# Patient Record
Sex: Male | Born: 1935 | Race: White | Hispanic: No | Marital: Married | Smoking: Never smoker
Health system: Southern US, Community
[De-identification: ages and names within clinical notes are randomized; demographics above are authoritative.]

## PROBLEM LIST (undated history)

## (undated) DIAGNOSIS — N400 Enlarged prostate without lower urinary tract symptoms: Secondary | ICD-10-CM

## (undated) DIAGNOSIS — K219 Gastro-esophageal reflux disease without esophagitis: Secondary | ICD-10-CM

## (undated) DIAGNOSIS — E785 Hyperlipidemia, unspecified: Secondary | ICD-10-CM

## (undated) DIAGNOSIS — R600 Localized edema: Secondary | ICD-10-CM

## (undated) DIAGNOSIS — Z889 Allergy status to unspecified drugs, medicaments and biological substances status: Secondary | ICD-10-CM

## (undated) DIAGNOSIS — M419 Scoliosis, unspecified: Secondary | ICD-10-CM

## (undated) DIAGNOSIS — I10 Essential (primary) hypertension: Secondary | ICD-10-CM

## (undated) DIAGNOSIS — R609 Edema, unspecified: Secondary | ICD-10-CM

## (undated) DIAGNOSIS — G4700 Insomnia, unspecified: Secondary | ICD-10-CM

## (undated) DIAGNOSIS — I509 Heart failure, unspecified: Secondary | ICD-10-CM

## (undated) DIAGNOSIS — E039 Hypothyroidism, unspecified: Secondary | ICD-10-CM

## (undated) DIAGNOSIS — F419 Anxiety disorder, unspecified: Secondary | ICD-10-CM

## (undated) DIAGNOSIS — C801 Malignant (primary) neoplasm, unspecified: Secondary | ICD-10-CM

## (undated) DIAGNOSIS — M549 Dorsalgia, unspecified: Secondary | ICD-10-CM

## (undated) DIAGNOSIS — F0390 Unspecified dementia without behavioral disturbance: Secondary | ICD-10-CM

## (undated) DIAGNOSIS — Z22322 Carrier or suspected carrier of Methicillin resistant Staphylococcus aureus: Secondary | ICD-10-CM

## (undated) HISTORY — PX: OTHER SURGICAL HISTORY: SHX169

## (undated) HISTORY — DX: Heart failure, unspecified: I50.9

## (undated) HISTORY — PX: TRANSURETHRAL RESECTION OF PROSTATE: SHX73

## (undated) HISTORY — PX: LUMBAR DISC SURGERY: SHX700

## (undated) HISTORY — DX: Unspecified dementia, unspecified severity, without behavioral disturbance, psychotic disturbance, mood disturbance, and anxiety: F03.90

## (undated) HISTORY — DX: Malignant (primary) neoplasm, unspecified: C80.1

## (undated) HISTORY — DX: Carrier or suspected carrier of methicillin resistant Staphylococcus aureus: Z22.322

## (undated) HISTORY — PX: TOE SURGERY: SHX1073

## (undated) HISTORY — PX: HERNIA REPAIR: SHX51

## (undated) HISTORY — DX: Dorsalgia, unspecified: M54.9

## (undated) HISTORY — PX: COLONOSCOPY: SHX174

## (undated) HISTORY — PX: CATARACT EXTRACTION: SUR2

## (undated) HISTORY — DX: Scoliosis, unspecified: M41.9

## (undated) HISTORY — DX: Hyperlipidemia, unspecified: E78.5

## (undated) HISTORY — PX: CERVICAL DISC SURGERY: SHX588

## (undated) HISTORY — PX: ESOPHAGOGASTRODUODENOSCOPY: SHX1529

---

## 1998-01-16 ENCOUNTER — Other Ambulatory Visit: Admission: RE | Admit: 1998-01-16 | Discharge: 1998-01-16 | Payer: Self-pay | Admitting: *Deleted

## 1999-06-12 ENCOUNTER — Encounter: Payer: Self-pay | Admitting: Neurological Surgery

## 1999-06-12 ENCOUNTER — Ambulatory Visit (HOSPITAL_COMMUNITY): Admission: RE | Admit: 1999-06-12 | Discharge: 1999-06-12 | Payer: Self-pay | Admitting: Neurological Surgery

## 1999-07-17 ENCOUNTER — Inpatient Hospital Stay (HOSPITAL_COMMUNITY): Admission: RE | Admit: 1999-07-17 | Discharge: 1999-07-21 | Payer: Self-pay | Admitting: Neurological Surgery

## 1999-07-17 ENCOUNTER — Encounter: Payer: Self-pay | Admitting: Neurological Surgery

## 1999-07-19 ENCOUNTER — Encounter: Payer: Self-pay | Admitting: Neurological Surgery

## 1999-08-05 ENCOUNTER — Encounter: Payer: Self-pay | Admitting: Neurological Surgery

## 1999-08-05 ENCOUNTER — Encounter: Admission: RE | Admit: 1999-08-05 | Discharge: 1999-08-05 | Payer: Self-pay | Admitting: Neurological Surgery

## 1999-08-06 ENCOUNTER — Encounter: Admission: RE | Admit: 1999-08-06 | Discharge: 1999-08-06 | Payer: Self-pay | Admitting: Neurological Surgery

## 1999-08-06 ENCOUNTER — Encounter: Payer: Self-pay | Admitting: Neurological Surgery

## 1999-08-12 ENCOUNTER — Encounter: Payer: Self-pay | Admitting: Neurological Surgery

## 1999-08-12 ENCOUNTER — Encounter: Admission: RE | Admit: 1999-08-12 | Discharge: 1999-08-12 | Payer: Self-pay | Admitting: Neurological Surgery

## 1999-08-20 ENCOUNTER — Encounter: Payer: Self-pay | Admitting: Neurological Surgery

## 1999-08-20 ENCOUNTER — Encounter: Admission: RE | Admit: 1999-08-20 | Discharge: 1999-08-20 | Payer: Self-pay | Admitting: Neurological Surgery

## 1999-09-10 ENCOUNTER — Encounter: Admission: RE | Admit: 1999-09-10 | Discharge: 1999-09-10 | Payer: Self-pay | Admitting: Neurological Surgery

## 1999-09-10 ENCOUNTER — Encounter: Payer: Self-pay | Admitting: Neurological Surgery

## 1999-10-15 ENCOUNTER — Encounter: Payer: Self-pay | Admitting: Neurological Surgery

## 1999-10-15 ENCOUNTER — Encounter: Admission: RE | Admit: 1999-10-15 | Discharge: 1999-10-15 | Payer: Self-pay | Admitting: Neurological Surgery

## 1999-11-03 ENCOUNTER — Encounter (INDEPENDENT_AMBULATORY_CARE_PROVIDER_SITE_OTHER): Payer: Self-pay | Admitting: Specialist

## 1999-11-03 ENCOUNTER — Ambulatory Visit (HOSPITAL_BASED_OUTPATIENT_CLINIC_OR_DEPARTMENT_OTHER): Admission: RE | Admit: 1999-11-03 | Discharge: 1999-11-03 | Payer: Self-pay

## 2000-01-26 ENCOUNTER — Encounter: Payer: Self-pay | Admitting: *Deleted

## 2000-01-26 ENCOUNTER — Encounter: Admission: RE | Admit: 2000-01-26 | Discharge: 2000-01-26 | Payer: Self-pay | Admitting: *Deleted

## 2000-01-29 ENCOUNTER — Encounter: Payer: Self-pay | Admitting: Neurological Surgery

## 2000-01-29 ENCOUNTER — Ambulatory Visit (HOSPITAL_COMMUNITY): Admission: RE | Admit: 2000-01-29 | Discharge: 2000-01-29 | Payer: Self-pay | Admitting: Neurological Surgery

## 2000-02-10 ENCOUNTER — Encounter: Payer: Self-pay | Admitting: Neurological Surgery

## 2000-02-10 ENCOUNTER — Ambulatory Visit (HOSPITAL_COMMUNITY): Admission: RE | Admit: 2000-02-10 | Discharge: 2000-02-10 | Payer: Self-pay | Admitting: Neurological Surgery

## 2000-02-17 ENCOUNTER — Encounter: Payer: Self-pay | Admitting: Neurological Surgery

## 2000-02-17 ENCOUNTER — Ambulatory Visit (HOSPITAL_COMMUNITY): Admission: RE | Admit: 2000-02-17 | Discharge: 2000-02-17 | Payer: Self-pay | Admitting: Neurological Surgery

## 2000-10-07 ENCOUNTER — Ambulatory Visit (HOSPITAL_COMMUNITY): Admission: RE | Admit: 2000-10-07 | Discharge: 2000-10-07 | Payer: Self-pay | Admitting: Family Medicine

## 2001-02-09 ENCOUNTER — Encounter: Payer: Self-pay | Admitting: Neurological Surgery

## 2001-02-09 ENCOUNTER — Encounter: Admission: RE | Admit: 2001-02-09 | Discharge: 2001-02-09 | Payer: Self-pay | Admitting: Neurological Surgery

## 2001-02-23 ENCOUNTER — Encounter: Admission: RE | Admit: 2001-02-23 | Discharge: 2001-02-23 | Payer: Self-pay | Admitting: Neurological Surgery

## 2001-02-23 ENCOUNTER — Encounter: Payer: Self-pay | Admitting: Neurological Surgery

## 2001-03-10 ENCOUNTER — Encounter: Payer: Self-pay | Admitting: Neurological Surgery

## 2001-03-10 ENCOUNTER — Encounter: Admission: RE | Admit: 2001-03-10 | Discharge: 2001-03-10 | Payer: Self-pay | Admitting: Neurological Surgery

## 2003-02-09 ENCOUNTER — Ambulatory Visit (HOSPITAL_COMMUNITY): Admission: RE | Admit: 2003-02-09 | Discharge: 2003-02-09 | Payer: Self-pay | Admitting: Neurology

## 2003-02-09 ENCOUNTER — Encounter: Payer: Self-pay | Admitting: Neurology

## 2004-08-19 ENCOUNTER — Ambulatory Visit: Payer: Self-pay | Admitting: Family Medicine

## 2004-09-16 ENCOUNTER — Ambulatory Visit: Payer: Self-pay | Admitting: Physical Medicine & Rehabilitation

## 2005-04-01 ENCOUNTER — Ambulatory Visit: Payer: Self-pay | Admitting: Family Medicine

## 2005-12-14 ENCOUNTER — Ambulatory Visit: Payer: Self-pay | Admitting: Family Medicine

## 2005-12-15 ENCOUNTER — Encounter: Payer: Self-pay | Admitting: Family Medicine

## 2005-12-15 LAB — CONVERTED CEMR LAB: PSA: 1.51 ng/mL

## 2005-12-17 ENCOUNTER — Encounter: Admission: RE | Admit: 2005-12-17 | Discharge: 2005-12-17 | Payer: Self-pay | Admitting: Family Medicine

## 2005-12-17 ENCOUNTER — Ambulatory Visit: Payer: Self-pay | Admitting: Family Medicine

## 2005-12-24 ENCOUNTER — Ambulatory Visit: Payer: Self-pay | Admitting: Family Medicine

## 2005-12-31 ENCOUNTER — Ambulatory Visit: Payer: Self-pay | Admitting: Family Medicine

## 2006-01-28 ENCOUNTER — Ambulatory Visit: Payer: Self-pay | Admitting: Family Medicine

## 2006-02-18 ENCOUNTER — Ambulatory Visit: Payer: Self-pay | Admitting: Family Medicine

## 2006-07-13 ENCOUNTER — Ambulatory Visit: Payer: Self-pay | Admitting: Family Medicine

## 2006-07-27 ENCOUNTER — Ambulatory Visit: Payer: Self-pay | Admitting: Family Medicine

## 2006-07-27 LAB — CONVERTED CEMR LAB
Creatinine, Ser: 1.3 mg/dL (ref 0.4–1.5)
Potassium: 4 meq/L (ref 3.5–5.1)
Uric Acid, Serum: 8.2 mg/dL — ABNORMAL HIGH (ref 2.4–7.0)

## 2006-08-30 ENCOUNTER — Ambulatory Visit: Payer: Self-pay | Admitting: Family Medicine

## 2006-09-13 ENCOUNTER — Ambulatory Visit: Payer: Self-pay | Admitting: Family Medicine

## 2007-04-07 ENCOUNTER — Encounter: Payer: Self-pay | Admitting: Family Medicine

## 2007-04-07 DIAGNOSIS — E785 Hyperlipidemia, unspecified: Secondary | ICD-10-CM

## 2007-04-07 DIAGNOSIS — I10 Essential (primary) hypertension: Secondary | ICD-10-CM

## 2007-04-07 DIAGNOSIS — E039 Hypothyroidism, unspecified: Secondary | ICD-10-CM

## 2007-04-07 DIAGNOSIS — I509 Heart failure, unspecified: Secondary | ICD-10-CM

## 2007-08-02 ENCOUNTER — Ambulatory Visit: Payer: Self-pay | Admitting: Family Medicine

## 2007-08-02 DIAGNOSIS — R413 Other amnesia: Secondary | ICD-10-CM

## 2007-08-02 LAB — CONVERTED CEMR LAB
Albumin: 4 g/dL (ref 3.5–5.2)
Alkaline Phosphatase: 63 units/L (ref 39–117)
Basophils Relative: 0.4 % (ref 0.0–1.0)
CO2: 29 meq/L (ref 19–32)
GFR calc Af Amer: 83 mL/min
HCT: 45.8 % (ref 39.0–52.0)
Hemoglobin: 15.9 g/dL (ref 13.0–17.0)
Lymphocytes Relative: 36.2 % (ref 12.0–46.0)
MCHC: 34.8 g/dL (ref 30.0–36.0)
MCV: 89 fL (ref 78.0–100.0)
Monocytes Relative: 6.6 % (ref 3.0–11.0)
Platelets: 217 10*3/uL (ref 150–400)
Potassium: 4.5 meq/L (ref 3.5–5.1)
RDW: 12.3 % (ref 11.5–14.6)
TSH: 0.84 microintl units/mL (ref 0.35–5.50)
Total Bilirubin: 1.4 mg/dL — ABNORMAL HIGH (ref 0.3–1.2)
WBC: 8 10*3/uL (ref 4.5–10.5)

## 2007-08-11 ENCOUNTER — Ambulatory Visit: Payer: Self-pay | Admitting: Family Medicine

## 2007-08-11 DIAGNOSIS — L259 Unspecified contact dermatitis, unspecified cause: Secondary | ICD-10-CM

## 2007-08-30 ENCOUNTER — Ambulatory Visit: Payer: Self-pay | Admitting: Family Medicine

## 2008-02-10 ENCOUNTER — Ambulatory Visit: Payer: Self-pay | Admitting: Family Medicine

## 2008-02-16 ENCOUNTER — Telehealth (INDEPENDENT_AMBULATORY_CARE_PROVIDER_SITE_OTHER): Payer: Self-pay | Admitting: *Deleted

## 2008-03-13 ENCOUNTER — Ambulatory Visit: Payer: Self-pay | Admitting: Family Medicine

## 2008-04-23 ENCOUNTER — Ambulatory Visit: Payer: Self-pay | Admitting: Family Medicine

## 2008-06-25 ENCOUNTER — Ambulatory Visit: Payer: Self-pay | Admitting: Family Medicine

## 2008-08-21 ENCOUNTER — Ambulatory Visit: Payer: Self-pay | Admitting: Family Medicine

## 2008-08-22 LAB — CONVERTED CEMR LAB
ALT: 28 units/L (ref 0–53)
AST: 24 units/L (ref 0–37)
Albumin: 3.8 g/dL (ref 3.5–5.2)
Alkaline Phosphatase: 57 units/L (ref 39–117)
Basophils Absolute: 0 10*3/uL (ref 0.0–0.1)
Basophils Relative: 0.6 % (ref 0.0–3.0)
Eosinophils Relative: 1.5 % (ref 0.0–5.0)
GFR calc Af Amer: 83 mL/min
GFR calc non Af Amer: 69 mL/min
HCT: 45.1 % (ref 39.0–52.0)
Hemoglobin: 15.7 g/dL (ref 13.0–17.0)
LDL Cholesterol: 135 mg/dL — ABNORMAL HIGH (ref 0–99)
Monocytes Absolute: 0.5 10*3/uL (ref 0.1–1.0)
Neutrophils Relative %: 56.2 % (ref 43.0–77.0)
RBC: 4.91 M/uL (ref 4.22–5.81)
Sodium: 145 meq/L (ref 135–145)
TSH: 0.46 microintl units/mL (ref 0.35–5.50)
Triglycerides: 157 mg/dL — ABNORMAL HIGH (ref 0–149)
WBC: 6.4 10*3/uL (ref 4.5–10.5)

## 2008-11-15 DIAGNOSIS — J069 Acute upper respiratory infection, unspecified: Secondary | ICD-10-CM | POA: Insufficient documentation

## 2008-11-22 ENCOUNTER — Ambulatory Visit: Payer: Self-pay | Admitting: Family Medicine

## 2008-11-30 ENCOUNTER — Ambulatory Visit: Payer: Self-pay | Admitting: Family Medicine

## 2008-12-10 ENCOUNTER — Ambulatory Visit: Payer: Self-pay | Admitting: Family Medicine

## 2008-12-10 DIAGNOSIS — J309 Allergic rhinitis, unspecified: Secondary | ICD-10-CM | POA: Insufficient documentation

## 2009-01-08 ENCOUNTER — Ambulatory Visit: Payer: Self-pay | Admitting: Family Medicine

## 2009-09-27 ENCOUNTER — Ambulatory Visit: Payer: Self-pay | Admitting: Family Medicine

## 2010-12-13 ENCOUNTER — Other Ambulatory Visit: Payer: Self-pay | Admitting: Family Medicine

## 2011-01-30 ENCOUNTER — Other Ambulatory Visit: Payer: Self-pay | Admitting: Family Medicine

## 2011-02-27 NOTE — Op Note (Signed)
[place]. [facility]  Patient:    Wayne [name]                         MRN: [id number] Proc. Date: 11/03/99 Adm. Date:  [id number] Disc. Date: [id number] Attending:  Harriet Masson                           Operative Report  PREOPERATIVE DIAGNOSIS:  Hallux rigidus/degenerative arthritis - first metatarsal phalangeal joint right foot.  POSTOPERATIVE DIAGNOSIS:  Hallux rigidus/degenerative arthritis - first metatarsal phalangeal joint right foot.  OPERATIVE PROCEDURE:  Wayne [name] bunionectomy with implant arthroplasty ______ Bioaction implant first metatarsal phalangeal joint right foot.  SURGEON:  Harriet Masson, D.P.M.  INDICATIONS:  Patient has a several year history of increasing pain, tenderness in severity and limitation of motion of the first MPJ right.  The patient has sketchy history of injury or trauma to the area.  There was limited motion of the first  MPJ. X-rays confirm asymmetric joint space narrowing, periarticular spurring, absence of clinical or radiographic mobility and at patient request at this time, surgical intervention was carried out in hopes of relieving pain and symptoms and improving motion at the first MPJ right foot.  There were no contraindications o surgery noted at the present time.  ANESTHESIA:  MAC with local anesthetic administered total of approximately 10 cc, 50/50 mixture of 2% Xylocaine plain and 0.5% Marcaine plain in a nail block fashion.  HEMOSTASIS:  Right ankle tourniquet at 250 mmHg.  Intraoperatively, the tourniquet apparently released or was relaxed, possibly loose around the ankle and hemostasis was lost approximately 2/3 of way prior to closure and the procedure was completed without hemostasis for the last approximately 10 minutes.  FINDINGS OF PROCEDURE:   The patient was brought to the OR and placed on the table in a supine position.  IV sedation was established, the foot was then  prepped and draped in usual aseptic manner.  Ankle tourniquet placed above the malleoli and  padded well for ______.  The foot was exsanguinated with Esmarch wrap and ankle  tourniquet inflated to 250 mmHg.  The following procedure was then carried out.   Keller bunionectomy with implant right foot first MPJ.  Attention was directed overlying the first metatarsal phalangeal joint right foot, where approximately a 6-7 cm linear incision was carried out just medial to and paralleling longus tensor tendon.  The incision was deepened using sharp and blunt dissection along the capsular structures.  At that point, linear capsular incision was carried out the entire length of the original incisions.  Capsular tissues were reflected medially and laterally.  There was a considerable amount of hypertrophied bone in joint mice or ossicle was identified in and around the periarticular area of the first MPJ area.  These were resected from the site.  All bone specimens were submitted in formalin for pathology.  At this time, the joint was disarticulated.  There was  almost complete absence of articular surface, both in the phalangeal base and the metatarsal head.  At this time, utilizing power instrumentation, the metatarsal  head was resected perpendicular to its shaft.  All rough edges were smoothed. t this time, attention was directed to the base of the proximal phalanx, which was also resected perpendicular to its shaft.  Any hypertrophied or bone spicules were resected with rongeur.  The metatarsal head and the phalangeal base were remodeled appropriately.  At this time utilizing appropriate sizers and templates. Templates demonstrated that a large metatarsal head and large phalangeal modified implant  would be appropriate.  At this time, utilizing the supplied reamers with the Bioaction implant set and rotary osteotome or bur, the medullary canals were each appropriately reamed  at the metatarsal head and at the phalangeal base aspects.  Temporary sizers were installed and noted to fit adequately at this time. Large neutral metatarsal implant was applied to the metatarsal head and a large modified phalangeal base was also applied to the phalanx.  All the surrounding hypertrophied bone was resected.  McGlamary elevators were utilized to mobilize the sesamoids, which were also noted to be adhered intraoperatively.  Prior to closure, the site was lavaged with copious amounts of sterile antibiotic solution and cleared of ll osseus and soft tissue debris.  Fluoroscopy pictures were taken to verify positions of the implant and range of motion, which was noted to be adequate, approximately 75 degrees dorsiflexion was available on the operative table and 15-20 degrees plantar flexion upon closure.  Dry, sterile compressive dressing was applied.  should note that the last few minutes of closure were done with an apparently released or drop in tourniquet pressure or at least loosening of the tourniquet  around the patients ankle.  No other complications were noted at this time and closure was accomplished utilizing 3-0 Vicryl to reapproximate capsular tissues, 4-0 Vicryl to reapproximate subcu tissues and skin reapproximated with 5-0 Vicryl in a subcuticular fashion.  Upon completion, the site was infiltrated with 1 cc of dexamethasone phosphate, Betadine/saline soaked sponge and dry, sterile compressive dressing was applied to the right foot.  At that time, the ankle tourniquet, although already deflated, tourniquet was completely deflated, patient had capillary refill.  Time was already noted intraoperatively and at this time, the patient was returned from the OR to recovery in satisfactory condition with vital signs stable and capillary refill time immediate.  Patient was discharged from recovery per anesthesia with oral and written postoperative  instructions, prescriptions for pain medication and appointment for follow-up office visit having been made.  Patient will be in a Darco shoe with moderate ambulation, no other restrictions noted at this time.  Patient will be followed in the [facility]  within one weeks time for postoperative check and dressing changes. DD:  12/25/99 TD:  12/25/99 Job: [id number]

## 2011-06-19 ENCOUNTER — Ambulatory Visit (INDEPENDENT_AMBULATORY_CARE_PROVIDER_SITE_OTHER): Payer: Medicare Other | Admitting: Family Medicine

## 2011-06-19 ENCOUNTER — Encounter: Payer: Self-pay | Admitting: Family Medicine

## 2011-06-19 ENCOUNTER — Other Ambulatory Visit: Payer: Self-pay | Admitting: Family Medicine

## 2011-06-19 ENCOUNTER — Ambulatory Visit: Payer: Self-pay | Admitting: Family Medicine

## 2011-06-19 DIAGNOSIS — E039 Hypothyroidism, unspecified: Secondary | ICD-10-CM

## 2011-06-19 DIAGNOSIS — F09 Unspecified mental disorder due to known physiological condition: Secondary | ICD-10-CM

## 2011-06-19 DIAGNOSIS — Z0000 Encounter for general adult medical examination without abnormal findings: Secondary | ICD-10-CM

## 2011-06-19 DIAGNOSIS — R5383 Other fatigue: Secondary | ICD-10-CM

## 2011-06-19 DIAGNOSIS — I10 Essential (primary) hypertension: Secondary | ICD-10-CM

## 2011-06-19 DIAGNOSIS — N400 Enlarged prostate without lower urinary tract symptoms: Secondary | ICD-10-CM

## 2011-06-19 DIAGNOSIS — R4189 Other symptoms and signs involving cognitive functions and awareness: Secondary | ICD-10-CM

## 2011-06-19 DIAGNOSIS — E785 Hyperlipidemia, unspecified: Secondary | ICD-10-CM

## 2011-06-19 LAB — LIPID PANEL
Cholesterol: 199 mg/dL (ref 0–200)
Total CHOL/HDL Ratio: 5
VLDL: 67.4 mg/dL — ABNORMAL HIGH (ref 0.0–40.0)

## 2011-06-19 LAB — BASIC METABOLIC PANEL
Chloride: 105 mEq/L (ref 96–112)
Glucose, Bld: 98 mg/dL (ref 70–99)
Sodium: 140 mEq/L (ref 135–145)

## 2011-06-19 LAB — CBC WITH DIFFERENTIAL/PLATELET
Basophils Relative: 0.6 % (ref 0.0–3.0)
Eosinophils Absolute: 0.2 10*3/uL (ref 0.0–0.7)
Lymphocytes Relative: 28.2 % (ref 12.0–46.0)
MCV: 92.3 fl (ref 78.0–100.0)
Monocytes Relative: 7.5 % (ref 3.0–12.0)
Neutrophils Relative %: 61.7 % (ref 43.0–77.0)
Platelets: 230 10*3/uL (ref 150.0–400.0)

## 2011-06-19 LAB — HEPATIC FUNCTION PANEL
Albumin: 4.1 g/dL (ref 3.5–5.2)
Alkaline Phosphatase: 54 U/L (ref 39–117)
Bilirubin, Direct: 0 mg/dL (ref 0.0–0.3)
Total Protein: 7.2 g/dL (ref 6.0–8.3)

## 2011-06-19 LAB — TSH: TSH: 13.81 u[IU]/mL — ABNORMAL HIGH (ref 0.35–5.50)

## 2011-06-19 LAB — LDL CHOLESTEROL, DIRECT: Direct LDL: 114.8 mg/dL

## 2011-06-19 NOTE — Progress Notes (Signed)
  Subjective:    Patient ID: Wayne [name], male    DOB: Sep 11, 1926, 77 y.o.   MRN: [id number]  HPI Seen with nonspecific symptoms of not feeling well past few weeks. Some recent increased edema ankles. He has some chronic edema. No history of heart failure. Also has chronic back pain and has had multiple surgeries previously and has had some recent flareup. He is scheduled to see Dr. Ellene Route. He is taking Aricept for some memory issues but thought this was causing constipation. The constipation has persisted. He complains of decreased energy, peripheral edema and constipation as well as increased fatigue. In reviewing his medications, he stopped his thyroid medication for unknown reasons several months ago. He has not had any lab work in quite some time. Denies any recent chest pain. No appetite changes. Chronic prostate issues and does in and out catheter occasionally. No dysuria.   Review of Systems  Constitutional: Positive for fatigue. Negative for fever, chills and appetite change.  Eyes: Negative for visual disturbance.  Respiratory: Negative for cough, shortness of breath and wheezing.   Cardiovascular: Positive for leg swelling. Negative for chest pain and palpitations.  Gastrointestinal: Negative for abdominal pain and blood in stool.  Genitourinary: Negative for hematuria.  Neurological: Positive for weakness. Negative for syncope and headaches.  Hematological: Negative for adenopathy. Does not bruise/bleed easily.       Objective:   Physical Exam  Constitutional: He is oriented to person, place, and time. He appears well-developed and well-nourished. No distress.  HENT:  Head: Normocephalic and atraumatic.  Nose: Nose normal.  Mouth/Throat: Oropharynx is clear and moist.  Neck: Neck supple.  Cardiovascular: Normal rate and regular rhythm.   Pulmonary/Chest: Effort normal and breath sounds normal. No respiratory distress. He has no wheezes. He has no rales.  Musculoskeletal:       Trace to 1+ pitting edema legs bilaterally  Lymphadenopathy:    He has no cervical adenopathy.  Neurological: He is alert and oriented to person, place, and time.          Assessment & Plan:  #1 Fatigue. Probably multifactorial. Possibly related to not taking thyroid medication #2 hypothyroidism. Poor compliance. Recheck TSH. This may explain several his symptoms above #3 hyperlipidemia. Check lipids and hepatic panel #4 hypertension. Elevated today somewhat. Followup with primary reassess in 2 weeks

## 2011-06-24 ENCOUNTER — Telehealth: Payer: Self-pay | Admitting: Family Medicine

## 2011-06-24 MED ORDER — LEVOTHYROXINE SODIUM 50 MCG PO TABS: 50.0000 ug | ORAL_TABLET | Freq: Every day | ORAL | Status: DC

## 2011-06-24 NOTE — Telephone Encounter (Signed)
patient  Is aware of lab results.  rx sent to pharmacy

## 2011-06-24 NOTE — Telephone Encounter (Signed)
Pt had bloodwork on 9-7 requesting results.

## 2011-06-29 ENCOUNTER — Ambulatory Visit (INDEPENDENT_AMBULATORY_CARE_PROVIDER_SITE_OTHER): Payer: Medicare Other | Admitting: Family Medicine

## 2011-06-29 ENCOUNTER — Encounter: Payer: Self-pay | Admitting: Family Medicine

## 2011-06-29 VITALS — BP 180/98 | Temp 98.2°F | Wt 204.0 lb

## 2011-06-29 DIAGNOSIS — N400 Enlarged prostate without lower urinary tract symptoms: Secondary | ICD-10-CM

## 2011-06-29 DIAGNOSIS — Z23 Encounter for immunization: Secondary | ICD-10-CM

## 2011-06-29 DIAGNOSIS — F068 Other specified mental disorders due to known physiological condition: Secondary | ICD-10-CM

## 2011-06-29 DIAGNOSIS — I10 Essential (primary) hypertension: Secondary | ICD-10-CM

## 2011-06-29 DIAGNOSIS — E039 Hypothyroidism, unspecified: Secondary | ICD-10-CM

## 2011-06-29 DIAGNOSIS — E785 Hyperlipidemia, unspecified: Secondary | ICD-10-CM

## 2011-06-29 DIAGNOSIS — R4189 Other symptoms and signs involving cognitive functions and awareness: Secondary | ICD-10-CM

## 2011-06-29 MED ORDER — DONEPEZIL HCL 5 MG PO TABS: 5.0000 mg | ORAL_TABLET | Freq: Every evening | ORAL | Status: DC | PRN

## 2011-06-29 MED ORDER — ATENOLOL 100 MG PO TABS: 100.0000 mg | ORAL_TABLET | Freq: Every day | ORAL | Status: DC

## 2011-06-29 MED ORDER — CIPROFLOXACIN HCL 500 MG PO TABS: 500.0000 mg | ORAL_TABLET | Freq: Every day | ORAL | Status: AC

## 2011-06-29 MED ORDER — DOXAZOSIN MESYLATE 4 MG PO TABS: 4.0000 mg | ORAL_TABLET | Freq: Every day | ORAL | Status: DC

## 2011-06-29 MED ORDER — TORSEMIDE 20 MG PO TABS: 20.0000 mg | ORAL_TABLET | Freq: Every day | ORAL | Status: DC

## 2011-06-29 NOTE — Progress Notes (Signed)
  Subjective:    Patient ID: Wayne [name], male    DOB: 02/02/1926, 77 y.o.   MRN: [id number]  Wayne [name] is an 77 year old male, married, nonsmoker, who comes in today accompanied by his wife for reevaluation of hypertension, and hypothyroidism, and dementia.  He stopped his Aricept because of constipation.  He saw Dr. Elease Hashimoto last week. TSH was elevated because he had stopped his thyroid medication.  He restarted the Synthroid week ago.  He also is not taking his blood pressure medication.  He states he takes the Tenormin once a day, but is not taken the Cardura, nor the Demadex.  BP today 180/98    Review of Systems    General and cardiovascular his systems otherwise negative Objective:   Physical Exam  Well-developed male, accompanied by his wife.  No acute distress.  BP 180/98 right arm sitting position      Assessment & Plan:  Hypothyroidism continue Synthroid one daily.  Hypertension.  Restart medication.  Dementia.  Restart medication.  I advised and he agreed that from now on his wife.  Will give him his medication because he can no longer remember to take it on a daily basis.  Blood pressure and weight check daily.  Return in two weeks

## 2011-06-29 NOTE — Patient Instructions (Signed)
In the morning, one aspirin tablet,.......Marland Kitchen 100 mg Tenoretic tablet, .....5.0 mg  Thyroid , tablet,..................... 1...Marland Kitchen20 mg, Demadex tablet.  At bedtime............ one Aricept 5 mg,.......... one Cardura 4 mg/............, and the 40 mg, simvastatin.  The next 3 days.  Increase the Demadex to one in the morning, one at noon.  Check a blood pressure and weight daily in the morning.  Return in two weeks for follow-up with the data

## 2011-07-09 ENCOUNTER — Other Ambulatory Visit (HOSPITAL_COMMUNITY): Payer: Self-pay | Admitting: Neurological Surgery

## 2011-07-09 DIAGNOSIS — M4306 Spondylolysis, lumbar region: Secondary | ICD-10-CM

## 2011-07-13 ENCOUNTER — Ambulatory Visit (INDEPENDENT_AMBULATORY_CARE_PROVIDER_SITE_OTHER): Payer: Medicare Other | Admitting: Family Medicine

## 2011-07-13 ENCOUNTER — Encounter: Payer: Self-pay | Admitting: Family Medicine

## 2011-07-13 DIAGNOSIS — E039 Hypothyroidism, unspecified: Secondary | ICD-10-CM

## 2011-07-13 DIAGNOSIS — I10 Essential (primary) hypertension: Secondary | ICD-10-CM

## 2011-07-13 LAB — BASIC METABOLIC PANEL
CO2: 25 mEq/L (ref 19–32)
GFR: 66.57 mL/min (ref >60.00)
Potassium: 5.2 mEq/L — ABNORMAL HIGH (ref 3.5–5.1)
Sodium: 141 mEq/L (ref 135–145)

## 2011-07-13 NOTE — Patient Instructions (Signed)
Double the Demadex by taking one in the morning, and another tablet noon.  Continue on a salt free diet and  stockings.  Return in two weeks for follow-up, sooner if any problems.  Check your blood pressure daily once in the morning, along with a daily weight at home

## 2011-07-13 NOTE — Progress Notes (Signed)
  Subjective:    Patient ID: Wayne [name], male    DOB: 1926/02/12, 77 y.o.   MRN: [id number]  HPI Wayne [name] is a DVT-year-old, married male, nonsmoker, who comes in today for follow-up of hypertension, and hypothyroidism.  He is currently taking 50 mcg of Synthroid daily.  He still has issues with constipation.  Will check thyroid level today.  His wife.  It has been faithful about giving him his Demadex 20 mg daily.  His weight is dropped 4 pounds in two weeks however, his legs are still swollen.  He has chest pain or shortness of breath.  He is to to undergo a myelogram.  Next Monday.  He saw his neurosurgeon, Dr. Ellene Route.   Review of Systems General and cardiovascular view systems otherwise negative   Objective:   Physical Exam Well-developed well-nourished, male in no acute distress.  The legs show 2+ edema, however, he says when he got out of bed.  This morning.  There is legs are not swollen at all       Assessment & Plan:  Hypertension blood pressure now 130/88.  Continue current medications, but increase Demadex to one twice daily.  History of hypothyroidism.  Check TSH level

## 2011-07-15 ENCOUNTER — Telehealth: Payer: Self-pay | Admitting: *Deleted

## 2011-07-15 MED ORDER — LEVOTHYROXINE SODIUM 200 MCG PO TABS: 200.0000 ug | ORAL_TABLET | Freq: Every day | ORAL | Status: DC

## 2011-07-15 NOTE — Telephone Encounter (Signed)
Message copied by Lamarr Lulas on Wed Jul 15, 2011  2:26 PM ------      Message from: TODD, JEFFREY A      Created: Mon Jul 13, 2011  5:08 PM       Please call his wife Jenny Reichmann tomorrow...........Marland Kitchen Double the thyroid dose to 250 mcg tabs daily, and call in a new prescription for the hundred microgram tablets, number 101 daily refills x 3

## 2011-07-20 ENCOUNTER — Ambulatory Visit (HOSPITAL_COMMUNITY)
Admission: RE | Admit: 2011-07-20 | Discharge: 2011-07-20 | Disposition: A | Discharge status: Home / Self Care | Payer: Medicare Other | Source: Ambulatory Visit | Attending: Neurological Surgery | Admitting: Neurological Surgery

## 2011-07-20 DIAGNOSIS — M47817 Spondylosis without myelopathy or radiculopathy, lumbosacral region: Secondary | ICD-10-CM | POA: Insufficient documentation

## 2011-07-20 DIAGNOSIS — R209 Unspecified disturbances of skin sensation: Secondary | ICD-10-CM | POA: Insufficient documentation

## 2011-07-20 DIAGNOSIS — IMO0002 Reserved for concepts with insufficient information to code with codable children: Secondary | ICD-10-CM | POA: Insufficient documentation

## 2011-07-20 DIAGNOSIS — M4306 Spondylolysis, lumbar region: Secondary | ICD-10-CM

## 2011-07-20 MED ORDER — IOHEXOL 180 MG/ML  SOLN
20.0000 mL | Freq: Once | INTRAMUSCULAR | Status: AC | PRN
  Administered 2011-07-20: 20 mL via INTRATHECAL

## 2011-07-27 ENCOUNTER — Ambulatory Visit (INDEPENDENT_AMBULATORY_CARE_PROVIDER_SITE_OTHER): Payer: Medicare Other | Admitting: Family Medicine

## 2011-07-27 ENCOUNTER — Encounter: Payer: Self-pay | Admitting: Family Medicine

## 2011-07-27 DIAGNOSIS — I10 Essential (primary) hypertension: Secondary | ICD-10-CM

## 2011-07-27 DIAGNOSIS — I509 Heart failure, unspecified: Secondary | ICD-10-CM

## 2011-07-27 DIAGNOSIS — I872 Venous insufficiency (chronic) (peripheral): Secondary | ICD-10-CM

## 2011-07-27 NOTE — Progress Notes (Signed)
  Subjective:    Patient ID: Wayne [name], male    DOB: October 18, 1925, 77 y.o.   MRN: [id number]  HPI Wayne [name] is an 77 year old male, who comes in today accompanied by his wife Wayne [name] for evaluation of congestive heart failure and venous insufficiency.  He states cardiac wise he feels fine.  No chest pain or shortness of breath.  He still has trouble with peripheral edema.  He states in the morning.  His electrolytes look okay wants to get out and walks around.  They start to swell.  I suggested putting stockings on before he gets out of bed.  We also discussed long leg stockings.  He still sits in a recliner, which makes his legs were more however, when he says he lies flat.  He feels his back pain is worse.  He stated her back to see Dr. Ellene Route.  He is supposed to be taking his Demadex twice daily, but he is not.   Review of Systems    General cardiovascular review of systems otherwise negative Objective:   Physical Exam  Well developed, well nourished, male no acute distress.  Examination lower extremity shows 2+ edema bilaterally  Lungs are clear      Assessment & Plan:  Congestive heart failure, stable  Venous insufficiency, slightly improved plan continue no salt diet emphasized taking D. Demadex twice daily and wearing the stockings daily.  Follow-up in 4 weeks

## 2011-07-27 NOTE — Patient Instructions (Signed)
Continue your current medications.  Take the Demadex, one in the morning, and a second dose at noon.  Return in one month, sooner if any problems.  Put on the compression stockings before you get out of bed in the morning

## 2011-08-24 ENCOUNTER — Encounter: Payer: Self-pay | Admitting: Family Medicine

## 2011-08-24 ENCOUNTER — Ambulatory Visit (INDEPENDENT_AMBULATORY_CARE_PROVIDER_SITE_OTHER): Payer: Medicare Other | Admitting: Family Medicine

## 2011-08-24 ENCOUNTER — Encounter: Payer: Self-pay | Admitting: *Deleted

## 2011-08-24 DIAGNOSIS — I872 Venous insufficiency (chronic) (peripheral): Secondary | ICD-10-CM

## 2011-08-24 DIAGNOSIS — I509 Heart failure, unspecified: Secondary | ICD-10-CM

## 2011-08-24 NOTE — Progress Notes (Signed)
  Subjective:    Patient ID: Wayne [name], male    DOB: 1926-10-10, 77 y.o.   MRN: [id number]  HPIFred is an 77 year old male, who comes in today for evaluation of venous insufficiency and congestive heart failure.  He states that cardiac wise he feels fine is no chest pain.  No shortness of breath.  He continues to be troubled with venous insufficiency.  He states in the morning, when he gets out of bed.  There is no swelling by midafternoon of swallowing.  He continues to wear inappropriate stockings.  Ankle high.  I again advised him to wear the full leg stockings.  He finally has agreed to try.    Review of Systems    General and cardiovascular review of systems otherwise negative Objective:   Physical Exam Well-developed well-nourished, male in no acute distress.  Heart and lungs are normal 2+ lower right leg peripheral edema       Assessment & Plan:  Congestive heart failure, stable.  Venous insufficiency.  Plan continue diuretic to b.i.d. Add full leg stockings.  Return in one month

## 2011-08-24 NOTE — Patient Instructions (Signed)
Get fitted for the full leg or the pantyhose stockings.  Continue your current medications.  Follow-up in one month

## 2011-08-31 ENCOUNTER — Ambulatory Visit: Payer: Medicare Other | Admitting: Family Medicine

## 2011-09-28 ENCOUNTER — Ambulatory Visit: Payer: Medicare Other | Admitting: Family Medicine

## 2011-10-07 ENCOUNTER — Encounter: Payer: Self-pay | Admitting: Family Medicine

## 2011-10-07 ENCOUNTER — Ambulatory Visit (INDEPENDENT_AMBULATORY_CARE_PROVIDER_SITE_OTHER): Payer: Medicare Other | Admitting: Family Medicine

## 2011-10-07 VITALS — BP 140/90 | Temp 98.2°F | Wt 191.0 lb

## 2011-10-07 DIAGNOSIS — L0291 Cutaneous abscess, unspecified: Secondary | ICD-10-CM

## 2011-10-07 DIAGNOSIS — L02211 Cutaneous abscess of abdominal wall: Secondary | ICD-10-CM | POA: Insufficient documentation

## 2011-10-07 MED ORDER — SULFAMETHOXAZOLE-TRIMETHOPRIM 800-160 MG PO TABS: 1.0000 | ORAL_TABLET | Freq: Two times a day (BID) | ORAL | Status: AC

## 2011-10-07 MED ORDER — CEFTRIAXONE SODIUM 1 G IJ SOLR
1.0000 g | INTRAMUSCULAR | Status: AC
  Administered 2011-10-07: 1 g via INTRAMUSCULAR

## 2011-10-07 MED ORDER — DOXYCYCLINE HYCLATE 100 MG PO TABS: 100.0000 mg | ORAL_TABLET | Freq: Two times a day (BID) | ORAL | Status: AC

## 2011-10-07 NOTE — Progress Notes (Signed)
  Subjective:    Patient ID: Wayne [name], male    DOB: 1926/03/17, 77 y.o.   MRN: [id number]  HPI Wayne [name] is the 77 year old male, who comes in today for evaluation of a boil on his abdomen.  He states that last week he noticed a small pimple over the last 3 days has become very large and began draining pus.  He denies any fever, chills.  No recent surgery except for epidural steroid injections for back pain and by Dr. Ellene [name]   Review of Systems    General review of systems otherwise negative Objective:   Physical Exam  Well-developed well-nourished, male in no acute distress.  Examination the abdomen shows a baseball sized the area was cleaned and shaved.  About 10 cc the past was expressed.  Culture was done, and he was given 1 g of Rocephin IM, along with p.o. Doxycycline, and Septra.  Appointment was made Thursday at 315 with Dr. Johney [name], general surgeon, for IND      Assessment & Plan:  Abscess slight lower quadrant, probably Wayne [name] plan see above

## 2011-10-07 NOTE — Patient Instructions (Signed)
Rest at home today and tomorrow.  Keep a heating pad on the Dressing  You have an appointment to see a general surgeon, Dr. Johney Maine at 315 tomorrow.  Their offices on the corner of [address].. [facility].......... Our old office.  Begin doxycycline and Septra, one of each twice daily

## 2011-10-08 ENCOUNTER — Ambulatory Visit (INDEPENDENT_AMBULATORY_CARE_PROVIDER_SITE_OTHER): Payer: Medicare Other | Admitting: Surgery

## 2011-10-08 ENCOUNTER — Encounter (INDEPENDENT_AMBULATORY_CARE_PROVIDER_SITE_OTHER): Payer: Self-pay | Admitting: Surgery

## 2011-10-08 VITALS — BP 128/86 | HR 92 | Temp 97.2°F | Resp 24 | Ht 62.0 in | Wt 189.8 lb

## 2011-10-08 DIAGNOSIS — L02211 Cutaneous abscess of abdominal wall: Secondary | ICD-10-CM

## 2011-10-08 DIAGNOSIS — L03319 Cellulitis of trunk, unspecified: Secondary | ICD-10-CM

## 2011-10-08 DIAGNOSIS — L02219 Cutaneous abscess of trunk, unspecified: Secondary | ICD-10-CM

## 2011-10-08 NOTE — Progress Notes (Signed)
Subjective:     Patient ID: Wayne [name], male   DOB: 02-29-1926, 77 y.o.   MRN: [id number]  HPI  [name]  06/13/1926 [id number]  Patient Care Team: Joycelyn Man as PCP - General  This patient is a 77 y.o.male who presents today for surgical evaluation at the request of Dr. Sherren Mocha.   Patient is a pleasant elderly male with no prior history of skin infections. He noticed some right groin pain and swelling over a week ago. It came to the head and started to drain.He saw Dr. Sherren Mocha  The patient was started on some oral antibiotics. Culture was sent yesterday.  Based on concerns, his primary care physicians and the patient is to see if more aggressive surgical drainage needed to be done.  Patient Active Problem List  Diagnoses  . HYPOTHYROIDISM  . HYPERLIPIDEMIA  . DEMENTIA  . HYPERTENSION  . CONGESTIVE HEART FAILURE  . VIRAL URI  . ALLERGIC RHINITIS  . DERMATITIS, CONTACT, NOS  . Venous insufficiency  . Abscess of abdominal wall, RLQ/groin    Past Medical History  Diagnosis Date  . AI (aortic insufficiency)   . CHF (congestive heart failure)   . Hyperlipidemia   . Thyroid disease   . Back pain     chronic with neck pain  . Fatigue   . Dementia   . S/P TURP     x3  . Allergy   . Cancer     skin    Past Surgical History  Procedure Date  . Cervical disc surgery     x2  . Lumbar disc surgery     x2  . Cataracts     bilateral    History   Social History  . Marital Status: Married    Spouse Name: N/A    Number of Children: N/A  . Years of Education: N/A   Occupational History  . Not on file.   Social History Main Topics  . Smoking status: Never Smoker   . Smokeless tobacco: Not on file  . Alcohol Use: No  . Drug Use: No  . Sexually Active:    Other Topics Concern  . Not on file   Social History Narrative  . No narrative on file    Family History  Problem Relation Age of Onset  . Heart disease Other   . Diabetes Other   .  Hypertension Other   . Stroke Other   . Heart disease Mother   . Heart disease Father     Current outpatient prescriptions:aspirin 81 MG tablet, Take 81 mg by mouth daily.  , Disp: , Rfl: ;  atenolol (TENORMIN) 100 MG tablet, Take 1 tablet (100 mg total) by mouth daily., Disp: 90 tablet, Rfl: 3;  donepezil (ARICEPT) 5 MG tablet, Take 1 tablet (5 mg total) by mouth at bedtime as needed., Disp: 100 tablet, Rfl: 3;  doxazosin (CARDURA) 4 MG tablet, Take 1 tablet (4 mg total) by mouth at bedtime., Disp: 100 tablet, Rfl: 3 doxycycline (VIBRA-TABS) 100 MG tablet, Take 1 tablet (100 mg total) by mouth 2 (two) times daily., Disp: 30 tablet, Rfl: 1;  levETIRAcetam (KEPPRA) 250 MG tablet, daily., Disp: , Rfl: ;  levothyroxine (SYNTHROID, LEVOTHROID) 200 MCG tablet, Take 1 tablet (200 mcg total) by mouth daily., Disp: 100 tablet, Rfl: 3;  levothyroxine (SYNTHROID, LEVOTHROID) 50 MCG tablet, Take 1 tablet (50 mcg total) by mouth daily., Disp: 90 tablet, Rfl: 0 simvastatin (ZOCOR) 40 MG tablet, TAKE  1 TABLET BY MOUTH EVERY NIGHT, Disp: 100 tablet, Rfl: 1;  sulfamethoxazole-trimethoprim (BACTRIM DS,SEPTRA DS) 800-160 MG per tablet, Take 1 tablet by mouth 2 (two) times daily., Disp: 30 tablet, Rfl: 1;  torsemide (DEMADEX) 20 MG tablet, Take 1 tablet (20 mg total) by mouth daily., Disp: 100 tablet, Rfl: 3  Allergies  Allergen Reactions  . Penicillins     REACTION: rash  . Propoxyphene Hcl     REACTION: upset stomach    BP 128/86  Pulse 92  Temp(Src) 97.2 F (36.2 C) (Temporal)  Resp 24  Ht 5\' 2"  (1.575 m)  Wt 189 lb 12.8 oz (86.093 kg)  BMI 34.71 kg/m2     Review of Systems  Constitutional: Negative for fever, chills and diaphoresis.  HENT: Negative for sore throat, trouble swallowing and neck pain.   Eyes: Negative for photophobia and visual disturbance.  Respiratory: Negative for choking and shortness of breath.   Cardiovascular: Negative for chest pain and palpitations.  Gastrointestinal:  Negative for nausea, vomiting, diarrhea, constipation, abdominal distention, anal bleeding and rectal pain.  Genitourinary: Negative for dysuria, urgency, penile swelling, scrotal swelling, difficulty urinating, penile pain and testicular pain.  Musculoskeletal: Negative for myalgias, arthralgias and gait problem.  Skin: Negative for color change and rash.  Neurological: Negative for dizziness, speech difficulty, weakness and numbness.  Hematological: Negative for adenopathy.  Psychiatric/Behavioral: Negative for hallucinations, confusion and agitation.       Objective:   Physical Exam  Constitutional: He is oriented to person, place, and time. He appears well-developed and well-nourished. No distress.  HENT:  Head: Normocephalic.  Mouth/Throat: Oropharynx is clear and moist. No oropharyngeal exudate.  Eyes: Conjunctivae and EOM are normal. Pupils are equal, round, and reactive to light. No scleral icterus.  Neck: Normal range of motion. No tracheal deviation present.  Cardiovascular: Normal rate, normal heart sounds and intact distal pulses.   Pulmonary/Chest: Effort normal. No respiratory distress.  Abdominal: Soft. He exhibits no distension. Hernia confirmed negative in the right inguinal area and confirmed negative in the left inguinal area.         No hernias  Musculoskeletal: Normal range of motion. He exhibits no tenderness.  Neurological: He is alert and oriented to person, place, and time. No cranial nerve deficit. He exhibits normal muscle tone. Coordination normal.  Skin: Skin is warm and dry. No rash noted. He is not diaphoretic.  Psychiatric: He has a normal mood and affect. His behavior is normal.       Assessment:     Right groin cellulitis/abscess    Plan:     The pathophysiology of subcutaneous abscess and differential diagnosis was discussed.  Natural history progression was discussed.  The patient's symptoms are not adequately controlled.  Non-operative  treatment has not healed the abscess.  Therefore, I recommended incision & drainage of the abscess to allow the infection to resolve and heal.  Technique, risks, benefits, alternatives discussed.  The patient expressed understanding & wished to proceed.  I placed a field block with local anaesthetic.  I incised the skin over the abscess to release the infection.  I excised skin at the wound to have an adequate opening for drainage & prevent skin reclosure.  I cleaned out a probable sebaceous cyst sac. I'd bring out some necrotic fat as well.I packed the wound with 4x4 gauze Gauze.    The patient tolerated the procedure.  We will have the patient return to clinic for close follow up to make sure the  infection heals.  Followup on cultures. Preliminary as gram-positive cocci. Doxycycline as appropriate  I discussed the patient's status.  Questions were answered.  The patient & his wife expressed understanding & appreciation.

## 2011-10-08 NOTE — Patient Instructions (Signed)
Dressing Change Dressings are placed over wounds to keep them clean, dry, and protected from further injury. This provides an environment that favors wound healing. Good wound care includes resting and elevating the injured part until the pain and swelling are better. Change your wound dressing as recommended by your caregiver. When removing an old dressing, lift it slowly away from the wound. If the dressing sticks to the wound, dampen it with half-strength peroxide or tap water. Clean the wound gently with a moist cloth, remove any loose material, and pack the wound with 4x4 or 2x2 inch clean gauze as recommended by your caregiver. It is okay for a wound to get wet. Wash it with mildly soapy water in the shower. Watch for signs of infection when changing a dressing. SEEK MEDICAL CARE IF:  You develop increased pain, redness, or swelling.   You have pus-like drainage from the wound.   You develop a fever greater than 100.4 F (38 C).  Document Released: 11/05/2004 Document Revised: 06/10/2011 Document Reviewed: 11/30/2007 [facility] Patient Information 2012 [place].

## 2011-10-10 LAB — WOUND CULTURE

## 2011-10-14 ENCOUNTER — Telehealth (INDEPENDENT_AMBULATORY_CARE_PROVIDER_SITE_OTHER): Payer: Self-pay | Admitting: General Surgery

## 2011-10-14 NOTE — Telephone Encounter (Signed)
Patient's wife called after learning patient's culture was positive for MRSA to make sure he was on the right antibiotics. I looked at wound culture. It does show sensitivity to sulfa. Patient is on sulfa and doxycycline. Stated there is a small area beside wound that looks like the wound that was drained when it first started forming. Since patient on two antibiotics I advised her to call if this area starts developing further. Not red, draining, or hot. No fevers. She will call back with any other problems.

## 2011-10-16 ENCOUNTER — Telehealth: Payer: Self-pay | Admitting: *Deleted

## 2011-10-16 NOTE — Telephone Encounter (Signed)
Pt wife was advised Monday by Dr. Sherren Mocha that pt has MRSA.  Needs to know if pt is contagious and if he should go out and be around other people.  Would appears to be getting better but they have several questions about pt's condition.

## 2011-10-17 ENCOUNTER — Other Ambulatory Visit: Payer: Self-pay | Admitting: Family Medicine

## 2011-10-19 NOTE — Telephone Encounter (Signed)
Spoke with wife and a follow up appointment has been made

## 2011-10-19 NOTE — Telephone Encounter (Signed)
No need to take special precautions.  Chest good hand washing

## 2011-10-22 ENCOUNTER — Ambulatory Visit: Payer: Medicare Other | Admitting: Family Medicine

## 2011-10-26 ENCOUNTER — Encounter (INDEPENDENT_AMBULATORY_CARE_PROVIDER_SITE_OTHER): Payer: Self-pay | Admitting: Surgery

## 2011-10-26 ENCOUNTER — Ambulatory Visit (INDEPENDENT_AMBULATORY_CARE_PROVIDER_SITE_OTHER): Payer: Medicare Other | Admitting: Surgery

## 2011-10-26 VITALS — BP 116/78 | HR 66 | Temp 97.4°F | Resp 16 | Ht 67.0 in | Wt 182.8 lb

## 2011-10-26 DIAGNOSIS — L02211 Cutaneous abscess of abdominal wall: Secondary | ICD-10-CM

## 2011-10-26 DIAGNOSIS — L03319 Cellulitis of trunk, unspecified: Secondary | ICD-10-CM

## 2011-10-26 DIAGNOSIS — IMO0002 Reserved for concepts with insufficient information to code with codable children: Secondary | ICD-10-CM | POA: Diagnosis not present

## 2011-10-26 DIAGNOSIS — M545 Low back pain, unspecified: Secondary | ICD-10-CM | POA: Diagnosis not present

## 2011-10-26 DIAGNOSIS — Z22322 Carrier or suspected carrier of Methicillin resistant Staphylococcus aureus: Secondary | ICD-10-CM | POA: Insufficient documentation

## 2011-10-26 DIAGNOSIS — M47817 Spondylosis without myelopathy or radiculopathy, lumbosacral region: Secondary | ICD-10-CM | POA: Diagnosis not present

## 2011-10-26 DIAGNOSIS — L02219 Cutaneous abscess of trunk, unspecified: Secondary | ICD-10-CM

## 2011-10-26 NOTE — Patient Instructions (Signed)
Community-Associated MRSA CA-MRSA stands for community-associated methicillin-resistant Staphylococcus aureus. MRSA is a type of bacteria that is resistant to some common antibiotics. It can cause infections in the skin and many other places in the body. Staphylococcus aureus, often called "staph," is a bacteria that normally lives on the skin or in the nose. Staph on the surface of the skin or in the nose does not cause problems. However, if the staph enters the body through a cut, wound, or break in the skin, an infection can happen. Up until recently, infections with the MRSA type of staph mainly occurred in hospitals and other healthcare settings. There are now increasing problems with MRSA infections in the community as well. Infections with MRSA may be very serious or even life-threatening. CA-MRSA is becoming more common. It is known to spread in crowded settings, in jails and prisons, and in situations where there is close skin-to-skin contact, such as during sporting events or in locker rooms. MRSA can be spread through shared items, such as children's toys, razors, towels, or sports equipment.  CAUSES All staph, including MRSA, are normally harmless unless they enter the body through a scratch, cut, or wound, such as with surgery. All staph, including MRSA, can be spread from person-to-person by touching contaminated objects or through direct contact.  MRSA now causes illness in people who have not been in hospitals or other healthcare facilities. Cases of MRSA diseases in the community have been associated with:   Recent antibiotic use.   Sharing contaminated towels or clothes.   Having active skin diseases.   Participating in contact sports.   Living in crowded settings.   Intravenous (IV) drug use.   Community-associated MRSA infections are usually skin infections, but may cause other severe illnesses.   Staph bacteria are one of the most common causes of skin infection. However,  they are also a common cause of pneumonia, bone or joint infections, and bloodstream infections.   Many people are "colonized" with MRSA but have no signs of infection. This means that people carry the MRSA germ on their skin or in their nose and may never develop MRSA infection.   TREATMENT  Because many people are colonized with staph, including MRSA, preventing the spread of the bacteria from person-to-person is most important. The best way to prevent the spread of bacteria and other germs is through proper hand washing or by using alcohol-based hand disinfectants. The following are other ways to help prevent MRSA infection within community settings.   Wash your hands frequently with soap and water for at least 15 seconds. Otherwise, use alcohol-based hand disinfectants when soap and water is not available.   Make sure people who live with you wash their hands often, too.   Do not share personal items. For example, avoid sharing razors and other personal hygiene items, towels, clothing, and athletic equipment.   Wash and dry your clothes and bedding at the warmest temperatures recommended on the labels.   Keep wounds covered. Pus from infected sores may contain MRSA and other bacteria. Keep cuts and abrasions clean and covered with germ-free (sterile), dry bandages until they are healed.   If you have a wound that appears infected, ask your caregiver if a culture for MRSA and other bacteria should be done.   If you are breastfeeding, talk to your caregiver about MRSA. You may be asked to temporarily stop breastfeeding.  HOME CARE INSTRUCTIONS   Take your antibiotics as directed. Finish them even if you start to  feel better.   Avoid close contact with those around you as much as possible. Do not use towels, razors, toothbrushes, bedding, or other items that will be used by others.   To fight the infection, follow your caregiver's instructions for wound care. Wash your hands before and  after changing your bandages.   If you have an intravascular device, such as a catheter, make sure you know how to care for it.   Be sure to tell any healthcare providers that you have MRSA so they are aware of your infection.  SEEK IMMEDIATE MEDICAL CARE IF:  The infection appears to be getting worse. Signs include:   Increased warmth, redness, or tenderness around the wound site.   A red line that extends from the infection site.   A dark color in the area around the infection.   Wound drainage that is tan, yellow, or green.   A bad smell coming from the wound.   You feel sick to your stomach (nauseous) and throw up (vomit) or cannot keep medicine down.   You have a fever.   Your baby is older than 3 months with a rectal temperature of 102 F (38.9 C) or higher.   Your baby is 25 months old or younger with a rectal temperature of 100.4 F (38 C) or higher.   You have difficulty breathing.  MAKE SURE YOU:   Understand these instructions.   Will watch your condition.   Will get help right away if you are not doing well or get worse.  Document Released: 01/01/2006 Document Revised: 06/10/2011 Document Reviewed: 01/01/2011 [facility] Patient Information 2012 [place].

## 2011-10-26 NOTE — Progress Notes (Signed)
Subjective:     Patient ID: Wayne [name], male   DOB: 1926/05/03, 78 y.o.   MRN: [id number]  Wound Check    Wayne [name]  1925/11/25 [id number]  Patient Care Team: Joycelyn Man as PCP - General  This patient is a 78 y.o.male who presents today for surgical evaluation at the request of Dr. Sherren Mocha.   Dx: RLQ groin abscess  Proc: I&D 10/08/2011  Patient is a pleasant elderly male with no prior history of skin infections.   I did and I&D of a groin abscess on him last month. He grew out MRSA. He is continued on oral doxycycline and Bactrim. His wife notes he gets nauseated. His appetite is down. She thinks he's lost about 20 pounds. No diarrhea. No skin lesions. She continues to do daily dressing changes.   Patient Active Problem List  Diagnoses  . HYPOTHYROIDISM  . HYPERLIPIDEMIA  . DEMENTIA  . HYPERTENSION  . CONGESTIVE HEART FAILURE  . VIRAL URI  . ALLERGIC RHINITIS  . DERMATITIS, CONTACT, NOS  . Venous insufficiency  . Abscess of abdominal wall, RLQ/groin  . MRSA (methicillin resistant staph aureus) culture positive    Past Medical History  Diagnosis Date  . AI (aortic insufficiency)   . CHF (congestive heart failure)   . Hyperlipidemia   . Thyroid disease   . Back pain     chronic with neck pain  . Fatigue   . Dementia   . S/P TURP     x3  . Allergy   . Cancer     skin  . MRSA (methicillin resistant staph aureus) culture positive     Per patient tested on 10/07/11.    Past Surgical History  Procedure Date  . Cervical disc surgery     x2  . Lumbar disc surgery     x2  . Cataracts     bilateral    History   Social History  . Marital Status: Married    Spouse Name: N/A    Number of Children: N/A  . Years of Education: N/A   Occupational History  . Not on file.   Social History Main Topics  . Smoking status: Never Smoker   . Smokeless tobacco: Never Used  . Alcohol Use: No  . Drug Use: No  . Sexually Active: Not on file   Other  Topics Concern  . Not on file   Social History Narrative  . No narrative on file    Family History  Problem Relation Age of Onset  . Heart disease Other   . Diabetes Other   . Hypertension Other   . Stroke Other   . Heart disease Mother   . Heart disease Father     Current outpatient prescriptions:aspirin 81 MG tablet, Take 81 mg by mouth daily.  , Disp: , Rfl: ;  atenolol (TENORMIN) 100 MG tablet, Take 1 tablet (100 mg total) by mouth daily., Disp: 90 tablet, Rfl: 3;  donepezil (ARICEPT) 5 MG tablet, Take 1 tablet (5 mg total) by mouth at bedtime as needed., Disp: 100 tablet, Rfl: 3;  doxazosin (CARDURA) 4 MG tablet, Take 1 tablet (4 mg total) by mouth at bedtime., Disp: 100 tablet, Rfl: 3 levETIRAcetam (KEPPRA) 250 MG tablet, daily., Disp: , Rfl: ;  levothyroxine (SYNTHROID, LEVOTHROID) 200 MCG tablet, Take 1 tablet (200 mcg total) by mouth daily., Disp: 100 tablet, Rfl: 3;  levothyroxine (SYNTHROID, LEVOTHROID) 50 MCG tablet, TAKE 1 TABLET BY MOUTH EVERY DAY,  Disp: 90 tablet, Rfl: 1;  simvastatin (ZOCOR) 40 MG tablet, TAKE 1 TABLET BY MOUTH EVERY NIGHT, Disp: 100 tablet, Rfl: 1 torsemide (DEMADEX) 20 MG tablet, Take 1 tablet (20 mg total) by mouth daily., Disp: 100 tablet, Rfl: 3  Allergies  Allergen Reactions  . Propoxyphene Hcl     REACTION: upset stomach  . Penicillins Rash    BP 116/78  Pulse 66  Temp(Src) 97.4 F (36.3 C) (Temporal)  Resp 16  Ht 5\' 7"  (1.702 m)  Wt 182 lb 12.8 oz (82.918 kg)  BMI 28.63 kg/m2     Review of Systems  Constitutional: Positive for appetite change, fatigue and unexpected weight change. Negative for fever, chills and diaphoresis.  HENT: Negative for sore throat, trouble swallowing and neck pain.   Eyes: Negative for photophobia and visual disturbance.  Respiratory: Negative for choking and shortness of breath.   Cardiovascular: Negative for chest pain and palpitations.  Gastrointestinal: Positive for nausea. Negative for vomiting,  diarrhea, constipation, abdominal distention, anal bleeding and rectal pain.  Genitourinary: Negative for dysuria, urgency, penile swelling, scrotal swelling, difficulty urinating, penile pain and testicular pain.  Musculoskeletal: Negative for myalgias, arthralgias and gait problem.  Skin: Negative for color change and rash.  Neurological: Negative for dizziness, speech difficulty, weakness and numbness.  Hematological: Negative for adenopathy.  Psychiatric/Behavioral: Negative for hallucinations, confusion and agitation.       Objective:   Physical Exam  Constitutional: He is oriented to person, place, and time. He appears well-developed and well-nourished. No distress.  HENT:  Head: Normocephalic.  Mouth/Throat: Oropharynx is clear and moist. No oropharyngeal exudate.  Eyes: Conjunctivae and EOM are normal. Pupils are equal, round, and reactive to light. No scleral icterus.  Neck: Normal range of motion. No tracheal deviation present.  Cardiovascular: Normal rate, normal heart sounds and intact distal pulses.   Pulmonary/Chest: Effort normal. No respiratory distress.  Abdominal: Soft. He exhibits no distension. Hernia confirmed negative in the right inguinal area and confirmed negative in the left inguinal area.         No hernias  Musculoskeletal: Normal range of motion. He exhibits no tenderness.  Neurological: He is alert and oriented to person, place, and time. No cranial nerve deficit. He exhibits normal muscle tone. Coordination normal.  Skin: Skin is warm and dry. No rash noted. He is not diaphoretic.  Psychiatric: He has a normal mood and affect. His behavior is normal.       Assessment:     Right groin cellulitis/abscess improved s/p I&D    Plan:     Just do K-Y jelly and Band-Aid over it. It should close down.  Return to clinic in 3 weeks. If it fully closes, he canceled appointment.  Stop all antibiotics. No evidence of active infection.  They have already  been obsessive of about washing and keeping the house clean.   I did give him information on MRSA colonization. The expressed understanding & appreciation

## 2011-10-28 ENCOUNTER — Telehealth: Payer: Self-pay | Admitting: *Deleted

## 2011-10-28 NOTE — Telephone Encounter (Signed)
Wife called to let Dr. Sherren Mocha know that Pt had surgery with Dr. Johney Maine, and he was taken off all the antibiotics for the MRSA, and seems to be doing quite well.

## 2011-10-29 NOTE — Telephone Encounter (Signed)
Dr. Sherren Mocha aware.

## 2011-11-16 ENCOUNTER — Encounter (INDEPENDENT_AMBULATORY_CARE_PROVIDER_SITE_OTHER): Payer: Self-pay | Admitting: Surgery

## 2011-11-16 ENCOUNTER — Ambulatory Visit (INDEPENDENT_AMBULATORY_CARE_PROVIDER_SITE_OTHER): Payer: Medicare Other | Admitting: Surgery

## 2011-11-16 VITALS — BP 122/78 | HR 66 | Temp 97.9°F | Resp 18 | Ht 67.0 in | Wt 182.2 lb

## 2011-11-16 DIAGNOSIS — L03319 Cellulitis of trunk, unspecified: Secondary | ICD-10-CM | POA: Diagnosis not present

## 2011-11-16 DIAGNOSIS — Z22322 Carrier or suspected carrier of Methicillin resistant Staphylococcus aureus: Secondary | ICD-10-CM

## 2011-11-16 DIAGNOSIS — B3789 Other sites of candidiasis: Secondary | ICD-10-CM | POA: Diagnosis not present

## 2011-11-16 DIAGNOSIS — L02219 Cutaneous abscess of trunk, unspecified: Secondary | ICD-10-CM | POA: Diagnosis not present

## 2011-11-16 DIAGNOSIS — L02211 Cutaneous abscess of abdominal wall: Secondary | ICD-10-CM

## 2011-11-16 MED ORDER — NYSTATIN 100000 UNIT/GM EX POWD: CUTANEOUS | Status: DC

## 2011-11-16 MED ORDER — FLUCONAZOLE 200 MG PO TABS: 200.0000 mg | ORAL_TABLET | Freq: Every day | ORAL | Status: AC

## 2011-11-16 NOTE — Progress Notes (Signed)
Subjective:     Patient ID: Wayne [name], male   DOB: 04-30-1926, 78 y.o.   MRN: [id number]  HPI  Wayne [name]  02/17/1926 [id number]  Patient Care Team: Joycelyn Man, MD as PCP - General  This patient is a 78 y.o.male who presents today for surgical evaluation.   The patient comes in today with his wife. Air using the K-Y jelly for the right groin wound. The patient notes notes some new areas of what he thinks is a "rash". Gets itching and scratches. Right abdomen and left back. The back areas near the spine when he leans on an occasion gets irritated. No fevers or chills. No drainage. They're worried about recurrent/worsening MRSA.  Patient also has redness and itching and irritation in his scrotum and groin. History of jock itch/fungal infections.  Patient Active Problem List  Diagnoses  . HYPOTHYROIDISM  . HYPERLIPIDEMIA  . DEMENTIA  . HYPERTENSION  . CONGESTIVE HEART FAILURE  . VIRAL URI  . ALLERGIC RHINITIS  . DERMATITIS, CONTACT, NOS  . Venous insufficiency  . Abscess of abdominal wall, RLQ/groin  . MRSA (methicillin resistant staph aureus) culture positive  . Candida rash of groin    Past Medical History  Diagnosis Date  . AI (aortic insufficiency)   . CHF (congestive heart failure)   . Hyperlipidemia   . Thyroid disease   . Back pain     chronic with neck pain  . Fatigue   . Dementia   . S/P TURP     x3  . Allergy   . Cancer     skin  . MRSA (methicillin resistant staph aureus) culture positive     Per patient tested on 10/07/11.  . Abdominal abscess   . Scoliosis   . Candida rash of groin 11/16/2011    Past Surgical History  Procedure Date  . Cervical disc surgery     x2  . Lumbar disc surgery     x2  . Cataracts     bilateral    History   Social History  . Marital Status: Married    Spouse Name: N/A    Number of Children: N/A  . Years of Education: N/A   Occupational History  . Not on file.   Social History Main Topics  .  Smoking status: Never Smoker   . Smokeless tobacco: Never Used  . Alcohol Use: No  . Drug Use: No  . Sexually Active: Not on file   Other Topics Concern  . Not on file   Social History Narrative  . No narrative on file    Family History  Problem Relation Age of Onset  . Heart disease Other   . Diabetes Other   . Hypertension Other   . Stroke Other   . Heart disease Mother   . Heart disease Father     Current outpatient prescriptions:aspirin 81 MG tablet, Take 81 mg by mouth daily.  , Disp: , Rfl: ;  atenolol (TENORMIN) 100 MG tablet, Take 1 tablet (100 mg total) by mouth daily., Disp: 90 tablet, Rfl: 3;  donepezil (ARICEPT) 5 MG tablet, Take 1 tablet (5 mg total) by mouth at bedtime as needed., Disp: 100 tablet, Rfl: 3;  doxazosin (CARDURA) 4 MG tablet, Take 1 tablet (4 mg total) by mouth at bedtime., Disp: 100 tablet, Rfl: 3 fluconazole (DIFLUCAN) 200 MG tablet, Take 1 tablet (200 mg total) by mouth daily., Disp: 3 tablet, Rfl: 2;  levETIRAcetam (KEPPRA) 250 MG  tablet, daily., Disp: , Rfl: ;  levothyroxine (SYNTHROID, LEVOTHROID) 200 MCG tablet, Take 1 tablet (200 mcg total) by mouth daily., Disp: 100 tablet, Rfl: 3;  levothyroxine (SYNTHROID, LEVOTHROID) 50 MCG tablet, TAKE 1 TABLET BY MOUTH EVERY DAY, Disp: 90 tablet, Rfl: 1 nystatin (MYCOSTATIN) powder, Apply to affected area 3 times daily, Disp: 30 g, Rfl: 3;  simvastatin (ZOCOR) 40 MG tablet, TAKE 1 TABLET BY MOUTH EVERY NIGHT, Disp: 100 tablet, Rfl: 1;  torsemide (DEMADEX) 20 MG tablet, Take 1 tablet (20 mg total) by mouth daily., Disp: 100 tablet, Rfl: 3  Allergies  Allergen Reactions  . Propoxyphene Hcl     REACTION: upset stomach  . Penicillins Rash    BP 122/78  Pulse 66  Temp(Src) 97.9 F (36.6 C) (Temporal)  Resp 18  Ht 5\' 7"  (1.702 m)  Wt 182 lb 3.2 oz (82.645 kg)  BMI 28.54 kg/m2     Review of Systems  Constitutional: Negative for fever, chills and diaphoresis.  HENT: Negative for sore throat, trouble  swallowing and neck pain.   Eyes: Negative for photophobia and visual disturbance.  Respiratory: Negative for choking and shortness of breath.   Cardiovascular: Negative for chest pain and palpitations.  Gastrointestinal: Negative for nausea, vomiting, abdominal distention, anal bleeding and rectal pain.  Genitourinary: Negative for dysuria, urgency, difficulty urinating and testicular pain.  Musculoskeletal: Negative for myalgias, arthralgias and gait problem.  Skin: Negative for color change and rash.  Neurological: Negative for dizziness, speech difficulty, weakness and numbness.  Hematological: Negative for adenopathy.  Psychiatric/Behavioral: Negative for hallucinations, confusion and agitation.       Objective:   Physical Exam  Constitutional: He is oriented to person, place, and time. He appears well-developed and well-nourished. No distress.  HENT:  Head: Normocephalic.  Mouth/Throat: Oropharynx is clear and moist. No oropharyngeal exudate.  Eyes: Conjunctivae and EOM are normal. Pupils are equal, round, and reactive to light. No scleral icterus.  Neck: Normal range of motion. No tracheal deviation present.  Cardiovascular: Normal rate, normal heart sounds and intact distal pulses.   Pulmonary/Chest: Effort normal. No respiratory distress.  Abdominal: Soft. He exhibits no distension. There is no tenderness. Hernia confirmed negative in the right inguinal area and confirmed negative in the left inguinal area.       Incisions clean with normal healing ridges.  No hernias  Genitourinary:       Red scrotal > groin rash  Musculoskeletal: Normal range of motion. He exhibits no tenderness.  Neurological: He is alert and oriented to person, place, and time. No cranial nerve deficit. He exhibits normal muscle tone. Coordination normal.  Skin: Skin is warm and dry. Rash noted. He is not diaphoretic. There is erythema.     Psychiatric: He has a normal mood and affect. His behavior is  normal.       Assessment:     R groin wound nearly healed  Scrotal yeast rash  Scratching from itching - no cellulitis    Plan:     Continue wound care in the right lower groin wound.  Almost closed.  Cover up scratched areas with Band-Aids to avoid re\re trauma. Consider anti-itching cream.  If persists, consider workup for itching such as medication or other health issues. No strong evidence of jaundice. Consider trial of Benadryl/Atarax for better control  Anti-MRSA cleaning/skin care. And outs given.  Oral and topical fungal control if worse, consider biopsy.

## 2011-11-16 NOTE — Patient Instructions (Signed)
Candida Infection, Adult A candida infection (also called yeast, fungus and Monilia infection) is an overgrowth of yeast that can occur anywhere on the body. A yeast infection commonly occurs in warm, moist body areas. Usually, the infection remains localized but can spread to become a systemic infection. A yeast infection may be a sign of a more severe disease such as diabetes, leukemia, or AIDS. A yeast infection can occur in both men and women. In women, Candida vaginitis is a vaginal infection. It is one of the most common causes of vaginitis. Men usually do not have symptoms or know they have an infection until other problems develop. Men may find out they have a yeast infection because their sex partner has a yeast infection. Uncircumcised men are more likely to get a yeast infection than circumcised men. This is because the uncircumcised glans is not exposed to air and does not remain as dry as that of a circumcised glans. Older adults may develop yeast infections around dentures. CAUSES  Women  Antibiotics.   Steroid medication taken for a long time.   Being overweight (obese).   Diabetes.   Poor immune condition.   Certain serious medical conditions.   Immune suppressive medications for organ transplant patients.   Chemotherapy.   Pregnancy.   Menstration.   Stress and fatigue.   Intravenous drug use.   Oral contraceptives.   Wearing tight-fitting clothes in the crotch area.   Catching it from a sex partner who has a yeast infection.   Spermicide.   Intravenous, urinary, or other catheters.  Men  Catching it from a sex partner who has a yeast infection.   Having oral or anal sex with a person who has the infection.   Spermicide.   Diabetes.   Antibiotics.   Poor immune system.   Medications that suppress the immune system.   Intravenous drug use.   Intravenous, urinary, or other catheters.  SYMPTOMS  Women  Thick, white vaginal discharge.    Vaginal itching.   Redness and swelling in and around the vagina.   Irritation of the lips of the vagina and perineum.   Blisters on the vaginal lips and perineum.   Painful sexual intercourse.   Low blood sugar (hypoglycemia).   Painful urination.   Bladder infections.   Intestinal problems such as constipation, indigestion, bad breath, bloating, increase in gas, diarrhea, or loose stools.  Men  Men may develop intestinal problems such as constipation, indigestion, bad breath, bloating, increase in gas, diarrhea, or loose stools.   Dry, cracked skin on the penis with itching or discomfort.   Jock itch.   Dry, flaky skin.   Athlete's foot.   Hypoglycemia.  DIAGNOSIS  Women  A history and an exam are performed.   The discharge may be examined under a microscope.   A culture may be taken of the discharge.  Men  A history and an exam are performed.   Any discharge from the penis or areas of cracked skin will be looked at under the microscope and cultured.   Stool samples may be cultured.  TREATMENT  Women  Vaginal antifungal suppositories and creams.   Medicated creams to decrease irritation and itching on the outside of the vagina.   Warm compresses to the perineal area to decrease swelling and discomfort.   Oral antifungal medications.   Medicated vaginal suppositories or cream for repeated or recurrent infections.   Wash and dry the irritation areas before applying the cream.     Eating yogurt with lactobacillus may help with prevention and treatment.   Sometimes painting the vagina with gentian violet solution may help if creams and suppositories do not work.  Men  Antifungal creams and oral antifungal medications.   Sometimes treatment must continue for 30 days after the symptoms go away to prevent recurrence.  HOME CARE INSTRUCTIONS  Women  Use cotton underwear and avoid tight-fitting clothing.   Avoid colored, scented toilet paper and  deodorant tampons or pads.   Do not douche.   Keep your diabetes under control.   Finish all the prescribed medications.   Keep your skin clean and dry.   Consume milk or yogurt with lactobacillus active culture regularly. If you get frequent yeast infections and think that is what the infection is, there are over-the-counter medications that you can get. If the infection does not show healing in 3 days, talk to your caregiver.   Tell your sex partner you have a yeast infection. Your partner may need treatment also, especially if your infection does not clear up or recurs.  Men  Keep your skin clean and dry.   Keep your diabetes under control.   Finish all prescribed medications.   Tell your sex partner that you have a yeast infection so they can be treated if necessary.  SEEK MEDICAL CARE IF:   Your symptoms do not clear up or worsen in one week after treatment.   You have an oral temperature above 102 F (38.9 C).   You have trouble swallowing or eating for a prolonged time.   You develop blisters on and around your vagina.   You develop vaginal bleeding and it is not your menstrual period.   You develop abdominal pain.   You develop intestinal problems as mentioned above.   You get weak or lightheaded.   You have painful or increased urination.   You have pain during sexual intercourse.  MAKE SURE YOU:   Understand these instructions.   Will watch your condition.   Will get help right away if you are not doing well or get worse.  Document Released: 11/05/2004 Document Revised: 06/10/2011 Document Reviewed: 02/17/2010 [facility] Patient Information 2012 [place].  MRSA Overview MRSA stands for methicillin-resistant Staphylococcus aureus. It is a type of bacteria that is resistant to some common antibiotics. It can cause infections in the skin and many other places in the body. Staphylococcus aureus, often called "staph," is a bacteria that normally lives  on the skin or in the nose. Staph on the surface of the skin or in the nose does not cause problems. However, if the staph enters the body through a cut, wound, or break in the skin, an infection can happen. Up until recently, infections with the MRSA type of staph mainly occurred in hospitals and other healthcare settings. There are now increasing problems with MRSA infections in the community as well. Infections with MRSA may be very serious or even life-threatening. Most MRSA infections are acquired in one of two ways:  Healthcare-associated MRSA (HA-MRSA)   This can be acquired by people in any healthcare setting. MRSA can be a big problem for hospitalized people, people in nursing homes, people in rehabilitation facilities, people with weakened immune systems, dialysis patients, and those who have had surgery.   Community-associated MRSA (CA-MRSA)   Community spread of MRSA is becoming more common. It is known to spread in crowded settings, in jails and prisons, and in situations where there is close skin-to-skin contact, such  as during sporting events or in locker rooms. MRSA can be spread through shared items, such as children's toys, razors, towels, or sports equipment.  CAUSES  All staph, including MRSA, are normally harmless unless they enter the body through a scratch, cut, or wound, such as with surgery. All staph, including MRSA, can be spread from person-to-person by touching contaminated objects or through direct contact. SPECIAL GROUPS MRSA can present problems for special groups of people. Some of these groups include:  Breastfeeding women.   The most common problem is MRSA infection of the breast (mastitis). There is evidence that MRSA can be passed to an infant from infected breast milk. Your caregiver may recommend that you stop breastfeeding until the mastitis is under control.   If you are breastfeeding and have a MRSA infection in a place other than the breast, you may  usually continue breastfeeding while under treatment. If taking antibiotics, ask your caregiver if it is safe to continue breastfeeding while taking your prescribed medicines.   Neonates (babies from birth to 32 month old) and infants (babies from 42 month to 76 year old).   There is evidence that MRSA can be passed to a newborn at birth if the mother has MRSA on the skin, in or around the birth canal, or an infection in the uterus, cervix, or vagina. MRSA infection can have the same appearance as a normal newborn or infant rash or several other skin infections. This can make it hard to diagnose MRSA.   Immune compromised people.   If you have an immune system problem, you may have a higher chance of developing a MRSA infection.   People after any type of surgery.   Staph in general, including MRSA, is the most common cause of infections occurring at the site of recent surgery.   People on long-term steroid medicines.   These kinds of medicines can lower your resistance to infection. This can increase your chance of getting MRSA.   People who have had frequent hospitalizations, live in nursing homes or other residential care facilities, have venous or urinary catheters, or have taken multiple courses of antibiotic therapy for any reason.  DIAGNOSIS  Diagnosis of MRSA is done by cultures of fluid samples that may come from:  Swabs taken from cuts or wounds in infected areas.   Nasal swabs.   Saliva or deep cough specimens from the lungs (sputum).   Urine.   Blood.  Many people are "colonized" with MRSA but have no signs of infection. This means that people carry the MRSA germ on their skin or in their nose and may never develop MRSA infection.  TREATMENT  Treatment varies and is based on how serious, how deep, or how extensive the infection is. For example:  Some skin infections, such as a small boil or abscess, may be treated by draining yellowish-white fluid (pus) from the site of  the infection.   Deeper or more widespread soft tissue infections are usually treated with surgery to drain pus and with antibiotic medicine given by vein or by mouth. This may be recommended even if you are pregnant.   Serious infections may require a hospital stay.  If antibiotics are given, they may be needed for several weeks. PREVENTION  Because many people are colonized with staph, including MRSA, preventing the spread of the bacteria from person-to-person is most important. The best way to prevent the spread of bacteria and other germs is through proper hand washing or by using alcohol-based  hand disinfectants. The following are other ways to help prevent MRSA infection within the hospital and community settings.   Healthcare settings:   Strict hand washing or hand disinfection procedures need to be followed before and after touching every patient.   Patients infected with MRSA are placed in isolation to prevent the spread of the bacteria.   Healthcare workers need to wear disposable gowns and gloves when touching or caring for patients infected with MRSA. Visitors may also be asked to wear a gown and gloves.   Hospital surfaces need to be disinfected frequently.   Community settings:   [employer] frequently with soap and water for at least 15 seconds. Otherwise, use alcohol-based hand disinfectants when soap and water is not available.   Make sure people who live with you wash their hands often, too.   Do not share personal items. For example, avoid sharing razors and other personal hygiene items, towels, clothing, and athletic equipment.   Wash and dry your clothes and bedding at the warmest temperatures recommended on the labels.   Keep wounds covered. Pus from infected sores may contain MRSA and other bacteria. Keep cuts and abrasions clean and covered with germ-free (sterile), dry bandages until they are healed.   If you have a wound that appears infected, ask your  caregiver if a culture for MRSA and other bacteria should be done.   If you are breastfeeding, talk to your caregiver about MRSA. You may be asked to temporarily stop breastfeeding.  HOME CARE INSTRUCTIONS   Take your antibiotics as directed. Finish them even if you start to feel better.   Avoid close contact with those around you as much as possible. Do not use towels, razors, toothbrushes, bedding, or other items that will be used by others.   To fight the infection, follow your caregiver's instructions for wound care. Wash your hands before and after changing your bandages.   If you have an intravascular device, such as a catheter, make sure you know how to care for it.   Be sure to tell any healthcare providers that you have MRSA so they are aware of your infection.  SEEK IMMEDIATE MEDICAL CARE IF:   The infection appears to be getting worse. Signs include:   Increased warmth, redness, or tenderness around the wound site.   A red line that extends from the infection site.   A dark color in the area around the infection.   Wound drainage that is tan, yellow, or green.   A bad smell coming from the wound.   You feel sick to your stomach (nauseous) and throw up (vomit) or cannot keep medicine down.   You have a fever.   Your baby is older than 3 months with a rectal temperature of 102 F (38.9 C) or higher.   Your baby is 76 months old or younger with a rectal temperature of 100.4 F (38 C) or higher.   You have difficulty breathing.  MAKE SURE YOU:   Understand these instructions.   Will watch your condition.   Will get help right away if you are not doing well or get worse.  Document Released: 09/28/2005 Document Revised: 06/10/2011 Document Reviewed: 12/31/2010 [facility] Patient Information 2012 [place].

## 2011-12-07 DIAGNOSIS — M545 Low back pain, unspecified: Secondary | ICD-10-CM | POA: Diagnosis not present

## 2011-12-07 DIAGNOSIS — M961 Postlaminectomy syndrome, not elsewhere classified: Secondary | ICD-10-CM | POA: Diagnosis not present

## 2011-12-07 DIAGNOSIS — IMO0002 Reserved for concepts with insufficient information to code with codable children: Secondary | ICD-10-CM | POA: Diagnosis not present

## 2011-12-15 DIAGNOSIS — M5430 Sciatica, unspecified side: Secondary | ICD-10-CM | POA: Diagnosis not present

## 2011-12-15 DIAGNOSIS — M5137 Other intervertebral disc degeneration, lumbosacral region: Secondary | ICD-10-CM | POA: Diagnosis not present

## 2011-12-15 DIAGNOSIS — M999 Biomechanical lesion, unspecified: Secondary | ICD-10-CM | POA: Diagnosis not present

## 2011-12-18 DIAGNOSIS — F4323 Adjustment disorder with mixed anxiety and depressed mood: Secondary | ICD-10-CM | POA: Diagnosis not present

## 2011-12-18 DIAGNOSIS — F4542 Pain disorder with related psychological factors: Secondary | ICD-10-CM | POA: Diagnosis not present

## 2011-12-21 ENCOUNTER — Ambulatory Visit (INDEPENDENT_AMBULATORY_CARE_PROVIDER_SITE_OTHER): Payer: Medicare Other | Admitting: Family Medicine

## 2011-12-21 ENCOUNTER — Encounter: Payer: Self-pay | Admitting: Family Medicine

## 2011-12-21 DIAGNOSIS — G4700 Insomnia, unspecified: Secondary | ICD-10-CM | POA: Diagnosis not present

## 2011-12-21 DIAGNOSIS — I10 Essential (primary) hypertension: Secondary | ICD-10-CM

## 2011-12-21 MED ORDER — LORAZEPAM 0.5 MG PO TABS: 0.5000 mg | ORAL_TABLET | Freq: Two times a day (BID) | ORAL | Status: AC | PRN

## 2011-12-21 NOTE — Progress Notes (Signed)
  Subjective:    Patient ID: Wayne [name], male    DOB: [birth year], 78 y.o.   MRN: [id number]  HPI Wayne [name] is an 78 year old married male nonsmoker who comes in today accompanied by his wife who is his primary medicine geru, who comes in for evaluation of 2 problems  He now has been taking his Demadex 20 mg daily instead of when necessary and his weight is down to 184 however his blood pressures dropped to 110 he is lightheaded when he stands up. We need to continue the diuretic we'll begin to cut back on his beta blocker  He is currently undergoing evaluation by neurosurgery for an implantable spinal device because of chronic back pain  He also has chronic insomnia which is getting worse. He would like something for sleep. He is no longer driving   Review of Systems General and cardiovascular review of systems otherwise negative    Objective:   Physical Exam  Well-developed well-nourished male in no acute distress cardiopulmonary exam negative legs show trace edema      Assessment & Plan:  Hypertension decrease beta blocker to 50 mg daily followup in 4 weeks  Insomnia Ativan 0.5 each bedtime

## 2011-12-21 NOTE — Patient Instructions (Signed)
Decrease the atenolol to 50 mg daily  Ativan 0.5,,,,,,,,,,, 1 tablet at bedtime as needed for sleep  Return in one month for followup  Check your blood pressure daily in the morning at home

## 2011-12-23 DIAGNOSIS — G894 Chronic pain syndrome: Secondary | ICD-10-CM | POA: Diagnosis not present

## 2011-12-23 DIAGNOSIS — IMO0002 Reserved for concepts with insufficient information to code with codable children: Secondary | ICD-10-CM | POA: Diagnosis not present

## 2011-12-23 DIAGNOSIS — M545 Low back pain, unspecified: Secondary | ICD-10-CM | POA: Diagnosis not present

## 2011-12-23 DIAGNOSIS — M961 Postlaminectomy syndrome, not elsewhere classified: Secondary | ICD-10-CM | POA: Diagnosis not present

## 2012-01-18 ENCOUNTER — Ambulatory Visit (INDEPENDENT_AMBULATORY_CARE_PROVIDER_SITE_OTHER): Payer: Medicare Other | Admitting: Family Medicine

## 2012-01-18 ENCOUNTER — Encounter: Payer: Self-pay | Admitting: Family Medicine

## 2012-01-18 DIAGNOSIS — E039 Hypothyroidism, unspecified: Secondary | ICD-10-CM

## 2012-01-18 DIAGNOSIS — I10 Essential (primary) hypertension: Secondary | ICD-10-CM | POA: Diagnosis not present

## 2012-01-18 DIAGNOSIS — J309 Allergic rhinitis, unspecified: Secondary | ICD-10-CM

## 2012-01-18 DIAGNOSIS — I509 Heart failure, unspecified: Secondary | ICD-10-CM

## 2012-01-18 LAB — BASIC METABOLIC PANEL
GFR: 45.7 mL/min — ABNORMAL LOW (ref >60.00)
Glucose, Bld: 102 mg/dL — ABNORMAL HIGH (ref 70–99)
Potassium: 3.9 mEq/L (ref 3.5–5.1)
Sodium: 140 mEq/L (ref 135–145)

## 2012-01-18 NOTE — Patient Instructions (Signed)
Continue your current medications  I will call you your lab work reports  Followup in 3 months sooner if any problems

## 2012-01-18 NOTE — Progress Notes (Signed)
  Subjective:    Patient ID: Wayne [name], male    DOB: 1926/08/11, 78 y.o.   MRN: [id number]  HPI Wayne [name] is an 78 year old married male nonsmoker who comes in today for followup of multiple issues  He has a history of hypertension and congestive heart failure he is currently on 50 mg of Tenormin every morning 20 mg of Demadex every morning and doing much better since he's taken of Demadex daily. His weight is stabilized at 184 pounds he only now has trace edema in the past he would only take as Demadex couple times a week. He's not able to work anymore and he is due to have a spinal cord stimulator put in tomorrow by Dr. Maryjean Ka  He takes Synthroid 250 mcg daily will check TSH level today  He does have allergic rhinitis recommend over-the-counter Claritin  Other meds reviewed and are stable   Review of Systems General and cardiovascular review of systems otherwise negative    Objective:   Physical Exam Well-developed well-nourished male in no acute distress he sits with severe anterior flexion about 45  Lungs are clear to auscultation cardiac exam normal trace edema       Assessment & Plan:  Hypertension at goal continue current therapy  Congestive heart failure stable  Hypothyroidism check labs

## 2012-01-19 DIAGNOSIS — M545 Low back pain, unspecified: Secondary | ICD-10-CM | POA: Diagnosis not present

## 2012-01-19 DIAGNOSIS — IMO0002 Reserved for concepts with insufficient information to code with codable children: Secondary | ICD-10-CM | POA: Diagnosis not present

## 2012-01-19 LAB — TSH: TSH: 0.01 u[IU]/mL — ABNORMAL LOW (ref 0.35–5.50)

## 2012-01-25 ENCOUNTER — Encounter: Payer: Self-pay | Admitting: Internal Medicine

## 2012-01-25 ENCOUNTER — Ambulatory Visit (INDEPENDENT_AMBULATORY_CARE_PROVIDER_SITE_OTHER): Payer: Medicare Other | Admitting: Internal Medicine

## 2012-01-25 ENCOUNTER — Other Ambulatory Visit: Payer: Self-pay | Admitting: Family Medicine

## 2012-01-25 VITALS — BP 100/78 | HR 70 | Temp 98.4°F | Wt 184.0 lb

## 2012-01-25 DIAGNOSIS — I951 Orthostatic hypotension: Secondary | ICD-10-CM | POA: Diagnosis not present

## 2012-01-25 DIAGNOSIS — E039 Hypothyroidism, unspecified: Secondary | ICD-10-CM

## 2012-01-25 DIAGNOSIS — R609 Edema, unspecified: Secondary | ICD-10-CM | POA: Diagnosis not present

## 2012-01-25 DIAGNOSIS — I10 Essential (primary) hypertension: Secondary | ICD-10-CM

## 2012-01-25 DIAGNOSIS — I509 Heart failure, unspecified: Secondary | ICD-10-CM | POA: Diagnosis not present

## 2012-01-25 DIAGNOSIS — N289 Disorder of kidney and ureter, unspecified: Secondary | ICD-10-CM

## 2012-01-25 DIAGNOSIS — M542 Cervicalgia: Secondary | ICD-10-CM | POA: Diagnosis not present

## 2012-01-25 DIAGNOSIS — R0602 Shortness of breath: Secondary | ICD-10-CM

## 2012-01-25 NOTE — Progress Notes (Signed)
Subjective:    Patient ID: Wayne [name], male    DOB: 02-12-1926, 78 y.o.   MRN: [id number]  HPI Patient comes in today for an acute office visit with his wife. He is a patient of Dr. Sherren Mocha who is out of the office for a few weeks. He carries the diagnosis of hypothyroid is on CHF hypertension venous insufficiency and some dementia. He is under care for chronic back pain and back disease he has a history of back surgery. Dr Maryjean Ka has been evaluating to do  Spinal implant . Today when the leads were removed from the spine it was noted that his blood pressure was low sitting and dropped very low standing. It was recommended he see his primary doctor.  He has been on atenolol for a while and apparently in the fall was cut in half from 151 Cardura 4 mg was added at bedtime he complains of some orthostatic dizziness since then but no syncope.  He is chronically short of breath but has no major activity. He does not see a cardiologist.  Has had swelling in feet. In the past that responded to diuretics however the last weeks he has had increasing swelling in his legs. No explanation no anti-inflammatories no extra salt  Recent antibiotic for  Prophylaxis  For wires  : Clindamycin.   It was also noted recently that his TSH was over suppressed and was told to go down from 250 mcg of Synthroid to 50 mg a day.  Review of Systems Negative for fever or syncope vision changes active bleeding. Has been on hypertension medicines for many years. He is on medication for mild dementia   Past history family history social history reviewed in the electronic medical record.  Outpatient Prescriptions Prior to Visit  Medication Sig Dispense Refill  . aspirin 81 MG tablet Take 81 mg by mouth daily.        Marland Kitchen atenolol (TENORMIN) 100 MG tablet 100 mg. Half tab daily      . donepezil (ARICEPT) 5 MG tablet Take 1 tablet (5 mg total) by mouth at bedtime as needed.  100 tablet  3  . levothyroxine (SYNTHROID,  LEVOTHROID) 200 MCG tablet Take 1 tablet (200 mcg total) by mouth daily.  100 tablet  3  . levothyroxine (SYNTHROID, LEVOTHROID) 50 MCG tablet TAKE 1 TABLET BY MOUTH EVERY DAY  90 tablet  1  . LORazepam (ATIVAN) 0.5 MG tablet       . nystatin (MYCOSTATIN) powder Apply to affected area 3 times daily  30 g  3  . simvastatin (ZOCOR) 40 MG tablet TAKE 1 TABLET BY MOUTH EVERY NIGHT  100 tablet  0  . torsemide (DEMADEX) 20 MG tablet Take 1 tablet (20 mg total) by mouth daily.  100 tablet  3  . doxazosin (CARDURA) 4 MG tablet Take 1 tablet (4 mg total) by mouth at bedtime.  100 tablet  3        Objective:   Physical Exam BP 100/78  Pulse 70  Temp(Src) 98.4 F (36.9 C) (Oral)  Wt 184 lb (83.462 kg)  SpO2 97% Well-developed well-nourished in no acute distress alert and attentive. He has normal color  Repeat blood pressure right arm regular cuff 118/70 sitting standing 108/72.  Pulse is anywhere from 60-80 range. There is no regularity Chest clear to auscultation no obvious rales or rhonchi. Cardiac no gallops or murmurs heard cardiac sounds somewhat distant large chest wall left with no thrills. Extremities feet showed +  2 to +3 edema. Some varicose veins no obvious ulcers legs were not examined. Back shows area where her probes were removed today and old surgical scar.     Assessment & Plan:   Orthostatic hypotension minimally symptomatic discovered at specialty office today. Medications are probably aggravated and he is on a number that can cause orthostasis. Recent laboratory studies reviewed. Would recommend stopping the Cardura for now monitoring his blood pressure and perhaps increasing the Tenormin back to 50 mg twice a day.  He has a diagnosis of CHF   With AI I don't see an echocardiogram or other cardiovascular  Testing in the record but it only goes back a couple years.. It is also unclear what other meds he has been on for this. His edema has increased  and of note his creatinine  is 1.6 a week ago.    Hypothyroid recent over replacement change in medication recently uncertain if this could have anything to do with his fluid shifts. Discussed with him and his wife will get a chest x-ray to look at heart size and fluid in the lungs  Stopped the Cardura to minimize orthostatic hypotension but follow his blood pressure up his beta blocker as we discussed consider other options if he has hypertension. Can increase the diuretic if tolerated for the next week in followup in a week to daily weights we'll weight him next week. We may repeat his BMET next week.  LBP under care  Planning implant for pain  Total visit 17mins > 50% spent counseling and coordinating care  record review

## 2012-01-25 NOTE — Patient Instructions (Signed)
I agree that  Doxazosin  Could be contributing  To the drop in blood pressure when you stand. Stop the doxazosin  Check bp readings over the week.  If  Bp is 120 and above then increase the  tenormin  To 50 mg twice.    As long as  Pulse is above 50 .  For one week can increase the diuretic  To twice a day.   Get chest x ray  In the meantime   ROV in about a week  Weight every day   .    Marland Kitchen

## 2012-02-01 ENCOUNTER — Ambulatory Visit: Payer: Medicare Other | Admitting: Internal Medicine

## 2012-02-01 ENCOUNTER — Telehealth: Payer: Self-pay | Admitting: Family Medicine

## 2012-02-01 NOTE — Telephone Encounter (Signed)
confidential Office Message [place] [address], [place] [address] p. [phone] f. [phone] To: [facility] Fax: [phone] From: [facility] Date/ Time: 01/31/2012 5:55 PM Taken By: Marcello Moores, CSR Caller: [place]: not collected Patient: Wayne [name], Wayne [name] DOB: Jul 12, 1926 Phone: [phone] Reason for Call: See info below Regarding Appointment: Yes Appt Date: 02/01/2012 Appt Time: 3:15:00 PM Provider: Burnis Medin Reason: Details: Has been unable to get x-ray yet. Will call to reschedule. Outcome: Cancelled appointment in [facility])

## 2012-02-09 ENCOUNTER — Other Ambulatory Visit: Payer: Self-pay | Admitting: Anesthesiology

## 2012-02-11 ENCOUNTER — Encounter (HOSPITAL_COMMUNITY): Payer: Self-pay | Admitting: Pharmacy Technician

## 2012-02-15 ENCOUNTER — Encounter (HOSPITAL_COMMUNITY)
Admission: RE | Admit: 2012-02-15 | Discharge: 2012-02-15 | Disposition: A | Discharge status: Home / Self Care | Payer: Medicare Other | Source: Ambulatory Visit | Attending: Anesthesiology | Admitting: Anesthesiology

## 2012-02-15 ENCOUNTER — Encounter (HOSPITAL_COMMUNITY): Payer: Self-pay

## 2012-02-15 DIAGNOSIS — E785 Hyperlipidemia, unspecified: Secondary | ICD-10-CM | POA: Diagnosis not present

## 2012-02-15 DIAGNOSIS — Z01818 Encounter for other preprocedural examination: Secondary | ICD-10-CM | POA: Diagnosis not present

## 2012-02-15 DIAGNOSIS — Z01810 Encounter for preprocedural cardiovascular examination: Secondary | ICD-10-CM | POA: Diagnosis not present

## 2012-02-15 DIAGNOSIS — I10 Essential (primary) hypertension: Secondary | ICD-10-CM | POA: Diagnosis not present

## 2012-02-15 DIAGNOSIS — F411 Generalized anxiety disorder: Secondary | ICD-10-CM | POA: Diagnosis not present

## 2012-02-15 DIAGNOSIS — M961 Postlaminectomy syndrome, not elsewhere classified: Secondary | ICD-10-CM | POA: Diagnosis not present

## 2012-02-15 DIAGNOSIS — I509 Heart failure, unspecified: Secondary | ICD-10-CM | POA: Diagnosis not present

## 2012-02-15 DIAGNOSIS — E039 Hypothyroidism, unspecified: Secondary | ICD-10-CM | POA: Diagnosis not present

## 2012-02-15 DIAGNOSIS — Z5309 Procedure and treatment not carried out because of other contraindication: Secondary | ICD-10-CM | POA: Diagnosis not present

## 2012-02-15 DIAGNOSIS — M810 Age-related osteoporosis without current pathological fracture: Secondary | ICD-10-CM | POA: Diagnosis not present

## 2012-02-15 DIAGNOSIS — K219 Gastro-esophageal reflux disease without esophagitis: Secondary | ICD-10-CM | POA: Diagnosis not present

## 2012-02-15 DIAGNOSIS — N289 Disorder of kidney and ureter, unspecified: Secondary | ICD-10-CM | POA: Diagnosis not present

## 2012-02-15 DIAGNOSIS — F0390 Unspecified dementia without behavioral disturbance: Secondary | ICD-10-CM | POA: Diagnosis not present

## 2012-02-15 DIAGNOSIS — R0602 Shortness of breath: Secondary | ICD-10-CM | POA: Diagnosis not present

## 2012-02-15 DIAGNOSIS — Z01812 Encounter for preprocedural laboratory examination: Secondary | ICD-10-CM | POA: Diagnosis not present

## 2012-02-15 HISTORY — DX: Benign prostatic hyperplasia without lower urinary tract symptoms: N40.0

## 2012-02-15 HISTORY — DX: Insomnia, unspecified: G47.00

## 2012-02-15 HISTORY — DX: Edema, unspecified: R60.9

## 2012-02-15 HISTORY — DX: Allergy status to unspecified drugs, medicaments and biological substances: Z88.9

## 2012-02-15 HISTORY — DX: Anxiety disorder, unspecified: F41.9

## 2012-02-15 HISTORY — DX: Gastro-esophageal reflux disease without esophagitis: K21.9

## 2012-02-15 HISTORY — DX: Hypothyroidism, unspecified: E03.9

## 2012-02-15 HISTORY — DX: Essential (primary) hypertension: I10

## 2012-02-15 HISTORY — DX: Localized edema: R60.0

## 2012-02-15 LAB — PROTIME-INR
INR: 1.04 (ref 0.00–1.49)
Prothrombin Time: 13.8 seconds (ref 11.6–15.2)

## 2012-02-15 LAB — DIFFERENTIAL
Basophils Absolute: 0.1 10*3/uL (ref 0.0–0.1)
Basophils Relative: 1 % (ref 0–1)
Eosinophils Absolute: 0.3 10*3/uL (ref 0.0–0.7)
Eosinophils Relative: 3 % (ref 0–5)
Lymphocytes Relative: 32 % (ref 12–46)
Lymphs Abs: 3 10*3/uL (ref 0.7–4.0)
Monocytes Absolute: 0.9 10*3/uL (ref 0.1–1.0)
Monocytes Relative: 9 % (ref 3–12)
Neutro Abs: 5.2 10*3/uL (ref 1.7–7.7)
Neutrophils Relative %: 56 % (ref 43–77)

## 2012-02-15 LAB — URINALYSIS, ROUTINE W REFLEX MICROSCOPIC
Bilirubin Urine: NEGATIVE
Glucose, UA: NEGATIVE mg/dL
Hgb urine dipstick: NEGATIVE
Ketones, ur: NEGATIVE mg/dL
Leukocytes, UA: NEGATIVE
Nitrite: NEGATIVE
Protein, ur: NEGATIVE mg/dL
Specific Gravity, Urine: 1.015 (ref 1.005–1.030)
Urobilinogen, UA: 0.2 mg/dL (ref 0.0–1.0)
pH: 5.5 (ref 5.0–8.0)

## 2012-02-15 LAB — SURGICAL PCR SCREEN
MRSA, PCR: NEGATIVE
Staphylococcus aureus: NEGATIVE

## 2012-02-15 LAB — BASIC METABOLIC PANEL
Calcium: 9.8 mg/dL (ref 8.4–10.5)
GFR calc Af Amer: 61 mL/min — ABNORMAL LOW (ref >90)
GFR calc non Af Amer: 52 mL/min — ABNORMAL LOW (ref >90)
Glucose, Bld: 104 mg/dL — ABNORMAL HIGH (ref 70–99)
Sodium: 145 mEq/L (ref 135–145)

## 2012-02-15 LAB — CBC
HCT: 42.9 % (ref 39.0–52.0)
Hemoglobin: 15.1 g/dL (ref 13.0–17.0)
MCH: 31.3 pg (ref 26.0–34.0)
MCHC: 35.2 g/dL (ref 30.0–36.0)
MCV: 88.8 fL (ref 78.0–100.0)
Platelets: 193 10*3/uL (ref 150–400)
RBC: 4.83 MIL/uL (ref 4.22–5.81)
RDW: 13.5 % (ref 11.5–15.5)
WBC: 9.4 10*3/uL (ref 4.0–10.5)

## 2012-02-15 LAB — APTT: aPTT: 26 seconds (ref 24–37)

## 2012-02-15 NOTE — Pre-Procedure Instructions (Signed)
[place]  02/15/2012   Your procedure is scheduled on:  Tues, May 14 @ 12:20 PM  Report to [place] at 9:00 AM.  Call this number if you have problems the morning of surgery: [phone]   Remember:   Do not eat food:After Midnight.  May have clear liquids: up to 4 Hours before arrival.(until 5:00 am)  Clear liquids include soda, tea, black coffee, apple or grape juice, broth,water  Take these medicines the morning of surgery with A SIP OF WATER: Ativan,Levothyroxine,Cardura,and Atenolol   Do not wear jewelry  Do not wear lotions, powders, or perfumes.   Do not bring valuables to the hospital.  Contacts, dentures or bridgework may not be worn into surgery.  Leave suitcase in the car. After surgery it may be brought to your room.  For patients admitted to the hospital, checkout time is 11:00 AM the day of discharge.   Special Instructions: CHG Shower Use Special Wash: 1/2 bottle night before surgery and 1/2 bottle morning of surgery.   Please read over the following fact sheets that you were given: Pain Booklet, Coughing and Deep Breathing, MRSA Information and Surgical Site Infection Prevention

## 2012-02-15 NOTE — Progress Notes (Signed)
Pt doesn't have a cardiologist  Medical Md is Dr.Jeffrey Todd  Stress test done at least 20+yrs ago  Denies echo/heart cath

## 2012-02-16 NOTE — Consult Note (Addendum)
Anesthesia Chart Review:  Patient is a 78 year old male scheduled for spinal cord stimulator placement on 02/23/12.  History includes  CHF (no record of echo found), hypothyroidism, HLD, back pain, MRSA, chronic SOB, peripheral edema, osteoporosis, dementia, BPH s/p TURP, insomnia, anxiety, scoliosis, HTN, GERD, prior c-spine and lumbar surgeries.  PCP is Dr. Stevie Kern who was aware of planned procedure, but on 02/01/12 when patient was being evaluated by Dr. Maryjean Ka he became hypotensive when leads were removed from the spine and he was urgently seen by Dr. Regis Bill and felt to have orthostatic hypotension.  His Cardura was held.  Her note also mentioned that both his edema and Cr had increased from baseline.  Labs on 02/15/12 now show a Cr down to 1.23.  Glucose 104.  CBC and coags WNL.    CXR from 02/15/12 showed: No acute or active cardiopulmonary pleural abnormalities are  identified. Chronic bony findings are described above.   EKG from 02/15/12 showed SB @ 50, right BBB with anterolateral ST/T wave abnormality (appears more pronounced in V4-6 since 30-Jan-2000 but V2-6 abnormality was not really noted on his EKG from 08/21/08). Q wave in lead III with flat ST/T wave in inferior leads.  Patient reported to his PAT nurse that his last stress test was > 20 years ago.  He did not recall ever having a cath or echo.   Reviewed above with Anesthesiologist Dr. Marcie Bal.  With history of CHF, LE edema, chronic SOB, history of mild RI, and abnormal EKG without recent echo (and possibly no prior echo) or other cardiac diagnotic testing, Anesthesiology would recommend either Cardiac evaluation pre-operatively or pre-operative echo ordered by his PCP with subsequent medical clearance as appropriate.  I notified  Rosa at Dr. Donell Sievert office of these clearance options.  She will review with Dr. Maryjean Ka and update me as available.   Addendum: 02/16/12 1620  Dr. Maryjean Ka reviewed options and recommends Cardiology evaluation  pre-operatively.  Dr. Percival Spanish at [facility] is able to see patient on 02/18/12 at 1000.  I notified Mr. Shertzer and told him to arrive by 0945.  I also provided him with the office phone number and address.  As requested, I asked Rosa at Dr. Maryjean Ka to fax [facility] their most recent records to [phone].    Addendum: 02/18/12 1250  Patient was evaluated by Cardiologist Dr. Percival Spanish earlier today.  No further testing recommended preoperatively.  Plan to proceed.  Myra Gianotti, PA-C

## 2012-02-18 ENCOUNTER — Ambulatory Visit (INDEPENDENT_AMBULATORY_CARE_PROVIDER_SITE_OTHER): Payer: Medicare Other | Admitting: Cardiology

## 2012-02-18 ENCOUNTER — Encounter: Payer: Self-pay | Admitting: Cardiology

## 2012-02-18 VITALS — BP 140/90 | HR 51 | Ht 64.0 in | Wt 180.0 lb

## 2012-02-18 DIAGNOSIS — I10 Essential (primary) hypertension: Secondary | ICD-10-CM

## 2012-02-18 DIAGNOSIS — I872 Venous insufficiency (chronic) (peripheral): Secondary | ICD-10-CM | POA: Diagnosis not present

## 2012-02-18 DIAGNOSIS — I951 Orthostatic hypotension: Secondary | ICD-10-CM | POA: Diagnosis not present

## 2012-02-18 DIAGNOSIS — Z01810 Encounter for preprocedural cardiovascular examination: Secondary | ICD-10-CM

## 2012-02-18 NOTE — Assessment & Plan Note (Signed)
This is felt to be the etiology of his lower extremity edema. No further evaluation is indicated.

## 2012-02-18 NOTE — Patient Instructions (Signed)
The current medical regimen is effective;  continue present plan and medications.  Follow as needed

## 2012-02-18 NOTE — Assessment & Plan Note (Signed)
The blood pressure is at target. No change in medications is indicated. We will continue with therapeutic lifestyle changes (TLC).  

## 2012-02-18 NOTE — Assessment & Plan Note (Signed)
I have extensively reviewed his previous records. At this interview, examination and review I see no high-risk I needs for history that would necessitate further cardiovascular testing preoperatively. According to ACC/AHA guidelines the patient would be at acceptable risk for the planned surgery.

## 2012-02-18 NOTE — Assessment & Plan Note (Signed)
This is improving he's not having any symptoms. No change in therapy is suggested.

## 2012-02-18 NOTE — Progress Notes (Signed)
HPI The patient presents for preoperative evaluation prior to getting a TENS unit placed.  He denies any past cardiac history.  He does have a history of a RBBB.  He has a vague history of AI and HF.  However, he denies this.  He has had no prior history of coronary disease at catheterization. He was unable to do a stress test years ago. I don't see any echocardiograms. He does have some chronic lower extremity swelling. He's also had recent problems with orthostasis probably related to medications. This seems to have improved with reduction of beta blocker and discontinuation of one of his prostate medications. He is very limited by back pain but can get around doing some activities (greater than 5 METS).  With his usual activities he denies any chest pressure or, neck discomfort or arm discomfort. He denies any shortness of breath, PND or orthopnea. He has no palpitations, presyncope or syncope. He does sleep chronically in a chair because of his back pain.   Allergies  Allergen Reactions  . Doxazosin Mesylate     Makes blood pressure  . Propoxyphene Hcl Other (See Comments)    REACTION: upset stomach  . Penicillins Rash    Current Outpatient Prescriptions  Medication Sig Dispense Refill  . aspirin EC 81 MG tablet Take 81 mg by mouth daily.      Marland Kitchen atenolol (TENORMIN) 100 MG tablet Take 50 mg by mouth daily. Half tab daily      . donepezil (ARICEPT) 5 MG tablet Take 5 mg by mouth at bedtime.      Marland Kitchen levothyroxine (SYNTHROID, LEVOTHROID) 200 MCG tablet Take 1 tablet (200 mcg total) by mouth daily.  100 tablet  3  . LORazepam (ATIVAN) 0.5 MG tablet Take 0.5 mg by mouth daily.       . simvastatin (ZOCOR) 40 MG tablet Take 40 mg by mouth every evening.      . torsemide (DEMADEX) 20 MG tablet Take 1 tablet (20 mg total) by mouth daily.  100 tablet  3  . DISCONTD: levothyroxine (SYNTHROID, LEVOTHROID) 50 MCG tablet Take 50 mcg by mouth daily.        Past Medical History  Diagnosis Date  . CHF  (congestive heart failure)   . Hyperlipidemia     takes Simvastatin daily  . Back pain     chronic with neck pain  . Cancer     skin  . MRSA (methicillin resistant staph aureus) culture positive     Per patient tested on 10/07/11.  . Scoliosis   . Hypertension     takes atenolol daily  . Hx of seasonal allergies   . Peripheral edema     takes Torsemide daily  . Osteoporosis   . GERD (gastroesophageal reflux disease)     doesn't require medication  . Constipation     related to medication  . Enlarged prostate     self caths once every 1-2wks  . Hypothyroidism     takes Synthroid daily  . Insomnia     related to pain  . Dementia     takes Aricept nightly  . Anxiety     takes Atrivan daily    Past Surgical History  Procedure Date  . Cervical disc surgery     x1  . Lumbar disc surgery     x2  . Cataracts     bilateral  . Hernia repair     Right inguinal  . Toe surgery  right great toe  . Colonoscopy   . Esophagogastroduodenoscopy   . Transurethral resection of prostate     15+yrs ago  . Abdominal cyst     removed-2012;MRSA done by Dr.Gross  . Cataract extraction     Family History  Problem Relation Age of Onset  . Hypertension Other     Multiple family members both sides  . Diabetes Other   . Heart disease Mother 67    Vague history  . Stroke Father   . Anesthesia problems Neg Hx   . Hypotension Neg Hx   . Malignant hyperthermia Neg Hx   . Pseudochol deficiency Neg Hx     History   Social History  . Marital Status: Married    Spouse Name: N/A    Number of Children: 4  . Years of Education: N/A   Occupational History  . Not on file.   Social History Main Topics  . Smoking status: Never Smoker   . Smokeless tobacco: Never Used  . Alcohol Use: No  . Drug Use: No  . Sexually Active: Yes   Other Topics Concern  . Not on file   Social History Narrative   Lives at home with wife.    ROS:  Positive for reflux, constipation,  self-catheterization secondary to prostate problems, swelling in his feet and ankles, seasonal allergies.  Otherwise as stated in the HPI and negative for all other systems.  PHYSICAL EXAM BP 140/90  Pulse 51  Ht 5\' 4"  (1.626 m)  Wt 180 lb (81.647 kg)  BMI 30.90 kg/m2 GENERAL:  Well appearing HEENT:  Pupils equal round and reactive, fundi not visualized, oral mucosa unremarkable NECK:  No jugular venous distention, waveform within normal limits, carotid upstroke brisk and symmetric, no bruits, no thyromegaly LYMPHATICS:  No cervical, inguinal adenopathy LUNGS:  Clear to auscultation bilaterally BACK:  No CVA tenderness CHEST:  Severe lordosis HEART:  PMI not displaced or sustained,S1 and S2 within normal limits, no S3, no S4, no clicks, no rubs, no murmurs ABD:  Flat, positive bowel sounds normal in frequency in pitch, no bruits, no rebound, no guarding, no midline pulsatile mass, no hepatomegaly, no splenomegaly EXT:  2 plus pulses throughout, right greater than left lower extremity edema, no cyanosis no clubbing SKIN:  No rashes no nodules NEURO:  Cranial nerves II through XII grossly intact, motor grossly intact throughout [facility]:  Cognitively intact, oriented to person place and time  EKG:  4/30   sinus rhythm, right bundle branch block, rate 50, no acute ST-T wave changes. No change from EKG in 2009. 02/18/2012    ASSESSMENT AND PLAN

## 2012-02-23 ENCOUNTER — Inpatient Hospital Stay (HOSPITAL_COMMUNITY): Payer: Medicare Other | Admitting: Vascular Surgery

## 2012-02-23 ENCOUNTER — Encounter (HOSPITAL_COMMUNITY): Payer: Self-pay | Admitting: Vascular Surgery

## 2012-02-23 ENCOUNTER — Encounter (HOSPITAL_COMMUNITY)
Admission: RE | Disposition: A | Discharge status: Home / Self Care | Payer: Self-pay | Source: Ambulatory Visit | Attending: Anesthesiology

## 2012-02-23 ENCOUNTER — Ambulatory Visit (HOSPITAL_COMMUNITY)
Admission: RE | Admit: 2012-02-23 | Discharge: 2012-02-23 | Disposition: A | Discharge status: Home / Self Care | Payer: Medicare Other | Source: Ambulatory Visit | Attending: Anesthesiology | Admitting: Anesthesiology

## 2012-02-23 ENCOUNTER — Encounter (HOSPITAL_COMMUNITY): Payer: Self-pay | Admitting: Surgery

## 2012-02-23 DIAGNOSIS — Z01818 Encounter for other preprocedural examination: Secondary | ICD-10-CM | POA: Insufficient documentation

## 2012-02-23 DIAGNOSIS — I10 Essential (primary) hypertension: Secondary | ICD-10-CM | POA: Insufficient documentation

## 2012-02-23 DIAGNOSIS — Z01810 Encounter for preprocedural cardiovascular examination: Secondary | ICD-10-CM | POA: Insufficient documentation

## 2012-02-23 DIAGNOSIS — M810 Age-related osteoporosis without current pathological fracture: Secondary | ICD-10-CM | POA: Insufficient documentation

## 2012-02-23 DIAGNOSIS — N289 Disorder of kidney and ureter, unspecified: Secondary | ICD-10-CM | POA: Insufficient documentation

## 2012-02-23 DIAGNOSIS — F411 Generalized anxiety disorder: Secondary | ICD-10-CM | POA: Insufficient documentation

## 2012-02-23 DIAGNOSIS — K219 Gastro-esophageal reflux disease without esophagitis: Secondary | ICD-10-CM | POA: Insufficient documentation

## 2012-02-23 DIAGNOSIS — M961 Postlaminectomy syndrome, not elsewhere classified: Secondary | ICD-10-CM | POA: Insufficient documentation

## 2012-02-23 DIAGNOSIS — F0390 Unspecified dementia without behavioral disturbance: Secondary | ICD-10-CM | POA: Insufficient documentation

## 2012-02-23 DIAGNOSIS — I509 Heart failure, unspecified: Secondary | ICD-10-CM | POA: Insufficient documentation

## 2012-02-23 DIAGNOSIS — Z01812 Encounter for preprocedural laboratory examination: Secondary | ICD-10-CM | POA: Insufficient documentation

## 2012-02-23 DIAGNOSIS — E785 Hyperlipidemia, unspecified: Secondary | ICD-10-CM | POA: Insufficient documentation

## 2012-02-23 DIAGNOSIS — Z5309 Procedure and treatment not carried out because of other contraindication: Secondary | ICD-10-CM | POA: Insufficient documentation

## 2012-02-23 DIAGNOSIS — E039 Hypothyroidism, unspecified: Secondary | ICD-10-CM | POA: Insufficient documentation

## 2012-02-23 SURGERY — INSERTION, SPINAL CORD STIMULATOR, LUMBAR
Anesthesia: Monitor Anesthesia Care

## 2012-02-23 MED ORDER — SODIUM CHLORIDE 0.9 % IV SOLN
INTRAVENOUS | Status: AC
  Filled 2012-02-23: qty 500

## 2012-02-23 MED ORDER — LACTATED RINGERS IV SOLN
INTRAVENOUS | Status: DC | PRN
  Administered 2012-02-23: 12:00:00 via INTRAVENOUS

## 2012-02-23 MED ORDER — VANCOMYCIN HCL IN DEXTROSE 1-5 GM/200ML-% IV SOLN: 1000.0000 mg | INTRAVENOUS | Status: DC

## 2012-02-23 MED ORDER — VANCOMYCIN HCL IN DEXTROSE 1-5 GM/200ML-% IV SOLN
INTRAVENOUS | Status: AC
  Filled 2012-02-23: qty 200

## 2012-02-23 MED ORDER — BACITRACIN 50000 UNITS IM SOLR
INTRAMUSCULAR | Status: AC
  Filled 2012-02-23: qty 1

## 2012-02-23 SURGICAL SUPPLY — 55 items
ADH SKN CLS APL DERMABOND .7 (GAUZE/BANDAGES/DRESSINGS)
APL SKNCLS STERI-STRIP NONHPOA (GAUZE/BANDAGES/DRESSINGS)
BAG DECANTER FOR FLEXI CONT (MISCELLANEOUS) ×1 IMPLANT
BENZOIN TINCTURE PRP APPL 2/3 (GAUZE/BANDAGES/DRESSINGS) IMPLANT
BLADE SURG ROTATE 9660 (MISCELLANEOUS) IMPLANT
BUR MATCHSTICK NEURO 3.0 LAGG (BURR) IMPLANT
BUR PRECISION FLUTE 5.0 (BURR) IMPLANT
CHLORAPREP W/TINT 26ML (MISCELLANEOUS) ×1 IMPLANT
CLOTH BEACON ORANGE TIMEOUT ST (SAFETY) ×3 IMPLANT
CONT SPEC 4OZ CLIKSEAL STRL BL (MISCELLANEOUS) ×3 IMPLANT
DERMABOND ADVANCED (GAUZE/BANDAGES/DRESSINGS)
DERMABOND ADVANCED .7 DNX12 (GAUZE/BANDAGES/DRESSINGS) IMPLANT
DRAPE C-ARM 42X72 X-RAY (DRAPES) ×3 IMPLANT
DRAPE LAPAROTOMY 100X72X124 (DRAPES) ×3 IMPLANT
DRAPE POUCH INSTRU U-SHP 10X18 (DRAPES) ×3 IMPLANT
DRAPE SURG 17X23 STRL (DRAPES) ×1 IMPLANT
DRESSING TELFA 8X3 (GAUZE/BANDAGES/DRESSINGS) IMPLANT
DRSG COVADERM 4X8 (GAUZE/BANDAGES/DRESSINGS) ×6 IMPLANT
ELECT REM PT RETURN 9FT ADLT (ELECTROSURGICAL)
ELECTRODE REM PT RTRN 9FT ADLT (ELECTROSURGICAL) ×1 IMPLANT
GAUZE SPONGE 4X4 16PLY XRAY LF (GAUZE/BANDAGES/DRESSINGS) IMPLANT
GLOVE BIOGEL PI IND STRL 8 (GLOVE) ×1 IMPLANT
GLOVE BIOGEL PI INDICATOR 8 (GLOVE) ×1
GLOVE ECLIPSE 7.5 STRL STRAW (GLOVE) ×5 IMPLANT
GLOVE EXAM NITRILE LRG STRL (GLOVE) IMPLANT
GLOVE EXAM NITRILE MD LF STRL (GLOVE) IMPLANT
GLOVE EXAM NITRILE XL STR (GLOVE) IMPLANT
GLOVE EXAM NITRILE XS STR PU (GLOVE) IMPLANT
GOWN BRE IMP SLV AUR LG STRL (GOWN DISPOSABLE) ×2 IMPLANT
GOWN BRE IMP SLV AUR XL STRL (GOWN DISPOSABLE) ×2 IMPLANT
GOWN STRL REIN 2XL LVL4 (GOWN DISPOSABLE) ×2 IMPLANT
KIT BASIN OR (CUSTOM PROCEDURE TRAY) ×3 IMPLANT
KIT ROOM TURNOVER OR (KITS) ×3 IMPLANT
NDL 18GX1X1/2 (RX/OR ONLY) (NEEDLE) IMPLANT
NDL HYPO 25X1 1.5 SAFETY (NEEDLE) ×1 IMPLANT
NEEDLE 18GX1X1/2 (RX/OR ONLY) (NEEDLE) IMPLANT
NEEDLE HYPO 25X1 1.5 SAFETY (NEEDLE) ×2 IMPLANT
NS IRRIG 1000ML POUR BTL (IV SOLUTION) ×1 IMPLANT
PACK LAMINECTOMY NEURO (CUSTOM PROCEDURE TRAY) ×3 IMPLANT
PAD ARMBOARD 7.5X6 YLW CONV (MISCELLANEOUS) ×3 IMPLANT
SPONGE LAP 4X18 X RAY DECT (DISPOSABLE) IMPLANT
SPONGE SURGIFOAM ABS GEL SZ50 (HEMOSTASIS) ×1 IMPLANT
STAPLER SKIN PROX WIDE 3.9 (STAPLE) ×3 IMPLANT
STRIP CLOSURE SKIN 1/2X4 (GAUZE/BANDAGES/DRESSINGS) IMPLANT
SUT MNCRL AB 3-0 PS2 18 (SUTURE) ×4 IMPLANT
SUT MNCRL AB 4-0 PS2 18 (SUTURE) ×6 IMPLANT
SUT SILK 0 TIES 10X30 (SUTURE) IMPLANT
SUT SILK 2 0 FS (SUTURE) ×18 IMPLANT
SUT SILK 2 0 TIES 10X30 (SUTURE) IMPLANT
SUT VIC AB 2-0 CP2 18 (SUTURE) ×6 IMPLANT
SYRINGE 10CC LL (SYRINGE) IMPLANT
TOWEL OR 17X24 6PK STRL BLUE (TOWEL DISPOSABLE) ×3 IMPLANT
TOWEL OR 17X26 10 PK STRL BLUE (TOWEL DISPOSABLE) ×3 IMPLANT
WATER STERILE IRR 1000ML POUR (IV SOLUTION) ×1 IMPLANT
YANKAUER SUCT BULB TIP NO VENT (SUCTIONS) ×3 IMPLANT

## 2012-02-23 NOTE — Preoperative (Signed)
Beta Blockers   Reason not to administer Beta Blockers:Not Applicable 

## 2012-02-23 NOTE — Progress Notes (Signed)
Addended by: Bonna Gains on: 02/23/2012 09:11 PM   Modules accepted: Orders

## 2012-02-23 NOTE — Anesthesia Postprocedure Evaluation (Signed)
  Anesthesia Post-op Note  Patient: Wayne [name]  Procedure(s) Performed: * No procedures listed *  Patient Location: PACU  Anesthesia Type: MAC  Level of Consciousness: awake, alert  and oriented  Airway and Oxygen Therapy: Patient Spontanous Breathing  Post-op Pain: none  Post-op Assessment: Post-op Vital signs reviewed, Patient's Cardiovascular Status Stable, Respiratory Function Stable, Patent Airway, No signs of Nausea or vomiting and Pain level controlled  Post-op Vital Signs: Reviewed and stable  Complications: No apparent anesthesia complications

## 2012-02-23 NOTE — Anesthesia Preprocedure Evaluation (Addendum)
Anesthesia Evaluation  Patient identified by MRN, date of birth, ID band Patient awake    Reviewed: Allergy & Precautions, H&P , NPO status , Patient's Chart, lab work & pertinent test results, reviewed documented beta blocker date and time   History of Anesthesia Complications Negative for: history of anesthetic complications  Airway Mallampati: II TM Distance: >3 FB Neck ROM: Full    Dental  (+) Caps, Chipped, Partial Lower and Dental Advisory Given   Pulmonary neg pulmonary ROS,  breath sounds clear to auscultation  Pulmonary exam normal       Cardiovascular hypertension, Pt. on medications and Pt. on home beta blockers - CHF Rhythm:Regular Rate:Normal  Cardiology visit this week: Hochrein clears without further cardiac work up   Neuro/Psych PSYCHIATRIC DISORDERS Anxiety Dementia Scoliosis  Cervical fusion Chronic back and leg pain    GI/Hepatic GERD-  Controlled,  Endo/Other  Hypothyroidism (on replacement)   Renal/GU Renal InsufficiencyRenal disease (creat 1.23)     Musculoskeletal   Abdominal (+) + obese,   Peds  Hematology   Anesthesia Other Findings   Reproductive/Obstetrics                       Anesthesia Physical Anesthesia Plan  ASA: III  Anesthesia Plan: MAC   Post-op Pain Management:    Induction: Intravenous  Airway Management Planned: Nasal Cannula  Additional Equipment:   Intra-op Plan:   Post-operative Plan:   Informed Consent: I have reviewed the patients History and Physical, chart, labs and discussed the procedure including the risks, benefits and alternatives for the proposed anesthesia with the patient or authorized representative who has indicated his/her understanding and acceptance.   Dental advisory given  Plan Discussed with: Surgeon and CRNA  Anesthesia Plan Comments: (Plan routine monitors, MAC)        Anesthesia Quick Evaluation

## 2012-02-23 NOTE — H&P (Addendum)
Wayne [name] is an 78 y.o. male.   Chief Complaint: back and leg pain, post-laminectomy syndrome HPI: 78 year old man with ongoing back and leg pain secondary to lumbar postlaminectomy syndrome. Essentially failed interventional and medical management. Had good trial of SCS, now coming to the OR for permanent implantation.  Past Medical History  Diagnosis Date  . CHF (congestive heart failure)   . Hyperlipidemia     takes Simvastatin daily  . Back pain     chronic with neck pain  . Cancer     skin  . MRSA (methicillin resistant staph aureus) culture positive     Per patient tested on 10/07/11.  . Scoliosis   . Hypertension     takes atenolol daily  . Hx of seasonal allergies   . Peripheral edema     takes Torsemide daily  . Osteoporosis   . GERD (gastroesophageal reflux disease)     doesn't require medication  . Constipation     related to medication  . Enlarged prostate     self caths once every 1-2wks  . Hypothyroidism     takes Synthroid daily  . Insomnia     related to pain  . Dementia     takes Aricept nightly  . Anxiety     takes Atrivan daily    Past Surgical History  Procedure Date  . Cervical disc surgery     x1  . Lumbar disc surgery     x2  . Cataracts     bilateral  . Hernia repair     Right inguinal  . Toe surgery     right great toe  . Colonoscopy   . Esophagogastroduodenoscopy   . Transurethral resection of prostate     15+yrs ago  . Abdominal cyst     removed-2012;MRSA done by Dr.Gross  . Cataract extraction     Family History  Problem Relation Age of Onset  . Hypertension Other     Multiple family members both sides  . Diabetes Other   . Heart disease Mother 66    Vague history  . Stroke Father   . Anesthesia problems Neg Hx   . Hypotension Neg Hx   . Malignant hyperthermia Neg Hx   . Pseudochol deficiency Neg Hx    Social History:  reports that he has never smoked. He has never used smokeless tobacco. He reports that he does  not drink alcohol or use illicit drugs.  Allergies:  Allergies  Allergen Reactions  . Doxazosin Mesylate     Makes blood pressure  . Propoxyphene Hcl Other (See Comments)    REACTION: upset stomach  . Penicillins Rash    Medications Prior to Admission  Medication Sig Dispense Refill  . aspirin EC 81 MG tablet Take 81 mg by mouth daily.      Marland Kitchen atenolol (TENORMIN) 100 MG tablet Take 50 mg by mouth daily. Half tab daily      . donepezil (ARICEPT) 5 MG tablet Take 5 mg by mouth at bedtime.      Marland Kitchen levothyroxine (SYNTHROID, LEVOTHROID) 200 MCG tablet Take 1 tablet (200 mcg total) by mouth daily.  100 tablet  3  . LORazepam (ATIVAN) 0.5 MG tablet Take 0.5 mg by mouth daily.       Marland Kitchen torsemide (DEMADEX) 20 MG tablet Take 1 tablet (20 mg total) by mouth daily.  100 tablet  3  . simvastatin (ZOCOR) 40 MG tablet Take 40 mg by mouth every  evening.        No results found for this or any previous visit (from the past 48 hour(s)). No results found.  Review of Systems  Constitutional: Negative.   HENT: Positive for hearing loss and neck pain.   Eyes: Negative for photophobia, pain, discharge and redness.  Respiratory: Negative.   Cardiovascular: Negative.   Gastrointestinal: Negative.   Genitourinary: Negative.   Skin: Negative.   Psychiatric/Behavioral: Negative.     Blood pressure 181/76, pulse 79, temperature 97.8 F (36.6 C), temperature source Oral, resp. rate 16, SpO2 96.00%. Physical Exam  Constitutional: He is oriented to person, place, and time. He appears well-developed and well-nourished.  HENT:  Head: Normocephalic and atraumatic.  Eyes: EOM are normal. Pupils are equal, round, and reactive to light.  Cardiovascular: Normal rate.   Respiratory: Effort normal.  GI: Soft.  Neurological: He is alert and oriented to person, place, and time.     Assessment/Plan Permanent SCS today for PLS.  ADDENDUM: Pt noted to have scabbed area at midportion of lumbar spine which would  have likely been included in the surgical site. Case cancelled as it is elective, and we will minimize the risk of infection. Will reschedule  Bonna Gains 02/23/2012, 9:28 AM

## 2012-02-23 NOTE — Transfer of Care (Signed)
Immediate Anesthesia Transfer of Care Note  Case cancelled by Dr Maryjean Ka

## 2012-03-09 ENCOUNTER — Telehealth: Payer: Self-pay | Admitting: Family Medicine

## 2012-03-09 NOTE — Telephone Encounter (Signed)
We received notification that this patient did not get the stat chest xray that was ordered by Panosh on 01/25/12. Thank you.

## 2012-04-08 DIAGNOSIS — M961 Postlaminectomy syndrome, not elsewhere classified: Secondary | ICD-10-CM | POA: Diagnosis not present

## 2012-04-08 DIAGNOSIS — M542 Cervicalgia: Secondary | ICD-10-CM | POA: Diagnosis not present

## 2012-04-08 DIAGNOSIS — G894 Chronic pain syndrome: Secondary | ICD-10-CM | POA: Diagnosis not present

## 2012-05-08 ENCOUNTER — Other Ambulatory Visit: Payer: Self-pay | Admitting: Family Medicine

## 2012-05-10 DIAGNOSIS — IMO0002 Reserved for concepts with insufficient information to code with codable children: Secondary | ICD-10-CM | POA: Diagnosis not present

## 2012-05-10 DIAGNOSIS — M545 Low back pain, unspecified: Secondary | ICD-10-CM | POA: Diagnosis not present

## 2012-05-11 ENCOUNTER — Other Ambulatory Visit: Payer: Self-pay | Admitting: Family Medicine

## 2012-06-15 ENCOUNTER — Other Ambulatory Visit: Payer: Self-pay | Admitting: Family Medicine

## 2012-06-16 ENCOUNTER — Other Ambulatory Visit: Payer: Self-pay | Admitting: *Deleted

## 2012-06-16 MED ORDER — LORAZEPAM 0.5 MG PO TABS: 0.5000 mg | ORAL_TABLET | Freq: Two times a day (BID) | ORAL | Status: DC

## 2012-07-25 DIAGNOSIS — M545 Low back pain, unspecified: Secondary | ICD-10-CM | POA: Diagnosis not present

## 2012-08-03 ENCOUNTER — Other Ambulatory Visit: Payer: Self-pay | Admitting: Family Medicine

## 2012-08-15 ENCOUNTER — Other Ambulatory Visit: Payer: Self-pay | Admitting: Family Medicine

## 2012-09-26 DIAGNOSIS — M545 Low back pain, unspecified: Secondary | ICD-10-CM | POA: Diagnosis not present

## 2012-09-28 DIAGNOSIS — M545 Low back pain, unspecified: Secondary | ICD-10-CM | POA: Diagnosis not present

## 2012-10-17 DIAGNOSIS — Z23 Encounter for immunization: Secondary | ICD-10-CM | POA: Diagnosis not present

## 2012-10-26 DIAGNOSIS — M545 Low back pain, unspecified: Secondary | ICD-10-CM | POA: Diagnosis not present

## 2012-10-30 IMAGING — CR DG CHEST 2V
2 series · 2 of 2 positions shown · non-contrast
Comparison: 12/17/2005 study. CT 07/20/2011.

CLINICAL DATA: History of hypertension and congestive heart
failure.  Shortness of breath.

CHEST - 2 VIEW

[view not recorded (1 of 2)]
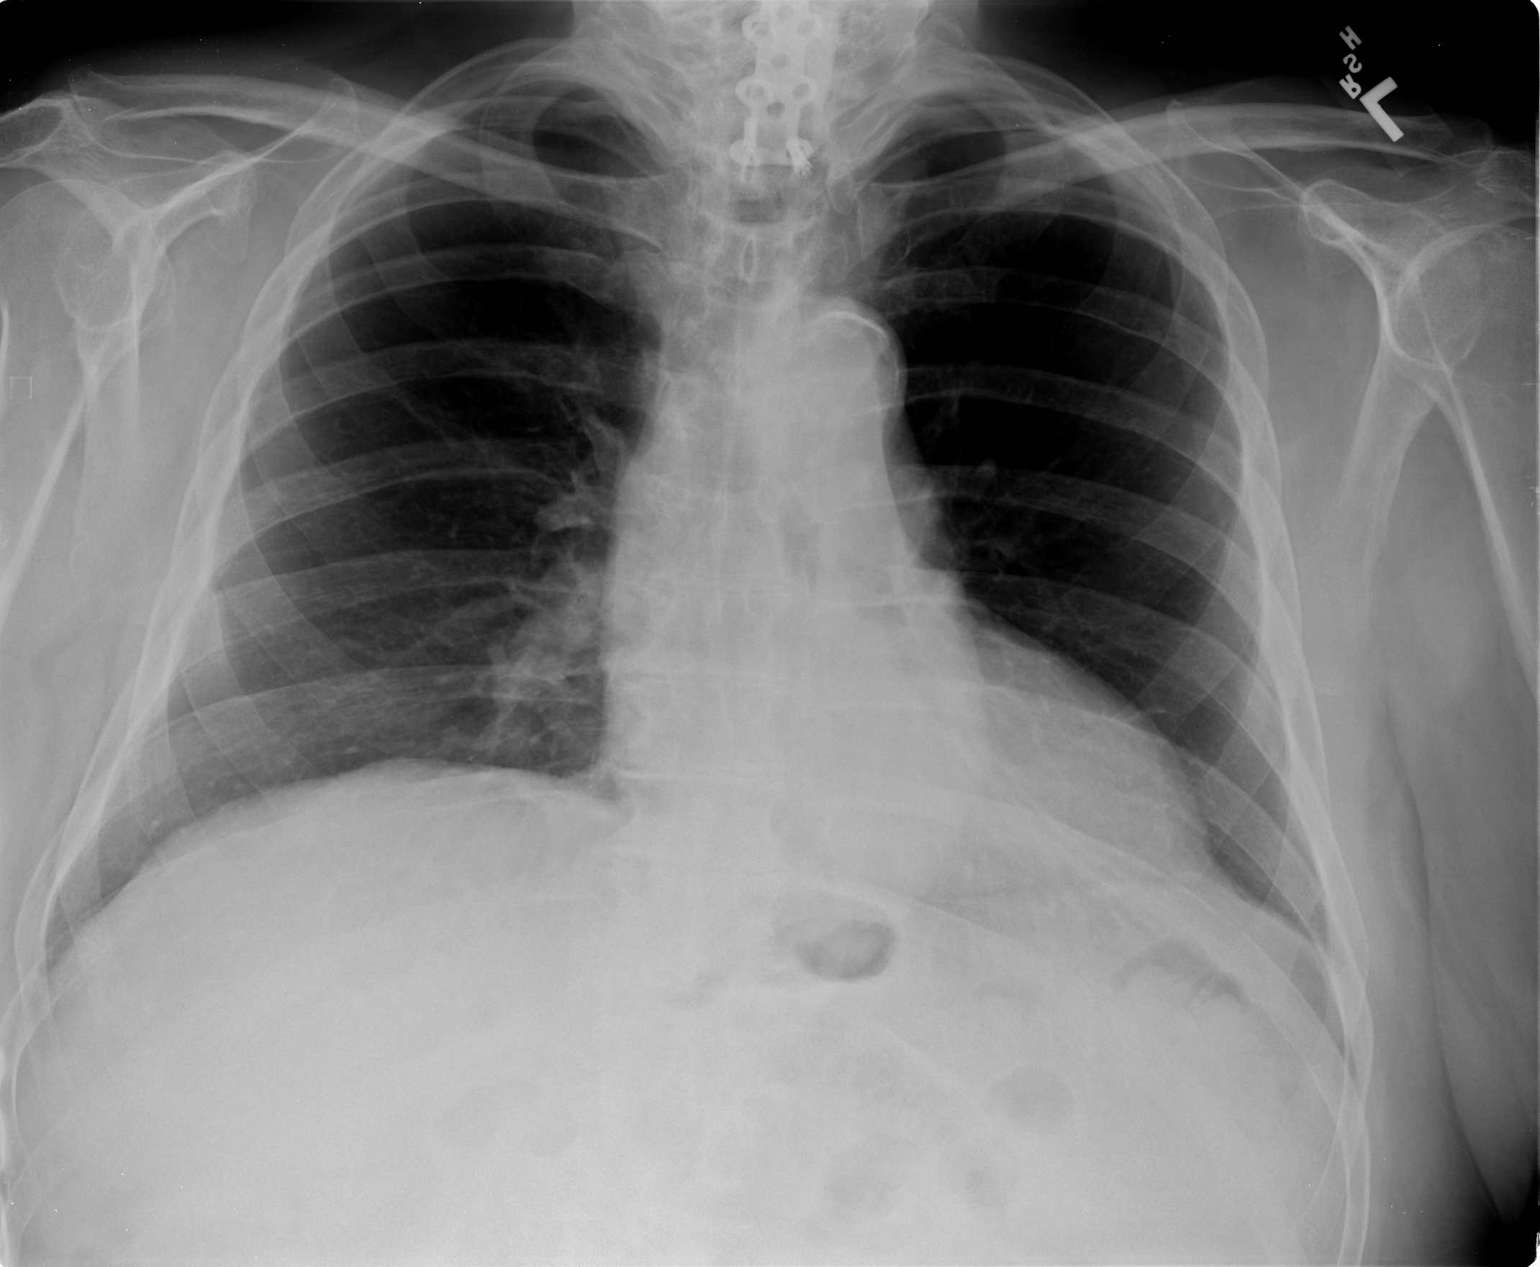

[view not recorded (2 of 2)]
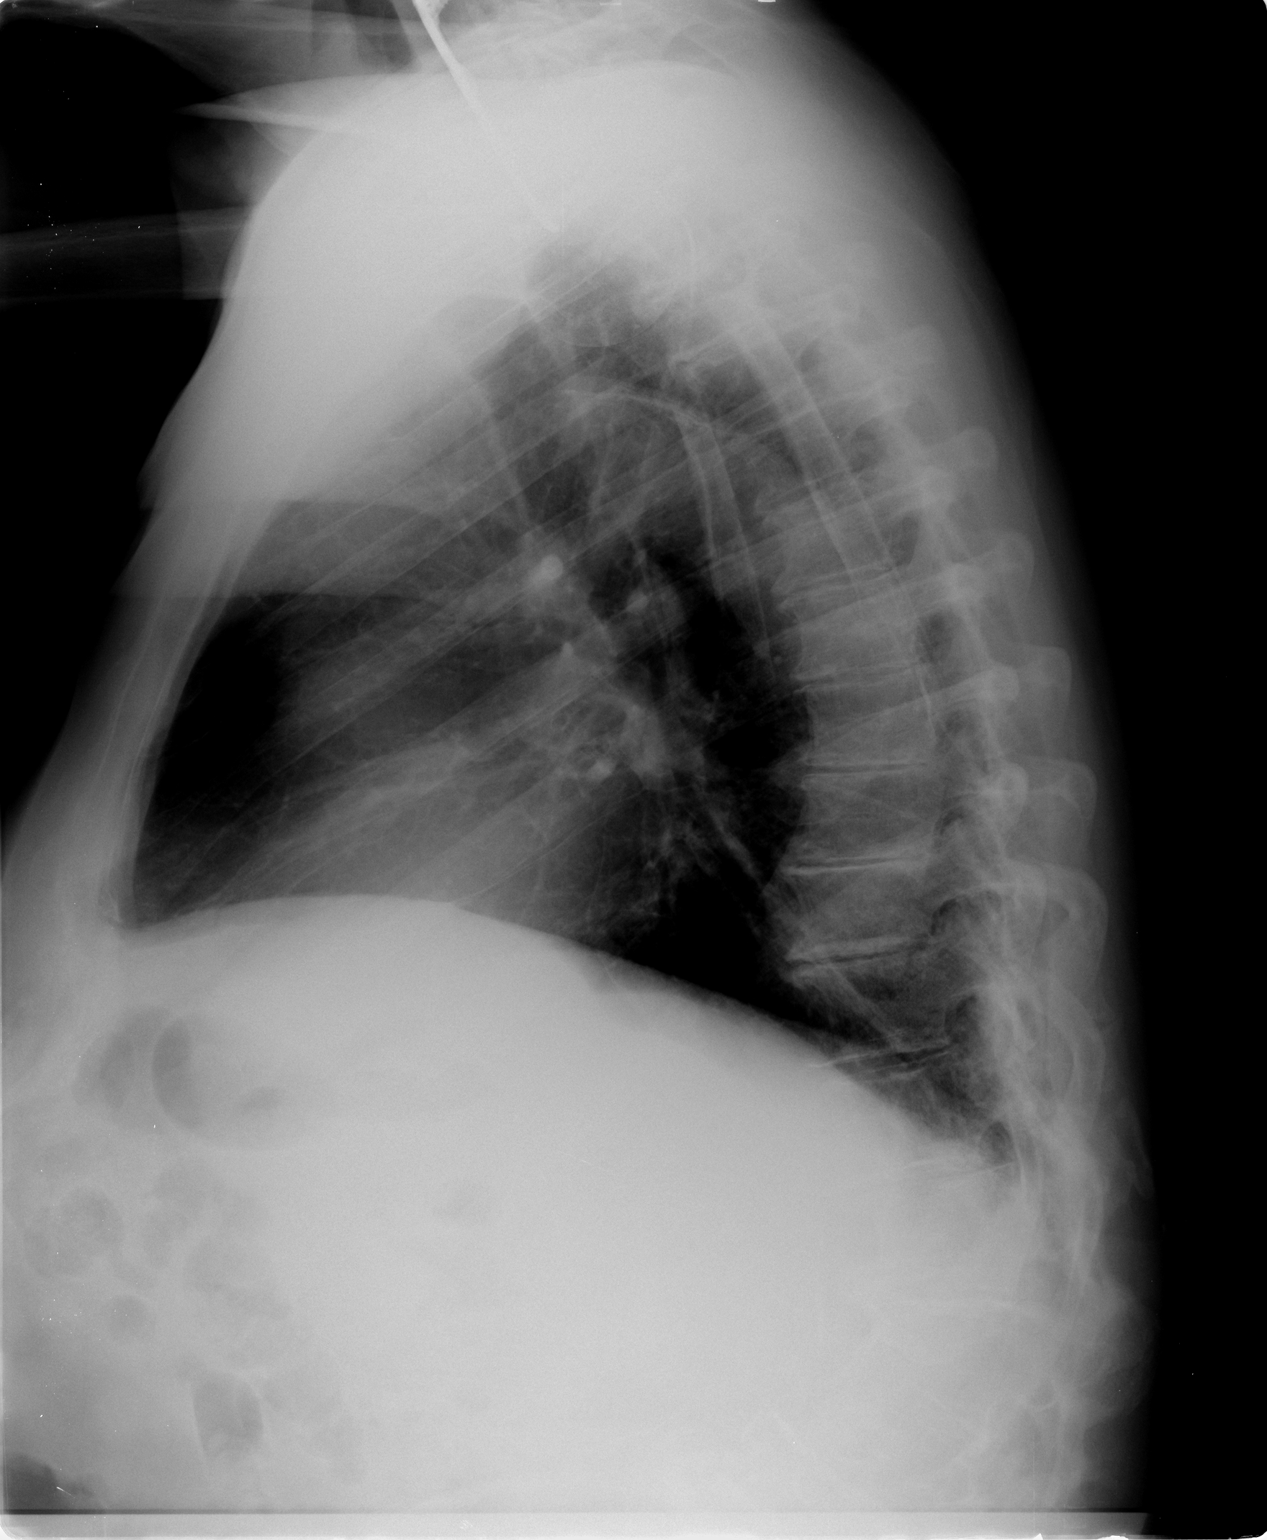

[2 of 2 positions shown; findings below may reference images not displayed]

FINDINGS: There is stable appearance of cardiac silhouette which is
in the upper range of normal size. Ectasia and nonaneurysmal
calcification of the thoracic aorta are seen.  Hilar and
mediastinal contours appear stable.  No pulmonary infiltrates or
nodules were evident. No pleural abnormality is evident.  Changes
of degenerative disc disease and degenerative spondylosis are
present.  Previous lower cervical fusion has been performed with
hardware in place.  There is kyphosis at the thoracolumbar
junction.  This appears unchanged from prior study.
IMPRESSION: No acute or active cardiopulmonary pleural abnormalities are
identified.  Chronic bony findings are described above.

## 2012-11-06 ENCOUNTER — Other Ambulatory Visit: Payer: Self-pay | Admitting: Internal Medicine

## 2012-12-07 DIAGNOSIS — M545 Low back pain, unspecified: Secondary | ICD-10-CM | POA: Diagnosis not present

## 2013-01-02 ENCOUNTER — Other Ambulatory Visit: Payer: Self-pay | Admitting: Family Medicine

## 2013-02-15 ENCOUNTER — Other Ambulatory Visit: Payer: Self-pay | Admitting: Family Medicine

## 2013-03-25 ENCOUNTER — Other Ambulatory Visit: Payer: Self-pay | Admitting: Family Medicine

## 2013-05-31 ENCOUNTER — Other Ambulatory Visit: Payer: Self-pay | Admitting: Family Medicine

## 2013-06-20 ENCOUNTER — Other Ambulatory Visit: Payer: Self-pay | Admitting: Family Medicine

## 2013-07-02 ENCOUNTER — Other Ambulatory Visit: Payer: Self-pay | Admitting: Family Medicine

## 2013-07-29 ENCOUNTER — Other Ambulatory Visit: Payer: Self-pay | Admitting: Family Medicine

## 2013-08-28 ENCOUNTER — Other Ambulatory Visit: Payer: Self-pay | Admitting: Family Medicine

## 2013-09-20 ENCOUNTER — Other Ambulatory Visit: Payer: Self-pay | Admitting: Family Medicine

## 2013-10-17 ENCOUNTER — Other Ambulatory Visit: Payer: Self-pay | Admitting: Family Medicine

## 2013-11-03 ENCOUNTER — Other Ambulatory Visit: Payer: Self-pay | Admitting: Family Medicine

## 2013-11-12 ENCOUNTER — Other Ambulatory Visit: Payer: Self-pay | Admitting: Family Medicine

## 2014-03-01 ENCOUNTER — Other Ambulatory Visit: Payer: Self-pay | Admitting: Family Medicine

## 2014-04-12 ENCOUNTER — Encounter: Payer: Self-pay | Admitting: Family Medicine

## 2014-04-12 ENCOUNTER — Ambulatory Visit (INDEPENDENT_AMBULATORY_CARE_PROVIDER_SITE_OTHER): Payer: Medicare Other | Admitting: Family Medicine

## 2014-04-12 VITALS — BP 160/90 | Temp 98.3°F | Wt 179.0 lb

## 2014-04-12 DIAGNOSIS — N289 Disorder of kidney and ureter, unspecified: Secondary | ICD-10-CM | POA: Diagnosis not present

## 2014-04-12 DIAGNOSIS — E785 Hyperlipidemia, unspecified: Secondary | ICD-10-CM

## 2014-04-12 DIAGNOSIS — I10 Essential (primary) hypertension: Secondary | ICD-10-CM

## 2014-04-12 DIAGNOSIS — E039 Hypothyroidism, unspecified: Secondary | ICD-10-CM

## 2014-04-12 DIAGNOSIS — N390 Urinary tract infection, site not specified: Secondary | ICD-10-CM | POA: Diagnosis not present

## 2014-04-12 DIAGNOSIS — I509 Heart failure, unspecified: Secondary | ICD-10-CM

## 2014-04-12 DIAGNOSIS — N35919 Unspecified urethral stricture, male, unspecified site: Secondary | ICD-10-CM | POA: Diagnosis not present

## 2014-04-12 DIAGNOSIS — F068 Other specified mental disorders due to known physiological condition: Secondary | ICD-10-CM

## 2014-04-12 DIAGNOSIS — I872 Venous insufficiency (chronic) (peripheral): Secondary | ICD-10-CM

## 2014-04-12 LAB — BASIC METABOLIC PANEL
BUN: 15 mg/dL (ref 6–23)
CHLORIDE: 107 meq/L (ref 96–112)
CO2: 26 meq/L (ref 19–32)
Calcium: 9.4 mg/dL (ref 8.4–10.5)
Creatinine, Ser: 1.2 mg/dL (ref 0.4–1.5)
GFR: 62.88 mL/min (ref >60.00)
GLUCOSE: 86 mg/dL (ref 70–99)
POTASSIUM: 4.8 meq/L (ref 3.5–5.1)
Sodium: 141 mEq/L (ref 135–145)

## 2014-04-12 LAB — CBC WITH DIFFERENTIAL/PLATELET
BASOS PCT: 0.6 % (ref 0.0–3.0)
Basophils Absolute: 0.1 10*3/uL (ref 0.0–0.1)
EOS ABS: 0.2 10*3/uL (ref 0.0–0.7)
EOS PCT: 2.7 % (ref 0.0–5.0)
HCT: 46.4 % (ref 39.0–52.0)
HEMOGLOBIN: 15.4 g/dL (ref 13.0–17.0)
LYMPHS PCT: 31.7 % (ref 12.0–46.0)
Lymphs Abs: 2.7 10*3/uL (ref 0.7–4.0)
MCHC: 33.2 g/dL (ref 30.0–36.0)
MCV: 92.8 fl (ref 78.0–100.0)
Monocytes Absolute: 0.7 10*3/uL (ref 0.1–1.0)
Monocytes Relative: 8.2 % (ref 3.0–12.0)
NEUTROS ABS: 4.9 10*3/uL (ref 1.4–7.7)
Neutrophils Relative %: 56.8 % (ref 43.0–77.0)
Platelets: 220 10*3/uL (ref 150.0–400.0)
RBC: 5 Mil/uL (ref 4.22–5.81)
RDW: 13.4 % (ref 11.5–15.5)
WBC: 8.6 10*3/uL (ref 4.0–10.5)

## 2014-04-12 LAB — TSH: TSH: 3.23 u[IU]/mL (ref 0.35–4.50)

## 2014-04-12 MED ORDER — ATENOLOL 100 MG PO TABS: ORAL_TABLET | ORAL | Status: DC

## 2014-04-12 MED ORDER — DONEPEZIL HCL 5 MG PO TABS: 5.0000 mg | ORAL_TABLET | Freq: Every day | ORAL | Status: DC

## 2014-04-12 MED ORDER — SIMVASTATIN 40 MG PO TABS: ORAL_TABLET | ORAL | Status: DC

## 2014-04-12 MED ORDER — TORSEMIDE 20 MG PO TABS: ORAL_TABLET | ORAL | Status: DC

## 2014-04-12 MED ORDER — LEVOTHYROXINE SODIUM 50 MCG PO TABS: ORAL_TABLET | ORAL | Status: DC

## 2014-04-12 NOTE — Progress Notes (Signed)
   Subjective:    Patient ID: Wayne [name], male    DOB: 06/16/1926, 80 y.o.   MRN: [id number]  HPI Wayne [name] is a 80 year old male married nonsmoker who comes in today accompanied by his wife for evaluation of multiple medical problems  It's been over a year since he's been seen here. We outlined a followup every 3 months please been nonadherent. He states his worse problem is for the past week he's not been able to get his catheter and. He's been self catheterizing many years for BPH  He's also out of all his medications. He's been taking his wife's Toprol. He continues to have severe edema of lower extremities however in the morning when he gets up he says his legs are normal. He denies any chest pain or shortness of breath.   Review of Systems    review of systems otherwise negative Objective:   Physical Exam  Well-developed well-nourished male no acute distress vital signs stable he is afebrile BP 160/90 cardiopulmonary exam normal no crackles peripheral exam shows 4+ edema both lower extremities right worse than left      Assessment & Plan:  Hypertension restart medication  Hyperlipidemia continue Zocor  Hypothyroidism continue Synthroid  For full edema Demadex 20 mg daily  Dementia continue Aricept  BPH with outlet obstruction,,,,,,,,,,,, I called in Dr. Tacey Ruiz and his team were kind enough to see him today

## 2014-04-12 NOTE — Progress Notes (Signed)
Pre visit review using our clinic review tool, if applicable. No additional management support is needed unless otherwise documented below in the visit note. 

## 2014-04-12 NOTE — Patient Instructions (Signed)
Restart your medications  Daily weights  Elevates her legs as much as possible  Followup in 2 weeks  Go to the urology Center now to see Dr. Merleen Milliner today

## 2014-04-16 ENCOUNTER — Telehealth: Payer: Self-pay | Admitting: Family Medicine

## 2014-04-16 NOTE — Telephone Encounter (Signed)
Relevant patient education assigned to patient using Emmi. ° °

## 2014-04-26 ENCOUNTER — Ambulatory Visit: Payer: Medicare Other | Admitting: Family Medicine

## 2014-05-25 ENCOUNTER — Other Ambulatory Visit: Payer: Self-pay | Admitting: Family Medicine

## 2014-07-01 ENCOUNTER — Other Ambulatory Visit: Payer: Self-pay | Admitting: Family Medicine

## 2014-10-23 ENCOUNTER — Other Ambulatory Visit: Payer: Self-pay | Admitting: Family Medicine

## 2014-10-25 ENCOUNTER — Encounter (HOSPITAL_COMMUNITY): Payer: Self-pay | Admitting: Anesthesiology

## 2014-12-04 ENCOUNTER — Other Ambulatory Visit: Payer: Self-pay | Admitting: Family Medicine

## 2015-01-24 ENCOUNTER — Other Ambulatory Visit: Payer: Self-pay | Admitting: Family Medicine

## 2015-02-28 ENCOUNTER — Ambulatory Visit (INDEPENDENT_AMBULATORY_CARE_PROVIDER_SITE_OTHER): Payer: Medicare Other | Admitting: Family Medicine

## 2015-02-28 ENCOUNTER — Encounter: Payer: Self-pay | Admitting: Family Medicine

## 2015-02-28 VITALS — BP 128/78 | Temp 98.1°F

## 2015-02-28 DIAGNOSIS — I10 Essential (primary) hypertension: Secondary | ICD-10-CM | POA: Diagnosis not present

## 2015-02-28 DIAGNOSIS — I5032 Chronic diastolic (congestive) heart failure: Secondary | ICD-10-CM | POA: Diagnosis not present

## 2015-02-28 DIAGNOSIS — E039 Hypothyroidism, unspecified: Secondary | ICD-10-CM | POA: Diagnosis not present

## 2015-02-28 DIAGNOSIS — Z23 Encounter for immunization: Secondary | ICD-10-CM

## 2015-02-28 DIAGNOSIS — G4700 Insomnia, unspecified: Secondary | ICD-10-CM

## 2015-02-28 DIAGNOSIS — E785 Hyperlipidemia, unspecified: Secondary | ICD-10-CM

## 2015-02-28 DIAGNOSIS — I872 Venous insufficiency (chronic) (peripheral): Secondary | ICD-10-CM

## 2015-02-28 LAB — POCT URINALYSIS DIPSTICK
Bilirubin, UA: NEGATIVE
Blood, UA: NEGATIVE
GLUCOSE UA: NEGATIVE
Ketones, UA: NEGATIVE
Leukocytes, UA: NEGATIVE
Nitrite, UA: NEGATIVE
Protein, UA: NEGATIVE
SPEC GRAV UA: 1.015
Urobilinogen, UA: 0.2
pH, UA: 5

## 2015-02-28 LAB — CBC WITH DIFFERENTIAL/PLATELET
BASOS PCT: 0.4 % (ref 0.0–3.0)
Basophils Absolute: 0 10*3/uL (ref 0.0–0.1)
EOS ABS: 0.4 10*3/uL (ref 0.0–0.7)
Eosinophils Relative: 3.1 % (ref 0.0–5.0)
HCT: 46.9 % (ref 39.0–52.0)
Hemoglobin: 16 g/dL (ref 13.0–17.0)
Lymphocytes Relative: 34.3 % (ref 12.0–46.0)
Lymphs Abs: 4.4 10*3/uL — ABNORMAL HIGH (ref 0.7–4.0)
MCHC: 34.2 g/dL (ref 30.0–36.0)
MCV: 90.6 fl (ref 78.0–100.0)
Monocytes Absolute: 1 10*3/uL (ref 0.1–1.0)
Monocytes Relative: 7.8 % (ref 3.0–12.0)
NEUTROS ABS: 7 10*3/uL (ref 1.4–7.7)
Neutrophils Relative %: 54.4 % (ref 43.0–77.0)
Platelets: 245 10*3/uL (ref 150.0–400.0)
RBC: 5.17 Mil/uL (ref 4.22–5.81)
RDW: 14 % (ref 11.5–15.5)
WBC: 12.8 10*3/uL — ABNORMAL HIGH (ref 4.0–10.5)

## 2015-02-28 LAB — TSH: TSH: 5.59 u[IU]/mL — AB (ref 0.35–4.50)

## 2015-02-28 LAB — BASIC METABOLIC PANEL
BUN: 19 mg/dL (ref 6–23)
CALCIUM: 9.8 mg/dL (ref 8.4–10.5)
CO2: 30 mEq/L (ref 19–32)
CREATININE: 1.44 mg/dL (ref 0.40–1.50)
Chloride: 104 mEq/L (ref 96–112)
GFR: 49.38 mL/min — ABNORMAL LOW (ref >60.00)
GLUCOSE: 89 mg/dL (ref 70–99)
Potassium: 4.6 mEq/L (ref 3.5–5.1)
Sodium: 139 mEq/L (ref 135–145)

## 2015-02-28 MED ORDER — SIMVASTATIN 40 MG PO TABS: ORAL_TABLET | ORAL | Status: DC

## 2015-02-28 MED ORDER — LORAZEPAM 0.5 MG PO TABS: 0.5000 mg | ORAL_TABLET | Freq: Two times a day (BID) | ORAL | Status: DC

## 2015-02-28 MED ORDER — LEVOTHYROXINE SODIUM 50 MCG PO TABS: ORAL_TABLET | ORAL | Status: DC

## 2015-02-28 MED ORDER — ATENOLOL 50 MG PO TABS: 50.0000 mg | ORAL_TABLET | Freq: Every day | ORAL | Status: DC

## 2015-02-28 MED ORDER — TORSEMIDE 20 MG PO TABS: ORAL_TABLET | ORAL | Status: DC

## 2015-02-28 NOTE — Addendum Note (Signed)
Addended by: Westley Hummer B on: 02/28/2015 04:24 PM   Modules accepted: Orders

## 2015-02-28 NOTE — Progress Notes (Signed)
Pre visit review using our clinic review tool, if applicable. No additional management support is needed unless otherwise documented below in the visit note. 

## 2015-02-28 NOTE — Patient Instructions (Signed)
Labs today,,,,,,,,,, we will call you the report if there is anything unusual  Decrease the Tenormin to 50 mg daily  Continue your other medications  Follow-up in one year sooner if any problems  Call and see your urologist and neurologist,,,,,,, this summer,

## 2015-02-28 NOTE — Progress Notes (Signed)
   Subjective:    Patient ID: Wayne [name], male    DOB: 1926/04/20, 81 y.o.   MRN: [id number]  HPI Wayne [name] is a 81 year old married male nonsmoker who comes in today for evaluation of multiple issues  He takes Tenormin 100 mg daily because of a history of hypertension and congestive heart failure. Weight is down 8 pounds to 171 pounds. BP 120/78 pulse 70 and regular. He is lightheaded when he gets up sometimes  He takes Ativan at bedtime when necessary for sleep  He takes Synthroid 50 g for hypothyroidism  He takes Zocor 40 mg daily for hyperlipidemia and Demadex 20 mg in the morning because of congestive heart failure hypertension and chronic venous insufficiency. It's worse on his right leg in his left.  He takes Cipro when necessary for urinary tract infections he self catheterizes weekly.  He's followed in neurology because of mild dementia. He's taken [employer] 5 mg daily. He says it causes constipation. Recommend he take a stool softener.   Review of Systems Review of systems otherwise negative except for neuropathy lower extremities. He's had numerous back operations. He sees Dr. Maryjean Ka at the neurosurgical sooner when necessary    Objective:   Physical Exam  Well-developed well-nourished male no acute distress vital signs stable weight 171 BP 120/78 pulse 70 and regular lungs are clear to auscultation cardiac exam was normal. Lower extremity shows trace edema left 2+ on the right but he has High stockings on. Advised to only wear a golf socks in the future      Assessment & Plan:  Hypothyroidism.... Continue Synthroid check labs  Hypertension/congestive heart failure......... BP low 128/78 and is lightheaded when he stands up..... Decrease beta blocker to 50 mg daily  Dementia,,,,,,,, follow-up by neurology  BPH,,,,,,,,,, self catheterizes weekly,,,,, follow-up by urology yearly.  Hyperlipidemia,,,,,,, continue Zocor 40 mg  Peripheral edema worse on the  right....... stop the calf I stockings........Marland Kitchen Demadex 20 mg daily

## 2015-03-17 ENCOUNTER — Other Ambulatory Visit: Payer: Self-pay | Admitting: Family Medicine

## 2015-03-21 ENCOUNTER — Other Ambulatory Visit: Payer: Self-pay | Admitting: Family Medicine

## 2015-03-31 ENCOUNTER — Other Ambulatory Visit: Payer: Self-pay | Admitting: Family Medicine

## 2015-07-27 ENCOUNTER — Other Ambulatory Visit: Payer: Self-pay | Admitting: Family Medicine

## 2015-10-29 ENCOUNTER — Other Ambulatory Visit: Payer: Self-pay | Admitting: Family Medicine

## 2015-11-11 ENCOUNTER — Encounter: Payer: Self-pay | Admitting: Family Medicine

## 2015-11-11 ENCOUNTER — Ambulatory Visit (INDEPENDENT_AMBULATORY_CARE_PROVIDER_SITE_OTHER): Payer: Medicare Other | Admitting: Family Medicine

## 2015-11-11 VITALS — BP 110/70 | Temp 98.1°F | Wt 165.0 lb

## 2015-11-11 DIAGNOSIS — I872 Venous insufficiency (chronic) (peripheral): Secondary | ICD-10-CM

## 2015-11-11 DIAGNOSIS — E785 Hyperlipidemia, unspecified: Secondary | ICD-10-CM | POA: Diagnosis not present

## 2015-11-11 DIAGNOSIS — E039 Hypothyroidism, unspecified: Secondary | ICD-10-CM

## 2015-11-11 DIAGNOSIS — Z23 Encounter for immunization: Secondary | ICD-10-CM

## 2015-11-11 DIAGNOSIS — I5032 Chronic diastolic (congestive) heart failure: Secondary | ICD-10-CM

## 2015-11-11 DIAGNOSIS — R413 Other amnesia: Secondary | ICD-10-CM | POA: Diagnosis not present

## 2015-11-11 DIAGNOSIS — I10 Essential (primary) hypertension: Secondary | ICD-10-CM | POA: Diagnosis not present

## 2015-11-11 LAB — POCT URINALYSIS DIPSTICK
BILIRUBIN UA: NEGATIVE
GLUCOSE UA: NEGATIVE
KETONES UA: NEGATIVE
Leukocytes, UA: NEGATIVE
Nitrite, UA: NEGATIVE
Protein, UA: NEGATIVE
RBC UA: NEGATIVE
SPEC GRAV UA: 1.015
UROBILINOGEN UA: 0.2
pH, UA: 5

## 2015-11-11 MED ORDER — LORAZEPAM 0.5 MG PO TABS: ORAL_TABLET | ORAL | Status: DC

## 2015-11-11 MED ORDER — LEVOTHYROXINE SODIUM 50 MCG PO TABS: ORAL_TABLET | ORAL | Status: DC

## 2015-11-11 MED ORDER — SIMVASTATIN 40 MG PO TABS: ORAL_TABLET | ORAL | Status: DC

## 2015-11-11 MED ORDER — DONEPEZIL HCL 5 MG PO TABS: ORAL_TABLET | ORAL | Status: DC

## 2015-11-11 MED ORDER — ATENOLOL 50 MG PO TABS: 50.0000 mg | ORAL_TABLET | Freq: Every day | ORAL | Status: DC

## 2015-11-11 MED ORDER — TORSEMIDE 20 MG PO TABS: 20.0000 mg | ORAL_TABLET | Freq: Every day | ORAL | Status: DC

## 2015-11-11 NOTE — Patient Instructions (Signed)
Continue current medications for now  Labs today........ I will call you the report in a couple days  Return to get you set up for cardiac evaluation  I recommend the thigh-high stockings as outlined

## 2015-11-11 NOTE — Progress Notes (Signed)
   Subjective:    Patient ID: Wayne [name], male    DOB: [birth year], 82 y.o.   MRN: [id number]  HPI Wayne [name] is a delightful,,,,,,,,,, but not compliant,,,,, 82 year old married male nonsmoker who comes in today after a urinary half absence for evaluation of all his medical problems in a 15 minute visit  He has a history of hypertension and is on Tenormin 50 mg daily. BP today is 110/70  His history short-term memory losses on Aricept 5 mg daily.  He's on Zocor 40 mg daily for hyperlipidemia Demadex 20 mg for fluid retention related to venous insufficiency and congestive heart failure. It's worse on the right than the left.  His weight is year and half ago was 179 is down to 165.   Review of Systems    review of systems constipation Objective:   Physical Exam  Well-developed well-nourished male no acute distress vital signs stable he is afebrile cardiopulmonary exam normal except for distant heart sounds. Peripheral pulses are once plus and symmetrical 2+ edema on the right 1+ on the left.      Assessment & Plan:  Congestive heart failure........ seems stable.......Marland Kitchen we will get basic labs renew medications and get him set up for cardiac evaluation which we've tried to do in the past but is not gone.  Hypothyroidism refill Synthroid  History of short-term memory loss continue Aricept  Hyperlipidemia continue Zocor  Sleep dysfunction continue Ativan

## 2015-11-11 NOTE — Progress Notes (Signed)
Pre visit review using our clinic review tool, if applicable. No additional management support is needed unless otherwise documented below in the visit note. 

## 2015-11-12 LAB — BASIC METABOLIC PANEL
BUN: 17 mg/dL (ref 6–23)
CALCIUM: 9.5 mg/dL (ref 8.4–10.5)
CO2: 29 meq/L (ref 19–32)
Chloride: 104 mEq/L (ref 96–112)
Creatinine, Ser: 1.44 mg/dL (ref 0.40–1.50)
GFR: 49.3 mL/min — ABNORMAL LOW (ref >60.00)
GLUCOSE: 93 mg/dL (ref 70–99)
POTASSIUM: 4.4 meq/L (ref 3.5–5.1)
SODIUM: 145 meq/L (ref 135–145)

## 2015-11-12 LAB — HEPATIC FUNCTION PANEL
ALBUMIN: 4.2 g/dL (ref 3.5–5.2)
ALK PHOS: 84 U/L (ref 39–117)
ALT: 16 U/L (ref 0–53)
AST: 19 U/L (ref 0–37)
BILIRUBIN TOTAL: 1.2 mg/dL (ref 0.2–1.2)
Bilirubin, Direct: 0.2 mg/dL (ref 0.0–0.3)
Total Protein: 6.8 g/dL (ref 6.0–8.3)

## 2015-11-12 LAB — CBC WITH DIFFERENTIAL/PLATELET
BASOS PCT: 0.8 % (ref 0.0–3.0)
Basophils Absolute: 0.1 10*3/uL (ref 0.0–0.1)
EOS PCT: 1.8 % (ref 0.0–5.0)
Eosinophils Absolute: 0.3 10*3/uL (ref 0.0–0.7)
HCT: 48.8 % (ref 39.0–52.0)
Hemoglobin: 16.3 g/dL (ref 13.0–17.0)
LYMPHS ABS: 4.2 10*3/uL — AB (ref 0.7–4.0)
Lymphocytes Relative: 29 % (ref 12.0–46.0)
MCHC: 33.3 g/dL (ref 30.0–36.0)
MCV: 92.4 fl (ref 78.0–100.0)
MONOS PCT: 8.3 % (ref 3.0–12.0)
Monocytes Absolute: 1.2 10*3/uL — ABNORMAL HIGH (ref 0.1–1.0)
NEUTROS PCT: 60.1 % (ref 43.0–77.0)
Neutro Abs: 8.7 10*3/uL — ABNORMAL HIGH (ref 1.4–7.7)
Platelets: 237 10*3/uL (ref 150.0–400.0)
RBC: 5.28 Mil/uL (ref 4.22–5.81)
RDW: 13.4 % (ref 11.5–15.5)
WBC: 14.5 10*3/uL — ABNORMAL HIGH (ref 4.0–10.5)

## 2015-11-12 LAB — TSH: TSH: 3.98 u[IU]/mL (ref 0.35–4.50)

## 2015-11-29 ENCOUNTER — Ambulatory Visit (INDEPENDENT_AMBULATORY_CARE_PROVIDER_SITE_OTHER): Payer: Medicare Other | Admitting: Cardiology

## 2015-11-29 ENCOUNTER — Encounter: Payer: Self-pay | Admitting: Cardiology

## 2015-11-29 VITALS — BP 134/80 | HR 62 | Ht 62.0 in | Wt 165.1 lb

## 2015-11-29 DIAGNOSIS — I10 Essential (primary) hypertension: Secondary | ICD-10-CM | POA: Diagnosis not present

## 2015-11-29 NOTE — Progress Notes (Signed)
Cardiology Office Note   Date:  11/29/2015   ID:  Wayne [name], DOB 1926-08-22, MRN [id number]  PCP:  Joycelyn Man, MD  Cardiologist:   Minus Breeding, MD   Chief Complaint  Patient presents with  . Edema      History of Present Illness: Wayne [name] is a 82 y.o. male who presents for evaluation of lower extremity swelling. I saw him greater than 3 years ago preoperatively prior to getting a unit placed for chronic back pain management. He's not had any significant cardiac issues. He had a negative stress test years ago. He has a right bundle branch block. He's had lower extremity swelling. This has been somewhat stable by his report.  He says it goes down when he keeps his feet elevated. He wears compression stockings up to the thigh. He doesn't particularly watch his salt. He doesn't however have any symptoms such as shortness of breath, PND or orthopnea. He has not had any palpitations, presyncope or syncope. He has had no PND or orthopnea.     Past Medical History  Diagnosis Date  . CHF (congestive heart failure) ([place])   . Hyperlipidemia     takes Simvastatin daily  . Back pain     chronic with neck pain  . Cancer (Wayne [name])     skin  . MRSA (methicillin resistant staph aureus) culture positive     Per patient tested on 10/07/11.  . Scoliosis   . Hypertension     takes atenolol daily  . Hx of seasonal allergies   . Peripheral edema     takes Torsemide daily  . Osteoporosis   . GERD (gastroesophageal reflux disease)     doesn't require medication  . Constipation     related to medication  . Enlarged prostate     self caths once every 1-2wks  . Hypothyroidism     takes Synthroid daily  . Insomnia     related to pain  . Dementia     takes Aricept nightly  . Anxiety     takes Atrivan daily    Past Surgical History  Procedure Laterality Date  . Cervical disc surgery      x1  . Lumbar disc surgery      x2  . Cataracts      bilateral  . Hernia  repair      Right inguinal  . Toe surgery      right great toe  . Colonoscopy    . Esophagogastroduodenoscopy    . Transurethral resection of prostate      15+yrs ago  . Abdominal cyst      removed-2012;MRSA done by Dr.Gross  . Cataract extraction       Current Outpatient Prescriptions  Medication Sig Dispense Refill  . aspirin EC 81 MG tablet Take 81 mg by mouth daily.    Marland Kitchen atenolol (TENORMIN) 50 MG tablet Take 1 tablet (50 mg total) by mouth daily. 90 tablet 3  . ciprofloxacin (CIPRO) 250 MG tablet Take 250 mg by mouth daily as needed.    . donepezil (ARICEPT) 5 MG tablet TAKE 1 TABLET (5 MG TOTAL) BY MOUTH AT BEDTIME. 100 tablet 3  . levothyroxine (SYNTHROID, LEVOTHROID) 50 MCG tablet TAKE 1 TABLET BY MOUTH ONCE DAILY *PT NEEDS OFFICE VISIT* 90 tablet 1  . LORazepam (ATIVAN) 0.5 MG tablet One by mouth daily at bedtime when necessary for sleep 60 tablet 1  . simvastatin (ZOCOR) 40 MG tablet TAKE  1 TABLET BY MOUTH EVERY NIGHT 100 tablet 3  . torsemide (DEMADEX) 20 MG tablet Take 1 tablet (20 mg total) by mouth daily. 90 tablet 3   No current facility-administered medications for this visit.    Allergies:   Doxazosin mesylate; Propoxyphene hcl; and Penicillins    Social History:  The patient  reports that he has never smoked. He has never used smokeless tobacco. He reports that he does not drink alcohol or use illicit drugs.   Family History:  The patient's family history includes Diabetes in his other; Heart disease (age of onset: 35) in his mother; Hypertension in his other; Stroke in his father. There is no history of Anesthesia problems, Hypotension, Malignant hyperthermia, or Pseudochol deficiency.    ROS:  Please see the history of present illness.   Otherwise, review of systems are positive for constipation.   All other systems are reviewed and negative.    PHYSICAL EXAM: VS:  BP 134/80 mmHg  Pulse 62  Ht 5\' 2"  (1.575 m)  Wt 165 lb 1 oz (74.872 kg)  BMI 30.18 kg/m2  , BMI Body mass index is 30.18 kg/(m^2). GENERAL:  Well appearing HEENT:  Pupils equal round and reactive, fundi not visualized, oral mucosa unremarkable NECK:  No jugular venous distention, waveform within normal limits, carotid upstroke brisk and symmetric, no bruits, no thyromegaly LYMPHATICS:  No cervical, inguinal adenopathy LUNGS:  Clear to auscultation bilaterally BACK:  No CVA tenderness CHEST:  Unremarkable HEART:  PMI not displaced or sustained,S1 and S2 within normal limits, no S3, no S4, no clicks, no rubs, no murmurs ABD:  Flat, positive bowel sounds normal in frequency in pitch, no bruits, no rebound, no guarding, no midline pulsatile mass, no hepatomegaly, no splenomegaly EXT:  2 plus pulses upper with decreased DP/PT bilateral, moderate right greater than left edema edema, no cyanosis no clubbing SKIN:  No rashes no nodules NEURO:  Cranial nerves II through XII grossly intact, motor grossly intact throughout PSYCH:  Cognitively intact, oriented to person place and time    EKG:  EKG is ordered today. The ekg ordered today demonstrates sinus rhythm, rate 63, axis within normal limits, right bundle branch block, anterior T-wave inversions unchanged previous.   Recent Labs: 11/11/2015: ALT 16; BUN 17; Creatinine, Ser 1.44; Hemoglobin 16.3; Platelets 237.0; Potassium 4.4; Sodium 145; TSH 3.98    Lipid Panel    Component Value Date/Time   CHOL 199 06/19/2011 1521   TRIG 337.0* 06/19/2011 1521   HDL 40.20 06/19/2011 1521   CHOLHDL 5 06/19/2011 1521   VLDL 67.4* 06/19/2011 1521   LDLCALC 135* 08/21/2008 1010   LDLDIRECT 114.8 06/19/2011 1521      Wt Readings from Last 3 Encounters:  11/29/15 165 lb 1 oz (74.872 kg)  11/11/15 165 lb (74.844 kg)  04/12/14 179 lb (81.194 kg)      Other studies Reviewed: Additional studies/ records that were reviewed today include: None. Review of the above records demonstrates:  Please see elsewhere in the note.     ASSESSMENT  AND PLAN:  EDEMA:  I don't strongly suspect heart failure. I would suspect venous insufficiency. We talked about conservative management of this. He needs to avoid salt more. We talked about possibly taking a neck or Demadex for a few days. Otherwise not make any changes to his medicines.  HTN:  I reviewed his blood pressure diary his blood pressure is well controlled. No change in therapy is indicated.   Current medicines are reviewed  at length with the patient today.  The patient does not have concerns regarding medicines.  The following changes have been made:  no change  Labs/ tests ordered today include:   Orders Placed This Encounter  Procedures  . EKG 12-Lead     Disposition:   FU with me as needed.      Signed, Minus Breeding, MD  11/29/2015 4:58 PM    [facility]

## 2015-11-29 NOTE — Patient Instructions (Signed)
Your physician recommends that you schedule a follow-up appointment in: AS NEEDED  

## 2016-01-24 ENCOUNTER — Other Ambulatory Visit: Payer: Self-pay | Admitting: Family Medicine

## 2016-03-07 ENCOUNTER — Other Ambulatory Visit: Payer: Self-pay | Admitting: Family Medicine

## 2016-04-02 ENCOUNTER — Other Ambulatory Visit: Payer: Self-pay | Admitting: Family Medicine

## 2016-04-05 ENCOUNTER — Other Ambulatory Visit: Payer: Self-pay | Admitting: Family Medicine

## 2016-04-23 ENCOUNTER — Other Ambulatory Visit: Payer: Self-pay | Admitting: Emergency Medicine

## 2016-04-23 DIAGNOSIS — E785 Hyperlipidemia, unspecified: Secondary | ICD-10-CM

## 2016-05-29 ENCOUNTER — Other Ambulatory Visit: Payer: Self-pay | Admitting: Family Medicine

## 2016-05-29 NOTE — Telephone Encounter (Signed)
Rx refill sent to pharmacy. 

## 2016-06-05 ENCOUNTER — Other Ambulatory Visit: Payer: Self-pay | Admitting: Family Medicine

## 2016-06-05 ENCOUNTER — Other Ambulatory Visit: Payer: Self-pay

## 2016-06-25 ENCOUNTER — Other Ambulatory Visit: Payer: Self-pay | Admitting: Family Medicine

## 2016-07-31 ENCOUNTER — Other Ambulatory Visit: Payer: Self-pay | Admitting: Family Medicine

## 2016-08-01 ENCOUNTER — Other Ambulatory Visit: Payer: Self-pay | Admitting: Family Medicine

## 2016-08-07 ENCOUNTER — Other Ambulatory Visit: Payer: Self-pay

## 2016-08-07 MED ORDER — LORAZEPAM 0.5 MG PO TABS: ORAL_TABLET | ORAL | 0 refills | Status: DC

## 2016-08-10 ENCOUNTER — Ambulatory Visit (INDEPENDENT_AMBULATORY_CARE_PROVIDER_SITE_OTHER): Payer: Medicare Other | Admitting: Family Medicine

## 2016-08-10 ENCOUNTER — Encounter: Payer: Self-pay | Admitting: Family Medicine

## 2016-08-10 VITALS — BP 160/90 | HR 60 | Temp 97.6°F | Wt 156.3 lb

## 2016-08-10 DIAGNOSIS — F5101 Primary insomnia: Secondary | ICD-10-CM

## 2016-08-10 DIAGNOSIS — E039 Hypothyroidism, unspecified: Secondary | ICD-10-CM | POA: Diagnosis not present

## 2016-08-10 DIAGNOSIS — I10 Essential (primary) hypertension: Secondary | ICD-10-CM | POA: Diagnosis not present

## 2016-08-10 DIAGNOSIS — Z23 Encounter for immunization: Secondary | ICD-10-CM | POA: Diagnosis not present

## 2016-08-10 DIAGNOSIS — I872 Venous insufficiency (chronic) (peripheral): Secondary | ICD-10-CM

## 2016-08-10 DIAGNOSIS — E7800 Pure hypercholesterolemia, unspecified: Secondary | ICD-10-CM

## 2016-08-10 DIAGNOSIS — R413 Other amnesia: Secondary | ICD-10-CM

## 2016-08-10 MED ORDER — TORSEMIDE 20 MG PO TABS: 20.0000 mg | ORAL_TABLET | Freq: Every day | ORAL | 3 refills | Status: DC

## 2016-08-10 MED ORDER — ATENOLOL 50 MG PO TABS: 50.0000 mg | ORAL_TABLET | Freq: Every day | ORAL | 3 refills | Status: DC

## 2016-08-10 MED ORDER — LORAZEPAM 0.5 MG PO TABS: ORAL_TABLET | ORAL | 3 refills | Status: DC

## 2016-08-10 MED ORDER — SIMVASTATIN 40 MG PO TABS: ORAL_TABLET | ORAL | 3 refills | Status: DC

## 2016-08-10 MED ORDER — LEVOTHYROXINE SODIUM 50 MCG PO TABS
ORAL_TABLET | ORAL | 3 refills | Status: DC
Start: 2016-08-10 — End: 2016-11-18

## 2016-08-10 MED ORDER — DONEPEZIL HCL 5 MG PO TABS: ORAL_TABLET | ORAL | 3 refills | Status: DC

## 2016-08-10 NOTE — Patient Instructions (Signed)
Subcutaneous right foot in water daily,,,,,,,,, apply antibiotic ointment and a Band-Aid,,,,,,, do this for a week or 10 days until it heals  Take Curby to your favorite nail person.......... he needs his nails filed weekly  I refilled all your medications  Set up a time for physical examination as soon as possible,,,,,,,,,,, it'll probably be in the spring

## 2016-08-10 NOTE — Progress Notes (Signed)
Friends and 82 year old married male nonsmoker who comes in today for evaluation of multiple issues  He was supposed to come back for physical examination and never did. We saw him last January for an office visit it which time we concern that his peripheral edema has increased and it might be congestive heart failure. We send him to [facility]. [facility] for cardiac evaluation. He felt like it was mostly venous insufficiency and congestive heart failure and recommended no change in his medication  He comes in today saying he needs his medicine refilled. His last physical examination was year and half ago. He takes Tenormin 50 mg daily for hypertension BP at home 140/80  He takes Aricept for memory loss hypothyroidism he takes Synthroid he also takes Zocor Demadex and Ativan daily at bedtime for sleep.  He's concerned today about his right great toenail that fell off. Is a fungal infection all his toenails. He is advised to soak him in follow weekly but that's not been accomplished. Advised his wife to take him to a nail specialist. He also has concerns about some spots on his scalp spots on his arms and multiple other issues.  He says having difficulty walking because he can't get a battery for his stimulator. Advised him to call his neurosurgeon Dr. Ellene Route  Physical examination vital signs stable he is afebrile examination of the lower extremity shows both feet to be bluish tint although it's very cold in here this morning. He has a 2+ popliteal and a 1+ dorsalis pedis and posterior tibial pulse. The right great toenail is gone.  Spontaneous avulsion right great toenail secondary to fungal infection......... advised to soak her with antibiotic ointment and a Band-Aid for week to 10 days. Also advised take her to nail person to have his nails filed weekly  Hypertension at goal........ continue current medication  Hypothyroidism.............. continue Synthroid  Hyperlipidemia........ continue Zocor  Venous  insufficiency............. continue Demadex..... He declines to wear the thigh-high stockings  Meds refilled is asked come back for physical examination as soon as possible

## 2016-08-10 NOTE — Progress Notes (Signed)
Pre visit review using our clinic review tool, if applicable. No additional management support is needed unless otherwise documented below in the visit note. 

## 2016-11-18 ENCOUNTER — Encounter: Payer: Self-pay | Admitting: Family Medicine

## 2016-11-18 ENCOUNTER — Ambulatory Visit (INDEPENDENT_AMBULATORY_CARE_PROVIDER_SITE_OTHER): Payer: Medicare Other | Admitting: Family Medicine

## 2016-11-18 VITALS — BP 92/62 | Temp 97.3°F | Ht 61.0 in | Wt 159.6 lb

## 2016-11-18 DIAGNOSIS — R413 Other amnesia: Secondary | ICD-10-CM

## 2016-11-18 DIAGNOSIS — N289 Disorder of kidney and ureter, unspecified: Secondary | ICD-10-CM

## 2016-11-18 DIAGNOSIS — I10 Essential (primary) hypertension: Secondary | ICD-10-CM

## 2016-11-18 DIAGNOSIS — F5101 Primary insomnia: Secondary | ICD-10-CM | POA: Diagnosis not present

## 2016-11-18 DIAGNOSIS — E7800 Pure hypercholesterolemia, unspecified: Secondary | ICD-10-CM | POA: Diagnosis not present

## 2016-11-18 DIAGNOSIS — I872 Venous insufficiency (chronic) (peripheral): Secondary | ICD-10-CM

## 2016-11-18 DIAGNOSIS — E039 Hypothyroidism, unspecified: Secondary | ICD-10-CM

## 2016-11-18 LAB — CBC WITH DIFFERENTIAL/PLATELET
BASOS PCT: 0.6 % (ref 0.0–3.0)
Basophils Absolute: 0.1 10*3/uL (ref 0.0–0.1)
EOS PCT: 1.5 % (ref 0.0–5.0)
Eosinophils Absolute: 0.1 10*3/uL (ref 0.0–0.7)
HEMATOCRIT: 46.1 % (ref 39.0–52.0)
HEMOGLOBIN: 15.6 g/dL (ref 13.0–17.0)
LYMPHS PCT: 25.6 % (ref 12.0–46.0)
Lymphs Abs: 2.3 10*3/uL (ref 0.7–4.0)
MCHC: 33.9 g/dL (ref 30.0–36.0)
MCV: 92.3 fl (ref 78.0–100.0)
MONOS PCT: 7.3 % (ref 3.0–12.0)
Monocytes Absolute: 0.6 10*3/uL (ref 0.1–1.0)
Neutro Abs: 5.7 10*3/uL (ref 1.4–7.7)
Neutrophils Relative %: 65 % (ref 43.0–77.0)
Platelets: 219 10*3/uL (ref 150.0–400.0)
RBC: 4.99 Mil/uL (ref 4.22–5.81)
RDW: 13.3 % (ref 11.5–15.5)
WBC: 8.8 10*3/uL (ref 4.0–10.5)

## 2016-11-18 LAB — BASIC METABOLIC PANEL
BUN: 25 mg/dL — ABNORMAL HIGH (ref 6–23)
CALCIUM: 9.3 mg/dL (ref 8.4–10.5)
CHLORIDE: 105 meq/L (ref 96–112)
CO2: 26 meq/L (ref 19–32)
CREATININE: 1.63 mg/dL — AB (ref 0.40–1.50)
GFR: 42.63 mL/min — ABNORMAL LOW (ref >60.00)
GLUCOSE: 95 mg/dL (ref 70–99)
Potassium: 4.1 mEq/L (ref 3.5–5.1)
Sodium: 142 mEq/L (ref 135–145)

## 2016-11-18 LAB — TSH: TSH: 3.58 u[IU]/mL (ref 0.35–4.50)

## 2016-11-18 MED ORDER — LEVOTHYROXINE SODIUM 50 MCG PO TABS: ORAL_TABLET | ORAL | 4 refills | Status: DC

## 2016-11-18 MED ORDER — SIMVASTATIN 40 MG PO TABS: ORAL_TABLET | ORAL | 4 refills | Status: DC

## 2016-11-18 MED ORDER — DONEPEZIL HCL 5 MG PO TABS: ORAL_TABLET | ORAL | 4 refills | Status: DC

## 2016-11-18 MED ORDER — LORAZEPAM 0.5 MG PO TABS: ORAL_TABLET | ORAL | 4 refills | Status: DC

## 2016-11-18 MED ORDER — ATENOLOL 50 MG PO TABS: 50.0000 mg | ORAL_TABLET | Freq: Every day | ORAL | 4 refills | Status: DC

## 2016-11-18 MED ORDER — TORSEMIDE 20 MG PO TABS: 20.0000 mg | ORAL_TABLET | Freq: Every day | ORAL | 4 refills | Status: DC

## 2016-11-18 NOTE — Progress Notes (Signed)
Pre visit review using our clinic review tool, if applicable. No additional management support is needed unless otherwise documented below in the visit note. 

## 2016-11-18 NOTE — Patient Instructions (Signed)
Continue current medications  Follow-up in one year sooner if any problems

## 2016-11-18 NOTE — Progress Notes (Signed)
Wayne [name] is an 83 year old married male nonsmoker who comes in today for general evaluation.  He has a history of hypertension. Is on Tenormin 50 mg and Demadex 20 mg daily. BP 120/80  He takes Aricept 5 mg nightly because of some slight memory loss  He takes Synthroid 50 g daily for hypothyroidism  Takes Zocor and aspirin daily for hyperlipidemia.  He takes Ativan 0.5 at bedtime for sleep  He self catheterizes himself because of BPH urine tract outlet obstruction. His urologist is Dr. Tommy Medal.  Vaccinations up-to-date  He does not get routine eye care. Recommend annual eye exam. He wants to see a new dentist recommended Dr. Gloriann Loan  Social he is married lives here in [place] because of his bilateral deformity and age etc. is basically housebound. He hasn't driven for urinary half.  14 point review of systems reviewed and otherwise negative  Cognitive function normal he does not exercise daily home health safety reviewed no issues identified, no guns in the house, he does have a healthcare power of attorney and living well  BP 92/62 (BP Location: Right Arm, Patient Position: Sitting, Cuff Size: Normal)   Temp 97.3 F (36.3 C) (Oral)   Ht 5\' 1"  (1.549 m)   Wt 159 lb 9.6 oz (72.4 kg)   BMI 30.16 kg/m  HEENT were negative except for bilateral cataracts neck was supple no adenopathy thyroid normal cardiopulmonary exam normal Dahms exam normal extremities 1-2+ edema. Usual  #1 hypertension at goal......... continue current therapy  #2 hyperlipidemia.... Continue current therapy  #3 slight memory loss.... Continue Aricept  #4 hypothyroidism.... Continue Synthroid  #5 BPH with outlet obstruction.... Continue self-catheterization follow-up by urologist  EKG was done because a history of hypertension and hyperlipidemia. EKG was unchanged

## 2017-05-26 ENCOUNTER — Other Ambulatory Visit: Payer: Self-pay | Admitting: Family Medicine

## 2017-05-26 NOTE — Telephone Encounter (Signed)
Rx will be due on 07/18/17

## 2017-05-28 NOTE — Telephone Encounter (Signed)
Rx was approved by Dr Sarajane Jews and was called in to pt pharmacy

## 2017-05-28 NOTE — Telephone Encounter (Signed)
This Rx has Expired written in 11/18/2016 pt is requesting for refills, please Advise

## 2017-05-28 NOTE — Telephone Encounter (Signed)
#  30 tablets with no refills was phoned in

## 2017-07-09 ENCOUNTER — Other Ambulatory Visit: Payer: Self-pay | Admitting: Family Medicine

## 2017-07-12 ENCOUNTER — Telehealth: Payer: Self-pay

## 2017-07-12 ENCOUNTER — Other Ambulatory Visit: Payer: Self-pay

## 2017-07-12 MED ORDER — LORAZEPAM 0.5 MG PO TABS: ORAL_TABLET | ORAL | 0 refills | Status: DC

## 2017-07-12 NOTE — Telephone Encounter (Signed)
Rx has been printed waiting for dr Sherren Mocha to sign

## 2017-07-12 NOTE — Telephone Encounter (Signed)
Pt Rx for Lorazepam was phoned in to his pharmacy

## 2017-07-15 NOTE — Telephone Encounter (Signed)
Rx was signed and call in to pt pharmacy for refill. Pt is aware.

## 2017-07-21 ENCOUNTER — Other Ambulatory Visit: Payer: Self-pay | Admitting: Family Medicine

## 2017-10-20 ENCOUNTER — Other Ambulatory Visit: Payer: Self-pay | Admitting: Family Medicine

## 2017-10-20 ENCOUNTER — Encounter: Payer: Self-pay | Admitting: Family Medicine

## 2017-10-20 ENCOUNTER — Ambulatory Visit (INDEPENDENT_AMBULATORY_CARE_PROVIDER_SITE_OTHER): Payer: Medicare Other | Admitting: Family Medicine

## 2017-10-20 VITALS — BP 130/88 | HR 64 | Temp 97.9°F | Wt 148.0 lb

## 2017-10-20 DIAGNOSIS — B9789 Other viral agents as the cause of diseases classified elsewhere: Secondary | ICD-10-CM

## 2017-10-20 DIAGNOSIS — J069 Acute upper respiratory infection, unspecified: Secondary | ICD-10-CM | POA: Diagnosis not present

## 2017-10-20 DIAGNOSIS — J181 Lobar pneumonia, unspecified organism: Secondary | ICD-10-CM

## 2017-10-20 DIAGNOSIS — E7800 Pure hypercholesterolemia, unspecified: Secondary | ICD-10-CM | POA: Diagnosis not present

## 2017-10-20 DIAGNOSIS — J189 Pneumonia, unspecified organism: Secondary | ICD-10-CM

## 2017-10-20 MED ORDER — PREDNISONE 20 MG PO TABS: ORAL_TABLET | ORAL | 1 refills | Status: DC

## 2017-10-20 MED ORDER — HYDROCODONE-HOMATROPINE 5-1.5 MG/5ML PO SYRP: 5.0000 mL | ORAL_SOLUTION | Freq: Three times a day (TID) | ORAL | 0 refills | Status: DC | PRN

## 2017-10-20 MED ORDER — AZITHROMYCIN 250 MG PO TABS: ORAL_TABLET | ORAL | 1 refills | Status: DC

## 2017-10-20 MED ORDER — SIMVASTATIN 40 MG PO TABS: ORAL_TABLET | ORAL | 4 refills | Status: DC

## 2017-10-20 MED ORDER — LEVOTHYROXINE SODIUM 50 MCG PO TABS: ORAL_TABLET | ORAL | 4 refills | Status: DC

## 2017-10-20 MED ORDER — LORAZEPAM 0.5 MG PO TABS: ORAL_TABLET | ORAL | 4 refills | Status: DC

## 2017-10-20 MED ORDER — DONEPEZIL HCL 5 MG PO TABS: ORAL_TABLET | ORAL | 4 refills | Status: DC

## 2017-10-20 MED ORDER — ATENOLOL 50 MG PO TABS: 50.0000 mg | ORAL_TABLET | Freq: Every day | ORAL | 4 refills | Status: DC

## 2017-10-20 MED ORDER — TORSEMIDE 20 MG PO TABS: 20.0000 mg | ORAL_TABLET | Freq: Every day | ORAL | 4 refills | Status: DC

## 2017-10-20 NOTE — Progress Notes (Signed)
Wayne [name] is an 84 year old married male nonsmoker comes in today with a 10 day history of cold  Says about 10 days ago he developed a sore throat no fever head congestion. Sore throat went away and now the cough has been going on for about a week. He's had no fever no sputum production. He does have severe scoliosis. He's had no history of pulmonary problems in the past.  He assistant otherwise negative  BP 130/88 (BP Location: Left Arm, Patient Position: Sitting, Cuff Size: Normal)   Pulse 64   Temp 97.9 F (36.6 C) (Oral)   Wt 148 lb (67.1 kg)   SpO2 96%   BMI 27.96 kg/m  He is a well-developed well-nourished male no acute distress vital signs stable he's afebrile pulse ox 96 on room air.  Examination the HEENT were negative neck was supple no adenopathy.  Pulmonary exam he has severe lumbar and thoracic scoliosis. He's been over almost 45 now and can't straighten up.  Breath sounds are symmetrical with crackles less based  #1 viral syndrome with secondary left lower lobe pneumonia......... see plan for treatment program

## 2017-10-20 NOTE — Patient Instructions (Signed)
Azithromycin.......Marland Kitchen 1 daily for 10 days  Rest at home  Drink lots of water  Prednisone 20 mg..... 2 tabs 3 days taper as outlined  Hydromet..........Marland Kitchen 1/2 teaspoon 3 times daily when necessary for cough  Return next Monday for follow-up

## 2017-10-25 ENCOUNTER — Ambulatory Visit: Payer: Medicare Other | Admitting: Family Medicine

## 2017-10-27 ENCOUNTER — Encounter: Payer: Self-pay | Admitting: Family Medicine

## 2017-10-27 ENCOUNTER — Ambulatory Visit (INDEPENDENT_AMBULATORY_CARE_PROVIDER_SITE_OTHER): Payer: Medicare Other | Admitting: Family Medicine

## 2017-10-27 VITALS — BP 124/82 | HR 50 | Temp 97.5°F | Wt 148.0 lb

## 2017-10-27 DIAGNOSIS — J181 Lobar pneumonia, unspecified organism: Secondary | ICD-10-CM | POA: Diagnosis not present

## 2017-10-27 DIAGNOSIS — J189 Pneumonia, unspecified organism: Secondary | ICD-10-CM

## 2017-10-27 NOTE — Progress Notes (Signed)
Wayne [name] is a 84 year old married male nonsmoker who comes in today for follow-up of pneumonia  Refer him last week. At that time he had a viral syndrome and his cough got worse. He was afebrile. Physical exam showed crackles left base consistent with a community-acquired pneumonia. He was given azithromycin 250 mg daily 7 days and because of the wheezing he was also started on prednisone 40 mg daily for 3 days and to taper. He's finished the antibiotic and is down to one prednisone a day and states he feels much better.  BP 124/82 (BP Location: Left Arm, Patient Position: Sitting, Cuff Size: Normal)   Pulse (!) 50   Temp (!) 97.5 F (36.4 C) (Oral)   Wt 148 lb (67.1 kg)   SpO2 96%   BMI 27.96 kg/m  Well-developed well-nourished male no acute distress in the sitting position.......Marland Kitchen because is difficult for him to get up on the examining table because of a spinal and leg disability ..... He has some crackles left base but the wheezing has resolved.  #1 viral syndrome with secondary community-acquired pneumonia improving ...........  Plan ...Marland KitchenMarland KitchenMarland Kitchen taper prednisone return when necessary

## 2017-10-27 NOTE — Patient Instructions (Signed)
Prednisone 20 mg............ starting tomorrow one half tablet daily until next Monday............ then starting next Monday one half tablet Monday Wednesday Friday for a two-week taper  Resume your normal activities  Wear those thigh-high stockings daily

## 2018-05-09 ENCOUNTER — Other Ambulatory Visit: Payer: Self-pay | Admitting: Family Medicine

## 2018-05-10 NOTE — Telephone Encounter (Signed)
Dr. Raliegh Ip, can you approve request in Dr. Honor Junes absence? Thanks!  Last OV: 10/27/17 Last filled: 10/25/17, #30, 0 RF Sig: TAKE 1 TABLET AT BEDTIME AS NEEDED

## 2018-05-27 DIAGNOSIS — I10 Essential (primary) hypertension: Secondary | ICD-10-CM | POA: Diagnosis not present

## 2018-05-27 DIAGNOSIS — Z9689 Presence of other specified functional implants: Secondary | ICD-10-CM | POA: Diagnosis not present

## 2018-05-27 DIAGNOSIS — M545 Low back pain: Secondary | ICD-10-CM | POA: Diagnosis not present

## 2018-06-01 ENCOUNTER — Other Ambulatory Visit: Payer: Self-pay | Admitting: Anesthesiology

## 2018-06-24 DIAGNOSIS — M961 Postlaminectomy syndrome, not elsewhere classified: Secondary | ICD-10-CM | POA: Diagnosis not present

## 2018-06-24 DIAGNOSIS — M545 Low back pain: Secondary | ICD-10-CM | POA: Diagnosis not present

## 2018-06-24 DIAGNOSIS — Z9689 Presence of other specified functional implants: Secondary | ICD-10-CM | POA: Diagnosis not present

## 2018-06-24 DIAGNOSIS — T85113A Breakdown (mechanical) of implanted electronic neurostimulator, generator, initial encounter: Secondary | ICD-10-CM | POA: Diagnosis not present

## 2018-07-01 ENCOUNTER — Ambulatory Visit: Admit: 2018-07-01 | Payer: Medicare Other | Admitting: Anesthesiology

## 2018-07-01 SURGERY — SPINAL CORD STIMULATOR BATTERY EXCHANGE
Anesthesia: General

## 2018-09-01 ENCOUNTER — Other Ambulatory Visit: Payer: Self-pay | Admitting: Internal Medicine

## 2018-09-09 ENCOUNTER — Telehealth: Payer: Self-pay | Admitting: Family Medicine

## 2018-09-09 NOTE — Telephone Encounter (Signed)
Copied from [place] [id number]. Topic: Quick Communication - Rx Refill/Question >> Sep 09, 2018 11:58 AM Blase Mess A wrote: Medication: LORazepam (ATIVAN) 0.5 MG tablet [[id number]] Transfer of Care appt is scheduled 10/18/18 with Dr. Jerilee Hoh   Has the patient contacted their pharmacy? yes (Agent: If no, request that the patient contact the pharmacy for the refill.) (Agent: If yes, when and what did the pharmacy advise?)  Preferred Pharmacy (with phone number or street name): [id number] - [place], [place]. AT [address] [place] [address] Phone: [phone] Fax: [phone]    Agent: Please be advised that RX refills may take up to 3 business days. We ask that you follow-up with your pharmacy.

## 2018-09-12 ENCOUNTER — Other Ambulatory Visit: Payer: Self-pay | Admitting: Internal Medicine

## 2018-09-12 DIAGNOSIS — F5101 Primary insomnia: Secondary | ICD-10-CM

## 2018-09-12 MED ORDER — LORAZEPAM 0.5 MG PO TABS: ORAL_TABLET | ORAL | 1 refills | Status: DC

## 2018-09-12 NOTE — Telephone Encounter (Signed)
Okay to fill until visit?

## 2018-09-12 NOTE — Telephone Encounter (Signed)
Already sent.

## 2018-09-12 NOTE — Telephone Encounter (Signed)
Okay for refill? Please advise 

## 2018-09-12 NOTE — Telephone Encounter (Signed)
Refilled for 1 month. No further until seen by me.

## 2018-10-11 ENCOUNTER — Inpatient Hospital Stay (HOSPITAL_COMMUNITY): Payer: Medicare Other

## 2018-10-11 ENCOUNTER — Inpatient Hospital Stay (HOSPITAL_COMMUNITY)
Admission: AD | Admit: 2018-10-11 | Discharge: 2018-10-15 | DRG: 481 | Disposition: A | Discharge status: Skilled Nursing Facility | Payer: Medicare Other | Source: Ambulatory Visit | Attending: Internal Medicine | Admitting: Internal Medicine

## 2018-10-11 ENCOUNTER — Other Ambulatory Visit: Payer: Self-pay

## 2018-10-11 DIAGNOSIS — M25552 Pain in left hip: Secondary | ICD-10-CM | POA: Diagnosis not present

## 2018-10-11 DIAGNOSIS — Z9181 History of falling: Secondary | ICD-10-CM | POA: Diagnosis not present

## 2018-10-11 DIAGNOSIS — Z79899 Other long term (current) drug therapy: Secondary | ICD-10-CM

## 2018-10-11 DIAGNOSIS — F419 Anxiety disorder, unspecified: Secondary | ICD-10-CM | POA: Diagnosis present

## 2018-10-11 DIAGNOSIS — S72002A Fracture of unspecified part of neck of left femur, initial encounter for closed fracture: Secondary | ICD-10-CM | POA: Diagnosis not present

## 2018-10-11 DIAGNOSIS — N400 Enlarged prostate without lower urinary tract symptoms: Secondary | ICD-10-CM | POA: Diagnosis present

## 2018-10-11 DIAGNOSIS — E039 Hypothyroidism, unspecified: Secondary | ICD-10-CM | POA: Diagnosis present

## 2018-10-11 DIAGNOSIS — E440 Moderate protein-calorie malnutrition: Secondary | ICD-10-CM | POA: Diagnosis not present

## 2018-10-11 DIAGNOSIS — R2689 Other abnormalities of gait and mobility: Secondary | ICD-10-CM | POA: Diagnosis not present

## 2018-10-11 DIAGNOSIS — N184 Chronic kidney disease, stage 4 (severe): Secondary | ICD-10-CM | POA: Diagnosis not present

## 2018-10-11 DIAGNOSIS — I517 Cardiomegaly: Secondary | ICD-10-CM | POA: Diagnosis not present

## 2018-10-11 DIAGNOSIS — I872 Venous insufficiency (chronic) (peripheral): Secondary | ICD-10-CM | POA: Diagnosis not present

## 2018-10-11 DIAGNOSIS — Z9682 Presence of neurostimulator: Secondary | ICD-10-CM

## 2018-10-11 DIAGNOSIS — G8929 Other chronic pain: Secondary | ICD-10-CM | POA: Diagnosis not present

## 2018-10-11 DIAGNOSIS — Z823 Family history of stroke: Secondary | ICD-10-CM | POA: Diagnosis not present

## 2018-10-11 DIAGNOSIS — S72142A Displaced intertrochanteric fracture of left femur, initial encounter for closed fracture: Secondary | ICD-10-CM | POA: Diagnosis not present

## 2018-10-11 DIAGNOSIS — J069 Acute upper respiratory infection, unspecified: Secondary | ICD-10-CM | POA: Diagnosis not present

## 2018-10-11 DIAGNOSIS — K219 Gastro-esophageal reflux disease without esophagitis: Secondary | ICD-10-CM | POA: Diagnosis present

## 2018-10-11 DIAGNOSIS — M6281 Muscle weakness (generalized): Secondary | ICD-10-CM | POA: Diagnosis not present

## 2018-10-11 DIAGNOSIS — R05 Cough: Secondary | ICD-10-CM | POA: Diagnosis not present

## 2018-10-11 DIAGNOSIS — W010XXA Fall on same level from slipping, tripping and stumbling without subsequent striking against object, initial encounter: Secondary | ICD-10-CM | POA: Diagnosis present

## 2018-10-11 DIAGNOSIS — I10 Essential (primary) hypertension: Secondary | ICD-10-CM | POA: Diagnosis not present

## 2018-10-11 DIAGNOSIS — Z79890 Hormone replacement therapy (postmenopausal): Secondary | ICD-10-CM

## 2018-10-11 DIAGNOSIS — M2550 Pain in unspecified joint: Secondary | ICD-10-CM | POA: Diagnosis not present

## 2018-10-11 DIAGNOSIS — I129 Hypertensive chronic kidney disease with stage 1 through stage 4 chronic kidney disease, or unspecified chronic kidney disease: Secondary | ICD-10-CM | POA: Diagnosis present

## 2018-10-11 DIAGNOSIS — S72145S Nondisplaced intertrochanteric fracture of left femur, sequela: Secondary | ICD-10-CM | POA: Diagnosis not present

## 2018-10-11 DIAGNOSIS — E7800 Pure hypercholesterolemia, unspecified: Secondary | ICD-10-CM | POA: Diagnosis not present

## 2018-10-11 DIAGNOSIS — Z419 Encounter for procedure for purposes other than remedying health state, unspecified: Secondary | ICD-10-CM

## 2018-10-11 DIAGNOSIS — Z8614 Personal history of Methicillin resistant Staphylococcus aureus infection: Secondary | ICD-10-CM

## 2018-10-11 DIAGNOSIS — S72009A Fracture of unspecified part of neck of unspecified femur, initial encounter for closed fracture: Secondary | ICD-10-CM

## 2018-10-11 DIAGNOSIS — S72142G Displaced intertrochanteric fracture of left femur, subsequent encounter for closed fracture with delayed healing: Secondary | ICD-10-CM | POA: Diagnosis not present

## 2018-10-11 DIAGNOSIS — M545 Low back pain: Secondary | ICD-10-CM | POA: Diagnosis not present

## 2018-10-11 DIAGNOSIS — Z7982 Long term (current) use of aspirin: Secondary | ICD-10-CM | POA: Diagnosis not present

## 2018-10-11 DIAGNOSIS — Z880 Allergy status to penicillin: Secondary | ICD-10-CM

## 2018-10-11 DIAGNOSIS — Z111 Encounter for screening for respiratory tuberculosis: Secondary | ICD-10-CM | POA: Diagnosis not present

## 2018-10-11 DIAGNOSIS — Z888 Allergy status to other drugs, medicaments and biological substances status: Secondary | ICD-10-CM | POA: Diagnosis not present

## 2018-10-11 DIAGNOSIS — F0390 Unspecified dementia without behavioral disturbance: Secondary | ICD-10-CM | POA: Diagnosis not present

## 2018-10-11 DIAGNOSIS — Z8249 Family history of ischemic heart disease and other diseases of the circulatory system: Secondary | ICD-10-CM | POA: Diagnosis not present

## 2018-10-11 DIAGNOSIS — Z23 Encounter for immunization: Secondary | ICD-10-CM | POA: Diagnosis not present

## 2018-10-11 DIAGNOSIS — E785 Hyperlipidemia, unspecified: Secondary | ICD-10-CM | POA: Diagnosis present

## 2018-10-11 DIAGNOSIS — S72145D Nondisplaced intertrochanteric fracture of left femur, subsequent encounter for closed fracture with routine healing: Secondary | ICD-10-CM | POA: Diagnosis not present

## 2018-10-11 DIAGNOSIS — R600 Localized edema: Secondary | ICD-10-CM | POA: Diagnosis present

## 2018-10-11 DIAGNOSIS — Y92019 Unspecified place in single-family (private) house as the place of occurrence of the external cause: Secondary | ICD-10-CM | POA: Diagnosis not present

## 2018-10-11 DIAGNOSIS — R413 Other amnesia: Secondary | ICD-10-CM | POA: Diagnosis present

## 2018-10-11 DIAGNOSIS — Z833 Family history of diabetes mellitus: Secondary | ICD-10-CM | POA: Diagnosis not present

## 2018-10-11 DIAGNOSIS — K5900 Constipation, unspecified: Secondary | ICD-10-CM | POA: Diagnosis not present

## 2018-10-11 DIAGNOSIS — Z7401 Bed confinement status: Secondary | ICD-10-CM | POA: Diagnosis not present

## 2018-10-11 DIAGNOSIS — M542 Cervicalgia: Secondary | ICD-10-CM | POA: Diagnosis not present

## 2018-10-11 DIAGNOSIS — R0602 Shortness of breath: Secondary | ICD-10-CM

## 2018-10-11 LAB — TYPE AND SCREEN
ABO/RH(D): A POS
Antibody Screen: NEGATIVE

## 2018-10-11 LAB — BASIC METABOLIC PANEL
Anion gap: 14 (ref 5–15)
BUN: 45 mg/dL — ABNORMAL HIGH (ref 8–23)
CHLORIDE: 109 mmol/L (ref 98–111)
CO2: 20 mmol/L — ABNORMAL LOW (ref 22–32)
Calcium: 9.5 mg/dL (ref 8.9–10.3)
Creatinine, Ser: 2.11 mg/dL — ABNORMAL HIGH (ref 0.61–1.24)
GFR calc Af Amer: 31 mL/min — ABNORMAL LOW (ref >60)
GFR calc non Af Amer: 27 mL/min — ABNORMAL LOW (ref >60)
Glucose, Bld: 130 mg/dL — ABNORMAL HIGH (ref 70–99)
Potassium: 4.9 mmol/L (ref 3.5–5.1)
Sodium: 143 mmol/L (ref 135–145)

## 2018-10-11 LAB — CBC
HCT: 44.6 % (ref 39.0–52.0)
Hemoglobin: 14.4 g/dL (ref 13.0–17.0)
MCH: 30 pg (ref 26.0–34.0)
MCHC: 32.3 g/dL (ref 30.0–36.0)
MCV: 92.9 fL (ref 80.0–100.0)
Platelets: 352 10*3/uL (ref 150–400)
RBC: 4.8 MIL/uL (ref 4.22–5.81)
RDW: 13.5 % (ref 11.5–15.5)
WBC: 12.8 10*3/uL — ABNORMAL HIGH (ref 4.0–10.5)
nRBC: 0 % (ref 0.0–0.2)

## 2018-10-11 LAB — PROTIME-INR
INR: 1.08
Prothrombin Time: 13.9 seconds (ref 11.4–15.2)

## 2018-10-11 LAB — ABO/RH: ABO/RH(D): A POS

## 2018-10-11 MED ORDER — SENNOSIDES-DOCUSATE SODIUM 8.6-50 MG PO TABS: 1.0000 | ORAL_TABLET | Freq: Every evening | ORAL | Status: DC | PRN

## 2018-10-11 MED ORDER — ATENOLOL 50 MG PO TABS
50.0000 mg | ORAL_TABLET | Freq: Every day | ORAL | Status: DC
  Administered 2018-10-13 – 2018-10-15 (×3): 50 mg via ORAL
  Filled 2018-10-11 (×3): qty 1

## 2018-10-11 MED ORDER — METOPROLOL TARTRATE 5 MG/5ML IV SOLN: 5.0000 mg | Freq: Four times a day (QID) | INTRAVENOUS | Status: DC | PRN

## 2018-10-11 MED ORDER — SIMVASTATIN 20 MG PO TABS
40.0000 mg | ORAL_TABLET | Freq: Every day | ORAL | Status: DC
  Administered 2018-10-12 – 2018-10-14 (×3): 40 mg via ORAL
  Filled 2018-10-11 (×3): qty 2

## 2018-10-11 MED ORDER — LORAZEPAM 0.5 MG PO TABS
0.5000 mg | ORAL_TABLET | Freq: Every day | ORAL | Status: DC
  Administered 2018-10-11 – 2018-10-14 (×4): 0.5 mg via ORAL
  Filled 2018-10-11 (×4): qty 1

## 2018-10-11 MED ORDER — ACETAMINOPHEN 325 MG PO TABS: 650.0000 mg | ORAL_TABLET | Freq: Four times a day (QID) | ORAL | Status: DC | PRN

## 2018-10-11 MED ORDER — HYDRALAZINE HCL 20 MG/ML IJ SOLN: 10.0000 mg | Freq: Four times a day (QID) | INTRAMUSCULAR | Status: DC | PRN

## 2018-10-11 MED ORDER — LEVOTHYROXINE SODIUM 50 MCG PO TABS
50.0000 ug | ORAL_TABLET | Freq: Every day | ORAL | Status: DC
  Administered 2018-10-12 – 2018-10-15 (×4): 50 ug via ORAL
  Filled 2018-10-11 (×4): qty 1

## 2018-10-11 MED ORDER — ACETAMINOPHEN 650 MG RE SUPP: 650.0000 mg | Freq: Four times a day (QID) | RECTAL | Status: DC | PRN

## 2018-10-11 MED ORDER — HYDROCODONE-ACETAMINOPHEN 5-325 MG PO TABS
1.0000 | ORAL_TABLET | ORAL | Status: DC | PRN
  Administered 2018-10-11: 2 via ORAL
  Administered 2018-10-12: 1 via ORAL
  Administered 2018-10-12: 2 via ORAL
  Administered 2018-10-12: 1 via ORAL
  Administered 2018-10-12: 2 via ORAL
  Administered 2018-10-13: 1 via ORAL
  Administered 2018-10-13: 2 via ORAL
  Administered 2018-10-13 (×2): 1 via ORAL
  Administered 2018-10-14: 2 via ORAL
  Administered 2018-10-14: 1 via ORAL
  Filled 2018-10-11 (×3): qty 1
  Filled 2018-10-11 (×5): qty 2
  Filled 2018-10-11: qty 1
  Filled 2018-10-11 (×2): qty 2

## 2018-10-11 MED ORDER — TORSEMIDE 20 MG PO TABS
20.0000 mg | ORAL_TABLET | Freq: Every day | ORAL | Status: DC
  Administered 2018-10-13 – 2018-10-15 (×3): 20 mg via ORAL
  Filled 2018-10-11 (×3): qty 1

## 2018-10-11 MED ORDER — INFLUENZA VAC SPLIT HIGH-DOSE 0.5 ML IM SUSY
0.5000 mL | PREFILLED_SYRINGE | INTRAMUSCULAR | Status: DC
  Filled 2018-10-11: qty 0.5

## 2018-10-11 MED ORDER — LACTATED RINGERS IV SOLN
INTRAVENOUS | Status: AC
  Administered 2018-10-12: 06:00:00 via INTRAVENOUS

## 2018-10-11 MED ORDER — HEPARIN SODIUM (PORCINE) 5000 UNIT/ML IJ SOLN
5000.0000 [IU] | Freq: Three times a day (TID) | INTRAMUSCULAR | Status: DC
  Filled 2018-10-11 (×2): qty 1

## 2018-10-11 MED ORDER — HALOPERIDOL LACTATE 5 MG/ML IJ SOLN: 2.0000 mg | Freq: Four times a day (QID) | INTRAMUSCULAR | Status: DC | PRN

## 2018-10-11 MED ORDER — ASPIRIN EC 81 MG PO TBEC
81.0000 mg | DELAYED_RELEASE_TABLET | Freq: Every day | ORAL | Status: DC
  Administered 2018-10-13 – 2018-10-15 (×3): 81 mg via ORAL
  Filled 2018-10-11 (×3): qty 1

## 2018-10-11 NOTE — H&P (View-Only) (Signed)
ORTHOPAEDIC CONSULTATION  REQUESTING PHYSICIAN: Thurnell Lose, MD  Chief Complaint: Left hip pain, fall  HPI: Wayne [name] is a 84 y.o. male who complains of left hip pain after fall about 10 days ago.  He had progressing difficulty ambulating and worsening pain.  He presented to see Dr. French Ana at [facility] orthopedics where x-rays showed a left intertrochanteric femur fracture.  He was admitted to the hospital and Dr. Percell Miller was consulted for surgical consultation.  Wound care was consulted for sacral wound.  This evening, the patient's pain is controlled.  He denies history of MI or CVA.  No history of DVT/PE.  Never smoker.  No alcohol use.  He has had multiple back surgeries and peripheral neuropathy as well as chronic pitting edema in both feet.  He was previously ambulatory with the aid of a cane living with his wife who reportedly has relatively recently diagnosed dementia.       Past Medical History:  Diagnosis Date  . Anxiety    takes Atrivan daily  . Back pain    chronic with neck pain  . Cancer ([place])    skin  . CHF (congestive heart failure) ([place])   . Constipation    related to medication  . Dementia    takes Aricept nightly  . Enlarged prostate    self caths once every 1-2wks  . GERD (gastroesophageal reflux disease)    doesn't require medication  . Hx of seasonal allergies   . Hyperlipidemia    takes Simvastatin daily  . Hypertension    takes atenolol daily  . Hypothyroidism    takes Synthroid daily  . Insomnia    related to pain  . MRSA (methicillin resistant staph aureus) culture positive    Per patient tested on 10/07/11.  . Osteoporosis   . Peripheral edema    takes Torsemide daily  . Scoliosis    Past Surgical History:  Procedure Laterality Date  . abdominal cyst     removed-2012;MRSA done by Dr.Gross  . CATARACT EXTRACTION    . cataracts     bilateral  . CERVICAL DISC SURGERY     x1  . COLONOSCOPY    .  ESOPHAGOGASTRODUODENOSCOPY    . HERNIA REPAIR     Right inguinal  . LUMBAR DISC SURGERY     x2  . TOE SURGERY     right great toe  . TRANSURETHRAL RESECTION OF PROSTATE     15+yrs ago   Social History   Socioeconomic History  . Marital status: Married    Spouse name: Not on file  . Number of children: 4  . Years of education: Not on file  . Highest education level: Not on file  Occupational History  . Not on file  Social Needs  . Financial resource strain: Not on file  . Food insecurity:    Worry: Not on file    Inability: Not on file  . Transportation needs:    Medical: Not on file    Non-medical: Not on file  Tobacco Use  . Smoking status: Never Smoker  . Smokeless tobacco: Never Used  Substance and Sexual Activity  . Alcohol use: No  . Drug use: No  . Sexual activity: Yes  Lifestyle  . Physical activity:    Days per week: Not on file    Minutes per session: Not on file  . Stress: Not on file  Relationships  . Social connections:  Talks on phone: Not on file    Gets together: Not on file    Attends religious service: Not on file    Active member of club or organization: Not on file    Attends meetings of clubs or organizations: Not on file    Relationship status: Not on file  Other Topics Concern  . Not on file  Social History Narrative   Lives at home with wife.   Family History  Problem Relation Age of Onset  . Heart disease Mother 28       Vague history  . Stroke Father   . Hypertension Other        Multiple family members both sides  . Diabetes Other   . Anesthesia problems Neg Hx   . Hypotension Neg Hx   . Malignant hyperthermia Neg Hx   . Pseudochol deficiency Neg Hx    Allergies  Allergen Reactions  . Doxazosin Mesylate Other (See Comments)    Makes blood pressure  . Propoxyphene Hcl Other (See Comments)    REACTION: upset stomach  . Penicillins Rash    DID THE REACTION INVOLVE: Swelling of the face/tongue/throat, SOB, or low BP?  No Sudden or severe rash/hives, skin peeling, or the inside of the mouth or nose? Yes Did it require medical treatment? Yes When did it last happen?50 yrs ago If all above answers are "NO", may proceed with cephalosporin use.    Prior to Admission medications   Medication Sig Start Date End Date Taking? Authorizing Provider  aspirin EC 81 MG tablet Take 81 mg by mouth daily.   Yes [provider]  atenolol (TENORMIN) 50 MG tablet Take 1 tablet (50 mg total) by mouth daily. 10/20/17  Yes Dorena Cookey, MD  donepezil (ARICEPT) 5 MG tablet TAKE 1 TABLET (5 MG TOTAL) BY MOUTH AT BEDTIME. Patient taking differently: Take 5 mg by mouth at bedtime.  10/20/17  Yes Dorena Cookey, MD  levothyroxine (SYNTHROID, LEVOTHROID) 50 MCG tablet TAKE 1 TABLET BY MOUTH ONCE DAILY *PT NEEDS OFFICE VISIT* Patient taking differently: Take 50 mcg by mouth daily before breakfast.  10/20/17  Yes Dorena Cookey, MD  LORazepam (ATIVAN) 0.5 MG tablet TAKE 1 TABLET AT BEDTIME WHEN NECESSARY Patient taking differently: Take 0.5 mg by mouth at bedtime as needed for sleep.  09/12/18  Yes Isaac Bliss, Rayford Halsted, MD  simvastatin (ZOCOR) 40 MG tablet TAKE 1 TABLET BY MOUTH EVERY NIGHT Patient taking differently: Take 40 mg by mouth daily at 6 PM.  10/20/17  Yes Dorena Cookey, MD  torsemide (DEMADEX) 20 MG tablet Take 1 tablet (20 mg total) by mouth daily. 10/20/17  Yes Dorena Cookey, MD   Dg Chest Port 1 View  Result Date: 10/11/2018 CLINICAL DATA:  Preop for surgery to repair fractured hip EXAM: PORTABLE CHEST 1 VIEW COMPARISON:  Chest x-ray of 02/15/2012 FINDINGS: No active infiltrate or effusion is seen. Skin folds overlie both lung fields. Mediastinal and hilar contours are unremarkable and mild cardiomegaly is stable. Ectasia of the descending thoracic aorta appears stable. Neurostimulator electrode leads overlie the T7-8 level. A lower anterior cervical spine fusion plate is present. IMPRESSION: No active  lung disease.  Stable mild cardiomegaly. Electronically Signed   By: Ivar Drape M.D.   On: 10/11/2018 15:07    Positive ROS: All other systems have been reviewed and were otherwise negative with the exception of those mentioned in the HPI and as above.  Objective: Labs cbc  Recent Labs    10/11/18 1503  WBC 12.8*  HGB 14.4  HCT 44.6  PLT 352    Labs inflam No results for input(s): CRP in the last 72 hours.  Invalid input(s): ESR  Labs coag Recent Labs    10/11/18 1503  INR 1.08    Recent Labs    10/11/18 1503  NA 143  K 4.9  CL 109  CO2 20*  GLUCOSE 130*  BUN 45*  CREATININE 2.11*  CALCIUM 9.5    Physical Exam: Vitals:   10/11/18 1428  BP: (!) 141/97  Pulse: 95  Resp: 16  Temp: (!) 97.5 F (36.4 C)  SpO2: 97%   General: Alert, no acute distress.  Upright in bed on arrival.  Calm, conversant.  Family at bedside Mental status: Alert and Oriented x3 Neurologic: Speech Clear and organized, no gross focal findings or movement disorder appreciated. Respiratory: No cyanosis, no use of accessory musculature Cardiovascular: RRR GI: Abdomen is soft and non-tender, non-distended. Skin: Warm and dry.  No lesions in the area of chief complaint Extremities: Warm.  3+ pitting edema in feet. Psychiatric: Patient is competent for consent with normal mood and affect  MUSCULOSKELETAL:  LLE: Left hip pain with range of motion.  Some tenderness laterally over the greater trochanter.  Moderate bruising medial thigh.  Sensation grossly intact distally and at baseline per patient.  Dorsiflexion, plantarflexion, EHL FHL intact.   Assessment / Plan: Principal Problem:   Closed intertrochanteric fracture of left femur (HCC) Active Problems:   Hypothyroidism   Hyperlipidemia   Short-term memory loss   Essential hypertension   Venous insufficiency   Hip fracture, unspecified laterality, closed, initial encounter ([place])   Hip fracture (HCC)   Closed left  intertrochanteric femur fracture  Plan for Operative fixation tomorrow, 10/12/2018 -NPO  -Medicine team to admit and perform pre-op clearance -PT/OT post op  The risks benefits and alternatives of the procedure were discussed with the patient including but not limited to infection, bleeding, nerve injury, the need for revision surgery, blood clots, cardiopulmonary complications, morbidity, mortality, among others.  The patient verbalizes understanding and wishes to proceed.    Weightbearing: Bedrest.  Will amend post op. VTE prophylaxis: [place] heparin started.  Likely Lovenox postoperatively.. SCDs.  Pain control:  Minimize narcotics if able. Follow-up plan: Will follow in acute inpatient post-op phase.  Plan for outpatient follow up with Dr. Alain Marion in about 2 weeks. Contact information:  Edmonia Lynch MD, Roxan Hockey PA-C  Prudencio Burly III PA-C 10/11/2018 7:11 PM

## 2018-10-11 NOTE — Progress Notes (Signed)
   10/11/18 2200  Clinical Encounter Type  Visited With Patient and family together;Health care provider  Visit Type Initial;Other (Comment) (AD request)  Referral From Nurse  Consult/Referral To Other (Comment) (AC on duty for notary)  Spiritual Encounters  Spiritual Needs Literature;Emotional  Stress Factors  Patient Stress Factors Exhausted;Health changes   Responded to Epic consult request for AD.  Met w/ pt with family present w/ his ok.  Provided AD paperwork and talked through some of it.  Pt has procedure tomorrow and d/t holiday, typical Spiritual Care dept notary not available tomorrow.  Also, pt had two visitors who are not family who could serve as witnesses.  Went over filled out paperwork w/ pt to confirm these are his desires, called [facility] who affirmed that she could do it this evening and let Charge RN and pt and family know that [facility] would come to room shortly to notarize forms.  Also provided support to some of the family in the anteroom; another family member is also hospitalized at this time.  Myra Gianotti resident, [phone]

## 2018-10-11 NOTE — Consult Note (Addendum)
ORTHOPAEDIC CONSULTATION  REQUESTING PHYSICIAN: Thurnell Lose, MD  Chief Complaint: Left hip pain, fall  HPI: Wayne [name] is a 84 y.o. male who complains of left hip pain after fall about 10 days ago.  He had progressing difficulty ambulating and worsening pain.  He presented to see Dr. French Ana at [facility] orthopedics where x-rays showed a left intertrochanteric femur fracture.  He was admitted to the hospital and Dr. Percell Miller was consulted for surgical consultation.  Wound care was consulted for sacral wound.  This evening, the patient's pain is controlled.  He denies history of MI or CVA.  No history of DVT/PE.  Never smoker.  No alcohol use.  He has had multiple back surgeries and peripheral neuropathy as well as chronic pitting edema in both feet.  He was previously ambulatory with the aid of a cane living with his wife who reportedly has relatively recently diagnosed dementia.       Past Medical History:  Diagnosis Date  . Anxiety    takes Atrivan daily  . Back pain    chronic with neck pain  . Cancer ([place])    skin  . CHF (congestive heart failure) ([place])   . Constipation    related to medication  . Dementia    takes Aricept nightly  . Enlarged prostate    self caths once every 1-2wks  . GERD (gastroesophageal reflux disease)    doesn't require medication  . Hx of seasonal allergies   . Hyperlipidemia    takes Simvastatin daily  . Hypertension    takes atenolol daily  . Hypothyroidism    takes Synthroid daily  . Insomnia    related to pain  . MRSA (methicillin resistant staph aureus) culture positive    Per patient tested on 10/07/11.  . Osteoporosis   . Peripheral edema    takes Torsemide daily  . Scoliosis    Past Surgical History:  Procedure Laterality Date  . abdominal cyst     removed-2012;MRSA done by Dr.Gross  . CATARACT EXTRACTION    . cataracts     bilateral  . CERVICAL DISC SURGERY     x1  . COLONOSCOPY    .  ESOPHAGOGASTRODUODENOSCOPY    . HERNIA REPAIR     Right inguinal  . LUMBAR DISC SURGERY     x2  . TOE SURGERY     right great toe  . TRANSURETHRAL RESECTION OF PROSTATE     15+yrs ago   Social History   Socioeconomic History  . Marital status: Married    Spouse name: Not on file  . Number of children: 4  . Years of education: Not on file  . Highest education level: Not on file  Occupational History  . Not on file  Social Needs  . Financial resource strain: Not on file  . Food insecurity:    Worry: Not on file    Inability: Not on file  . Transportation needs:    Medical: Not on file    Non-medical: Not on file  Tobacco Use  . Smoking status: Never Smoker  . Smokeless tobacco: Never Used  Substance and Sexual Activity  . Alcohol use: No  . Drug use: No  . Sexual activity: Yes  Lifestyle  . Physical activity:    Days per week: Not on file    Minutes per session: Not on file  . Stress: Not on file  Relationships  . Social connections:  Talks on phone: Not on file    Gets together: Not on file    Attends religious service: Not on file    Active member of club or organization: Not on file    Attends meetings of clubs or organizations: Not on file    Relationship status: Not on file  Other Topics Concern  . Not on file  Social History Narrative   Lives at home with wife.   Family History  Problem Relation Age of Onset  . Heart disease Mother 28       Vague history  . Stroke Father   . Hypertension Other        Multiple family members both sides  . Diabetes Other   . Anesthesia problems Neg Hx   . Hypotension Neg Hx   . Malignant hyperthermia Neg Hx   . Pseudochol deficiency Neg Hx    Allergies  Allergen Reactions  . Doxazosin Mesylate Other (See Comments)    Makes blood pressure  . Propoxyphene Hcl Other (See Comments)    REACTION: upset stomach  . Penicillins Rash    DID THE REACTION INVOLVE: Swelling of the face/tongue/throat, SOB, or low BP?  No Sudden or severe rash/hives, skin peeling, or the inside of the mouth or nose? Yes Did it require medical treatment? Yes When did it last happen?50 yrs ago If all above answers are "NO", may proceed with cephalosporin use.    Prior to Admission medications   Medication Sig Start Date End Date Taking? Authorizing Provider  aspirin EC 81 MG tablet Take 81 mg by mouth daily.   Yes [provider]  atenolol (TENORMIN) 50 MG tablet Take 1 tablet (50 mg total) by mouth daily. 10/20/17  Yes Dorena Cookey, MD  donepezil (ARICEPT) 5 MG tablet TAKE 1 TABLET (5 MG TOTAL) BY MOUTH AT BEDTIME. Patient taking differently: Take 5 mg by mouth at bedtime.  10/20/17  Yes Dorena Cookey, MD  levothyroxine (SYNTHROID, LEVOTHROID) 50 MCG tablet TAKE 1 TABLET BY MOUTH ONCE DAILY *PT NEEDS OFFICE VISIT* Patient taking differently: Take 50 mcg by mouth daily before breakfast.  10/20/17  Yes Dorena Cookey, MD  LORazepam (ATIVAN) 0.5 MG tablet TAKE 1 TABLET AT BEDTIME WHEN NECESSARY Patient taking differently: Take 0.5 mg by mouth at bedtime as needed for sleep.  09/12/18  Yes Isaac Bliss, Rayford Halsted, MD  simvastatin (ZOCOR) 40 MG tablet TAKE 1 TABLET BY MOUTH EVERY NIGHT Patient taking differently: Take 40 mg by mouth daily at 6 PM.  10/20/17  Yes Dorena Cookey, MD  torsemide (DEMADEX) 20 MG tablet Take 1 tablet (20 mg total) by mouth daily. 10/20/17  Yes Dorena Cookey, MD   Dg Chest Port 1 View  Result Date: 10/11/2018 CLINICAL DATA:  Preop for surgery to repair fractured hip EXAM: PORTABLE CHEST 1 VIEW COMPARISON:  Chest x-ray of 02/15/2012 FINDINGS: No active infiltrate or effusion is seen. Skin folds overlie both lung fields. Mediastinal and hilar contours are unremarkable and mild cardiomegaly is stable. Ectasia of the descending thoracic aorta appears stable. Neurostimulator electrode leads overlie the T7-8 level. A lower anterior cervical spine fusion plate is present. IMPRESSION: No active  lung disease.  Stable mild cardiomegaly. Electronically Signed   By: Ivar Drape M.D.   On: 10/11/2018 15:07    Positive ROS: All other systems have been reviewed and were otherwise negative with the exception of those mentioned in the HPI and as above.  Objective: Labs cbc  Recent Labs    10/11/18 1503  WBC 12.8*  HGB 14.4  HCT 44.6  PLT 352    Labs inflam No results for input(s): CRP in the last 72 hours.  Invalid input(s): ESR  Labs coag Recent Labs    10/11/18 1503  INR 1.08    Recent Labs    10/11/18 1503  NA 143  K 4.9  CL 109  CO2 20*  GLUCOSE 130*  BUN 45*  CREATININE 2.11*  CALCIUM 9.5    Physical Exam: Vitals:   10/11/18 1428  BP: (!) 141/97  Pulse: 95  Resp: 16  Temp: (!) 97.5 F (36.4 C)  SpO2: 97%   General: Alert, no acute distress.  Upright in bed on arrival.  Calm, conversant.  Family at bedside Mental status: Alert and Oriented x3 Neurologic: Speech Clear and organized, no gross focal findings or movement disorder appreciated. Respiratory: No cyanosis, no use of accessory musculature Cardiovascular: RRR GI: Abdomen is soft and non-tender, non-distended. Skin: Warm and dry.  No lesions in the area of chief complaint Extremities: Warm.  3+ pitting edema in feet. Psychiatric: Patient is competent for consent with normal mood and affect  MUSCULOSKELETAL:  LLE: Left hip pain with range of motion.  Some tenderness laterally over the greater trochanter.  Moderate bruising medial thigh.  Sensation grossly intact distally and at baseline per patient.  Dorsiflexion, plantarflexion, EHL FHL intact.   Assessment / Plan: Principal Problem:   Closed intertrochanteric fracture of left femur (HCC) Active Problems:   Hypothyroidism   Hyperlipidemia   Short-term memory loss   Essential hypertension   Venous insufficiency   Hip fracture, unspecified laterality, closed, initial encounter ([place])   Hip fracture (HCC)   Closed left  intertrochanteric femur fracture  Plan for Operative fixation tomorrow, 10/12/2018 -NPO  -Medicine team to admit and perform pre-op clearance -PT/OT post op  The risks benefits and alternatives of the procedure were discussed with the patient including but not limited to infection, bleeding, nerve injury, the need for revision surgery, blood clots, cardiopulmonary complications, morbidity, mortality, among others.  The patient verbalizes understanding and wishes to proceed.    Weightbearing: Bedrest.  Will amend post op. VTE prophylaxis: [place] heparin started.  Likely Lovenox postoperatively.. SCDs.  Pain control:  Minimize narcotics if able. Follow-up plan: Will follow in acute inpatient post-op phase.  Plan for outpatient follow up with Dr. Alain Marion in about 2 weeks. Contact information:  Edmonia Lynch MD, Roxan Hockey PA-C  Prudencio Burly III PA-C 10/11/2018 7:11 PM

## 2018-10-11 NOTE — H&P (Signed)
TRH H&P   Patient Demographics:    Wayne [name], is a 84 y.o. male  MRN: [id number]   DOB - October 24, 1925  Admit Date - 10/11/2018  Outpatient Primary MD for the patient is Isaac Bliss, Rayford Halsted, MD    Patient coming from: Home  CC - fall with L.Hip pain    HPI:    Wayne [name]  is a 84 y.o. male, with history of hypertension, dyslipidemia, BPH self caths himself intermittently once or twice a day as needed, chronic urethral scar, early memory loss, chronic back pain status post spinal stimulator placement by Dr. Ellene Route few years ago, neck lower extremity edema.  No history of heart problems per patient.  Patient with above history who lives at home with his wife has limited mobility due to back pain had a mechanical fall when he slipped on hardwood floor about 10 days ago, he is gently starting having pain in his left hip but avoided medical attention finally today went with his son to see orthopedic surgeon Dr. French Ana where he was found to have left hip fracture, we were requested to do direct admission.  Patient besides left hip pain which is sharp worse upon bearing weight better with rest nonradiating, no associated symptoms has no other subjective complaints.   Review of systems:     A full 10 point Review of Systems was done, except as stated above, all other Review of Systems were negative.   With Past History of the following :    Past Medical History:  Diagnosis Date  . Anxiety    takes Atrivan daily  . Back pain    chronic with neck pain  . Cancer ([place])    skin  . CHF (congestive heart failure) ([place])   . Constipation    related to medication  . Dementia    takes Aricept nightly  . Enlarged  prostate    self caths once every 1-2wks  . GERD (gastroesophageal reflux disease)    doesn't require medication  . Hx of seasonal allergies   . Hyperlipidemia    takes Simvastatin daily  . Hypertension    takes atenolol daily  . Hypothyroidism    takes Synthroid daily  . Insomnia    related to pain  . MRSA (methicillin resistant staph aureus) culture positive  Per patient tested on 10/07/11.  . Osteoporosis   . Peripheral edema    takes Torsemide daily  . Scoliosis       Past Surgical History:  Procedure Laterality Date  . abdominal cyst     removed-2012;MRSA done by Dr.Gross  . CATARACT EXTRACTION    . cataracts     bilateral  . CERVICAL DISC SURGERY     x1  . COLONOSCOPY    . ESOPHAGOGASTRODUODENOSCOPY    . HERNIA REPAIR     Right inguinal  . LUMBAR DISC SURGERY     x2  . TOE SURGERY     right great toe  . TRANSURETHRAL RESECTION OF PROSTATE     15+yrs ago      Social History:     Social History   Tobacco Use  . Smoking status: Never Smoker  . Smokeless tobacco: Never Used  Substance Use Topics  . Alcohol use: No        Family History :     Family History  Problem Relation Age of Onset  . Heart disease Mother 16       Vague history  . Stroke Father   . Hypertension Other        Multiple family members both sides  . Diabetes Other   . Anesthesia problems Neg Hx   . Hypotension Neg Hx   . Malignant hyperthermia Neg Hx   . Pseudochol deficiency Neg Hx       Home Medications:   Prior to Admission medications   Medication Sig Start Date End Date Taking? Authorizing Provider  aspirin EC 81 MG tablet Take 81 mg by mouth daily.   Yes [provider]  atenolol (TENORMIN) 50 MG tablet Take 1 tablet (50 mg total) by mouth daily. 10/20/17  Yes Dorena Cookey, MD  donepezil (ARICEPT) 5 MG tablet TAKE 1 TABLET (5 MG TOTAL) BY MOUTH AT BEDTIME. Patient taking differently: Take 5 mg by mouth at bedtime.  10/20/17  Yes Dorena Cookey, MD    levothyroxine (SYNTHROID, LEVOTHROID) 50 MCG tablet TAKE 1 TABLET BY MOUTH ONCE DAILY *PT NEEDS OFFICE VISIT* Patient taking differently: Take 50 mcg by mouth daily before breakfast.  10/20/17  Yes Dorena Cookey, MD  LORazepam (ATIVAN) 0.5 MG tablet TAKE 1 TABLET AT BEDTIME WHEN NECESSARY Patient taking differently: Take 0.5 mg by mouth at bedtime as needed for sleep.  09/12/18  Yes Isaac Bliss, Rayford Halsted, MD  simvastatin (ZOCOR) 40 MG tablet TAKE 1 TABLET BY MOUTH EVERY NIGHT Patient taking differently: Take 40 mg by mouth daily at 6 PM.  10/20/17  Yes Dorena Cookey, MD  torsemide (DEMADEX) 20 MG tablet Take 1 tablet (20 mg total) by mouth daily. 10/20/17  Yes Dorena Cookey, MD     Allergies:     Allergies  Allergen Reactions  . Doxazosin Mesylate Other (See Comments)    Makes blood pressure  . Propoxyphene Hcl Other (See Comments)    REACTION: upset stomach  . Penicillins Rash    DID THE REACTION INVOLVE: Swelling of the face/tongue/throat, SOB, or low BP? No Sudden or severe rash/hives, skin peeling, or the inside of the mouth or nose? Yes Did it require medical treatment? Yes When did it last happen?50 yrs ago If all above answers are "NO", may proceed with cephalosporin use.      Physical Exam:   Vitals  Blood pressure (!) 141/97, pulse 95, temperature (!) 97.5 F (36.4 C),  temperature source Oral, resp. rate 16, SpO2 97 %.   1. General Pleasant elderly Caucasian male lying in hospital bed in no distress,  2. Normal affect and insight, Not Suicidal or Homicidal, Awake Alert, Oriented X 3.  3. No F.N deficits, ALL C.Nerves Intact, Strength 5/5 all 4 extremities, Sensation intact all 4 extremities, Plantars down going.  4. Ears and Eyes appear Normal, Conjunctivae clear, PERRLA. Moist Oral Mucosa.  5. Supple Neck, No JVD, No cervical lymphadenopathy appriciated, No Carotid Bruits.  6. Symmetrical Chest wall movement, Good air movement bilaterally,  CTAB.  7. RRR, No Gallops, Rubs or Murmurs, No Parasternal Heave.  8. Positive Bowel Sounds, Abdomen Soft, No tenderness, No organomegaly appriciated,No rebound -guarding or rigidity.  9.  No Cyanosis, Normal Skin Turgor, No Skin Rash or Bruise.  Left leg externally rotated, chronic 2+ lower extremity leg edema,  10. Good muscle tone,  joints appear normal , no effusions, Normal ROM.  11. No Palpable Lymph Nodes in Neck or Axillae     Data Review:    Baseline Labs ordered and pending    CBC No results for input(s): WBC, HGB, HCT, PLT, MCV, MCH, MCHC, RDW, LYMPHSABS, MONOABS, EOSABS, BASOSABS, BANDABS in the last 168 hours.  Invalid input(s): NEUTRABS, BANDSABD ------------------------------------------------------------------------------------------------------------------  Chemistries  No results for input(s): NA, K, CL, CO2, GLUCOSE, BUN, CREATININE, CALCIUM, MG, AST, ALT, ALKPHOS, BILITOT in the last 168 hours.  Invalid input(s): GFRCGP ------------------------------------------------------------------------------------------------------------------ CrCl cannot be calculated (Patient's most recent lab result is older than the maximum 21 days allowed.). ------------------------------------------------------------------------------------------------------------------ No results for input(s): TSH, T4TOTAL, T3FREE, THYROIDAB in the last 72 hours.  Invalid input(s): FREET3  Coagulation profile No results for input(s): INR, PROTIME in the last 168 hours. ------------------------------------------------------------------------------------------------------------------- No results for input(s): DDIMER in the last 72 hours. -------------------------------------------------------------------------------------------------------------------  Cardiac Enzymes No results for input(s): CKMB, TROPONINI, MYOGLOBIN in the last 168 hours.  Invalid input(s):  CK ------------------------------------------------------------------------------------------------------------------ No results found for: BNP   ---------------------------------------------------------------------------------------------------------------  Urinalysis    Component Value Date/Time   COLORURINE YELLOW 02/15/2012 [place] 02/15/2012 1335   LABSPEC 1.015 02/15/2012 1335   PHURINE 5.5 02/15/2012 1335   GLUCOSEU NEGATIVE 02/15/2012 1335   HGBUR NEGATIVE 02/15/2012 1335   BILIRUBINUR n 11/11/2015 1716   KETONESUR NEGATIVE 02/15/2012 1335   PROTEINUR n 11/11/2015 1716   PROTEINUR NEGATIVE 02/15/2012 1335   UROBILINOGEN 0.2 11/11/2015 1716   UROBILINOGEN 0.2 02/15/2012 1335   NITRITE n 11/11/2015 1716   NITRITE NEGATIVE 02/15/2012 1335   LEUKOCYTESUR Negative 11/11/2015 1716    ----------------------------------------------------------------------------------------------------------------   Imaging Results:    Dg Chest Port 1 View  Result Date: 10/11/2018 CLINICAL DATA:  Preop for surgery to repair fractured hip EXAM: PORTABLE CHEST 1 VIEW COMPARISON:  Chest x-ray of 02/15/2012 FINDINGS: No active infiltrate or effusion is seen. Skin folds overlie both lung fields. Mediastinal and hilar contours are unremarkable and mild cardiomegaly is stable. Ectasia of the descending thoracic aorta appears stable. Neurostimulator electrode leads overlie the T7-8 level. A lower anterior cervical spine fusion plate is present. IMPRESSION: No active lung disease.  Stable mild cardiomegaly. Electronically Signed   By: Ivar Drape M.D.   On: 10/11/2018 15:07    My personal review of EKG: Rhythm NSR, Old right bundle branch block   Assessment & Plan:     1. Mechanical Fall with L. Intertrochanteric fracture x 10 days prior to admission - admit, pain control Discussed with Dr Shireen Quan, he will inform Dr Percell Miller  for am surgery, NPO after midnight, Heparin for DVT  prophylaxis for now.  Type screen ordered.  Cardio-Pulm Risk stratification for surgery and recommendations to minimize the same:-  A.Cardio-Pulmonary Risk -  this patient is a moderate risk  for adverse Cardio-Pulmonary  Outcome  from surgery, the risks and benefits were discussed and acceptable to patient and his son.  Recommendations for optimizing Cardio-Pulmonary  Risk risk factors  1. Keep SBP<140, HR<85, use Lopressor 5mg  IV q4hrs PRN, or B.Blocker drip PRN. 2. Moniotr I&Os. 3. Minimal sedation and Narcotics. 4. Good pulmunary toilet. 5. PRN Nebs and as needed oxygen to keep Pox>90% 6. Hb>8, transfuse as needed- Lasix 10mg  IV after each unit PRBC Transfused.   B.Bleeding Risk - no previous surgical complications, no easy bruising,  Antiplate meds ASA 81mg .   Lab Results  Component Value Date   PLT 219.0 11/18/2016                  Lab Results  Component Value Date   INR 1.04 02/15/2012      Will request Surgeon to please Order DVT prophylaxis of his/her choice, along with activity, weight bearing precautions and diet if appropriate.     2. HTN - home Tenormin has been ordered along with PRN IV Lopressor and hydralazine.  3.  Dyslipidemia.  Continue home dose statin.  4.  BPH with history of urethral scar.  Will place on Flomax, intermittent self cath every 6 hours as needed which he does at home will be continued.  He required Foley for the short-term.  5.  Hypothyroidism.  Continue home dose Synthroid.  6.  Chronic back pain.  Has spinal stimulator, PRN pain medications.  7.  Chronic lower extremity edema.  Denies any history of CHF, could have right-sided heart failure or venous stasis.  Continue home dose Demadex.  Monitor electrolytes intermittently.  8. ? CKD 3.  Last creatinine in our system was 1.5 for many years ago.  Check baseline creatinine and monitor.  9.  Early dementia.  At risk for delirium.  Patient and family educated, PRN Haldol.  Avoid  sedative benzodiazepine and narcotic use.     DVT Prophylaxis  Heparin   AM Labs Ordered, also please review Full Orders  Family Communication: Admission, patients condition and plan of care including tests being ordered have been discussed with the patient and son who indicate understanding and agree with the plan and Code Status.  Code Status Full  Likely DC to  SNF  Condition GUARDED    Consults called: Ortho    Admission status: Inpt    Time spent in minutes : 35   Lala Lund M.D on 10/11/2018 at 3:17 PM  To page go to [email or web address] - password [email or web address]

## 2018-10-12 ENCOUNTER — Inpatient Hospital Stay (HOSPITAL_COMMUNITY): Payer: Medicare Other | Admitting: Certified Registered Nurse Anesthetist

## 2018-10-12 ENCOUNTER — Inpatient Hospital Stay (HOSPITAL_COMMUNITY): Payer: Medicare Other

## 2018-10-12 ENCOUNTER — Encounter (HOSPITAL_COMMUNITY)
Admission: AD | Disposition: A | Discharge status: Skilled Nursing Facility | Payer: Self-pay | Source: Ambulatory Visit | Attending: Internal Medicine

## 2018-10-12 DIAGNOSIS — E7800 Pure hypercholesterolemia, unspecified: Secondary | ICD-10-CM

## 2018-10-12 DIAGNOSIS — I10 Essential (primary) hypertension: Secondary | ICD-10-CM | POA: Diagnosis not present

## 2018-10-12 DIAGNOSIS — E039 Hypothyroidism, unspecified: Secondary | ICD-10-CM | POA: Diagnosis not present

## 2018-10-12 DIAGNOSIS — E785 Hyperlipidemia, unspecified: Secondary | ICD-10-CM | POA: Diagnosis not present

## 2018-10-12 DIAGNOSIS — S72002A Fracture of unspecified part of neck of left femur, initial encounter for closed fracture: Secondary | ICD-10-CM | POA: Diagnosis not present

## 2018-10-12 DIAGNOSIS — S72142G Displaced intertrochanteric fracture of left femur, subsequent encounter for closed fracture with delayed healing: Secondary | ICD-10-CM

## 2018-10-12 HISTORY — PX: INTRAMEDULLARY (IM) NAIL INTERTROCHANTERIC: SHX5875

## 2018-10-12 LAB — BASIC METABOLIC PANEL
Anion gap: 8 (ref 5–15)
BUN: 44 mg/dL — ABNORMAL HIGH (ref 8–23)
CO2: 24 mmol/L (ref 22–32)
Calcium: 8.9 mg/dL (ref 8.9–10.3)
Chloride: 106 mmol/L (ref 98–111)
Creatinine, Ser: 1.92 mg/dL — ABNORMAL HIGH (ref 0.61–1.24)
GFR calc Af Amer: 35 mL/min — ABNORMAL LOW (ref >60)
GFR calc non Af Amer: 30 mL/min — ABNORMAL LOW (ref >60)
GLUCOSE: 97 mg/dL (ref 70–99)
Potassium: 4.7 mmol/L (ref 3.5–5.1)
Sodium: 138 mmol/L (ref 135–145)

## 2018-10-12 LAB — CBC
HCT: 36.7 % — ABNORMAL LOW (ref 39.0–52.0)
Hemoglobin: 11.9 g/dL — ABNORMAL LOW (ref 13.0–17.0)
MCH: 29.9 pg (ref 26.0–34.0)
MCHC: 32.4 g/dL (ref 30.0–36.0)
MCV: 92.2 fL (ref 80.0–100.0)
Platelets: 235 10*3/uL (ref 150–400)
RBC: 3.98 MIL/uL — ABNORMAL LOW (ref 4.22–5.81)
RDW: 13.5 % (ref 11.5–15.5)
WBC: 7.7 10*3/uL (ref 4.0–10.5)
nRBC: 0 % (ref 0.0–0.2)

## 2018-10-12 LAB — SURGICAL PCR SCREEN
MRSA, PCR: NEGATIVE
Staphylococcus aureus: NEGATIVE

## 2018-10-12 SURGERY — FIXATION, FRACTURE, INTERTROCHANTERIC, WITH INTRAMEDULLARY ROD
Anesthesia: Monitor Anesthesia Care | Laterality: Left

## 2018-10-12 MED ORDER — POVIDONE-IODINE 10 % EX SWAB: 2.0000 "application " | Freq: Once | CUTANEOUS | Status: DC

## 2018-10-12 MED ORDER — CHLORHEXIDINE GLUCONATE 4 % EX LIQD: 60.0000 mL | Freq: Once | CUTANEOUS | Status: DC

## 2018-10-12 MED ORDER — CEFAZOLIN SODIUM-DEXTROSE 2-4 GM/100ML-% IV SOLN
INTRAVENOUS | Status: AC
  Filled 2018-10-12: qty 100

## 2018-10-12 MED ORDER — 0.9 % SODIUM CHLORIDE (POUR BTL) OPTIME
TOPICAL | Status: DC | PRN
  Administered 2018-10-12: 1000 mL

## 2018-10-12 MED ORDER — ALBUMIN HUMAN 5 % IV SOLN
INTRAVENOUS | Status: AC
  Filled 2018-10-12: qty 250

## 2018-10-12 MED ORDER — FLEET ENEMA 7-19 GM/118ML RE ENEM: 1.0000 | ENEMA | Freq: Once | RECTAL | Status: DC | PRN

## 2018-10-12 MED ORDER — CEFAZOLIN SODIUM-DEXTROSE 2-4 GM/100ML-% IV SOLN
2.0000 g | Freq: Four times a day (QID) | INTRAVENOUS | Status: AC
  Administered 2018-10-12 (×2): 2 g via INTRAVENOUS
  Filled 2018-10-12 (×2): qty 100

## 2018-10-12 MED ORDER — EPHEDRINE SULFATE 50 MG/ML IJ SOLN
INTRAMUSCULAR | Status: DC | PRN
  Administered 2018-10-12: 10 mg via INTRAVENOUS
  Administered 2018-10-12: 5 mg via INTRAVENOUS
  Administered 2018-10-12: 10 mg via INTRAVENOUS

## 2018-10-12 MED ORDER — ALBUMIN HUMAN 5 % IV SOLN
12.5000 g | Freq: Once | INTRAVENOUS | Status: AC
  Administered 2018-10-12: 12.5 g via INTRAVENOUS

## 2018-10-12 MED ORDER — CEFAZOLIN SODIUM-DEXTROSE 2-4 GM/100ML-% IV SOLN
2.0000 g | INTRAVENOUS | Status: AC
  Administered 2018-10-12: 2 g via INTRAVENOUS

## 2018-10-12 MED ORDER — DOCUSATE SODIUM 100 MG PO CAPS
100.0000 mg | ORAL_CAPSULE | Freq: Two times a day (BID) | ORAL | Status: DC
  Administered 2018-10-12 – 2018-10-15 (×7): 100 mg via ORAL
  Filled 2018-10-12 (×7): qty 1

## 2018-10-12 MED ORDER — ONDANSETRON HCL 4 MG/2ML IJ SOLN
INTRAMUSCULAR | Status: DC | PRN
  Administered 2018-10-12: 4 mg via INTRAVENOUS

## 2018-10-12 MED ORDER — PROPOFOL 10 MG/ML IV BOLUS
INTRAVENOUS | Status: DC | PRN
  Administered 2018-10-12: 30 mg via INTRAVENOUS

## 2018-10-12 MED ORDER — PHENYLEPHRINE 40 MCG/ML (10ML) SYRINGE FOR IV PUSH (FOR BLOOD PRESSURE SUPPORT)
PREFILLED_SYRINGE | INTRAVENOUS | Status: AC
  Filled 2018-10-12: qty 10

## 2018-10-12 MED ORDER — ENOXAPARIN SODIUM 30 MG/0.3ML ~~LOC~~ SOLN
30.0000 mg | SUBCUTANEOUS | Status: DC
  Administered 2018-10-13 – 2018-10-15 (×3): 30 mg via SUBCUTANEOUS
  Filled 2018-10-12 (×4): qty 0.3

## 2018-10-12 MED ORDER — METOCLOPRAMIDE HCL 5 MG PO TABS: 5.0000 mg | ORAL_TABLET | Freq: Three times a day (TID) | ORAL | Status: DC | PRN

## 2018-10-12 MED ORDER — ENOXAPARIN SODIUM 30 MG/0.3ML ~~LOC~~ SOLN: 30.0000 mg | SUBCUTANEOUS | 0 refills | Status: DC

## 2018-10-12 MED ORDER — SODIUM CHLORIDE 0.9 % IV SOLN
INTRAVENOUS | Status: DC | PRN
  Administered 2018-10-12: 15 ug/min via INTRAVENOUS

## 2018-10-12 MED ORDER — PROPOFOL 500 MG/50ML IV EMUL
INTRAVENOUS | Status: DC | PRN
  Administered 2018-10-12: 25 ug/kg/min via INTRAVENOUS

## 2018-10-12 MED ORDER — BUPIVACAINE IN DEXTROSE 0.75-8.25 % IT SOLN
INTRATHECAL | Status: DC | PRN
  Administered 2018-10-12: 1.6 mL via INTRATHECAL

## 2018-10-12 MED ORDER — MORPHINE SULFATE (PF) 2 MG/ML IV SOLN: 0.2500 mg | INTRAVENOUS | Status: DC | PRN

## 2018-10-12 MED ORDER — EPHEDRINE 5 MG/ML INJ
INTRAVENOUS | Status: AC
  Filled 2018-10-12: qty 10

## 2018-10-12 MED ORDER — POLYETHYLENE GLYCOL 3350 17 G PO PACK: 17.0000 g | PACK | Freq: Every day | ORAL | Status: DC | PRN

## 2018-10-12 MED ORDER — ONDANSETRON HCL 4 MG/2ML IJ SOLN: 4.0000 mg | Freq: Four times a day (QID) | INTRAMUSCULAR | Status: DC | PRN

## 2018-10-12 MED ORDER — SUCCINYLCHOLINE CHLORIDE 200 MG/10ML IV SOSY
PREFILLED_SYRINGE | INTRAVENOUS | Status: AC
  Filled 2018-10-12: qty 10

## 2018-10-12 MED ORDER — ONDANSETRON HCL 4 MG PO TABS: 4.0000 mg | ORAL_TABLET | Freq: Four times a day (QID) | ORAL | Status: DC | PRN

## 2018-10-12 MED ORDER — ACETAMINOPHEN 500 MG PO TABS
500.0000 mg | ORAL_TABLET | Freq: Four times a day (QID) | ORAL | Status: AC
  Administered 2018-10-12 – 2018-10-13 (×4): 500 mg via ORAL
  Filled 2018-10-12 (×4): qty 1

## 2018-10-12 MED ORDER — ACETAMINOPHEN 500 MG PO TABS: 1000.0000 mg | ORAL_TABLET | Freq: Once | ORAL | Status: DC

## 2018-10-12 MED ORDER — ONDANSETRON HCL 4 MG/2ML IJ SOLN
INTRAMUSCULAR | Status: AC
  Filled 2018-10-12: qty 2

## 2018-10-12 MED ORDER — PROPOFOL 1000 MG/100ML IV EMUL
INTRAVENOUS | Status: AC
  Filled 2018-10-12: qty 100

## 2018-10-12 MED ORDER — ROCURONIUM BROMIDE 50 MG/5ML IV SOSY
PREFILLED_SYRINGE | INTRAVENOUS | Status: AC
  Filled 2018-10-12: qty 5

## 2018-10-12 MED ORDER — METOCLOPRAMIDE HCL 5 MG/ML IJ SOLN: 5.0000 mg | Freq: Three times a day (TID) | INTRAMUSCULAR | Status: DC | PRN

## 2018-10-12 MED ORDER — PROPOFOL 10 MG/ML IV BOLUS
INTRAVENOUS | Status: AC
  Filled 2018-10-12: qty 20

## 2018-10-12 MED ORDER — LIDOCAINE 2% (20 MG/ML) 5 ML SYRINGE
INTRAMUSCULAR | Status: AC
  Filled 2018-10-12: qty 5

## 2018-10-12 MED ORDER — HYDROCODONE-ACETAMINOPHEN 5-325 MG PO TABS: 1.0000 | ORAL_TABLET | Freq: Four times a day (QID) | ORAL | 0 refills | Status: AC | PRN

## 2018-10-12 MED ORDER — DEXAMETHASONE SODIUM PHOSPHATE 10 MG/ML IJ SOLN
INTRAMUSCULAR | Status: AC
  Filled 2018-10-12: qty 2

## 2018-10-12 MED ORDER — FENTANYL CITRATE (PF) 250 MCG/5ML IJ SOLN
INTRAMUSCULAR | Status: AC
  Filled 2018-10-12: qty 5

## 2018-10-12 MED ORDER — BISACODYL 10 MG RE SUPP: 10.0000 mg | Freq: Every day | RECTAL | Status: DC | PRN

## 2018-10-12 SURGICAL SUPPLY — 49 items
ADH SKN CLS APL DERMABOND .7 (GAUZE/BANDAGES/DRESSINGS) ×1
BRUSH SCRUB SURG 4.25 DISP (MISCELLANEOUS) ×2 IMPLANT
CHLORAPREP W/TINT 26ML (MISCELLANEOUS) ×3 IMPLANT
COVER PERINEAL POST (MISCELLANEOUS) ×3 IMPLANT
COVER SURGICAL LIGHT HANDLE (MISCELLANEOUS) ×3 IMPLANT
COVER WAND RF STERILE (DRAPES) ×3 IMPLANT
DERMABOND ADVANCED (GAUZE/BANDAGES/DRESSINGS) ×2
DERMABOND ADVANCED .7 DNX12 (GAUZE/BANDAGES/DRESSINGS) ×1 IMPLANT
DRAPE HALF SHEET 40X57 (DRAPES) ×6 IMPLANT
DRAPE IMP U-DRAPE 54X76 (DRAPES) ×9 IMPLANT
DRAPE INCISE IOBAN 66X45 STRL (DRAPES) ×3 IMPLANT
DRAPE STERI IOBAN 125X83 (DRAPES) ×3 IMPLANT
DRAPE SURG 17X23 STRL (DRAPES) ×6 IMPLANT
DRAPE U-SHAPE 47X51 STRL (DRAPES) ×3 IMPLANT
DRSG ADAPTIC 3X8 NADH LF (GAUZE/BANDAGES/DRESSINGS) ×2 IMPLANT
DRSG MEPILEX BORDER 4X4 (GAUZE/BANDAGES/DRESSINGS) ×5 IMPLANT
DRSG MEPILEX BORDER 4X8 (GAUZE/BANDAGES/DRESSINGS) ×3 IMPLANT
ELECT REM PT RETURN 9FT ADLT (ELECTROSURGICAL) ×3
ELECTRODE REM PT RTRN 9FT ADLT (ELECTROSURGICAL) ×1 IMPLANT
GLOVE BIO SURGEON STRL SZ 6.5 (GLOVE) ×6 IMPLANT
GLOVE BIO SURGEON STRL SZ7.5 (GLOVE) ×12 IMPLANT
GLOVE BIO SURGEONS STRL SZ 6.5 (GLOVE) ×3
GLOVE BIOGEL PI IND STRL 6.5 (GLOVE) ×1 IMPLANT
GLOVE BIOGEL PI IND STRL 7.5 (GLOVE) ×1 IMPLANT
GLOVE BIOGEL PI INDICATOR 6.5 (GLOVE) ×2
GLOVE BIOGEL PI INDICATOR 7.5 (GLOVE) ×2
GOWN STRL REUS W/ TWL LRG LVL3 (GOWN DISPOSABLE) ×1 IMPLANT
GOWN STRL REUS W/TWL LRG LVL3 (GOWN DISPOSABLE) ×3
GUIDEROD T2 3X1000 (ROD) ×2 IMPLANT
K-WIRE  3.2X450M STR (WIRE) ×2
K-WIRE 3.2X450M STR (WIRE) ×1
KIT BASIN OR (CUSTOM PROCEDURE TRAY) ×3 IMPLANT
KIT NAIL LONG 10X360MMX125 (Nail) ×2 IMPLANT
KIT TURNOVER KIT B (KITS) ×3 IMPLANT
KWIRE 3.2X450M STR (WIRE) IMPLANT
LINER BOOT UNIVERSAL DISP (MISCELLANEOUS) ×3 IMPLANT
MANIFOLD NEPTUNE II (INSTRUMENTS) ×3 IMPLANT
NS IRRIG 1000ML POUR BTL (IV SOLUTION) ×3 IMPLANT
PACK GENERAL/GYN (CUSTOM PROCEDURE TRAY) ×3 IMPLANT
PAD ARMBOARD 7.5X6 YLW CONV (MISCELLANEOUS) ×6 IMPLANT
SCREW LAG GAMMA 3 TI 10.5X105M (Screw) ×2 IMPLANT
SUT ETHILON 3 0 PS 1 (SUTURE) ×2 IMPLANT
SUT MNCRL AB 3-0 PS2 18 (SUTURE) ×3 IMPLANT
SUT VIC AB 0 CT1 27 (SUTURE)
SUT VIC AB 0 CT1 27XBRD ANBCTR (SUTURE) IMPLANT
SUT VIC AB 2-0 CT1 27 (SUTURE) ×6
SUT VIC AB 2-0 CT1 TAPERPNT 27 (SUTURE) ×2 IMPLANT
TOWEL OR 17X26 10 PK STRL BLUE (TOWEL DISPOSABLE) ×6 IMPLANT
WATER STERILE IRR 1000ML POUR (IV SOLUTION) ×3 IMPLANT

## 2018-10-12 NOTE — Progress Notes (Signed)
PROGRESS NOTE                                                                                                                                                                                                             Patient Demographics:    Wayne [name], is a 85 y.o. male, DOB - January 27, 1926, YIR:[id number]  Admit date - 10/11/2018   Admitting Physician Karmen Bongo, MD  Outpatient Primary MD for the patient is Isaac Bliss, Rayford Halsted, MD  LOS - 1  CC - fall with L.Hip pain     Brief Narrative     Wayne [name]  is a 85 y.o. male, with history of hypertension, dyslipidemia, BPH self caths himself intermittently once or twice a day as needed, chronic urethral scar, early memory loss, chronic back pain status post spinal stimulator placement by Dr. Ellene Route few years ago, neck lower extremity edema.  No history of heart problems per patient.  Patient with above history who lives at home with his wife has limited mobility due to back pain had a mechanical fall when he slipped on hardwood floor about 10 days ago, he is gently starting having pain in his left hip but avoided medical attention finally today went with his son to see orthopedic surgeon Dr. French Ana where he was found to have left hip fracture, we were requested to do direct admission.  Patient besides left hip pain which is sharp worse upon bearing weight better with rest nonradiating, no associated symptoms has no other subjective complaints.   Subjective:    Wayne [name] today has, No headache, No chest pain, No abdominal pain - No Nausea, No new weakness tingling or numbness, No Cough - SOB.    Assessment  & Plan :     1. Mechanical Fall with L. Intertrochanteric fracture x 10 days prior to admission - admit, pain control Discussed with Dr Shireen Quan & Percell Miller, ORIF today,  Type screen ordered, will need SNF.   Cardio-Pulm Risk stratification for surgery and recommendations to minimize the  same:-  A.Cardio-Pulmonary Risk -  this patient is a mpderate risk  for adverse Cardio-Pulmonary  Outcome  from surgery, the risks and benefits were discussed and acceptable to patient and the son.  Recommendations for optimizing Cardio-Pulmonary  Risk risk factors  1. Keep SBP<140, HR<85, use Lopressor 5mg  IV q4hrs PRN, or B.Blocker drip PRN. 2. Moniotr I&Os. 3. Minimal sedation and Narcotics. 4. Good pulmunary toilet. 5. PRN Nebs and as needed oxygen to keep  Pox>90% 6. Hb>8, transfuse as needed- Lasix 10mg  IV after each unit PRBC Transfused.   B.Bleeding Risk - no previous surgical complications, no easy bruising,  Antiplate meds ASA 81mg .   Lab Results  Component Value Date   PLT 235 10/12/2018                  Lab Results  Component Value Date   INR 1.08 10/11/2018   INR 1.04 02/15/2012      Will request Surgeon to please Order DVT prophylaxis of his/her choice, along with activity, weight bearing precautions and diet if appropriate.     2. HTN - home Tenormin has been ordered along with PRN IV Lopressor and hydralazine.  3.  Dyslipidemia.  Continue home dose statin.  4.  BPH with history of urethral scar.  Will place on Flomax, intermittent self cath every 6 hours as needed which he does at home will be continued.  He required Foley for the short-term.  5.  Hypothyroidism.  Continue home dose Synthroid.  6.  Chronic back pain.  Has spinal stimulator, PRN pain medications.  7.  Chronic lower extremity edema.  Denies any history of CHF, could have right-sided heart failure or venous stasis.  Continue home dose Demadex.  Monitor electrolytes intermittently.  8. CKD 4.  Last creatinine in our system was 1.5 for many years ago.  Likely new baseline is around 2.  9.  Early dementia.  At risk for delirium.  Patient and family educated, PRN Haldol.  Avoid sedative benzodiazepine and narcotic use.   Family Communication  :  Son and wife  Code Status :   Full  Disposition Plan  :  SNF  Consults  :  Ortho  Procedures  :    ORIF due 10/12/18  DVT Prophylaxis  :    Heparin   Lab Results  Component Value Date   PLT 235 10/12/2018    Diet :  Diet Order            Diet NPO time specified Except for: Sips with Meds  Diet effective midnight        Diet NPO time specified Except for: Sips with Meds  Diet effective midnight               Inpatient Medications Scheduled Meds: . acetaminophen  1,000 mg Oral Once  . [MAR Hold] aspirin EC  81 mg Oral Daily  . [MAR Hold] atenolol  50 mg Oral Daily  . chlorhexidine  60 mL Topical Once  . [MAR Hold] heparin  5,000 Units Subcutaneous Q8H  . [MAR Hold] Influenza vac split quadrivalent PF  0.5 mL Intramuscular Tomorrow-1000  . [MAR Hold] levothyroxine  50 mcg Oral Q0600  . [MAR Hold] LORazepam  0.5 mg Oral QHS  . povidone-iodine  2 application Topical Once  . [MAR Hold] simvastatin  40 mg Oral q1800  . [MAR Hold] torsemide  20 mg Oral Daily   Continuous Infusions: . ceFAZolin    .  ceFAZolin (ANCEF) IV    . lactated ringers 50 mL/hr at 10/12/18 0606   PRN Meds:.[MAR Hold] acetaminophen **OR** [MAR Hold] acetaminophen, [MAR Hold] haloperidol lactate, [MAR Hold] hydrALAZINE, [MAR Hold] HYDROcodone-acetaminophen, [MAR Hold] metoprolol tartrate, [MAR Hold] senna-docusate  Antibiotics  :   Anti-infectives (From admission, onward)   Start     Dose/Rate Route Frequency Ordered Stop   10/12/18 0745  ceFAZolin (ANCEF) IVPB 2g/100 mL premix     2 g 200 mL/hr  over 30 Minutes Intravenous On call to O.R. 10/12/18 0740 10/13/18 0559   10/12/18 0743  ceFAZolin (ANCEF) 2-4 GM/100ML-% IVPB    Note to Pharmacy:  Maryjean Ka   : cabinet override      10/12/18 0743 10/12/18 1944          Objective:   Vitals:   10/11/18 1428 10/11/18 1541 10/11/18 2025 10/12/18 0506  BP: (!) 141/97  104/75 127/70  Pulse: 95  67 (!) 50  Resp: 16  18 19   Temp: (!) 97.5 F (36.4 C)  98.3 F (36.8 C) 97.9  F (36.6 C)  TempSrc: Oral  Oral Oral  SpO2: 97%  94% 98%  Weight:  66.2 kg    Height:  5\' 7"  (1.702 m)      Wt Readings from Last 3 Encounters:  10/11/18 66.2 kg  10/27/17 67.1 kg  10/20/17 67.1 kg     Intake/Output Summary (Last 24 hours) at 10/12/2018 0849 Last data filed at 10/12/2018 0500 Gross per 24 hour  Intake -  Output 150 ml  Net -150 ml     Physical Exam  Awake Alert, Oriented X 3, No new F.N deficits, Normal affect [place].AT,PERRAL Supple Neck,No JVD, No cervical lymphadenopathy appriciated.  Symmetrical Chest wall movement, Good air movement bilaterally, CTAB RRR,No Gallops,Rubs or new Murmurs, No Parasternal Heave +ve B.Sounds, Abd Soft, No tenderness, No organomegaly appriciated, No rebound - guarding or rigidity. No Cyanosis, Clubbing or edema, No new Rash or bruise, +ve L hip pain on movement    Data Review:    CBC Recent Labs  Lab 10/11/18 1503 10/12/18 0356  WBC 12.8* 7.7  HGB 14.4 11.9*  HCT 44.6 36.7*  PLT 352 235  MCV 92.9 92.2  MCH 30.0 29.9  MCHC 32.3 32.4  RDW 13.5 13.5    Chemistries  Recent Labs  Lab 10/11/18 1503 10/12/18 0356  NA 143 138  K 4.9 4.7  CL 109 106  CO2 20* 24  GLUCOSE 130* 97  BUN 45* 44*  CREATININE 2.11* 1.92*  CALCIUM 9.5 8.9   ------------------------------------------------------------------------------------------------------------------ No results for input(s): CHOL, HDL, LDLCALC, TRIG, CHOLHDL, LDLDIRECT in the last 72 hours.  No results found for: HGBA1C ------------------------------------------------------------------------------------------------------------------ No results for input(s): TSH, T4TOTAL, T3FREE, THYROIDAB in the last 72 hours.  Invalid input(s): FREET3 ------------------------------------------------------------------------------------------------------------------ No results for input(s): VITAMINB12, FOLATE, FERRITIN, TIBC, IRON, RETICCTPCT in the last 72 hours.  Coagulation  profile Recent Labs  Lab 10/11/18 1503  INR 1.08    No results for input(s): DDIMER in the last 72 hours.  Cardiac Enzymes No results for input(s): CKMB, TROPONINI, MYOGLOBIN in the last 168 hours.  Invalid input(s): CK ------------------------------------------------------------------------------------------------------------------ No results found for: BNP  Micro Results Recent Results (from the past 240 hour(s))  Surgical pcr screen     Status: None   Collection Time: 10/11/18 10:52 PM  Result Value Ref Range Status   MRSA, PCR NEGATIVE NEGATIVE Final   Staphylococcus aureus NEGATIVE NEGATIVE Final    Comment: (NOTE) The Xpert SA Assay (FDA approved for NASAL specimens in patients 27 years of age and older), is one component of a comprehensive surveillance program. It is not intended to diagnose infection nor to guide or monitor treatment. Performed at [facility], [place] [address], [place], [place] [address]     Radiology Reports Dg Chest [place] 1 View  Result Date: 10/11/2018 CLINICAL DATA:  Preop for surgery to repair fractured hip EXAM: PORTABLE CHEST 1 VIEW COMPARISON:  Chest x-ray of 02/15/2012 FINDINGS: No active infiltrate or effusion is seen. Skin folds overlie both lung fields. Mediastinal and hilar contours are unremarkable and mild cardiomegaly is stable. Ectasia of the descending thoracic aorta appears stable. Neurostimulator electrode leads overlie the T7-8 level. A lower anterior cervical spine fusion plate is present. IMPRESSION: No active lung disease.  Stable mild cardiomegaly. Electronically Signed   By: Ivar Drape M.D.   On: 10/11/2018 15:07    Time Spent in minutes  30   Lala Lund M.D on 10/12/2018 at 8:49 AM  To page go to [email or web address] - password [facility]

## 2018-10-12 NOTE — Transfer of Care (Signed)
Immediate Anesthesia Transfer of Care Note  Patient: Wayne [name]  Procedure(s) Performed: INTRAMEDULLARY (IM) NAIL INTERTROCHANTRIC (Left )  Patient Location: PACU  Anesthesia Type:MAC and Spinal  Level of Consciousness: awake, alert , oriented and patient cooperative  Airway & Oxygen Therapy: Patient Spontanous Breathing and Patient connected to nasal cannula oxygen  Post-op Assessment: Report given to RN and Post -op Vital signs reviewed and stable  Post vital signs: Reviewed and stable 1st bp 67/45 treated with 126mg pheneylephrine and trendelenburg position.  Repeat BP 145/75  Last Vitals:  Vitals Value Taken Time  BP 140/67 10/12/2018 10:25 AM  Temp    Pulse 47 10/12/2018 10:26 AM  Resp 17 10/12/2018 10:26 AM  SpO2 97 % 10/12/2018 10:26 AM  Vitals shown include unvalidated device data.  Last Pain:  Vitals:   10/12/18 0513  TempSrc:   PainSc: 7       Patients Stated Pain Goal: 1 [id number]  Complications: No apparent anesthesia complications

## 2018-10-12 NOTE — Anesthesia Postprocedure Evaluation (Signed)
Anesthesia Post Note  Patient: Wayne [name]  Procedure(s) Performed: INTRAMEDULLARY (IM) NAIL INTERTROCHANTRIC (Left )     Patient location during evaluation: PACU Anesthesia Type: MAC and Spinal Level of consciousness: awake and alert Pain management: pain level controlled Vital Signs Assessment: post-procedure vital signs reviewed and stable Respiratory status: spontaneous breathing and respiratory function stable Cardiovascular status: blood pressure returned to baseline and stable Postop Assessment: spinal receding Anesthetic complications: no    Last Vitals:  Vitals:   10/12/18 1136 10/12/18 1151  BP: (!) 94/53 (!) 97/48  Pulse: (!) 50 (!) 50  Resp: 19 10  Temp:    SpO2: 100% 100%    Last Pain:  Vitals:   10/12/18 1021  TempSrc:   PainSc: 0-No pain                 Kyanne Rials DANIEL

## 2018-10-12 NOTE — Anesthesia Preprocedure Evaluation (Addendum)
Anesthesia Evaluation  Patient identified by MRN, date of birth, ID band Patient awake    Reviewed: Allergy & Precautions, NPO status , Patient's Chart, lab work & pertinent test results  History of Anesthesia Complications Negative for: history of anesthetic complications  Airway Mallampati: II  TM Distance: >3 FB Neck ROM: Full    Dental  (+) Dental Advisory Given, Missing, Poor Dentition   Pulmonary neg pulmonary ROS,    Pulmonary exam normal        Cardiovascular hypertension, +CHF  Normal cardiovascular exam     Neuro/Psych PSYCHIATRIC DISORDERS Anxiety Dementia negative neurological ROS     GI/Hepatic Neg liver ROS, GERD  ,  Endo/Other  Hypothyroidism   Renal/GU Renal disease  negative genitourinary   Musculoskeletal negative musculoskeletal ROS (+)   Abdominal   Peds negative pediatric ROS (+)  Hematology negative hematology ROS (+)   Anesthesia Other Findings   Reproductive/Obstetrics negative OB ROS                            Anesthesia Physical Anesthesia Plan  ASA: III  Anesthesia Plan: MAC and Spinal   Post-op Pain Management:    Induction:   PONV Risk Score and Plan: 2 and Ondansetron  Airway Management Planned: Natural Airway and Simple Face Mask  Additional Equipment:   Intra-op Plan:   Post-operative Plan:   Informed Consent: I have reviewed the patients History and Physical, chart, labs and discussed the procedure including the risks, benefits and alternatives for the proposed anesthesia with the patient or authorized representative who has indicated his/her understanding and acceptance.   Dental advisory given  Plan Discussed with: CRNA, Anesthesiologist and Surgeon  Anesthesia Plan Comments:         Anesthesia Quick Evaluation

## 2018-10-12 NOTE — Op Note (Signed)
DATE OF SURGERY:  10/12/2018  TIME: 10:15 AM  PATIENT NAME:  Wayne [name]  AGE: 85 y.o.  PRE-OPERATIVE DIAGNOSIS:  left hip fracture  POST-OPERATIVE DIAGNOSIS:  SAME  PROCEDURE:  INTRAMEDULLARY (IM) NAIL INTERTROCHANTRIC  SURGEON:  Renette Butters  ASSISTANT:  Roxan Hockey, PA-C, he was present and scrubbed throughout the case, critical for completion in a timely fashion, and for retraction, instrumentation, and closure.   OPERATIVE IMPLANTS: [employer]ker Gamma Nail  PREOPERATIVE INDICATIONS:  DAVARIOUS TUMBLESON is a 85 y.o. year old who fell and suffered a hip fracture. He was brought into the ER and then admitted and optimized and then elected for surgical intervention.    The risks benefits and alternatives were discussed with the patient including but not limited to the risks of nonoperative treatment, versus surgical intervention including infection, bleeding, nerve injury, malunion, nonunion, hardware prominence, hardware failure, need for hardware removal, blood clots, cardiopulmonary complications, morbidity, mortality, among others, and they were willing to proceed.    OPERATIVE PROCEDURE:  The patient was brought to the operating room and placed in the supine position. General anesthesia was administered. He was placed on the fracture table.  Closed reduction was performed under C-arm guidance. Time out was then performed after sterile prep and drape. He received preoperative antibiotics.  Incision was made proximal to the greater trochanter. A guidewire was placed in the appropriate position. Confirmation was made on AP and lateral views. The above-named nail was opened. I opened the proximal femur with a reamer. I then placed the nail by hand easily down. I did not need to ream the femur.  Once the nail was completely seated, I placed a guidepin into the femoral head into the center center position. I measured the length, and then reamed the lateral cortex and up into the head. I  then placed the lag screw. Slight compression was applied. Anatomic fixation achieved. Bone quality was mediocre.  I then secured the proximal interlocking bolt, and took off a half a turn, and then removed the instruments, and took final C-arm pictures AP and lateral the entire length of the leg.   Anatomic reconstruction was achieved, and the wounds were irrigated copiously and closed with Vicryl followed by staples and sterile gauze for the skin. The patient was awakened and returned to PACU in stable and satisfactory condition. There no complications and the patient tolerated the procedure well.  He will be weightbearing as tolerated, and will be on chemical px  for a period of four weeks after discharge.   Edmonia Lynch, M.D.

## 2018-10-12 NOTE — Interval H&P Note (Signed)
I participated in the care of this patient and agree with the above history, physical and evaluation. I performed a review of the history and a physical exam as detailed   Odarius Dines Daniel Igor Bishop MD  

## 2018-10-12 NOTE — Anesthesia Procedure Notes (Signed)
Spinal  Patient location during procedure: OR Start time: 10/12/2018 8:48 AM End time: 10/12/2018 8:58 AM Staffing Anesthesiologist: Duane Boston, MD Performed: anesthesiologist  Preanesthetic Checklist Completed: patient identified, surgical consent, pre-op evaluation, timeout performed, IV checked, risks and benefits discussed and monitors and equipment checked Spinal Block Patient position: sitting Prep: DuraPrep Patient monitoring: cardiac monitor, continuous pulse ox and blood pressure Approach: midline Location: L2-3 Injection technique: single-shot Needle Needle type: Quincke  Needle gauge: 22 G Needle length: 9 cm Additional Notes Functioning IV was confirmed and monitors were applied. Sterile prep and drape, including hand hygiene and sterile gloves were used. The patient was positioned and the spine was prepped. The skin was anesthetized with lidocaine.  Free flow of clear CSF was obtained prior to injecting local anesthetic into the CSF.  The spinal needle aspirated freely following injection.  The needle was carefully withdrawn.  The patient tolerated the procedure well.

## 2018-10-13 LAB — CBC
HCT: 30.1 % — ABNORMAL LOW (ref 39.0–52.0)
Hemoglobin: 9.7 g/dL — ABNORMAL LOW (ref 13.0–17.0)
MCH: 30.1 pg (ref 26.0–34.0)
MCHC: 32.2 g/dL (ref 30.0–36.0)
MCV: 93.5 fL (ref 80.0–100.0)
PLATELETS: 180 10*3/uL (ref 150–400)
RBC: 3.22 MIL/uL — ABNORMAL LOW (ref 4.22–5.81)
RDW: 13.6 % (ref 11.5–15.5)
WBC: 7.3 10*3/uL (ref 4.0–10.5)
nRBC: 0 % (ref 0.0–0.2)

## 2018-10-13 LAB — BASIC METABOLIC PANEL
Anion gap: 7 (ref 5–15)
BUN: 38 mg/dL — ABNORMAL HIGH (ref 8–23)
CO2: 23 mmol/L (ref 22–32)
Calcium: 8.3 mg/dL — ABNORMAL LOW (ref 8.9–10.3)
Chloride: 107 mmol/L (ref 98–111)
Creatinine, Ser: 1.73 mg/dL — ABNORMAL HIGH (ref 0.61–1.24)
GFR calc Af Amer: 39 mL/min — ABNORMAL LOW (ref >60)
GFR, EST NON AFRICAN AMERICAN: 34 mL/min — AB (ref >60)
Glucose, Bld: 109 mg/dL — ABNORMAL HIGH (ref 70–99)
Potassium: 4.6 mmol/L (ref 3.5–5.1)
Sodium: 137 mmol/L (ref 135–145)

## 2018-10-13 NOTE — NC FL2 (Signed)
[place] LEVEL OF CARE SCREENING TOOL     IDENTIFICATION  Patient Name: Wayne [name] Birthdate: 05-26-1926 Sex: male Admission Date (Current Location): 10/11/2018  [facility] [place] and [place] Number:  Herbalist and Address:  [facility], [place]  [address],  City, [place] [address]      Provider Number: [id number]  Attending Physician Name and Address:  Thurnell Lose, MD  Relative Name and Phone Number:  [name] (son)    [phone]     Current Level of Care: Hospital Recommended Level of Care: [place] Prior Approval Number:    Date Approved/Denied: 10/13/18 PASRR Number: [id number]  Discharge Plan: SNF    Current Diagnoses: Patient Active Problem List   Diagnosis Date Noted  . Hip fracture, unspecified laterality, closed, initial encounter ([place]) 10/11/2018  . Hip fracture ([place]) 10/11/2018  . Closed intertrochanteric fracture of left femur ([place]) 10/11/2018  . Viral URI with cough 10/20/2017  . Pneumonia of left lower lobe due to infectious organism ([place]) 10/20/2017  . Renal insufficiency 01/25/2012  . Insomnia 12/21/2011  . Venous insufficiency 08/24/2011  . ALLERGIC RHINITIS 12/10/2008  . DERMATITIS, CONTACT, NOS 08/11/2007  . Short-term memory loss 08/02/2007  . Hypothyroidism 04/07/2007  . Hyperlipidemia 04/07/2007  . Essential hypertension 04/07/2007    Orientation RESPIRATION BLADDER Height & Weight     Self, Time, Situation, Place  Normal Incontinent, External catheter Weight: 66.2 kg Height:  5\' 7"  (170.2 cm)  BEHAVIORAL SYMPTOMS/MOOD NEUROLOGICAL BOWEL NUTRITION STATUS      Continent Diet(see discharge summary)  AMBULATORY STATUS COMMUNICATION OF NEEDS Skin   Extensive Assist Verbally PU Stage and Appropriate Care, Surgical wounds(stage II pressure injury coccyx, left hip closed surgical incision with silver hydrofiber dressing )                       Personal Care Assistance Level  of Assistance  Bathing, Feeding, Dressing, Total care Bathing Assistance: Maximum assistance Feeding assistance: Independent Dressing Assistance: Maximum assistance Total Care Assistance: Maximum assistance   Functional Limitations Info  Sight, Hearing, Speech Sight Info: Adequate Hearing Info: Adequate Speech Info: Adequate    SPECIAL CARE FACTORS FREQUENCY  PT (By licensed PT), OT (By licensed OT)     PT Frequency: min 5x weekly OT Frequency: min 5x weekly            Contractures Contractures Info: Not present    Additional Factors Info  Code Status, Allergies Code Status Info: full Allergies Info: doxazosin mesylate, propoxyphene Hcl, Penicillins           Current Medications (10/13/2018):  This is the current hospital active medication list Current Facility-Administered Medications  Medication Dose Route Frequency Provider Last Rate Last Dose  . acetaminophen (TYLENOL) tablet 650 mg  650 mg Oral Q6H PRN Prudencio Burly III, PA-C      . aspirin EC tablet 81 mg  81 mg Oral Daily Prudencio Burly III, PA-C   81 mg at 10/13/18 0845  . atenolol (TENORMIN) tablet 50 mg  50 mg Oral Daily Prudencio Burly III, PA-C   50 mg at 10/13/18 0845  . bisacodyl (DULCOLAX) suppository 10 mg  10 mg Rectal Daily PRN Prudencio Burly III, PA-C      . docusate sodium (COLACE) capsule 100 mg  100 mg Oral BID Prudencio Burly III, PA-C   100 mg at 10/13/18 0845  . enoxaparin (LOVENOX) injection 30 mg  30 mg Subcutaneous Q24H Prudencio Burly III, PA-C   30 mg at 10/13/18 0845  . haloperidol lactate (HALDOL) injection 2 mg  2 mg Intravenous Q6H PRN Prudencio Burly III, PA-C      . hydrALAZINE (APRESOLINE) injection 10 mg  10 mg Intravenous Q6H PRN Prudencio Burly III, PA-C      . HYDROcodone-acetaminophen (NORCO/VICODIN) 5-325 MG per tablet 1-2 tablet  1-2 tablet Oral Q4H PRN Prudencio Burly III, PA-C   1 tablet at 10/13/18 0845   . Influenza vac split quadrivalent PF (FLUZONE HIGH-DOSE) injection 0.5 mL  0.5 mL Intramuscular Tomorrow-1000 Martensen, Charna Elizabeth III, PA-C      . levothyroxine (SYNTHROID, LEVOTHROID) tablet 50 mcg  50 mcg Oral Q0600 Prudencio Burly III, PA-C   50 mcg at 10/13/18 7893  . LORazepam (ATIVAN) tablet 0.5 mg  0.5 mg Oral QHS Prudencio Burly III, PA-C   0.5 mg at 10/12/18 2210  . metoprolol tartrate (LOPRESSOR) injection 5 mg  5 mg Intravenous Q6H PRN Prudencio Burly III, PA-C      . morphine 2 MG/ML injection 0.25-0.5 mg  0.25-0.5 mg Intravenous Q3H PRN Prudencio Burly III, PA-C      . ondansetron (ZOFRAN) injection 4 mg  4 mg Intravenous Q6H PRN Prudencio Burly III, PA-C      . polyethylene glycol (MIRALAX / GLYCOLAX) packet 17 g  17 g Oral Daily PRN Prudencio Burly III, PA-C      . senna-docusate (Senokot-S) tablet 1 tablet  1 tablet Oral QHS PRN Prudencio Burly III, PA-C      . simvastatin (ZOCOR) tablet 40 mg  40 mg Oral q1800 Prudencio Burly III, PA-C   40 mg at 10/12/18 1750  . sodium phosphate (FLEET) 7-19 GM/118ML enema 1 enema  1 enema Rectal Once PRN Prudencio Burly III, PA-C      . torsemide [facility]) tablet 20 mg  20 mg Oral Daily Prudencio Burly III, PA-C   20 mg at 10/13/18 8101     Discharge Medications: Please see discharge summary for a list of discharge medications.  Relevant Imaging Results:  Relevant Lab Results:   Additional Information SSN: [id number]  Alberteen Sam, LCSW

## 2018-10-13 NOTE — Progress Notes (Signed)
OT Cancellation Note  Patient Details Name: Wayne [name] MRN: [id number] DOB: 1926-04-28   Cancelled Treatment:    Reason Eval/Treat Not Completed: Patient declined, no reason specified(Pt just returned to bed by RN staff, deferred until tomorrow)  Merri Ray Curlee Bogan 10/13/2018, 5:03 PM  Hulda Humphrey OTR/L Acute Rehabilitation Services Pager: [phone] Office: [phone]

## 2018-10-13 NOTE — Clinical Social Work Note (Signed)
Clinical Social Work Assessment  Patient Details  Name: Wayne [name] MRN: [id number] Date of Birth: 27-Oct-1925  Date of referral:  10/13/18               Reason for consult:  Discharge Planning                Permission sought to share information with:  Case Manager, Facility Sport and exercise psychologist, Family Supports Permission granted to share information::  Yes, Verbal Permission Granted  Name::     Darla Lesches::  SNFs  Relationship::  son  Contact Information:  [phone]  Housing/Transportation Living arrangements for the past 2 months:  Single Family Home Source of Information:  Adult Children Patient Interpreter Needed:  None Criminal Activity/Legal Involvement Pertinent to Current Situation/Hospitalization:  No - Comment as needed Significant Relationships:  Adult Children Lives with:  Self Do you feel safe going back to the place where you live?  No Need for family participation in patient care:  Yes (Comment)  Care giving concerns:  CSW received referral for possible SNF placement at time of discharge. Spoke with patient regarding possibility of SNF placement . Patient's family   is currently unable to care for him at their home given patient's current needs and fall risk.  Patient and  Sons at bedside  expressed understanding of PT recommendation and are agreeable to SNF placement at time of discharge. CSW to continue to follow and assist with discharge planning needs.     Social Worker assessment / plan:  Spoke with patient and sons at bedside  concerning possibility of rehab at [facility] before returning home.    Employment status:  Retired Forensic scientist:  Medicare PT Recommendations:  [place] / Referral to community resources:  [place]  Patient/Family's Response to care:  Patient and sons at bedside  recognize need for rehab before returning home and are agreeable to a SNF in [place]. They report preference for   [place], [place], [facility], or [facility]     . CSW explained insurance authorization process. Patient's family reported that they want patient to get stronger to be able to come back home.    Patient/Family's Understanding of and Emotional Response to Diagnosis, Current Treatment, and Prognosis:  Patient/family is realistic regarding therapy needs and expressed being hopeful for SNF placement. Patient expressed understanding of CSW role and discharge process as well as medical condition. No questions/concerns about plan or treatment.    Emotional Assessment Appearance:  Appears stated age Attitude/Demeanor/Rapport:  Gracious Affect (typically observed):  Accepting, Adaptable Orientation:  Oriented to Self, Oriented to Place, Oriented to  Time, Oriented to Situation Alcohol / Substance use:  Not Applicable Psych involvement (Current and /or in the community):  No (Comment)  Discharge Needs  Concerns to be addressed:  Discharge Planning Concerns Readmission within the last 30 days:  No Current discharge risk:  Dependent with Mobility Barriers to Discharge:  Continued Medical Work up   [employer], LCSW 10/13/2018, 12:29 PM

## 2018-10-13 NOTE — Progress Notes (Signed)
    Subjective: Patient reports pain as mild.  Tolerating diet.  No CP, SOB.  Not yet OOB.  Feeling "a whole lot better."  Objective:   VITALS:   Vitals:   10/12/18 1339 10/12/18 1955 10/13/18 0017 10/13/18 0352  BP: 118/79 107/75 (!) 109/55 114/69  Pulse: (!) 55 75 61 79  Resp:  17 20 20   Temp: (!) 97.2 F (36.2 C) 97.8 F (36.6 C) 98 F (36.7 C) 98.1 F (36.7 C)  TempSrc: Oral Oral Oral Oral  SpO2: 98% 96% 94% 95%  Weight:      Height:       CBC Latest Ref Rng & Units 10/13/2018 10/12/2018 10/11/2018  WBC 4.0 - 10.5 K/uL 7.3 7.7 12.8(H)  Hemoglobin 13.0 - 17.0 g/dL 9.7(L) 11.9(L) 14.4  Hematocrit 39.0 - 52.0 % 30.1(L) 36.7(L) 44.6  Platelets 150 - 400 K/uL 180 235 352   BMP Latest Ref Rng & Units 10/13/2018 10/12/2018 10/11/2018  Glucose 70 - 99 mg/dL 109(H) 97 130(H)  BUN 8 - 23 mg/dL 38(H) 44(H) 45(H)  Creatinine 0.61 - 1.24 mg/dL 1.73(H) 1.92(H) 2.11(H)  Sodium 135 - 145 mmol/L 137 138 143  Potassium 3.5 - 5.1 mmol/L 4.6 4.7 4.9  Chloride 98 - 111 mmol/L 107 106 109  CO2 22 - 32 mmol/L 23 24 20(L)  Calcium 8.9 - 10.3 mg/dL 8.3(L) 8.9 9.5   Intake/Output      01/01 0701 - 01/02 0700 01/02 0701 - 01/03 0700   P.O. 120    I.V. (mL/kg) 500 (7.6)    Total Intake(mL/kg) 620 (9.4)    Urine (mL/kg/hr) 725 (0.5)    Blood 50    Total Output 775    Net -155            Physical Exam: General: NAD.  Upright in bed.  Calm, conversant  MSK LLE: Neurovascularly intact Sensation intact distally Feet warm Dorsiflexion/Plantar flexion intact Incision: dressing C/D/I   Assessment: 1 Day Post-Op  S/P Procedure(s) (LRB): INTRAMEDULLARY (IM) NAIL INTERTROCHANTRIC (Left) by Dr. Ernesta Amble. Percell Miller on 10/12/2018  Principal Problem:   Closed intertrochanteric fracture of left femur ([place]) Active Problems:   Hypothyroidism   Hyperlipidemia   Short-term memory loss   Essential hypertension   Venous insufficiency   Hip fracture, unspecified laterality, closed, initial  encounter ([place])   Hip fracture ([place])   Left intertrochanteric femur fracture, status post IM nail Doing well postop day 1 Pain significantly improved postoperatively Not yet up with therapies  Plan: Up with therapy Incentive Spirometry Elevate and Apply ice  Weightbearing: WBAT LLE Insicional and dressing care: Dressings left intact until follow-up Showering: Keep dressing dry VTE prophylaxis: Lovenox, SCDs, ambulation Pain control: Minimize narcotics.  Tylenol.  For breakthrough. Follow - up plan: 2 weeks Contact information:  Edmonia Lynch MD, Roxan Hockey PA-C  Dispo: TBD.  Therapy evaluations pending.  Discharge per medicine when mobilized and ready medically.   Prudencio Burly III, PA-C 10/13/2018, 8:19 AM

## 2018-10-13 NOTE — Evaluation (Signed)
Physical Therapy Evaluation Patient Details Name: Wayne [name] MRN: [id number] DOB: Jun 08, 1926 Today's Date: 10/13/2018   History of Present Illness  Pt is a 85 y.o. male who complains of left hip pain after fall about 10 days ago. Progressive difficulty ambulating and left hip pain.   X-rays showed a left intertrochanteric femur fracture. Wound care was consulted for sacral wound. HISTORY OF multiple back surgeries and peripheral neuropathy ,chronic pitting edema in both feet. S/p IM nail on 10/12/18.  Clinical Impression  The patient is pleasant and participated in mobility. Patient  Assisted to recliner via maxisky for ensured safe transfer as patient has been ex=ssentially bed bound PTA. Pt admitted with above diagnosis. Pt currently with functional limitations due to the deficits listed below (see PT Problem List).  Pt will benefit from skilled PT to increase their independence and safety with mobility to allow discharge to the venue listed below.   Patient's sons present and  Aware of SNF recommendations and agreeable.    Follow Up Recommendations SNF    Equipment Recommendations  None recommended by PT    Recommendations for Other Services       Precautions / Restrictions Precautions Precautions: Fall Restrictions Weight Bearing Restrictions: Yes LLE Weight Bearing: Weight bearing as tolerated      Mobility  Bed Mobility Overal bed mobility: Needs Assistance Bed Mobility: Supine to Sit     Supine to sit: HOB elevated;Max assist     General bed mobility comments: assist with legs and trunk, bed pad used to slide patient around  to sitting at the edge  Transfers                 General transfer comment: patient has not stood for some time PTA, + 2 assist not available so used [facility] for safe transfer.  Ambulation/Gait                Stairs            Wheelchair Mobility    Modified Rankin (Stroke Patients Only)       Balance Overall  balance assessment: Needs assistance;History of Falls Sitting-balance support: Feet supported;No upper extremity supported Sitting balance-Leahy Scale: Good                                       Pertinent Vitals/Pain Pain Assessment: Faces Faces Pain Scale: Hurts little more Pain Location: left hip Pain Descriptors / Indicators: Discomfort;Guarding Pain Intervention(s): Repositioned;Premedicated before session;Monitored during session;Ice applied    Home Living Family/patient expects to be discharged to:: Skilled nursing facility Living Arrangements: Spouse/significant other Available Help at Discharge: Family Type of Home: House         Home Equipment: Gilford Rile - 2 wheels;Wheelchair - manual Additional Comments: has been bed boind since faall 10 days ago except sons transfers to [facility].    Prior Function Level of Independence: Needs assistance   Gait / Transfers Assistance Needed: lives with wife who is unable to assist,            Hand Dominance        Extremity/Trunk Assessment   Upper Extremity Assessment Upper Extremity Assessment: Generalized weakness    Lower Extremity Assessment Lower Extremity Assessment: LLE deficits/detail LLE Deficits / Details: requires assist to move the left leg across the  bed    Cervical / Trunk Assessment Cervical / Trunk Assessment: Kyphotic(severe)  Communication  Cognition Arousal/Alertness: Awake/alert Behavior During Therapy: WFL for tasks assessed/performed Overall Cognitive Status: History of cognitive impairments - at baseline                                 General Comments: ablke to state why he's in hospital.       General Comments      Exercises     Assessment/Plan    PT Assessment Patient needs continued PT services  PT Problem List Decreased strength;Decreased cognition;Decreased activity tolerance;Decreased knowledge of use of DME;Decreased safety awareness;Decreased  mobility;Decreased knowledge of precautions;Decreased skin integrity       PT Treatment Interventions Functional mobility training;Therapeutic activities;Patient/family education;Cognitive remediation;DME instruction;Therapeutic exercise    PT Goals (Current goals can be found in the Care Plan section)  Acute Rehab PT Goals Patient Stated Goal: to walk again PT Goal Formulation: With patient/family Time For Goal Achievement: 10/27/18 Potential to Achieve Goals: Fair    Frequency Min 2X/week   Barriers to discharge Decreased caregiver support      Co-evaluation               AM-PAC PT "6 Clicks" Mobility  Outcome Measure Help needed turning from your back to your side while in a flat bed without using bedrails?: Total Help needed moving from lying on your back to sitting on the side of a flat bed without using bedrails?: Total Help needed moving to and from a bed to a chair (including a wheelchair)?: Total Help needed standing up from a chair using your arms (e.g., wheelchair or bedside chair)?: Total Help needed to walk in hospital room?: Total Help needed climbing 3-5 steps with a railing? : Total 6 Click Score: 6    End of Session Equipment Utilized During Treatment: Gait belt Activity Tolerance: Patient tolerated treatment well Patient left: in chair;with call bell/phone within reach;with chair alarm set;with family/visitor present Nurse Communication: Mobility status;Need for lift equipment(maxisky) PT Visit Diagnosis: Unsteadiness on feet (R26.81)    Time: 1045-1130 PT Time Calculation (min) (ACUTE ONLY): 45 min   Charges:   PT Evaluation $PT Eval Low Complexity: 1 Low PT Treatments $Therapeutic Activity: 23-37 mins        Tresa Endo PT Acute Rehabilitation Services Pager [phone] Office [phone]   Claretha Cooper 10/13/2018, 11:49 AM

## 2018-10-13 NOTE — Progress Notes (Addendum)
PROGRESS NOTE                                                                                                                                                                                                             Patient Demographics:    Wayne [name], is a 85 y.o. male, DOB - 26-Jul-1926, SPQ:[id number]  Admit date - 10/11/2018   Admitting Physician Karmen Bongo, MD  Outpatient Primary MD for the patient is Isaac Bliss, Rayford Halsted, MD  LOS - 2  CC - fall with L.Hip pain     Brief Narrative     Wayne [name]  is a 85 y.o. male, with history of hypertension, dyslipidemia, BPH self caths himself intermittently once or twice a day as needed, chronic urethral scar, early memory loss, chronic back pain status post spinal stimulator placement by Dr. Ellene Route few years ago, neck lower extremity edema.  No history of heart problems per patient.  Patient with above history who lives at home with his wife has limited mobility due to back pain had a mechanical fall when he slipped on hardwood floor about 10 days ago, he is gently starting having pain in his left hip but avoided medical attention finally today went with his son to see orthopedic surgeon Dr. French Ana where he was found to have left hip fracture, we were requested to do direct admission.  Patient besides left hip pain which is sharp worse upon bearing weight better with rest nonradiating, no associated symptoms has no other subjective complaints.   Subjective:   Patient in bed, appears comfortable, denies any headache, no fever, no chest pain or pressure, no shortness of breath , no abdominal pain. No focal weakness.  Mild Postop left hip pain.    Assessment  & Plan :     1. Mechanical Fall with L. Intertrochanteric fracture x 10 days prior to admission - seen by orthopedics status post ORIF on 10/12/2018, alerted the procedure well, minimal perioperative blood loss not requiring any transfusion, resume PT will require  SNF.  On Lovenox for DVT prophylaxis per orthopedics, bearing as tolerated on the left leg per orthopedics.  Likely SNF tomorrow if bed available.    2. HTN - home Tenormin has been ordered along with PRN IV Lopressor and hydralazine.  3.  Dyslipidemia.  Continue home dose statin.  4.  BPH with history of urethral scar.    He has been placed on Flomax, Foley will be discontinued, he at baseline self caths  himself intermittently PRN at home.  5.  Hypothyroidism.  Continue home dose Synthroid.  6.  Chronic back pain.  Has spinal stimulator, PRN pain medications.  7.  Chronic lower extremity edema.  Denies any history of CHF, could have right-sided heart failure or venous stasis.  Continue home dose Demadex.  Monitor electrolytes intermittently.  8. CKD 4.  Last creatinine in our system was 1.5 for many years ago.  Likely new baseline is around 2.  9.  Early dementia.  At risk for delirium.  Patient and family educated, PRN Haldol.  Avoid sedative benzodiazepine and narcotic use.   Family Communication  :  Son and wife  Code Status :  Full  Disposition Plan  :  SNF  Consults  :  Ortho  Procedures  :    ORIF by Dr Alain Marion on  10/12/18  DVT Prophylaxis  :   Lovenox  Lab Results  Component Value Date   PLT 180 10/13/2018    Diet :  Diet Order            DIET SOFT Room service appropriate? Yes; Fluid consistency: Thin  Diet effective now               Inpatient Medications Scheduled Meds: . aspirin EC  81 mg Oral Daily  . atenolol  50 mg Oral Daily  . docusate sodium  100 mg Oral BID  . enoxaparin (LOVENOX) injection  30 mg Subcutaneous Q24H  . Influenza vac split quadrivalent PF  0.5 mL Intramuscular Tomorrow-1000  . levothyroxine  50 mcg Oral Q0600  . LORazepam  0.5 mg Oral QHS  . simvastatin  40 mg Oral q1800  . torsemide  20 mg Oral Daily   Continuous Infusions:  PRN Meds:.acetaminophen **OR** acetaminophen, bisacodyl, haloperidol lactate,  hydrALAZINE, HYDROcodone-acetaminophen, metoCLOPramide **OR** metoCLOPramide (REGLAN) injection, metoprolol tartrate, morphine injection, ondansetron **OR** ondansetron (ZOFRAN) IV, polyethylene glycol, senna-docusate, sodium phosphate  Antibiotics  :   Anti-infectives (From admission, onward)   Start     Dose/Rate Route Frequency Ordered Stop   10/12/18 1500  ceFAZolin (ANCEF) IVPB 2g/100 mL premix     2 g 200 mL/hr over 30 Minutes Intravenous Every 6 hours 10/12/18 1351 10/12/18 2241   10/12/18 0745  ceFAZolin (ANCEF) IVPB 2g/100 mL premix     2 g 200 mL/hr over 30 Minutes Intravenous On call to O.R. 10/12/18 0740 10/12/18 0905   10/12/18 0743  ceFAZolin (ANCEF) 2-4 GM/100ML-% IVPB    Note to Pharmacy:  Maryjean Ka   : cabinet override      10/12/18 0743 10/12/18 0905          Objective:   Vitals:   10/12/18 1339 10/12/18 1955 10/13/18 0017 10/13/18 0352  BP: 118/79 107/75 (!) 109/55 114/69  Pulse: (!) 55 75 61 79  Resp:  17 20 20   Temp: (!) 97.2 F (36.2 C) 97.8 F (36.6 C) 98 F (36.7 C) 98.1 F (36.7 C)  TempSrc: Oral Oral Oral Oral  SpO2: 98% 96% 94% 95%  Weight:      Height:        Wt Readings from Last 3 Encounters:  10/11/18 66.2 kg  10/27/17 67.1 kg  10/20/17 67.1 kg     Intake/Output Summary (Last 24 hours) at 10/13/2018 0914 Last data filed at 10/13/2018 0600 Gross per 24 hour  Intake 620 ml  Output 775 ml  Net -155 ml     Physical Exam  Awake Alert, Oriented X 3,  No new F.N deficits, Normal affect [place].AT,PERRAL Supple Neck,No JVD, No cervical lymphadenopathy appriciated.  Symmetrical Chest wall movement, Good air movement bilaterally, CTAB RRR,No Gallops, Rubs or new Murmurs, No Parasternal Heave +ve B.Sounds, Abd Soft, No tenderness, No organomegaly appriciated, No rebound - guarding or rigidity. Foley in place No Cyanosis, Clubbing or edema, No new Rash or bruise     Data Review:    CBC Recent Labs  Lab 10/11/18 1503 10/12/18 0356  10/13/18 0108  WBC 12.8* 7.7 7.3  HGB 14.4 11.9* 9.7*  HCT 44.6 36.7* 30.1*  PLT 352 235 180  MCV 92.9 92.2 93.5  MCH 30.0 29.9 30.1  MCHC 32.3 32.4 32.2  RDW 13.5 13.5 13.6    Chemistries  Recent Labs  Lab 10/11/18 1503 10/12/18 0356 10/13/18 0108  NA 143 138 137  K 4.9 4.7 4.6  CL 109 106 107  CO2 20* 24 23  GLUCOSE 130* 97 109*  BUN 45* 44* 38*  CREATININE 2.11* 1.92* 1.73*  CALCIUM 9.5 8.9 8.3*   ------------------------------------------------------------------------------------------------------------------ No results for input(s): CHOL, HDL, LDLCALC, TRIG, CHOLHDL, LDLDIRECT in the last 72 hours.  No results found for: HGBA1C ------------------------------------------------------------------------------------------------------------------ No results for input(s): TSH, T4TOTAL, T3FREE, THYROIDAB in the last 72 hours.  Invalid input(s): FREET3 ------------------------------------------------------------------------------------------------------------------ No results for input(s): VITAMINB12, FOLATE, FERRITIN, TIBC, IRON, RETICCTPCT in the last 72 hours.  Coagulation profile Recent Labs  Lab 10/11/18 1503  INR 1.08    No results for input(s): DDIMER in the last 72 hours.  Cardiac Enzymes No results for input(s): CKMB, TROPONINI, MYOGLOBIN in the last 168 hours.  Invalid input(s): CK ------------------------------------------------------------------------------------------------------------------ No results found for: BNP  Micro Results Recent Results (from the past 240 hour(s))  Surgical pcr screen     Status: None   Collection Time: 10/11/18 10:52 PM  Result Value Ref Range Status   MRSA, PCR NEGATIVE NEGATIVE Final   Staphylococcus aureus NEGATIVE NEGATIVE Final    Comment: (NOTE) The Xpert SA Assay (FDA approved for NASAL specimens in patients 57 years of age and older), is one component of a comprehensive surveillance program. It is not  intended to diagnose infection nor to guide or monitor treatment. Performed at [facility], [place] [address], [place], [place] [address]     Radiology Reports Dg Chest Port 1 View  Result Date: 10/11/2018 CLINICAL DATA:  Preop for surgery to repair fractured hip EXAM: PORTABLE CHEST 1 VIEW COMPARISON:  Chest x-ray of 02/15/2012 FINDINGS: No active infiltrate or effusion is seen. Skin folds overlie both lung fields. Mediastinal and hilar contours are unremarkable and mild cardiomegaly is stable. Ectasia of the descending thoracic aorta appears stable. Neurostimulator electrode leads overlie the T7-8 level. A lower anterior cervical spine fusion plate is present. IMPRESSION: No active lung disease.  Stable mild cardiomegaly. Electronically Signed   By: Ivar Drape M.D.   On: 10/11/2018 15:07   Dg C-arm 1-60 Min  Result Date: 10/12/2018 CLINICAL DATA:  Surgery. EXAM: LEFT FEMUR 2 VIEWS; DG C-ARM 61-120 MIN COMPARISON:  None. FLUOROSCOPY TIME:  C-arm fluoroscopic images were obtained intraoperatively and submitted for post operative interpretation. Please see the performing provider's procedural report for the fluoroscopy time utilized. FINDINGS: Three intraoperative fluoroscopic images of the left femur are provided. There is an intertrochanteric femur fracture with displaced lesser trochanter fragment. An intramedullary nail is in place, and there is a hip screw which terminates in the left femoral head. IMPRESSION: Intraoperative images during ORIF of intertrochanteric left femur  fracture. Electronically Signed   By: Logan Bores M.D.   On: 10/12/2018 11:33   Dg Femur Min 2 Views Left  Result Date: 10/12/2018 CLINICAL DATA:  Surgery. EXAM: LEFT FEMUR 2 VIEWS; DG C-ARM 61-120 MIN COMPARISON:  None. FLUOROSCOPY TIME:  C-arm fluoroscopic images were obtained intraoperatively and submitted for post operative interpretation. Please see the performing provider's procedural report for the fluoroscopy  time utilized. FINDINGS: Three intraoperative fluoroscopic images of the left femur are provided. There is an intertrochanteric femur fracture with displaced lesser trochanter fragment. An intramedullary nail is in place, and there is a hip screw which terminates in the left femoral head. IMPRESSION: Intraoperative images during ORIF of intertrochanteric left femur fracture. Electronically Signed   By: Logan Bores M.D.   On: 10/12/2018 11:33    Time Spent in minutes  30   Lala Lund M.D on 10/13/2018 at 9:14 AM  To page go to [email or web address] - password [facility]

## 2018-10-13 NOTE — Consult Note (Signed)
[facility] Nurse wound consult note Patient receiving care in [place].  Son and spouse present. Reason for Consult: "wound to sacrum" Wound types: The lower back along the spine has a non-blanchable area that measures 0.6 cm x 0.6 cm.  The patient states it is slightly tender.  His son states it is where the patient has had a "stimulator" inserted.  The care for this area will be a small foam pad and an air mattress. The buttock area is darker in color and consistent with Chronic Tissue Injury (CTI) from sitting for long periods of time, as well as effects of urinary incontinence (currently controlled by a condom catheter), and fecal incontinence.  To the patient's right of the gluteal fold there are three distinct areas.  The most caudal area is maroon in color and measures 1 cm x 1 cm.  The overlying tissue is intact. This is consistent with a DTPI.  Just below this is an area that measures 0.5 cm x 1.5 cm.  The wound base is 100% pink.  This area is consistent with MASD.  And below this is the third area that measures 3 cm x 2 cm, is 100% pink, and is also consistent with MASD.  To the patient's left of the gluteal fold is an area consistent with MASD that measures 3 cm x 1 cm and is 100% pink. Dressing procedure/placement/frequency: A foam dressing to the spinal stimulator area, change every 3 - 5 days. A low air loss mattress. AVOID using a sacral foam dressing.  Apply [employer] clear to the wounds on the buttocks each shift. Use ONLY ONE DermaTherapy pad beneath patient's buttocks. Do NOT use disposable pads. Only use one underpad (DermaTherapy or DryFlow) beneath a patient if they are on a mattress replacement with low air loss feature. And, turn the patient every two hours. Monitor the wound area(s) for worsening of condition such as: Signs/symptoms of infection,  Increase in size,  Development of or worsening of odor, Development of pain, or increased pain at the affected locations.  Notify the  medical team if any of these develop.  Thank you for the consult.  Discussed plan of care with the patient and bedside nurse.  [place] nurse will not follow at this time.  Please re-consult the [facility] team if needed.  Val Riles, RN, MSN, CWOCN, CNS-BC, pager [phone]

## 2018-10-14 ENCOUNTER — Encounter (HOSPITAL_COMMUNITY): Payer: Self-pay

## 2018-10-14 LAB — CBC
HCT: 31.5 % — ABNORMAL LOW (ref 39.0–52.0)
HEMOGLOBIN: 10.3 g/dL — AB (ref 13.0–17.0)
MCH: 30.6 pg (ref 26.0–34.0)
MCHC: 32.7 g/dL (ref 30.0–36.0)
MCV: 93.5 fL (ref 80.0–100.0)
NRBC: 0 % (ref 0.0–0.2)
Platelets: 211 10*3/uL (ref 150–400)
RBC: 3.37 MIL/uL — ABNORMAL LOW (ref 4.22–5.81)
RDW: 13.8 % (ref 11.5–15.5)
WBC: 9.5 10*3/uL (ref 4.0–10.5)

## 2018-10-14 LAB — BASIC METABOLIC PANEL
ANION GAP: 9 (ref 5–15)
BUN: 38 mg/dL — ABNORMAL HIGH (ref 8–23)
CO2: 24 mmol/L (ref 22–32)
Calcium: 8.5 mg/dL — ABNORMAL LOW (ref 8.9–10.3)
Chloride: 104 mmol/L (ref 98–111)
Creatinine, Ser: 1.75 mg/dL — ABNORMAL HIGH (ref 0.61–1.24)
GFR calc Af Amer: 39 mL/min — ABNORMAL LOW (ref >60)
GFR calc non Af Amer: 34 mL/min — ABNORMAL LOW (ref >60)
Glucose, Bld: 93 mg/dL (ref 70–99)
Potassium: 4.6 mmol/L (ref 3.5–5.1)
Sodium: 137 mmol/L (ref 135–145)

## 2018-10-14 NOTE — Progress Notes (Addendum)
PROGRESS NOTE    Wayne [name]  KWI:[id number] DOB: 23-Nov-1925 DOA: 10/11/2018 PCP: Isaac Bliss, Rayford Halsted, MD   Brief Narrative: Patient is a 85 year old male with past medical history of hypertension, hyperlipidemia, BPH on intermittent self-catheterization, chronic urethral scar, dementia, chronic back pain status post spinal stimulator placement, bilateral lower extremity edema who presented with mechanical fall from home.  He was found to have left intertrochanteric femur fracture.  Underwent intramedullary nailing.  Postop day 2.  Plan is to discharge him to skilled nursing facility tomorrow.  Assessment & Plan:   Principal Problem:   Closed intertrochanteric fracture of left femur (HCC) Active Problems:   Hypothyroidism   Hyperlipidemia   Short-term memory loss   Essential hypertension   Venous insufficiency   Hip fracture, unspecified laterality, closed, initial encounter ([place])   Hip fracture ([place])  Left intertrochanteric fracture: Mechanical fall about 10 days before the admission date.  Underwent intramedullary nailing on 10/12/2018. Continue pain management.  PT/OT following.  Recommend skilled facility.  Continue Lovenox for DVT prophylaxis.  Hypertension: Continue current medication regimen.  Currently blood pressure stable.  Hyperlipidemia: Continue home dose statin.  BPH with history of urethral scar: On Flomax.  At baseline he self catheterizes himself intermittently.  Hypothyroidism: Continue Synthyroid  Chronic back pain: Has spinal stimulator.  Continue PRN pain medications.  Bilateral lower extremity edema: No history of CHF.  Chronic.  Continue home dose Demadex.  I recommended elevation of his bilateral lower extremities while he is in supine position, application of compression stockings.  CKD stage IV: Currently kidney function is on baseline.  Early dementia: Continue supportive care.  Avoid benzos and too much narcotics.         DVT  prophylaxis: Lovenox Code Status: Full Family Communication: Family member present at the bedside Disposition Plan: Skilled nursing facility tomorrow   Consultants: Orthopedics  Procedures: Intramedullary nailing Antimicrobials: None  Subjective:  Patient seen and examined the bedside this morning.  Remains hemodynamically stable.  Pain well controlled.  Still has significant bilateral lower extremity edema. Objective: Vitals:   10/13/18 0017 10/13/18 0352 10/13/18 1406 10/13/18 2002  BP: (!) 109/55 114/69 106/66 (!) 99/55  Pulse: 61 79 72   Resp: 20 20 16 16   Temp: 98 F (36.7 C) 98.1 F (36.7 C) (!) 97.5 F (36.4 C) 99 F (37.2 C)  TempSrc: Oral Oral Oral Oral  SpO2: 94% 95% 93%   Weight:      Height:        Intake/Output Summary (Last 24 hours) at 10/14/2018 1046 Last data filed at 10/14/2018 0900 Gross per 24 hour  Intake 490 ml  Output 650 ml  Net -160 ml   Filed Weights   10/11/18 1541  Weight: 66.2 kg    Examination:  General exam: Appears calm and comfortable ,Not in distress,average built HEENT:PERRL,Oral mucosa moist, Ear/Nose normal on gross exam Respiratory system: Bilateral equal air entry, normal vesicular breath sounds, no wheezes or crackles  Cardiovascular system: S1 & S2 heard, RRR. No JVD, murmurs, rubs, gallops or clicks. No pedal edema. Gastrointestinal system: Abdomen is nondistended, soft and nontender. No organomegaly or masses felt. Normal bowel sounds heard. Central nervous system: Alert and oriented. No focal neurological deficits. Extremities: Severe edema bilateral lower extremities, clean surgical wound on the left hip with appropriate tenderness. Skin: No rashes, lesions or ulcers,no icterus ,no pallor MSK: Normal muscle bulk,tone ,power Psychiatry: Judgement and insight appear normal. Mood & affect appropriate.  Data Reviewed: I have personally reviewed following labs and imaging studies  CBC: Recent Labs  Lab 10/11/18 1503  10/12/18 0356 10/13/18 0108 10/14/18 0220  WBC 12.8* 7.7 7.3 9.5  HGB 14.4 11.9* 9.7* 10.3*  HCT 44.6 36.7* 30.1* 31.5*  MCV 92.9 92.2 93.5 93.5  PLT 352 235 180 100   Basic Metabolic Panel: Recent Labs  Lab 10/11/18 1503 10/12/18 0356 10/13/18 0108 10/14/18 0220  NA 143 138 137 137  K 4.9 4.7 4.6 4.6  CL 109 106 107 104  CO2 20* 24 23 24   GLUCOSE 130* 97 109* 93  BUN 45* 44* 38* 38*  CREATININE 2.11* 1.92* 1.73* 1.75*  CALCIUM 9.5 8.9 8.3* 8.5*   GFR: Estimated Creatinine Clearance: 26.2 mL/min (A) (by C-G formula based on SCr of 1.75 mg/dL (H)). Liver Function Tests: No results for input(s): AST, ALT, ALKPHOS, BILITOT, PROT, ALBUMIN in the last 168 hours. No results for input(s): LIPASE, AMYLASE in the last 168 hours. No results for input(s): AMMONIA in the last 168 hours. Coagulation Profile: Recent Labs  Lab 10/11/18 1503  INR 1.08   Cardiac Enzymes: No results for input(s): CKTOTAL, CKMB, CKMBINDEX, TROPONINI in the last 168 hours. BNP (last 3 results) No results for input(s): PROBNP in the last 8760 hours. HbA1C: No results for input(s): HGBA1C in the last 72 hours. CBG: No results for input(s): GLUCAP in the last 168 hours. Lipid Profile: No results for input(s): CHOL, HDL, LDLCALC, TRIG, CHOLHDL, LDLDIRECT in the last 72 hours. Thyroid Function Tests: No results for input(s): TSH, T4TOTAL, FREET4, T3FREE, THYROIDAB in the last 72 hours. Anemia Panel: No results for input(s): VITAMINB12, FOLATE, FERRITIN, TIBC, IRON, RETICCTPCT in the last 72 hours. Sepsis Labs: No results for input(s): PROCALCITON, LATICACIDVEN in the last 168 hours.  Recent Results (from the past 240 hour(s))  Surgical pcr screen     Status: None   Collection Time: 10/11/18 10:52 PM  Result Value Ref Range Status   MRSA, PCR NEGATIVE NEGATIVE Final   Staphylococcus aureus NEGATIVE NEGATIVE Final    Comment: (NOTE) The Xpert SA Assay (FDA approved for NASAL specimens in patients  18 years of age and older), is one component of a comprehensive surveillance program. It is not intended to diagnose infection nor to guide or monitor treatment. Performed at [facility], [place] [address] [place],  [address]          Radiology Studies: No results found.      Scheduled Meds: . aspirin EC  81 mg Oral Daily  . atenolol  50 mg Oral Daily  . docusate sodium  100 mg Oral BID  . enoxaparin (LOVENOX) injection  30 mg Subcutaneous Q24H  . Influenza vac split quadrivalent PF  0.5 mL Intramuscular Tomorrow-1000  . levothyroxine  50 mcg Oral Q0600  . LORazepam  0.5 mg Oral QHS  . simvastatin  40 mg Oral q1800  . torsemide  20 mg Oral Daily   Continuous Infusions:   LOS: 3 days    Time spent: 35 mins.More than 50% of that time was spent in counseling and/or coordination of care.      Shelly Coss, MD [facility] Pager [phone]  If 7PM-7AM, please contact night-coverage [email or web address] Password [facility] 10/14/2018, 10:46 AM

## 2018-10-14 NOTE — Progress Notes (Signed)
Physical Therapy Treatment Patient Details Name: Wayne [name] MRN: [id number] DOB: 12-21-1925 Today's Date: 10/14/2018    History of Present Illness Pt is a 85 y.o. male who complains of left hip pain after fall about 10 days ago. Progressive difficulty ambulating and left hip pain.   X-rays showed a left intertrochanteric femur fracture. Wound care was consulted for sacral wound. HISTORY OF multiple back surgeries and peripheral neuropathy ,chronic pitting edema in both feet. S/p IM nail on 10/12/18.    PT Comments    PAtient progressing this session to standing transfer with use of Stedy.  Able to pivot to sit EOB, with severely curved spinal posture and limited standing tolerance.  Will benefit from SNF level rehab upon d/c.    Follow Up Recommendations  SNF     Equipment Recommendations  None recommended by PT    Recommendations for Other Services       Precautions / Restrictions Precautions Precautions: Fall Restrictions Weight Bearing Restrictions: Yes LLE Weight Bearing: Weight bearing as tolerated    Mobility  Bed Mobility Overal bed mobility: Needs Assistance Bed Mobility: Supine to Sit     Supine to sit: Mod assist;+2 for physical assistance;+2 for safety/equipment;HOB elevated     General bed mobility comments: Patient able to scoot hips lateral in bed with assist for R LE and lifting assist for trunk  Transfers Overall transfer level: Needs assistance   Transfers: Sit to/from Stand Sit to Stand: Max assist;+2 physical assistance;+2 safety/equipment;From elevated surface         General transfer comment: heavy lifting help to stand, then bring hips enough forward to get seat down on stedy, then used stedy to pivot to recliner  Ambulation/Gait                 Stairs             Wheelchair Mobility    Modified Rankin (Stroke Patients Only)       Balance Overall balance assessment: Needs assistance;History of Falls Sitting-balance  support: Feet supported;No upper extremity supported Sitting balance-Leahy Scale: Good     Standing balance support: Bilateral upper extremity supported Standing balance-Leahy Scale: Poor Standing balance comment: dependent on therapist assist                             Cognition Arousal/Alertness: Awake/alert Behavior During Therapy: WFL for tasks assessed/performed Overall Cognitive Status: Within Functional Limits for tasks assessed                                 General Comments: oriented x3, able to follow directions throughout session      Exercises General Exercises - Lower Extremity Ankle Circles/Pumps: AROM;Both;5 reps;Supine Quad Sets: AROM;Left;5 reps;Supine Heel Slides: AAROM;Left;5 reps;Supine    General Comments        Pertinent Vitals/Pain Pain Assessment: 0-10 Pain Score: 5  Pain Location: left hip Pain Descriptors / Indicators: Discomfort;Guarding Pain Intervention(s): Monitored during session;Repositioned    Home Living Family/patient expects to be discharged to:: Skilled nursing facility Living Arrangements: Spouse/significant other Available Help at Discharge: Family Type of Home: House       Home Equipment: Gilford Rile - 2 wheels;Wheelchair - manual Additional Comments: has been bed bound since fall, typically operates with a SPC prior to fall.     Prior Function Level of Independence: Needs assistance  Gait / Transfers  Assistance Needed: lives with wife who is unable to assist,  ADL's / Homemaking Assistance Needed: Prior to fall, independent     PT Goals (current goals can now be found in the care plan section) Acute Rehab PT Goals Patient Stated Goal: to walk again Progress towards PT goals: Progressing toward goals    Frequency    Min 2X/week      PT Plan Current plan remains appropriate    Co-evaluation              AM-PAC PT "6 Clicks" Mobility   Outcome Measure  Help needed turning from your  back to your side while in a flat bed without using bedrails?: Total Help needed moving from lying on your back to sitting on the side of a flat bed without using bedrails?: Total Help needed moving to and from a bed to a chair (including a wheelchair)?: Total Help needed standing up from a chair using your arms (e.g., wheelchair or bedside chair)?: Total Help needed to walk in hospital room?: Total Help needed climbing 3-5 steps with a railing? : Total 6 Click Score: 6    End of Session Equipment Utilized During Treatment: Gait belt Activity Tolerance: Patient tolerated treatment well Patient left: in chair;with chair alarm set;with call bell/phone within reach Nurse Communication: Mobility status;Need for lift equipment PT Visit Diagnosis: Unsteadiness on feet (R26.81);Other abnormalities of gait and mobility (R26.89);Muscle weakness (generalized) (M62.81)     Time: 8786-7672 PT Time Calculation (min) (ACUTE ONLY): 26 min  Charges:  $Therapeutic Activity: 8-22 mins                     Wayne [name], PT Acute Rehabilitation Services [phone] 10/14/2018    Wayne [name] 10/14/2018, 1:03 PM

## 2018-10-14 NOTE — Care Management Important Message (Signed)
Important Message  Patient Details  Name: Wayne [name] MRN: [id number] Date of Birth: [birth year]   Medicare Important Message Given:  Yes    [name] 10/14/2018, 3:17 PM

## 2018-10-14 NOTE — Progress Notes (Signed)
    Subjective: Patient reports pain as mild.  Tolerating diet.  No CP, SOB.  OOB to chair.    Objective:   VITALS:   Vitals:   10/13/18 0017 10/13/18 0352 10/13/18 1406 10/13/18 2002  BP: (!) 109/55 114/69 106/66 (!) 99/55  Pulse: 61 79 72   Resp: 20 20 16 16   Temp: 98 F (36.7 C) 98.1 F (36.7 C) (!) 97.5 F (36.4 C) 99 F (37.2 C)  TempSrc: Oral Oral Oral Oral  SpO2: 94% 95% 93%   Weight:      Height:       CBC Latest Ref Rng & Units 10/14/2018 10/13/2018 10/12/2018  WBC 4.0 - 10.5 K/uL 9.5 7.3 7.7  Hemoglobin 13.0 - 17.0 g/dL 10.3(L) 9.7(L) 11.9(L)  Hematocrit 39.0 - 52.0 % 31.5(L) 30.1(L) 36.7(L)  Platelets 150 - 400 K/uL 211 180 235   BMP Latest Ref Rng & Units 10/14/2018 10/13/2018 10/12/2018  Glucose 70 - 99 mg/dL 93 109(H) 97  BUN 8 - 23 mg/dL 38(H) 38(H) 44(H)  Creatinine 0.61 - 1.24 mg/dL 1.75(H) 1.73(H) 1.92(H)  Sodium 135 - 145 mmol/L 137 137 138  Potassium 3.5 - 5.1 mmol/L 4.6 4.6 4.7  Chloride 98 - 111 mmol/L 104 107 106  CO2 22 - 32 mmol/L 24 23 24   Calcium 8.9 - 10.3 mg/dL 8.5(L) 8.3(L) 8.9   Intake/Output      01/02 0701 - 01/03 0700 01/03 0701 - 01/04 0700   P.O. 240    I.V. (mL/kg)     Total Intake(mL/kg) 240 (3.6)    Urine (mL/kg/hr) 650 (0.4)    Blood     Total Output 650    Net -410            Physical Exam: General: NAD.  Upright in bed.  Calm, conversant  MSK LLE: Neurovascularly intact Sensation intact distally Feet warm Dorsiflexion/Plantar flexion intact Incision: dressing C/D/I   Assessment: 2 Days Post-Op  S/P Procedure(s) (LRB): INTRAMEDULLARY (IM) NAIL INTERTROCHANTRIC (Left) by Dr. Ernesta Amble. Percell Miller on 10/12/2018  Principal Problem:   Closed intertrochanteric fracture of left femur ([place]) Active Problems:   Hypothyroidism   Hyperlipidemia   Short-term memory loss   Essential hypertension   Venous insufficiency   Hip fracture, unspecified laterality, closed, initial encounter ([place])   Hip fracture ([place])   Left  intertrochanteric femur fracture, status post IM nail Doing well postop day 2 Pain controlled Early mobilization to chair so far  Plan: Up with therapy Incentive Spirometry Apply ice prn  Weightbearing: WBAT LLE Insicional and dressing care: Dressings left intact until follow-up Showering: Keep dressing dry VTE prophylaxis: Lovenox, SCDs, ambulation Pain control: Minimize narcotics.  Tylenol.  Follow - up plan: 2 weeks Contact information:  Edmonia Lynch MD, Roxan Hockey PA-C  Dispo:  SNF planning in progress.  Discharge per medicine when mobilized and ready medically.  Follow up in the office with Dr. Alain Marion in 2 weeks.     Wayne Elizabeth Martensen III, PA-C 10/14/2018, 8:02 AM

## 2018-10-14 NOTE — Plan of Care (Signed)

## 2018-10-14 NOTE — Evaluation (Signed)
Occupational Therapy Evaluation Patient Details Name: Wayne [name] MRN: [id number] DOB: Oct 27, 1925 Today's Date: 10/14/2018    History of Present Illness Pt is a 85 y.o. male who complains of left hip pain after fall about 10 days ago. Progressive difficulty ambulating and left hip pain.   X-rays showed a left intertrochanteric femur fracture. Wound care was consulted for sacral wound. HISTORY OF multiple back surgeries and peripheral neuropathy ,chronic pitting edema in both feet. S/p IM nail on 10/12/18.   Clinical Impression   PTA (the fall) Pt was mod I at home with [facility] and shower chair, lived at home with wife who has early stages of dementia (per chart review). Pt is currently max A +2 assist for sit <>stand and use of Stedy. Pt is max to total A for LB ADL, set up for grooming/feeding, and mod A for UB bathing/dressing. Pt will benefit from skilled OT in the acute setting and afterwards at the SNF level. Next session focus on AE for LB ADL and transfers.     Follow Up Recommendations  SNF;Supervision/Assistance - 24 hour    Equipment Recommendations  Other (comment)(defer to next venue)    Recommendations for Other Services       Precautions / Restrictions Precautions Precautions: Fall Restrictions Weight Bearing Restrictions: Yes LLE Weight Bearing: Weight bearing as tolerated      Mobility Bed Mobility Overal bed mobility: Needs Assistance Bed Mobility: Supine to Sit     Supine to sit: Mod assist;+2 for physical assistance;+2 for safety/equipment;HOB elevated     General bed mobility comments: assist with legs and trunk, bed pad used to slide patient around  to sitting at the edge  Transfers Overall transfer level: Needs assistance   Transfers: Sit to/from Stand Sit to Stand: Max assist;+2 physical assistance;+2 safety/equipment;From elevated surface         General transfer comment: +2 max A with use of bed pad to boost into standing for use of bari stedy. vc  for sequencing.    Balance Overall balance assessment: Needs assistance;History of Falls Sitting-balance support: Feet supported;No upper extremity supported Sitting balance-Leahy Scale: Good     Standing balance support: Bilateral upper extremity supported Standing balance-Leahy Scale: Poor Standing balance comment: dependent on therapist assist                            ADL either performed or assessed with clinical judgement   ADL Overall ADL's : Needs assistance/impaired Eating/Feeding: Set up;Sitting Eating/Feeding Details (indicate cue type and reason): in recliner eating lunch Grooming: Set up;Sitting   Upper Body Bathing: Maximal assistance   Lower Body Bathing: Total assistance   Upper Body Dressing : Moderate assistance   Lower Body Dressing: Total assistance   Toilet Transfer: Maximal assistance;+2 for physical assistance;+2 for safety/equipment Toilet Transfer Details (indicate cue type and reason): use of Stedy Toileting- Clothing Manipulation and Hygiene: Total assistance         General ADL Comments: Pt limited by pain/decreased ROM for access to LB for ADL     Vision Patient Visual Report: No change from baseline       Perception     Praxis      Pertinent Vitals/Pain Pain Assessment: 0-10 Pain Score: 7  Pain Location: left hip Pain Descriptors / Indicators: Discomfort;Guarding Pain Intervention(s): Limited activity within patient's tolerance;Monitored during session;Repositioned(declined ice)     Hand Dominance     Extremity/Trunk Assessment Upper Extremity Assessment  Upper Extremity Assessment: Generalized weakness(grasp grossly 4/5)   Lower Extremity Assessment Lower Extremity Assessment: Defer to PT evaluation   Cervical / Trunk Assessment Cervical / Trunk Assessment: Kyphotic   Communication Communication Communication: No difficulties   Cognition Arousal/Alertness: Awake/alert Behavior During Therapy: WFL for tasks  assessed/performed Overall Cognitive Status: History of cognitive impairments - at baseline                                 General Comments: oriented x3, able to follow directions throughout session   General Comments       Exercises     Shoulder Instructions      Home Living Family/patient expects to be discharged to:: Skilled nursing facility Living Arrangements: Spouse/significant other Available Help at Discharge: Family Type of Home: House                       Home Equipment: Gilford Rile - 2 wheels;Wheelchair - manual   Additional Comments: has been bed bound since fall, typically operates with a SPC prior to fall.       Prior Functioning/Environment Level of Independence: Needs assistance  Gait / Transfers Assistance Needed: lives with wife who is unable to assist,  ADL's / Homemaking Assistance Needed: Prior to fall, independent            OT Problem List: Decreased strength;Decreased range of motion;Decreased activity tolerance;Impaired balance (sitting and/or standing);Decreased knowledge of use of DME or AE;Pain;Increased edema      OT Treatment/Interventions: Self-care/ADL training;Therapeutic exercise;DME and/or AE instruction;Therapeutic activities;Patient/family education;Balance training    OT Goals(Current goals can be found in the care plan section) Acute Rehab OT Goals Patient Stated Goal: to walk again OT Goal Formulation: With patient Time For Goal Achievement: 10/28/18 Potential to Achieve Goals: Good ADL Goals Pt Will Perform Grooming: with modified independence;sitting Pt Will Perform Upper Body Bathing: with modified independence;sitting Pt Will Perform Lower Body Bathing: with min guard assist;with adaptive equipment;sitting/lateral leans Pt Will Transfer to Toilet: with min assist;with +2 assist;stand pivot transfer Pt Will Perform Toileting - Clothing Manipulation and hygiene: with supervision;sitting/lateral  leans Additional ADL Goal #1: Pt will perform bed mobility at min A level prior to engaging in ADL activity  OT Frequency: Min 2X/week   Barriers to D/C: Decreased caregiver support  wife is unable to assist        Co-evaluation              AM-PAC OT "6 Clicks" Daily Activity     Outcome Measure Help from another person eating meals?: A Little Help from another person taking care of personal grooming?: A Little Help from another person toileting, which includes using toliet, bedpan, or urinal?: A Lot Help from another person bathing (including washing, rinsing, drying)?: A Lot Help from another person to put on and taking off regular upper body clothing?: A Lot Help from another person to put on and taking off regular lower body clothing?: Total 6 Click Score: 13   End of Session Equipment Utilized During Treatment: Gait belt;Other (comment)(Stedy) Nurse Communication: Mobility status;Need for lift equipment  Activity Tolerance: Patient tolerated treatment well Patient left: in chair;with call bell/phone within reach;with family/visitor present;with chair alarm set;Other (comment)(extra pillows on chair for sacral wound, lift pad under Pt)  OT Visit Diagnosis: Unsteadiness on feet (R26.81);Other abnormalities of gait and mobility (R26.89);History of falling (Z91.81);Muscle weakness (generalized) (M62.81);Pain Pain - Right/Left:  Left Pain - part of body: Leg                Time: 1124-1150 OT Time Calculation (min): 26 min Charges:  OT General Charges $OT Visit: 1 Visit OT Evaluation $OT Eval Moderate Complexity: [place] OTR/L Acute Rehabilitation Services Pager: [phone] Office: [place] 10/14/2018, 12:17 PM

## 2018-10-15 DIAGNOSIS — F0390 Unspecified dementia without behavioral disturbance: Secondary | ICD-10-CM | POA: Diagnosis not present

## 2018-10-15 DIAGNOSIS — M80052D Age-related osteoporosis with current pathological fracture, left femur, subsequent encounter for fracture with routine healing: Secondary | ICD-10-CM | POA: Diagnosis not present

## 2018-10-15 DIAGNOSIS — I10 Essential (primary) hypertension: Secondary | ICD-10-CM | POA: Diagnosis not present

## 2018-10-15 DIAGNOSIS — M545 Low back pain: Secondary | ICD-10-CM | POA: Diagnosis not present

## 2018-10-15 DIAGNOSIS — E785 Hyperlipidemia, unspecified: Secondary | ICD-10-CM | POA: Diagnosis not present

## 2018-10-15 DIAGNOSIS — N289 Disorder of kidney and ureter, unspecified: Secondary | ICD-10-CM | POA: Diagnosis not present

## 2018-10-15 DIAGNOSIS — N184 Chronic kidney disease, stage 4 (severe): Secondary | ICD-10-CM | POA: Diagnosis not present

## 2018-10-15 DIAGNOSIS — R54 Age-related physical debility: Secondary | ICD-10-CM | POA: Diagnosis not present

## 2018-10-15 DIAGNOSIS — S31809A Unspecified open wound of unspecified buttock, initial encounter: Secondary | ICD-10-CM | POA: Diagnosis not present

## 2018-10-15 DIAGNOSIS — E440 Moderate protein-calorie malnutrition: Secondary | ICD-10-CM | POA: Diagnosis not present

## 2018-10-15 DIAGNOSIS — J209 Acute bronchitis, unspecified: Secondary | ICD-10-CM | POA: Diagnosis not present

## 2018-10-15 DIAGNOSIS — J069 Acute upper respiratory infection, unspecified: Secondary | ICD-10-CM | POA: Diagnosis not present

## 2018-10-15 DIAGNOSIS — L602 Onychogryphosis: Secondary | ICD-10-CM | POA: Diagnosis not present

## 2018-10-15 DIAGNOSIS — N400 Enlarged prostate without lower urinary tract symptoms: Secondary | ICD-10-CM | POA: Diagnosis not present

## 2018-10-15 DIAGNOSIS — R600 Localized edema: Secondary | ICD-10-CM | POA: Diagnosis not present

## 2018-10-15 DIAGNOSIS — S72142G Displaced intertrochanteric fracture of left femur, subsequent encounter for closed fracture with delayed healing: Secondary | ICD-10-CM | POA: Diagnosis not present

## 2018-10-15 DIAGNOSIS — R413 Other amnesia: Secondary | ICD-10-CM | POA: Diagnosis not present

## 2018-10-15 DIAGNOSIS — Z111 Encounter for screening for respiratory tuberculosis: Secondary | ICD-10-CM | POA: Diagnosis not present

## 2018-10-15 DIAGNOSIS — S72145S Nondisplaced intertrochanteric fracture of left femur, sequela: Secondary | ICD-10-CM | POA: Diagnosis not present

## 2018-10-15 DIAGNOSIS — R05 Cough: Secondary | ICD-10-CM | POA: Diagnosis not present

## 2018-10-15 DIAGNOSIS — M25531 Pain in right wrist: Secondary | ICD-10-CM | POA: Diagnosis not present

## 2018-10-15 DIAGNOSIS — M6281 Muscle weakness (generalized): Secondary | ICD-10-CM | POA: Diagnosis not present

## 2018-10-15 DIAGNOSIS — Z7401 Bed confinement status: Secondary | ICD-10-CM | POA: Diagnosis not present

## 2018-10-15 DIAGNOSIS — S72142D Displaced intertrochanteric fracture of left femur, subsequent encounter for closed fracture with routine healing: Secondary | ICD-10-CM | POA: Diagnosis not present

## 2018-10-15 DIAGNOSIS — K5900 Constipation, unspecified: Secondary | ICD-10-CM | POA: Diagnosis not present

## 2018-10-15 DIAGNOSIS — Z9181 History of falling: Secondary | ICD-10-CM | POA: Diagnosis not present

## 2018-10-15 DIAGNOSIS — M2550 Pain in unspecified joint: Secondary | ICD-10-CM | POA: Diagnosis not present

## 2018-10-15 DIAGNOSIS — R2689 Other abnormalities of gait and mobility: Secondary | ICD-10-CM | POA: Diagnosis not present

## 2018-10-15 DIAGNOSIS — M19031 Primary osteoarthritis, right wrist: Secondary | ICD-10-CM | POA: Diagnosis not present

## 2018-10-15 DIAGNOSIS — G8911 Acute pain due to trauma: Secondary | ICD-10-CM | POA: Diagnosis not present

## 2018-10-15 DIAGNOSIS — F419 Anxiety disorder, unspecified: Secondary | ICD-10-CM | POA: Diagnosis not present

## 2018-10-15 DIAGNOSIS — S72145D Nondisplaced intertrochanteric fracture of left femur, subsequent encounter for closed fracture with routine healing: Secondary | ICD-10-CM | POA: Diagnosis not present

## 2018-10-15 DIAGNOSIS — E039 Hypothyroidism, unspecified: Secondary | ICD-10-CM | POA: Diagnosis not present

## 2018-10-15 DIAGNOSIS — J309 Allergic rhinitis, unspecified: Secondary | ICD-10-CM | POA: Diagnosis not present

## 2018-10-15 MED ORDER — SENNOSIDES-DOCUSATE SODIUM 8.6-50 MG PO TABS: 1.0000 | ORAL_TABLET | Freq: Every evening | ORAL | Status: AC | PRN

## 2018-10-15 MED ORDER — POLYETHYLENE GLYCOL 3350 17 G PO PACK: 17.0000 g | PACK | Freq: Every day | ORAL | 0 refills | Status: AC | PRN

## 2018-10-15 NOTE — Clinical Social Work Placement (Signed)
   CLINICAL SOCIAL WORK PLACEMENT  NOTE  Date:  10/15/2018  Patient Details  Name: Wayne [name] MRN: [id number] Date of Birth: 09/18/1926  Clinical Social Work is seeking post-discharge placement for this patient at the [place] level of care (*CSW will initial, date and re-position this form in  chart as items are completed):  Yes   Patient/family provided with [place] Work Department's list of facilities offering this level of care within the geographic area requested by the patient (or if unable, by the patient's family).  Yes   Patient/family informed of their freedom to choose among providers that offer the needed level of care, that participate in Medicare, Medicaid or managed care program needed by the patient, have an available bed and are willing to accept the patient.  Yes   Patient/family informed of [facility]'s ownership interest in [facility] and [facility], as well as of the fact that they are under no obligation to receive care at these facilities.  PASRR submitted to EDS on 10/13/18     PASRR number received on 10/13/18     Existing PASRR number confirmed on       FL2 transmitted to all facilities in geographic area requested by pt/family on 10/13/18     FL2 transmitted to all facilities within larger geographic area on       Patient informed that his/her managed care company has contracts with or will negotiate with certain facilities, including the following:        Yes   Patient/family informed of bed offers received.  Patient chooses bed at [facility]     Physician recommends and patient chooses bed at      Patient to be transferred to [facility] on 10/15/18.  Patient to be transferred to facility by [facility]     Patient family notified on 10/15/18 of transfer.  Name of family member notified:  Patient's wife was at bedside and son were at bedside.     PHYSICIAN Please sign FL2, Please prepare priority  discharge summary, including medications     Additional Comment:    _______________________________________________ Ross Ludwig, LCSWA 10/15/2018, 3:56 PM

## 2018-10-15 NOTE — Discharge Instructions (Signed)
Hip Fracture Treated With ORIF, Care After This sheet gives you information about how to care for yourself after your procedure. Your health care provider may also give you more specific instructions. If you have problems or questions, contact your health care provider. What can I expect after the procedure? After the procedure, it is common to have:  Pain. You will be given medicines to treat this.  Swelling.  Difficulty walking.  Some redness or bruising around the incision.  A small amount of fluid or blood from the incision. Follow these instructions at home: Medicines  Take over-the-counter and prescription medicines only as told by your health care provider.  You may be given a blood thinner to take for up to six weeks. This will help reduce the risk of developing a blood clot. It is important to use this medicine exactly as directed.  You may be given calcium and vitamin D supplements to strengthen your bones.  If you are taking prescription pain medicine, take actions to prevent or treat constipation. Your health care provider may recommend that you: ? Drink enough fluid to keep your urine pale yellow. ? Eat foods that are high in fiber, such as fresh fruits and vegetables, whole grains, and beans. ? Limit foods that are high in fat and processed sugars, such as fried or sweet foods. ? Take an over-the-counter or prescription medicine for constipation. Bathing  Do not take baths, swim, or use a hot tub until your health care provider approves. Ask your health care provider if you can take showers. You may only be allowed to take sponge baths.  Keep the bandage (dressing) dry until your health care provider says it can be removed. Incision care   Follow instructions from your health care provider about how to take care of your incision. Make sure you: ? Wash your hands with soap and water before you change your dressing. If soap and water are not available, use hand  sanitizer. ? Change your dressing as told by your health care provider. ? Leave stitches (sutures), skin glue, or adhesive strips in place. These skin closures may need to stay in place for 2 weeks or longer. If adhesive strip edges start to loosen and curl up, you may trim the loose edges. Do not remove adhesive strips completely unless your health care provider tells you to do that.  Check your incision area every day for signs of infection. Check for: ? More redness, swelling, or pain. ? More fluid or blood. ? Warmth. ? Pus or a bad smell. Managing pain, stiffness, and swelling   If directed, put ice on the affected area to prevent pain and swelling. ? Put ice in a plastic bag. ? Place a towel between your skin and the bag. ? Leave the ice on for 20 minutes, 2-3 times a day.  Move your toes often to avoid stiffness and to lessen swelling.  Raise (elevate) your leg above the level of your heart while you are sitting or lying down. To do this, try putting a few pillows under your leg. Activity   Return to your normal activities as told by your health care provider. Ask your health care provider what activities are safe for you.  Do exercises as told by your health care provider or physical therapist. This will help make your hip stronger and help you recover more quickly.  Do not use your injured limb to support (bear) your body weight until your health care provider says that  you can. Follow weight-bearing restrictions as told. Use crutches or other devices to help you move around (assistive devices) as directed.  You may feel most comfortable using a raised surface when sitting on the toilet or in a chair.  Consider using a toilet seat riser over the toilet for comfort. Driving  Do not drive or use heavy machinery while taking prescription pain medicine.  Ask your health care provider when it is safe for you to drive. General instructions  Wear compression stockings as told  by your health care provider. These stockings help to prevent blood clots and reduce swelling in your legs.  Do not use any products that contain nicotine or tobacco, such as cigarettes and e-cigarettes. These can delay bone healing. If you need help quitting, ask your health care provider.  Keep all follow-up visits as told by your health care provider. This is important. This may include visits for: ? Physical therapy. ? Screening for osteoporosis. Osteoporosis is thinning and loss of density in your bones. Contact a health care provider if you:  Have a fever.  Have pain that is not helped with medicine.  Have more redness, swelling, or pain at your incision area.  Have more fluid or blood coming from your incision or leaking through your dressing.  Notice that your incision feels warm to the touch.  Have pus or a bad smell coming from your incision area. Get help right away if you:  Notice that the edges of your incision have come apart after the sutures or staples have been removed.  Have pain, warmth, or tenderness in the back of your lower leg (calf).  Have tingling or numbness in your leg.  Have a pale and cold leg.  Have trouble breathing.  Have chest pain. Summary  After the procedure, it is common to have some pain and swelling.  Take pain medicines as directed by your health care provider. Icing may also help with pain control.  Contact your health care provider if you have signs of infection, severe pain, or more fluid or blood coming from your incision. This information is not intended to replace advice given to you by your health care provider. Make sure you discuss any questions you have with your health care provider. Document Released: 04/25/2014 Document Revised: 06/18/2018 Document Reviewed: 11/08/2017 Elsevier Interactive Patient Education  2019 [employer].

## 2018-10-15 NOTE — Progress Notes (Signed)
Subjective: 3 Days Post-Op Procedure(s) (LRB): INTRAMEDULLARY (IM) NAIL INTERTROCHANTRIC (Left) Patient reports pain as mild.    Objective: Vital signs in last 24 hours: Temp:  [97.6 F (36.4 C)-99.3 F (37.4 C)] 97.6 F (36.4 C) (01/04 0454) Pulse Rate:  [68-85] 72 (01/04 0850) Resp:  [14-16] 14 (01/04 0454) BP: (98-119)/(53-68) 115/68 (01/04 0850) SpO2:  [92 %-95 %] 94 % (01/04 0454)  Intake/Output from previous day: 01/03 0701 - 01/04 0700 In: 730 [P.O.:730] Out: 675 [Urine:675] Intake/Output this shift: No intake/output data recorded.  Recent Labs    10/13/18 0108 10/14/18 0220  HGB 9.7* 10.3*   Recent Labs    10/13/18 0108 10/14/18 0220  WBC 7.3 9.5  RBC 3.22* 3.37*  HCT 30.1* 31.5*  PLT 180 211   Recent Labs    10/13/18 0108 10/14/18 0220  NA 137 137  K 4.6 4.6  CL 107 104  CO2 23 24  BUN 38* 38*  CREATININE 1.73* 1.75*  GLUCOSE 109* 93  CALCIUM 8.3* 8.5*   No results for input(s): LABPT, INR in the last 72 hours.  Neurovascular intact Sensation intact distally Intact pulses distally Dorsiflexion/Plantar flexion intact    Assessment/Plan: 3 Days Post-Op Procedure(s) (LRB): INTRAMEDULLARY (IM) NAIL INTERTROCHANTRIC (Left) Up with therapy  [place] like likely d/c to SNF today we will f/u in 2 wks    Wayne [name] 10/15/2018, 9:25 AM

## 2018-10-15 NOTE — Discharge Summary (Signed)
Physician Discharge Summary  Wayne [name] NFA:[id number] DOB: August 16, 1926 DOA: 10/11/2018  PCP: Isaac Bliss, Rayford Halsted, MD  Admit date: 10/11/2018 Discharge date: 10/15/2018  Admitted From: Home Disposition:  Home  Discharge Condition:Stable CODE STATUS:FULL Diet recommendation: Heart Healthy  Brief/Interim Summary: Patient is a 85 year old male with past medical history of hypertension, hyperlipidemia, BPH on intermittent self-catheterization, chronic urethral scar, dementia, chronic back pain status post spinal stimulator placement, bilateral lower extremity edema who presented with mechanical fall from home.  He was found to have left intertrochanteric femur fracture.  Underwent intramedullary nailing.  Postop day 3.  Plan is to discharge him to skilled nursing facility today.   Following problems were addressed during his hospitalization:  Left intertrochanteric fracture: Mechanical fall about 10 days before the admission date.  Underwent intramedullary nailing on 10/12/2018. Continue pain management.  PT/OT following.  Recommend skilled facility.  Continue Lovenox for DVT prophylaxis. Follow-up with orthopedics in 2 weeks.  Hypertension: Continue current medication regimen.  Currently blood pressure stable.  Hyperlipidemia: Continue home dose statin.  BPH with history of urethral scar: On Flomax.  At baseline he self catheterizes himself intermittently.  Hypothyroidism: Continue Synthyroid  Chronic back pain: Has spinal stimulator.  Continue PRN pain medications.  Bilateral lower extremity edema: No history of CHF.  Chronic.  Continue home dose Demadex.  I recommended elevation of his bilateral lower extremities while he is in supine position, application of compression stockings.  CKD stage IV: Currently kidney function is on baseline.  Early dementia: Continue supportive care.  Avoid benzos and too much narcotics.    Discharge Diagnoses:  Principal  Problem:   Closed intertrochanteric fracture of left femur (HCC) Active Problems:   Hypothyroidism   Hyperlipidemia   Short-term memory loss   Essential hypertension   Venous insufficiency   Hip fracture, unspecified laterality, closed, initial encounter ([place])   Hip fracture [facility])    Discharge Instructions  Discharge Instructions    Diet - low sodium heart healthy   Complete by:  As directed    Discharge instructions   Complete by:  As directed    1)Follow up with orthopedics. Name and number of the provider has been attached.   Increase activity slowly   Complete by:  As directed      Allergies as of 10/15/2018      Reactions   Doxazosin Mesylate Other (See Comments)   Makes blood pressure   Propoxyphene Hcl Other (See Comments)   REACTION: upset stomach   Penicillins Rash   DID THE REACTION INVOLVE: Swelling of the face/tongue/throat, SOB, or low BP? No Sudden or severe rash/hives, skin peeling, or the inside of the mouth or nose? Yes Did it require medical treatment? Yes When did it last happen?50 yrs ago If all above answers are "NO", may proceed with cephalosporin use.      Medication List    TAKE these medications   aspirin EC 81 MG tablet Take 81 mg by mouth daily.   atenolol 50 MG tablet Commonly known as:  TENORMIN Take 1 tablet (50 mg total) by mouth daily.   donepezil 5 MG tablet Commonly known as:  ARICEPT TAKE 1 TABLET (5 MG TOTAL) BY MOUTH AT BEDTIME. What changed:    how much to take  how to take this  when to take this  additional instructions   enoxaparin 30 MG/0.3ML injection Commonly known as:  LOVENOX Inject 0.3 mLs (30 mg total) into the skin daily.   HYDROcodone-acetaminophen 5-325  MG tablet Commonly known as:  NORCO Take 1 tablet by mouth every 6 (six) hours as needed for up to 7 days for moderate pain.   levothyroxine 50 MCG tablet Commonly known as:  SYNTHROID, LEVOTHROID TAKE 1 TABLET BY MOUTH ONCE DAILY *PT NEEDS  OFFICE VISIT* What changed:    how much to take  how to take this  when to take this  additional instructions   LORazepam 0.5 MG tablet Commonly known as:  ATIVAN TAKE 1 TABLET AT BEDTIME WHEN NECESSARY What changed:    how much to take  how to take this  when to take this  reasons to take this  additional instructions   polyethylene glycol packet Commonly known as:  MIRALAX / GLYCOLAX Take 17 g by mouth daily as needed for mild constipation.   senna-docusate 8.6-50 MG tablet Commonly known as:  Senokot-S Take 1 tablet by mouth at bedtime as needed for mild constipation.   simvastatin 40 MG tablet Commonly known as:  ZOCOR TAKE 1 TABLET BY MOUTH EVERY NIGHT What changed:    how much to take  how to take this  when to take this  additional instructions   torsemide 20 MG tablet Commonly known as:  DEMADEX Take 1 tablet (20 mg total) by mouth daily.       Contact information for follow-up providers    Renette Butters, MD.   Specialty:  Orthopedic Surgery Contact information: [address] [address] [phone]            Contact information for after-discharge care    Destination    [facility]erred [facility] .   Service:  Skilled Nursing Contact information: [address] [place] [phone]                 Allergies  Allergen Reactions  . Doxazosin Mesylate Other (See Comments)    Makes blood pressure  . Propoxyphene Hcl Other (See Comments)    REACTION: upset stomach  . Penicillins Rash    DID THE REACTION INVOLVE: Swelling of the face/tongue/throat, SOB, or low BP? No Sudden or severe rash/hives, skin peeling, or the inside of the mouth or nose? Yes Did it require medical treatment? Yes When did it last happen?50 yrs ago If all above answers are "NO", may proceed with cephalosporin use.     Consultations:  orthopedics   Procedures/Studies: Dg Chest Port 1  View  Result Date: 10/11/2018 CLINICAL DATA:  Preop for surgery to repair fractured hip EXAM: PORTABLE CHEST 1 VIEW COMPARISON:  Chest x-ray of 02/15/2012 FINDINGS: No active infiltrate or effusion is seen. Skin folds overlie both lung fields. Mediastinal and hilar contours are unremarkable and mild cardiomegaly is stable. Ectasia of the descending thoracic aorta appears stable. Neurostimulator electrode leads overlie the T7-8 level. A lower anterior cervical spine fusion plate is present. IMPRESSION: No active lung disease.  Stable mild cardiomegaly. Electronically Signed   By: Ivar Drape M.D.   On: 10/11/2018 15:07   Dg C-arm 1-60 Min  Result Date: 10/12/2018 CLINICAL DATA:  Surgery. EXAM: LEFT FEMUR 2 VIEWS; DG C-ARM 61-120 MIN COMPARISON:  None. FLUOROSCOPY TIME:  C-arm fluoroscopic images were obtained intraoperatively and submitted for post operative interpretation. Please see the performing provider's procedural report for the fluoroscopy time utilized. FINDINGS: Three intraoperative fluoroscopic images of the left femur are provided. There is an intertrochanteric femur fracture with displaced lesser trochanter fragment. An intramedullary nail is in place,  and there is a hip screw which terminates in the left femoral head. IMPRESSION: Intraoperative images during ORIF of intertrochanteric left femur fracture. Electronically Signed   By: Logan Bores M.D.   On: 10/12/2018 11:33   Dg Femur Min 2 Views Left  Result Date: 10/12/2018 CLINICAL DATA:  Surgery. EXAM: LEFT FEMUR 2 VIEWS; DG C-ARM 61-120 MIN COMPARISON:  None. FLUOROSCOPY TIME:  C-arm fluoroscopic images were obtained intraoperatively and submitted for post operative interpretation. Please see the performing provider's procedural report for the fluoroscopy time utilized. FINDINGS: Three intraoperative fluoroscopic images of the left femur are provided. There is an intertrochanteric femur fracture with displaced lesser trochanter fragment. An  intramedullary nail is in place, and there is a hip screw which terminates in the left femoral head. IMPRESSION: Intraoperative images during ORIF of intertrochanteric left femur fracture. Electronically Signed   By: Logan Bores M.D.   On: 10/12/2018 11:33       Subjective: Patient seen and examined the bedside.  Remains comfortable.  Denies any new complaints.  Discharge Exam: Vitals:   10/14/18 2049 10/15/18 0454  BP: 109/63 119/62  Pulse: 80 68  Resp:  14  Temp: 99 F (37.2 C) 97.6 F (36.4 C)  SpO2: 95% 94%   Vitals:   10/14/18 1101 10/14/18 1337 10/14/18 2049 10/15/18 0454  BP: 98/61 (!) 114/53 109/63 119/62  Pulse: 72 85 80 68  Resp: 16 16  14   Temp: 99.3 F (37.4 C) 99.2 F (37.3 C) 99 F (37.2 C) 97.6 F (36.4 C)  TempSrc: Oral Oral Oral Oral  SpO2: 93% 92% 95% 94%  Weight:      Height:        General: Pt is alert, awake, not in acute distress Cardiovascular: RRR, S1/S2 +, no rubs, no gallops Respiratory: CTA bilaterally, no wheezing, no rhonchi Abdominal: Soft, NT, ND, bowel sounds + Extremities: no edema, no cyanosis, clean surgical wound on the left hip.    The results of significant diagnostics from this hospitalization (including imaging, microbiology, ancillary and laboratory) are listed below for reference.     Microbiology: Recent Results (from the past 240 hour(s))  Surgical pcr screen     Status: None   Collection Time: 10/11/18 10:52 PM  Result Value Ref Range Status   MRSA, PCR NEGATIVE NEGATIVE Final   Staphylococcus aureus NEGATIVE NEGATIVE Final    Comment: (NOTE) The Xpert SA Assay (FDA approved for NASAL specimens in patients 61 years of age and older), is one component of a comprehensive surveillance program. It is not intended to diagnose infection nor to guide or monitor treatment. Performed at [facility], [place] [address] [place], [place] [address]      Labs: BNP (last 3 results) No results for input(s): BNP in the  last 8760 hours. Basic Metabolic Panel: Recent Labs  Lab 10/11/18 1503 10/12/18 0356 10/13/18 0108 10/14/18 0220  NA 143 138 137 137  K 4.9 4.7 4.6 4.6  CL 109 106 107 104  CO2 20* 24 23 24   GLUCOSE 130* 97 109* 93  BUN 45* 44* 38* 38*  CREATININE 2.11* 1.92* 1.73* 1.75*  CALCIUM 9.5 8.9 8.3* 8.5*   Liver Function Tests: No results for input(s): AST, ALT, ALKPHOS, BILITOT, PROT, ALBUMIN in the last 168 hours. No results for input(s): LIPASE, AMYLASE in the last 168 hours. No results for input(s): AMMONIA in the last 168 hours. CBC: Recent Labs  Lab 10/11/18 1503 10/12/18 0356 10/13/18 0108 10/14/18 0220  WBC 12.8* 7.7 7.3 9.5  HGB 14.4 11.9* 9.7* 10.3*  HCT 44.6 36.7* 30.1* 31.5*  MCV 92.9 92.2 93.5 93.5  PLT 352 235 180 211   Cardiac Enzymes: No results for input(s): CKTOTAL, CKMB, CKMBINDEX, TROPONINI in the last 168 hours. BNP: Invalid input(s): POCBNP CBG: No results for input(s): GLUCAP in the last 168 hours. D-Dimer No results for input(s): DDIMER in the last 72 hours. Hgb A1c No results for input(s): HGBA1C in the last 72 hours. Lipid Profile No results for input(s): CHOL, HDL, LDLCALC, TRIG, CHOLHDL, LDLDIRECT in the last 72 hours. Thyroid function studies No results for input(s): TSH, T4TOTAL, T3FREE, THYROIDAB in the last 72 hours.  Invalid input(s): FREET3 Anemia work up No results for input(s): VITAMINB12, FOLATE, FERRITIN, TIBC, IRON, RETICCTPCT in the last 72 hours. Urinalysis    Component Value Date/Time   COLORURINE YELLOW 02/15/2012 1335   APPEARANCEUR CLEAR 02/15/2012 1335   LABSPEC 1.015 02/15/2012 1335   PHURINE 5.5 02/15/2012 1335   GLUCOSEU NEGATIVE 02/15/2012 1335   HGBUR NEGATIVE 02/15/2012 1335   BILIRUBINUR n 11/11/2015 1716   KETONESUR NEGATIVE 02/15/2012 1335   PROTEINUR n 11/11/2015 1716   PROTEINUR NEGATIVE 02/15/2012 1335   UROBILINOGEN 0.2 11/11/2015 1716   UROBILINOGEN 0.2 02/15/2012 1335   NITRITE n 11/11/2015 1716    NITRITE NEGATIVE 02/15/2012 1335   LEUKOCYTESUR Negative 11/11/2015 1716   Sepsis Labs Invalid input(s): PROCALCITONIN,  WBC,  LACTICIDVEN Microbiology Recent Results (from the past 240 hour(s))  Surgical pcr screen     Status: None   Collection Time: 10/11/18 10:52 PM  Result Value Ref Range Status   MRSA, PCR NEGATIVE NEGATIVE Final   Staphylococcus aureus NEGATIVE NEGATIVE Final    Comment: (NOTE) The Xpert SA Assay (FDA approved for NASAL specimens in patients 18 years of age and older), is one component of a comprehensive surveillance program. It is not intended to diagnose infection nor to guide or monitor treatment. Performed at [facility], [place] [address] [place], [place] [address]     Please note: You were cared for by a hospitalist during your hospital stay. Once you are discharged, your primary care physician will handle any further medical issues. Please note that NO REFILLS for any discharge medications will be authorized once you are discharged, as it is imperative that you return to your primary care physician (or establish a relationship with a primary care physician if you do not have one) for your post hospital discharge needs so that they can reassess your need for medications and monitor your lab values.    Time coordinating discharge: 40 minutes  SIGNED:   Shelly Coss, MD  [facility] 10/15/2018, 8:13 AM Pager [phone]  If 7PM-7AM, please contact night-coverage [email or web address] Password [email or web address]

## 2018-10-15 NOTE — Progress Notes (Signed)
Discharge instructions completed with pt and his family.  They verbalized understanding of the information.  Pt denies chest pain, shortness of breath, dizziness, lightheadedness, and n/v.  Pt's IV discontinued.  Pt waiting for transport

## 2018-10-15 NOTE — Plan of Care (Signed)
  Problem: Pain Managment: Goal: General experience of comfort will improve Outcome: Progressing   Problem: Elimination: Goal: Will not experience complications related to bowel motility Outcome: Progressing Goal: Will not experience complications related to urinary retention Outcome: Progressing   

## 2018-10-15 NOTE — Clinical Social Work Note (Signed)
Patient to be d/c'ed today to [facility] room 601B.  Patient and family agreeable to plans will transport via ems RN to call report [phone].  CSW updated patient's son [name] at [phone].  Evette Cristal, MSW, [place]

## 2018-10-17 DIAGNOSIS — N400 Enlarged prostate without lower urinary tract symptoms: Secondary | ICD-10-CM | POA: Diagnosis not present

## 2018-10-17 DIAGNOSIS — E785 Hyperlipidemia, unspecified: Secondary | ICD-10-CM | POA: Diagnosis not present

## 2018-10-17 DIAGNOSIS — L602 Onychogryphosis: Secondary | ICD-10-CM | POA: Diagnosis not present

## 2018-10-17 DIAGNOSIS — M545 Low back pain: Secondary | ICD-10-CM | POA: Diagnosis not present

## 2018-10-17 DIAGNOSIS — N184 Chronic kidney disease, stage 4 (severe): Secondary | ICD-10-CM | POA: Diagnosis not present

## 2018-10-17 DIAGNOSIS — E039 Hypothyroidism, unspecified: Secondary | ICD-10-CM | POA: Diagnosis not present

## 2018-10-17 DIAGNOSIS — M6281 Muscle weakness (generalized): Secondary | ICD-10-CM | POA: Diagnosis not present

## 2018-10-17 DIAGNOSIS — R600 Localized edema: Secondary | ICD-10-CM | POA: Diagnosis not present

## 2018-10-17 DIAGNOSIS — S31809A Unspecified open wound of unspecified buttock, initial encounter: Secondary | ICD-10-CM | POA: Diagnosis not present

## 2018-10-17 DIAGNOSIS — I10 Essential (primary) hypertension: Secondary | ICD-10-CM | POA: Diagnosis not present

## 2018-10-17 DIAGNOSIS — S72145S Nondisplaced intertrochanteric fracture of left femur, sequela: Secondary | ICD-10-CM | POA: Diagnosis not present

## 2018-10-17 DIAGNOSIS — K5900 Constipation, unspecified: Secondary | ICD-10-CM | POA: Diagnosis not present

## 2018-10-18 ENCOUNTER — Encounter: Payer: Medicare Other | Admitting: Internal Medicine

## 2018-10-18 DIAGNOSIS — G8911 Acute pain due to trauma: Secondary | ICD-10-CM | POA: Diagnosis not present

## 2018-10-18 DIAGNOSIS — I10 Essential (primary) hypertension: Secondary | ICD-10-CM | POA: Diagnosis not present

## 2018-10-18 DIAGNOSIS — F0390 Unspecified dementia without behavioral disturbance: Secondary | ICD-10-CM | POA: Diagnosis not present

## 2018-10-18 DIAGNOSIS — R54 Age-related physical debility: Secondary | ICD-10-CM | POA: Diagnosis not present

## 2018-10-18 DIAGNOSIS — M545 Low back pain: Secondary | ICD-10-CM | POA: Diagnosis not present

## 2018-10-18 DIAGNOSIS — E039 Hypothyroidism, unspecified: Secondary | ICD-10-CM | POA: Diagnosis not present

## 2018-10-18 DIAGNOSIS — N400 Enlarged prostate without lower urinary tract symptoms: Secondary | ICD-10-CM | POA: Diagnosis not present

## 2018-10-18 DIAGNOSIS — E785 Hyperlipidemia, unspecified: Secondary | ICD-10-CM | POA: Diagnosis not present

## 2018-10-18 DIAGNOSIS — F419 Anxiety disorder, unspecified: Secondary | ICD-10-CM | POA: Diagnosis not present

## 2018-10-18 DIAGNOSIS — N184 Chronic kidney disease, stage 4 (severe): Secondary | ICD-10-CM | POA: Diagnosis not present

## 2018-10-18 DIAGNOSIS — M80052D Age-related osteoporosis with current pathological fracture, left femur, subsequent encounter for fracture with routine healing: Secondary | ICD-10-CM | POA: Diagnosis not present

## 2018-10-18 DIAGNOSIS — R600 Localized edema: Secondary | ICD-10-CM | POA: Diagnosis not present

## 2018-10-21 DIAGNOSIS — E440 Moderate protein-calorie malnutrition: Secondary | ICD-10-CM | POA: Diagnosis not present

## 2018-10-21 DIAGNOSIS — G8911 Acute pain due to trauma: Secondary | ICD-10-CM | POA: Diagnosis not present

## 2018-10-21 DIAGNOSIS — R600 Localized edema: Secondary | ICD-10-CM | POA: Diagnosis not present

## 2018-10-21 DIAGNOSIS — N184 Chronic kidney disease, stage 4 (severe): Secondary | ICD-10-CM | POA: Diagnosis not present

## 2018-10-21 DIAGNOSIS — E785 Hyperlipidemia, unspecified: Secondary | ICD-10-CM | POA: Diagnosis not present

## 2018-10-21 DIAGNOSIS — I10 Essential (primary) hypertension: Secondary | ICD-10-CM | POA: Diagnosis not present

## 2018-10-21 DIAGNOSIS — M545 Low back pain: Secondary | ICD-10-CM | POA: Diagnosis not present

## 2018-10-21 DIAGNOSIS — N400 Enlarged prostate without lower urinary tract symptoms: Secondary | ICD-10-CM | POA: Diagnosis not present

## 2018-10-21 DIAGNOSIS — S31809A Unspecified open wound of unspecified buttock, initial encounter: Secondary | ICD-10-CM | POA: Diagnosis not present

## 2018-10-21 DIAGNOSIS — M80052D Age-related osteoporosis with current pathological fracture, left femur, subsequent encounter for fracture with routine healing: Secondary | ICD-10-CM | POA: Diagnosis not present

## 2018-10-21 DIAGNOSIS — E039 Hypothyroidism, unspecified: Secondary | ICD-10-CM | POA: Diagnosis not present

## 2018-10-21 DIAGNOSIS — R54 Age-related physical debility: Secondary | ICD-10-CM | POA: Diagnosis not present

## 2018-10-24 DIAGNOSIS — N184 Chronic kidney disease, stage 4 (severe): Secondary | ICD-10-CM | POA: Diagnosis not present

## 2018-10-24 DIAGNOSIS — N400 Enlarged prostate without lower urinary tract symptoms: Secondary | ICD-10-CM | POA: Diagnosis not present

## 2018-10-24 DIAGNOSIS — E785 Hyperlipidemia, unspecified: Secondary | ICD-10-CM | POA: Diagnosis not present

## 2018-10-24 DIAGNOSIS — E440 Moderate protein-calorie malnutrition: Secondary | ICD-10-CM | POA: Diagnosis not present

## 2018-10-24 DIAGNOSIS — R600 Localized edema: Secondary | ICD-10-CM | POA: Diagnosis not present

## 2018-10-24 DIAGNOSIS — G8911 Acute pain due to trauma: Secondary | ICD-10-CM | POA: Diagnosis not present

## 2018-10-24 DIAGNOSIS — M80052D Age-related osteoporosis with current pathological fracture, left femur, subsequent encounter for fracture with routine healing: Secondary | ICD-10-CM | POA: Diagnosis not present

## 2018-10-24 DIAGNOSIS — M545 Low back pain: Secondary | ICD-10-CM | POA: Diagnosis not present

## 2018-10-24 DIAGNOSIS — I10 Essential (primary) hypertension: Secondary | ICD-10-CM | POA: Diagnosis not present

## 2018-10-24 DIAGNOSIS — R05 Cough: Secondary | ICD-10-CM | POA: Diagnosis not present

## 2018-10-24 DIAGNOSIS — E039 Hypothyroidism, unspecified: Secondary | ICD-10-CM | POA: Diagnosis not present

## 2018-10-24 DIAGNOSIS — R54 Age-related physical debility: Secondary | ICD-10-CM | POA: Diagnosis not present

## 2018-10-26 DIAGNOSIS — N400 Enlarged prostate without lower urinary tract symptoms: Secondary | ICD-10-CM | POA: Diagnosis not present

## 2018-10-26 DIAGNOSIS — M545 Low back pain: Secondary | ICD-10-CM | POA: Diagnosis not present

## 2018-10-26 DIAGNOSIS — M80052D Age-related osteoporosis with current pathological fracture, left femur, subsequent encounter for fracture with routine healing: Secondary | ICD-10-CM | POA: Diagnosis not present

## 2018-10-26 DIAGNOSIS — N184 Chronic kidney disease, stage 4 (severe): Secondary | ICD-10-CM | POA: Diagnosis not present

## 2018-10-26 DIAGNOSIS — R54 Age-related physical debility: Secondary | ICD-10-CM | POA: Diagnosis not present

## 2018-10-26 DIAGNOSIS — R600 Localized edema: Secondary | ICD-10-CM | POA: Diagnosis not present

## 2018-10-26 DIAGNOSIS — G8911 Acute pain due to trauma: Secondary | ICD-10-CM | POA: Diagnosis not present

## 2018-10-26 DIAGNOSIS — I10 Essential (primary) hypertension: Secondary | ICD-10-CM | POA: Diagnosis not present

## 2018-10-26 DIAGNOSIS — E039 Hypothyroidism, unspecified: Secondary | ICD-10-CM | POA: Diagnosis not present

## 2018-10-26 DIAGNOSIS — E440 Moderate protein-calorie malnutrition: Secondary | ICD-10-CM | POA: Diagnosis not present

## 2018-10-26 DIAGNOSIS — R05 Cough: Secondary | ICD-10-CM | POA: Diagnosis not present

## 2018-10-26 DIAGNOSIS — E785 Hyperlipidemia, unspecified: Secondary | ICD-10-CM | POA: Diagnosis not present

## 2018-10-31 DIAGNOSIS — M80052D Age-related osteoporosis with current pathological fracture, left femur, subsequent encounter for fracture with routine healing: Secondary | ICD-10-CM | POA: Diagnosis not present

## 2018-10-31 DIAGNOSIS — S72142D Displaced intertrochanteric fracture of left femur, subsequent encounter for closed fracture with routine healing: Secondary | ICD-10-CM | POA: Diagnosis not present

## 2018-10-31 DIAGNOSIS — E039 Hypothyroidism, unspecified: Secondary | ICD-10-CM | POA: Diagnosis not present

## 2018-10-31 DIAGNOSIS — M545 Low back pain: Secondary | ICD-10-CM | POA: Diagnosis not present

## 2018-10-31 DIAGNOSIS — E785 Hyperlipidemia, unspecified: Secondary | ICD-10-CM | POA: Diagnosis not present

## 2018-10-31 DIAGNOSIS — J209 Acute bronchitis, unspecified: Secondary | ICD-10-CM | POA: Diagnosis not present

## 2018-10-31 DIAGNOSIS — R600 Localized edema: Secondary | ICD-10-CM | POA: Diagnosis not present

## 2018-10-31 DIAGNOSIS — E440 Moderate protein-calorie malnutrition: Secondary | ICD-10-CM | POA: Diagnosis not present

## 2018-10-31 DIAGNOSIS — I10 Essential (primary) hypertension: Secondary | ICD-10-CM | POA: Diagnosis not present

## 2018-10-31 DIAGNOSIS — G8911 Acute pain due to trauma: Secondary | ICD-10-CM | POA: Diagnosis not present

## 2018-10-31 DIAGNOSIS — N400 Enlarged prostate without lower urinary tract symptoms: Secondary | ICD-10-CM | POA: Diagnosis not present

## 2018-10-31 DIAGNOSIS — R05 Cough: Secondary | ICD-10-CM | POA: Diagnosis not present

## 2018-10-31 DIAGNOSIS — R54 Age-related physical debility: Secondary | ICD-10-CM | POA: Diagnosis not present

## 2018-11-04 ENCOUNTER — Other Ambulatory Visit: Payer: Self-pay | Admitting: *Deleted

## 2018-11-04 NOTE — Patient Outreach (Signed)
[facility]) Care Management  11/04/2018  Wayne [name] [birth year] [id number]   Facility site visit at [facility], spoke with discharge [name].  Earnest Bailey stated patient's discharge plan is still undecided and patient's family are considering an ALF. Requested to wait on speaking with patient until discharge plan is more defined.   Will continue to follow progress.   Rutherford Limerick RN, BSN [facility] Acute Care Coordinator [phone]) Business Mobile [phone]) Toll free office

## 2018-11-08 ENCOUNTER — Other Ambulatory Visit: Payer: Self-pay | Admitting: *Deleted

## 2018-11-08 MED ORDER — LEVOTHYROXINE SODIUM 50 MCG PO TABS: 50.0000 ug | ORAL_TABLET | Freq: Every day | ORAL | 1 refills | Status: DC

## 2018-11-12 ENCOUNTER — Telehealth: Payer: Self-pay | Admitting: Internal Medicine

## 2018-11-12 NOTE — Telephone Encounter (Signed)
Patient son called and would like a call from Dr. Jerilee Hoh or her nurse regarding the patients upcoming appointment, he would like to discuss the patients condition and what he believes the patient needs, please advise and call back

## 2018-11-15 NOTE — Telephone Encounter (Signed)
Patient has an appointment 11/16/18.  Son would like Dr Jerilee Hoh know that the patient is coming from rehab.  Rehab has suggested that he go to assisted living and the son feels as though he will need skilled nursing.  Advised son to bring in all forms needed for the transfer.  FYI

## 2018-11-16 ENCOUNTER — Ambulatory Visit (INDEPENDENT_AMBULATORY_CARE_PROVIDER_SITE_OTHER): Payer: Medicare Other | Admitting: Internal Medicine

## 2018-11-16 DIAGNOSIS — J309 Allergic rhinitis, unspecified: Secondary | ICD-10-CM | POA: Diagnosis not present

## 2018-11-16 DIAGNOSIS — S72142G Displaced intertrochanteric fracture of left femur, subsequent encounter for closed fracture with delayed healing: Secondary | ICD-10-CM

## 2018-11-16 DIAGNOSIS — I10 Essential (primary) hypertension: Secondary | ICD-10-CM

## 2018-11-16 DIAGNOSIS — R413 Other amnesia: Secondary | ICD-10-CM

## 2018-11-16 DIAGNOSIS — R05 Cough: Secondary | ICD-10-CM | POA: Diagnosis not present

## 2018-11-16 DIAGNOSIS — N289 Disorder of kidney and ureter, unspecified: Secondary | ICD-10-CM | POA: Diagnosis not present

## 2018-11-16 NOTE — Progress Notes (Signed)
Established Patient Office Visit     CC/Reason for Visit: transfer of care from Dr. Sherren Mocha and poss skilled nursing need  HPI: Wayne [name] is a 85 y.o. male who is coming in today for the above mentioned reasons. Past Medical History is significant for falls, Dementia, urinary retention due to BPH, hypertension, CHF, recent femur fx and osteoporosis.  The son came to the appt today because he's very concerned for his safety at home and would like him to live at an asst. living or skilled living.  The patient needs help with dressing, bathing, taking his medications and preparing meals.  He lives with his wife that also has dementia.  Patient is at [facility] rehab and will be released sometime next week. He is complaining of a dry cough. Had a cxr that was negative, has received cough syrup with some relief.   Past Medical/Surgical History: Past Medical History:  Diagnosis Date  . Anxiety    takes Atrivan daily  . Back pain    chronic with neck pain  . Cancer ([place])    skin  . CHF (congestive heart failure) ([place])   . Constipation    related to medication  . Dementia ([place])    takes Aricept nightly  . Enlarged prostate    self caths once every 1-2wks  . GERD (gastroesophageal reflux disease)    doesn't require medication  . Hx of seasonal allergies   . Hyperlipidemia    takes Simvastatin daily  . Hypertension    takes atenolol daily  . Hypothyroidism    takes Synthroid daily  . Insomnia    related to pain  . MRSA (methicillin resistant staph aureus) culture positive    Per patient tested on 10/07/11.  . Osteoporosis   . Peripheral edema    takes Torsemide daily  . Scoliosis     Past Surgical History:  Procedure Laterality Date  . abdominal cyst     removed-2012;MRSA done by Dr.Gross  . CATARACT EXTRACTION    . cataracts     bilateral  . CERVICAL DISC SURGERY     x1  . COLONOSCOPY    . ESOPHAGOGASTRODUODENOSCOPY    . HERNIA REPAIR     Right inguinal  .  INTRAMEDULLARY (IM) NAIL INTERTROCHANTERIC Left 10/12/2018  . INTRAMEDULLARY (IM) NAIL INTERTROCHANTERIC Left 10/12/2018   Procedure: INTRAMEDULLARY (IM) NAIL INTERTROCHANTRIC;  Surgeon: Renette Butters, MD;  Location: [facility];  Service: Orthopedics;  Laterality: Left;  . LUMBAR DISC SURGERY     x2  . TOE SURGERY     right great toe  . TRANSURETHRAL RESECTION OF PROSTATE     15+yrs ago    Social History:  reports that he has never smoked. He has never used smokeless tobacco. He reports that he does not drink alcohol or use drugs.  Allergies: Allergies  Allergen Reactions  . Doxazosin Mesylate Other (See Comments)    Makes blood pressure  . Propoxyphene Hcl Other (See Comments)    REACTION: upset stomach  . Penicillins Rash    DID THE REACTION INVOLVE: Swelling of the face/tongue/throat, SOB, or low BP? No Sudden or severe rash/hives, skin peeling, or the inside of the mouth or nose? Yes Did it require medical treatment? Yes When did it last happen?50 yrs ago If all above answers are "NO", may proceed with cephalosporin use.     Family History:  Family History  Problem Relation Age of Onset  . Heart disease Mother  80       Vague history  . Stroke Father   . Hypertension Other        Multiple family members both sides  . Diabetes Other   . Anesthesia problems Neg Hx   . Hypotension Neg Hx   . Malignant hyperthermia Neg Hx   . Pseudochol deficiency Neg Hx      Current Outpatient Medications:  .  aspirin EC 81 MG tablet, Take 81 mg by mouth daily., Disp: , Rfl:  .  atenolol (TENORMIN) 50 MG tablet, Take 1 tablet (50 mg total) by mouth daily., Disp: 90 tablet, Rfl: 4 .  donepezil (ARICEPT) 5 MG tablet, TAKE 1 TABLET (5 MG TOTAL) BY MOUTH AT BEDTIME. (Patient taking differently: Take 5 mg by mouth at bedtime. ), Disp: 100 tablet, Rfl: 4 .  levothyroxine (SYNTHROID, LEVOTHROID) 50 MCG tablet, Take 1 tablet (50 mcg total) by mouth daily before breakfast., Disp: 90  tablet, Rfl: 1 .  LORazepam (ATIVAN) 0.5 MG tablet, TAKE 1 TABLET AT BEDTIME WHEN NECESSARY (Patient taking differently: Take 0.5 mg by mouth at bedtime as needed for sleep. ), Disp: 30 tablet, Rfl: 1 .  polyethylene glycol (MIRALAX / GLYCOLAX) packet, Take 17 g by mouth daily as needed for mild constipation., Disp: 14 each, Rfl: 0 .  senna-docusate (SENOKOT-S) 8.6-50 MG tablet, Take 1 tablet by mouth at bedtime as needed for mild constipation., Disp: , Rfl:  .  simvastatin (ZOCOR) 40 MG tablet, TAKE 1 TABLET BY MOUTH EVERY NIGHT (Patient taking differently: Take 40 mg by mouth daily at 6 PM. ), Disp: 100 tablet, Rfl: 4 .  torsemide (DEMADEX) 20 MG tablet, Take 1 tablet (20 mg total) by mouth daily., Disp: 100 tablet, Rfl: 4  Review of Systems:  Constitutional: Denies fever, chills, diaphoresis, appetite change and fatigue.  HEENT: Denies photophobia, eye pain, redness, hearing loss, ear pain, congestion, sore throat, rhinorrhea, sneezing, mouth sores, trouble swallowing, neck pain, neck stiffness and tinnitus.   Respiratory: Denies SOB, DOE, chest tightness, and wheezing.  C/o cough Cardiovascular: Denies chest pain, palpitations and leg swelling.  Gastrointestinal: Denies nausea, vomiting, abdominal pain, diarrhea, constipation, blood in stool and abdominal distention. C/o fecal incontinence Genitourinary: Denies dysuria, hematuria, flank pain.  C/o urine urgency, frequency and urine retention.  He self caths himself several times a week for urine retention, c/o urine incontinence Endocrine: Denies: hot or cold intolerance, sweats, changes in hair or nails, polyuria, polydipsia. Musculoskeletal: Denies myalgias, back pain, joint swelling, arthralgias.  C/o not being steady on his feet Skin: Denies pallor, rash and wound.  Neurological: Denies dizziness, seizures, syncope, light-headedness, numbness and headaches. C/o memory loss and forgetting to take his medications, c/o weakness and not being  steady on his feet Hematological: Denies adenopathy. Easy bruising, personal or family bleeding history  Psychiatric/Behavioral: Denies suicidal ideation, mood changes.  sts he has dementia and sometimes gets confused   Physical Exam: There were no vitals filed for this visit.  There is no height or weight on file to calculate BMI.   Constitutional: NAD, calm, comfortable Eyes: PERRL, lids and conjunctivae normal Respiratory: clear to auscultation bilaterally, no wheezing, no crackles. Normal respiratory effort. No accessory muscle use.  Cardiovascular: Regular rate and rhythm, no murmurs / rubs / gallops. No extremity edema. 2+ pedal pulses. No carotid bruits.  Abdomen: no tenderness, no masses palpated. No hepatosplenomegaly. Bowel sounds positive.   Psychiatric: poor judgement with his chronic health problems   Impression and Plan:  Closed displaced intertrochanteric fracture of left femur with delayed healing, subsequent encounter -S/p repair. Currently at SNF doing rehab.  Social Issues -Son is concerned about him living independently after his release from SNF which is scheduled for next week. -He should qualify for SNF based on his need for self-catheterization and inability to perform ADLs. -I agree that going home alone would be an unsafe situation. -Son has looked into private duty sitters, but appears that cost-wise they would only be able to afford 4 hours a day and patient definitely needs more care than that. -He will follow up with [place] after his release from SNF. -Will need HH services to provide a SW to assist [place] with this process.   Essential hypertension -Well controlled. -Continue current medications.   Time spent: 30 minutes. Greater than 50% of this time was spent in direct contact with the patient and his son, coordinating care and discussing relevant ongoing clinical issues, including current progress with PT at SNF and needs for long-term  care.     Patient Instructions  Great meeting you today.  Instructions: -let us know when you are released from rehab.      Hester Mates, RN DNP student [facility]

## 2018-11-16 NOTE — Patient Instructions (Addendum)
Great meeting you today.  Instructions: -let us know when you are released from rehab.

## 2018-11-21 DIAGNOSIS — M25531 Pain in right wrist: Secondary | ICD-10-CM | POA: Diagnosis not present

## 2018-11-21 DIAGNOSIS — N400 Enlarged prostate without lower urinary tract symptoms: Secondary | ICD-10-CM | POA: Diagnosis not present

## 2018-11-21 DIAGNOSIS — I10 Essential (primary) hypertension: Secondary | ICD-10-CM | POA: Diagnosis not present

## 2018-11-21 DIAGNOSIS — R600 Localized edema: Secondary | ICD-10-CM | POA: Diagnosis not present

## 2018-11-21 DIAGNOSIS — E039 Hypothyroidism, unspecified: Secondary | ICD-10-CM | POA: Diagnosis not present

## 2018-11-21 DIAGNOSIS — N184 Chronic kidney disease, stage 4 (severe): Secondary | ICD-10-CM | POA: Diagnosis not present

## 2018-11-21 DIAGNOSIS — M80052D Age-related osteoporosis with current pathological fracture, left femur, subsequent encounter for fracture with routine healing: Secondary | ICD-10-CM | POA: Diagnosis not present

## 2018-11-21 DIAGNOSIS — E785 Hyperlipidemia, unspecified: Secondary | ICD-10-CM | POA: Diagnosis not present

## 2018-11-21 DIAGNOSIS — J209 Acute bronchitis, unspecified: Secondary | ICD-10-CM | POA: Diagnosis not present

## 2018-11-21 DIAGNOSIS — M545 Low back pain: Secondary | ICD-10-CM | POA: Diagnosis not present

## 2018-11-21 DIAGNOSIS — R54 Age-related physical debility: Secondary | ICD-10-CM | POA: Diagnosis not present

## 2018-11-21 DIAGNOSIS — G8911 Acute pain due to trauma: Secondary | ICD-10-CM | POA: Diagnosis not present

## 2018-11-22 ENCOUNTER — Encounter: Payer: Medicare Other | Admitting: Internal Medicine

## 2018-11-22 ENCOUNTER — Telehealth: Payer: Self-pay

## 2018-11-22 NOTE — Telephone Encounter (Signed)
Copied from [place] [phone]. Topic: General - Other >> Nov 22, 2018 12:10 PM Alanda Slim E wrote: Reason for CRM: Pt's Son(Scott Caliendo) called and stated that Pt is being released from [facility] today and they need his FL2 Form showing that he is classified as a skilled nursing Pt. They have programs that they need the paperwork for today. He wants to know if it can be emailed or if he needs to pick the form up/ please call and advise

## 2018-11-23 NOTE — Telephone Encounter (Signed)
Son is aware.  FL2 form faxed and confirmed to [phone] per son's request.

## 2018-11-23 NOTE — Telephone Encounter (Signed)
FL2 filled. Please let son know that [facility] should provide [place] with an updated medication list to adequately fill it out. Meds are complete to the best of my knowledge.

## 2018-11-24 DIAGNOSIS — E039 Hypothyroidism, unspecified: Secondary | ICD-10-CM | POA: Diagnosis not present

## 2018-11-24 DIAGNOSIS — M545 Low back pain: Secondary | ICD-10-CM | POA: Diagnosis not present

## 2018-11-24 DIAGNOSIS — I872 Venous insufficiency (chronic) (peripheral): Secondary | ICD-10-CM | POA: Diagnosis not present

## 2018-11-24 DIAGNOSIS — Z85828 Personal history of other malignant neoplasm of skin: Secondary | ICD-10-CM | POA: Diagnosis not present

## 2018-11-24 DIAGNOSIS — Z7982 Long term (current) use of aspirin: Secondary | ICD-10-CM | POA: Diagnosis not present

## 2018-11-24 DIAGNOSIS — Z6825 Body mass index (BMI) 25.0-25.9, adult: Secondary | ICD-10-CM | POA: Diagnosis not present

## 2018-11-24 DIAGNOSIS — N184 Chronic kidney disease, stage 4 (severe): Secondary | ICD-10-CM | POA: Diagnosis not present

## 2018-11-24 DIAGNOSIS — Z8614 Personal history of Methicillin resistant Staphylococcus aureus infection: Secondary | ICD-10-CM | POA: Diagnosis not present

## 2018-11-24 DIAGNOSIS — G8929 Other chronic pain: Secondary | ICD-10-CM | POA: Diagnosis not present

## 2018-11-24 DIAGNOSIS — R262 Difficulty in walking, not elsewhere classified: Secondary | ICD-10-CM | POA: Diagnosis not present

## 2018-11-24 DIAGNOSIS — F419 Anxiety disorder, unspecified: Secondary | ICD-10-CM | POA: Diagnosis not present

## 2018-11-24 DIAGNOSIS — I509 Heart failure, unspecified: Secondary | ICD-10-CM | POA: Diagnosis not present

## 2018-11-24 DIAGNOSIS — N401 Enlarged prostate with lower urinary tract symptoms: Secondary | ICD-10-CM | POA: Diagnosis not present

## 2018-11-24 DIAGNOSIS — M6281 Muscle weakness (generalized): Secondary | ICD-10-CM | POA: Diagnosis not present

## 2018-11-24 DIAGNOSIS — M80052D Age-related osteoporosis with current pathological fracture, left femur, subsequent encounter for fracture with routine healing: Secondary | ICD-10-CM | POA: Diagnosis not present

## 2018-11-24 DIAGNOSIS — I130 Hypertensive heart and chronic kidney disease with heart failure and stage 1 through stage 4 chronic kidney disease, or unspecified chronic kidney disease: Secondary | ICD-10-CM | POA: Diagnosis not present

## 2018-11-24 DIAGNOSIS — E440 Moderate protein-calorie malnutrition: Secondary | ICD-10-CM | POA: Diagnosis not present

## 2018-11-24 DIAGNOSIS — Z9181 History of falling: Secondary | ICD-10-CM | POA: Diagnosis not present

## 2018-11-24 DIAGNOSIS — F0390 Unspecified dementia without behavioral disturbance: Secondary | ICD-10-CM | POA: Diagnosis not present

## 2018-11-25 ENCOUNTER — Telehealth: Payer: Self-pay | Admitting: Internal Medicine

## 2018-11-25 DIAGNOSIS — N184 Chronic kidney disease, stage 4 (severe): Secondary | ICD-10-CM | POA: Diagnosis not present

## 2018-11-25 DIAGNOSIS — E039 Hypothyroidism, unspecified: Secondary | ICD-10-CM | POA: Diagnosis not present

## 2018-11-25 DIAGNOSIS — M80052D Age-related osteoporosis with current pathological fracture, left femur, subsequent encounter for fracture with routine healing: Secondary | ICD-10-CM | POA: Diagnosis not present

## 2018-11-25 DIAGNOSIS — F0390 Unspecified dementia without behavioral disturbance: Secondary | ICD-10-CM | POA: Diagnosis not present

## 2018-11-25 DIAGNOSIS — I130 Hypertensive heart and chronic kidney disease with heart failure and stage 1 through stage 4 chronic kidney disease, or unspecified chronic kidney disease: Secondary | ICD-10-CM | POA: Diagnosis not present

## 2018-11-25 DIAGNOSIS — I509 Heart failure, unspecified: Secondary | ICD-10-CM | POA: Diagnosis not present

## 2018-11-25 NOTE — Telephone Encounter (Signed)
Copied from [place] [phone]. Topic: Quick Communication - Home Health Verbal Orders >> Nov 25, 2018 12:15 PM Sheran Luz wrote: Caller/Agency: [facility]  Callback Number: [phone] Requesting OT Frequency: 2x a week for 1 week; 1x a week for 2 weeks.

## 2018-11-25 NOTE — Telephone Encounter (Signed)
ok 

## 2018-11-25 NOTE — Telephone Encounter (Signed)
Left message on machine for Will with verbal orders for OT

## 2018-11-28 DIAGNOSIS — E039 Hypothyroidism, unspecified: Secondary | ICD-10-CM | POA: Diagnosis not present

## 2018-11-28 DIAGNOSIS — F0390 Unspecified dementia without behavioral disturbance: Secondary | ICD-10-CM | POA: Diagnosis not present

## 2018-11-28 DIAGNOSIS — M80052D Age-related osteoporosis with current pathological fracture, left femur, subsequent encounter for fracture with routine healing: Secondary | ICD-10-CM | POA: Diagnosis not present

## 2018-11-28 DIAGNOSIS — I509 Heart failure, unspecified: Secondary | ICD-10-CM | POA: Diagnosis not present

## 2018-11-28 DIAGNOSIS — I130 Hypertensive heart and chronic kidney disease with heart failure and stage 1 through stage 4 chronic kidney disease, or unspecified chronic kidney disease: Secondary | ICD-10-CM | POA: Diagnosis not present

## 2018-11-28 DIAGNOSIS — N184 Chronic kidney disease, stage 4 (severe): Secondary | ICD-10-CM | POA: Diagnosis not present

## 2018-11-29 DIAGNOSIS — F0390 Unspecified dementia without behavioral disturbance: Secondary | ICD-10-CM | POA: Diagnosis not present

## 2018-11-29 DIAGNOSIS — I130 Hypertensive heart and chronic kidney disease with heart failure and stage 1 through stage 4 chronic kidney disease, or unspecified chronic kidney disease: Secondary | ICD-10-CM | POA: Diagnosis not present

## 2018-11-29 DIAGNOSIS — E039 Hypothyroidism, unspecified: Secondary | ICD-10-CM | POA: Diagnosis not present

## 2018-11-29 DIAGNOSIS — N184 Chronic kidney disease, stage 4 (severe): Secondary | ICD-10-CM | POA: Diagnosis not present

## 2018-11-29 DIAGNOSIS — I509 Heart failure, unspecified: Secondary | ICD-10-CM | POA: Diagnosis not present

## 2018-11-29 DIAGNOSIS — M80052D Age-related osteoporosis with current pathological fracture, left femur, subsequent encounter for fracture with routine healing: Secondary | ICD-10-CM | POA: Diagnosis not present

## 2018-11-30 DIAGNOSIS — I130 Hypertensive heart and chronic kidney disease with heart failure and stage 1 through stage 4 chronic kidney disease, or unspecified chronic kidney disease: Secondary | ICD-10-CM | POA: Diagnosis not present

## 2018-11-30 DIAGNOSIS — I509 Heart failure, unspecified: Secondary | ICD-10-CM | POA: Diagnosis not present

## 2018-11-30 DIAGNOSIS — E039 Hypothyroidism, unspecified: Secondary | ICD-10-CM | POA: Diagnosis not present

## 2018-11-30 DIAGNOSIS — M80052D Age-related osteoporosis with current pathological fracture, left femur, subsequent encounter for fracture with routine healing: Secondary | ICD-10-CM | POA: Diagnosis not present

## 2018-11-30 DIAGNOSIS — F0390 Unspecified dementia without behavioral disturbance: Secondary | ICD-10-CM | POA: Diagnosis not present

## 2018-11-30 DIAGNOSIS — N184 Chronic kidney disease, stage 4 (severe): Secondary | ICD-10-CM | POA: Diagnosis not present

## 2018-12-01 DIAGNOSIS — I509 Heart failure, unspecified: Secondary | ICD-10-CM | POA: Diagnosis not present

## 2018-12-01 DIAGNOSIS — M80052D Age-related osteoporosis with current pathological fracture, left femur, subsequent encounter for fracture with routine healing: Secondary | ICD-10-CM | POA: Diagnosis not present

## 2018-12-01 DIAGNOSIS — E039 Hypothyroidism, unspecified: Secondary | ICD-10-CM | POA: Diagnosis not present

## 2018-12-01 DIAGNOSIS — N184 Chronic kidney disease, stage 4 (severe): Secondary | ICD-10-CM | POA: Diagnosis not present

## 2018-12-01 DIAGNOSIS — I130 Hypertensive heart and chronic kidney disease with heart failure and stage 1 through stage 4 chronic kidney disease, or unspecified chronic kidney disease: Secondary | ICD-10-CM | POA: Diagnosis not present

## 2018-12-01 DIAGNOSIS — F0390 Unspecified dementia without behavioral disturbance: Secondary | ICD-10-CM | POA: Diagnosis not present

## 2018-12-02 DIAGNOSIS — I509 Heart failure, unspecified: Secondary | ICD-10-CM | POA: Diagnosis not present

## 2018-12-02 DIAGNOSIS — F0390 Unspecified dementia without behavioral disturbance: Secondary | ICD-10-CM | POA: Diagnosis not present

## 2018-12-02 DIAGNOSIS — I130 Hypertensive heart and chronic kidney disease with heart failure and stage 1 through stage 4 chronic kidney disease, or unspecified chronic kidney disease: Secondary | ICD-10-CM | POA: Diagnosis not present

## 2018-12-02 DIAGNOSIS — N184 Chronic kidney disease, stage 4 (severe): Secondary | ICD-10-CM | POA: Diagnosis not present

## 2018-12-02 DIAGNOSIS — M80052D Age-related osteoporosis with current pathological fracture, left femur, subsequent encounter for fracture with routine healing: Secondary | ICD-10-CM | POA: Diagnosis not present

## 2018-12-02 DIAGNOSIS — E039 Hypothyroidism, unspecified: Secondary | ICD-10-CM | POA: Diagnosis not present

## 2018-12-05 ENCOUNTER — Telehealth: Payer: Self-pay | Admitting: Internal Medicine

## 2018-12-05 DIAGNOSIS — M80052D Age-related osteoporosis with current pathological fracture, left femur, subsequent encounter for fracture with routine healing: Secondary | ICD-10-CM | POA: Diagnosis not present

## 2018-12-05 DIAGNOSIS — I509 Heart failure, unspecified: Secondary | ICD-10-CM | POA: Diagnosis not present

## 2018-12-05 DIAGNOSIS — E039 Hypothyroidism, unspecified: Secondary | ICD-10-CM | POA: Diagnosis not present

## 2018-12-05 DIAGNOSIS — I130 Hypertensive heart and chronic kidney disease with heart failure and stage 1 through stage 4 chronic kidney disease, or unspecified chronic kidney disease: Secondary | ICD-10-CM | POA: Diagnosis not present

## 2018-12-05 DIAGNOSIS — N184 Chronic kidney disease, stage 4 (severe): Secondary | ICD-10-CM | POA: Diagnosis not present

## 2018-12-05 DIAGNOSIS — F0390 Unspecified dementia without behavioral disturbance: Secondary | ICD-10-CM | POA: Diagnosis not present

## 2018-12-05 NOTE — Telephone Encounter (Signed)
Copied from [place] [phone]. Topic: Quick Communication - See Telephone Encounter >> Dec 05, 2018  3:19 PM Blase Mess A wrote: CRM for notification. See Telephone encounter for: 12/05/18.  Wayne [name] calling is from [facility] Patient is doing very well would like to decrease frequency to  1 time a week for 3 week Verbal ok [phone]

## 2018-12-06 DIAGNOSIS — E039 Hypothyroidism, unspecified: Secondary | ICD-10-CM | POA: Diagnosis not present

## 2018-12-06 DIAGNOSIS — I130 Hypertensive heart and chronic kidney disease with heart failure and stage 1 through stage 4 chronic kidney disease, or unspecified chronic kidney disease: Secondary | ICD-10-CM | POA: Diagnosis not present

## 2018-12-06 DIAGNOSIS — N184 Chronic kidney disease, stage 4 (severe): Secondary | ICD-10-CM | POA: Diagnosis not present

## 2018-12-06 DIAGNOSIS — M80052D Age-related osteoporosis with current pathological fracture, left femur, subsequent encounter for fracture with routine healing: Secondary | ICD-10-CM | POA: Diagnosis not present

## 2018-12-06 DIAGNOSIS — F0390 Unspecified dementia without behavioral disturbance: Secondary | ICD-10-CM | POA: Diagnosis not present

## 2018-12-06 DIAGNOSIS — I509 Heart failure, unspecified: Secondary | ICD-10-CM | POA: Diagnosis not present

## 2018-12-06 NOTE — Telephone Encounter (Signed)
Verbal orders given to Pete 

## 2018-12-06 NOTE — Telephone Encounter (Signed)
Ok with me 

## 2018-12-07 DIAGNOSIS — S72142D Displaced intertrochanteric fracture of left femur, subsequent encounter for closed fracture with routine healing: Secondary | ICD-10-CM | POA: Diagnosis not present

## 2018-12-09 DIAGNOSIS — E039 Hypothyroidism, unspecified: Secondary | ICD-10-CM | POA: Diagnosis not present

## 2018-12-09 DIAGNOSIS — I130 Hypertensive heart and chronic kidney disease with heart failure and stage 1 through stage 4 chronic kidney disease, or unspecified chronic kidney disease: Secondary | ICD-10-CM | POA: Diagnosis not present

## 2018-12-09 DIAGNOSIS — M80052D Age-related osteoporosis with current pathological fracture, left femur, subsequent encounter for fracture with routine healing: Secondary | ICD-10-CM | POA: Diagnosis not present

## 2018-12-09 DIAGNOSIS — N184 Chronic kidney disease, stage 4 (severe): Secondary | ICD-10-CM | POA: Diagnosis not present

## 2018-12-09 DIAGNOSIS — I509 Heart failure, unspecified: Secondary | ICD-10-CM | POA: Diagnosis not present

## 2018-12-09 DIAGNOSIS — F0390 Unspecified dementia without behavioral disturbance: Secondary | ICD-10-CM | POA: Diagnosis not present

## 2018-12-13 ENCOUNTER — Telehealth: Payer: Self-pay | Admitting: Internal Medicine

## 2018-12-13 ENCOUNTER — Other Ambulatory Visit: Payer: Self-pay | Admitting: *Deleted

## 2018-12-13 DIAGNOSIS — M80052D Age-related osteoporosis with current pathological fracture, left femur, subsequent encounter for fracture with routine healing: Secondary | ICD-10-CM | POA: Diagnosis not present

## 2018-12-13 DIAGNOSIS — E7800 Pure hypercholesterolemia, unspecified: Secondary | ICD-10-CM

## 2018-12-13 DIAGNOSIS — F0390 Unspecified dementia without behavioral disturbance: Secondary | ICD-10-CM | POA: Diagnosis not present

## 2018-12-13 DIAGNOSIS — E039 Hypothyroidism, unspecified: Secondary | ICD-10-CM | POA: Diagnosis not present

## 2018-12-13 DIAGNOSIS — I509 Heart failure, unspecified: Secondary | ICD-10-CM | POA: Diagnosis not present

## 2018-12-13 DIAGNOSIS — N184 Chronic kidney disease, stage 4 (severe): Secondary | ICD-10-CM | POA: Diagnosis not present

## 2018-12-13 DIAGNOSIS — I130 Hypertensive heart and chronic kidney disease with heart failure and stage 1 through stage 4 chronic kidney disease, or unspecified chronic kidney disease: Secondary | ICD-10-CM | POA: Diagnosis not present

## 2018-12-13 MED ORDER — SIMVASTATIN 40 MG PO TABS: ORAL_TABLET | ORAL | 1 refills | Status: DC

## 2018-12-13 NOTE — Telephone Encounter (Signed)
Copied from [place] [phone]. Topic: Quick Communication - Home Health Verbal Orders >> Dec 13, 2018  5:01 PM Alanda Slim E wrote: Caller/Agency: [facility] Callback Number: [phone] Requesting OT  Frequency: extend OT by 1x a week for 2 weeks

## 2018-12-14 ENCOUNTER — Telehealth: Payer: Self-pay | Admitting: Internal Medicine

## 2018-12-14 DIAGNOSIS — I509 Heart failure, unspecified: Secondary | ICD-10-CM | POA: Diagnosis not present

## 2018-12-14 DIAGNOSIS — E039 Hypothyroidism, unspecified: Secondary | ICD-10-CM | POA: Diagnosis not present

## 2018-12-14 DIAGNOSIS — F0390 Unspecified dementia without behavioral disturbance: Secondary | ICD-10-CM | POA: Diagnosis not present

## 2018-12-14 DIAGNOSIS — N184 Chronic kidney disease, stage 4 (severe): Secondary | ICD-10-CM | POA: Diagnosis not present

## 2018-12-14 DIAGNOSIS — I130 Hypertensive heart and chronic kidney disease with heart failure and stage 1 through stage 4 chronic kidney disease, or unspecified chronic kidney disease: Secondary | ICD-10-CM | POA: Diagnosis not present

## 2018-12-14 DIAGNOSIS — M80052D Age-related osteoporosis with current pathological fracture, left femur, subsequent encounter for fracture with routine healing: Secondary | ICD-10-CM | POA: Diagnosis not present

## 2018-12-14 NOTE — Telephone Encounter (Signed)
Ok with me 

## 2018-12-14 NOTE — Telephone Encounter (Signed)
Sorry to hear that.

## 2018-12-14 NOTE — Telephone Encounter (Signed)
Verbal orders for OT given to Wayne [name]

## 2018-12-14 NOTE — Telephone Encounter (Signed)
Copied from [place] [phone]. Topic: General - Other >> Dec 14, 2018  9:05 AM Lennox Solders wrote: Reason for CRM: Collier Salina PT with [facility] is calling to report pt had a fall today. Pt tripped over his rug. Pt does not have any injuries

## 2018-12-19 DIAGNOSIS — E039 Hypothyroidism, unspecified: Secondary | ICD-10-CM | POA: Diagnosis not present

## 2018-12-19 DIAGNOSIS — I130 Hypertensive heart and chronic kidney disease with heart failure and stage 1 through stage 4 chronic kidney disease, or unspecified chronic kidney disease: Secondary | ICD-10-CM | POA: Diagnosis not present

## 2018-12-19 DIAGNOSIS — I509 Heart failure, unspecified: Secondary | ICD-10-CM | POA: Diagnosis not present

## 2018-12-19 DIAGNOSIS — F0390 Unspecified dementia without behavioral disturbance: Secondary | ICD-10-CM | POA: Diagnosis not present

## 2018-12-19 DIAGNOSIS — N184 Chronic kidney disease, stage 4 (severe): Secondary | ICD-10-CM | POA: Diagnosis not present

## 2018-12-19 DIAGNOSIS — M80052D Age-related osteoporosis with current pathological fracture, left femur, subsequent encounter for fracture with routine healing: Secondary | ICD-10-CM | POA: Diagnosis not present

## 2018-12-21 ENCOUNTER — Telehealth: Payer: Self-pay | Admitting: Internal Medicine

## 2018-12-21 DIAGNOSIS — E039 Hypothyroidism, unspecified: Secondary | ICD-10-CM | POA: Diagnosis not present

## 2018-12-21 DIAGNOSIS — N184 Chronic kidney disease, stage 4 (severe): Secondary | ICD-10-CM | POA: Diagnosis not present

## 2018-12-21 DIAGNOSIS — I509 Heart failure, unspecified: Secondary | ICD-10-CM | POA: Diagnosis not present

## 2018-12-21 DIAGNOSIS — M80052D Age-related osteoporosis with current pathological fracture, left femur, subsequent encounter for fracture with routine healing: Secondary | ICD-10-CM | POA: Diagnosis not present

## 2018-12-21 DIAGNOSIS — F0390 Unspecified dementia without behavioral disturbance: Secondary | ICD-10-CM | POA: Diagnosis not present

## 2018-12-21 DIAGNOSIS — I130 Hypertensive heart and chronic kidney disease with heart failure and stage 1 through stage 4 chronic kidney disease, or unspecified chronic kidney disease: Secondary | ICD-10-CM | POA: Diagnosis not present

## 2018-12-21 NOTE — Telephone Encounter (Signed)
Ok with me 

## 2018-12-21 NOTE — Telephone Encounter (Signed)
Copied from [place] [id number]. Topic: Quick Communication - Home Health Verbal Orders >> Dec 21, 2018  3:53 PM Rutherford Nail, [place] wrote: Caller/Agency: Laurey Arrow, physical therapist, with [facility] [place] Number: [phone] Requesting OT/PT/Skilled Nursing/Social Work/Speech Therapy: Physical Therapy Frequency:  States that the patient was going to be discharged from PT services, but would like to get approval to extend for a couple more weeks. Please advise.

## 2018-12-22 DIAGNOSIS — M80052D Age-related osteoporosis with current pathological fracture, left femur, subsequent encounter for fracture with routine healing: Secondary | ICD-10-CM | POA: Diagnosis not present

## 2018-12-22 DIAGNOSIS — I130 Hypertensive heart and chronic kidney disease with heart failure and stage 1 through stage 4 chronic kidney disease, or unspecified chronic kidney disease: Secondary | ICD-10-CM | POA: Diagnosis not present

## 2018-12-22 DIAGNOSIS — N184 Chronic kidney disease, stage 4 (severe): Secondary | ICD-10-CM | POA: Diagnosis not present

## 2018-12-22 DIAGNOSIS — F0390 Unspecified dementia without behavioral disturbance: Secondary | ICD-10-CM | POA: Diagnosis not present

## 2018-12-22 DIAGNOSIS — I509 Heart failure, unspecified: Secondary | ICD-10-CM | POA: Diagnosis not present

## 2018-12-22 DIAGNOSIS — E039 Hypothyroidism, unspecified: Secondary | ICD-10-CM | POA: Diagnosis not present

## 2018-12-22 NOTE — Telephone Encounter (Signed)
Verbal orders given to [facility] for PT

## 2018-12-24 DIAGNOSIS — Z9181 History of falling: Secondary | ICD-10-CM | POA: Diagnosis not present

## 2018-12-24 DIAGNOSIS — E440 Moderate protein-calorie malnutrition: Secondary | ICD-10-CM | POA: Diagnosis not present

## 2018-12-24 DIAGNOSIS — N401 Enlarged prostate with lower urinary tract symptoms: Secondary | ICD-10-CM | POA: Diagnosis not present

## 2018-12-24 DIAGNOSIS — Z85828 Personal history of other malignant neoplasm of skin: Secondary | ICD-10-CM | POA: Diagnosis not present

## 2018-12-24 DIAGNOSIS — M80052D Age-related osteoporosis with current pathological fracture, left femur, subsequent encounter for fracture with routine healing: Secondary | ICD-10-CM | POA: Diagnosis not present

## 2018-12-24 DIAGNOSIS — Z6825 Body mass index (BMI) 25.0-25.9, adult: Secondary | ICD-10-CM | POA: Diagnosis not present

## 2018-12-24 DIAGNOSIS — E039 Hypothyroidism, unspecified: Secondary | ICD-10-CM | POA: Diagnosis not present

## 2018-12-24 DIAGNOSIS — Z8614 Personal history of Methicillin resistant Staphylococcus aureus infection: Secondary | ICD-10-CM | POA: Diagnosis not present

## 2018-12-24 DIAGNOSIS — G8929 Other chronic pain: Secondary | ICD-10-CM | POA: Diagnosis not present

## 2018-12-24 DIAGNOSIS — R262 Difficulty in walking, not elsewhere classified: Secondary | ICD-10-CM | POA: Diagnosis not present

## 2018-12-24 DIAGNOSIS — I509 Heart failure, unspecified: Secondary | ICD-10-CM | POA: Diagnosis not present

## 2018-12-24 DIAGNOSIS — Z7982 Long term (current) use of aspirin: Secondary | ICD-10-CM | POA: Diagnosis not present

## 2018-12-24 DIAGNOSIS — I130 Hypertensive heart and chronic kidney disease with heart failure and stage 1 through stage 4 chronic kidney disease, or unspecified chronic kidney disease: Secondary | ICD-10-CM | POA: Diagnosis not present

## 2018-12-24 DIAGNOSIS — N184 Chronic kidney disease, stage 4 (severe): Secondary | ICD-10-CM | POA: Diagnosis not present

## 2018-12-24 DIAGNOSIS — M545 Low back pain: Secondary | ICD-10-CM | POA: Diagnosis not present

## 2018-12-24 DIAGNOSIS — I872 Venous insufficiency (chronic) (peripheral): Secondary | ICD-10-CM | POA: Diagnosis not present

## 2018-12-24 DIAGNOSIS — F419 Anxiety disorder, unspecified: Secondary | ICD-10-CM | POA: Diagnosis not present

## 2018-12-24 DIAGNOSIS — M6281 Muscle weakness (generalized): Secondary | ICD-10-CM | POA: Diagnosis not present

## 2018-12-24 DIAGNOSIS — F0390 Unspecified dementia without behavioral disturbance: Secondary | ICD-10-CM | POA: Diagnosis not present

## 2018-12-28 DIAGNOSIS — E039 Hypothyroidism, unspecified: Secondary | ICD-10-CM | POA: Diagnosis not present

## 2018-12-28 DIAGNOSIS — I130 Hypertensive heart and chronic kidney disease with heart failure and stage 1 through stage 4 chronic kidney disease, or unspecified chronic kidney disease: Secondary | ICD-10-CM | POA: Diagnosis not present

## 2018-12-28 DIAGNOSIS — I509 Heart failure, unspecified: Secondary | ICD-10-CM | POA: Diagnosis not present

## 2018-12-28 DIAGNOSIS — F0390 Unspecified dementia without behavioral disturbance: Secondary | ICD-10-CM | POA: Diagnosis not present

## 2018-12-28 DIAGNOSIS — M80052D Age-related osteoporosis with current pathological fracture, left femur, subsequent encounter for fracture with routine healing: Secondary | ICD-10-CM | POA: Diagnosis not present

## 2018-12-28 DIAGNOSIS — N184 Chronic kidney disease, stage 4 (severe): Secondary | ICD-10-CM | POA: Diagnosis not present

## 2018-12-30 DIAGNOSIS — I130 Hypertensive heart and chronic kidney disease with heart failure and stage 1 through stage 4 chronic kidney disease, or unspecified chronic kidney disease: Secondary | ICD-10-CM | POA: Diagnosis not present

## 2018-12-30 DIAGNOSIS — M80052D Age-related osteoporosis with current pathological fracture, left femur, subsequent encounter for fracture with routine healing: Secondary | ICD-10-CM | POA: Diagnosis not present

## 2018-12-30 DIAGNOSIS — F0390 Unspecified dementia without behavioral disturbance: Secondary | ICD-10-CM | POA: Diagnosis not present

## 2018-12-30 DIAGNOSIS — E039 Hypothyroidism, unspecified: Secondary | ICD-10-CM | POA: Diagnosis not present

## 2018-12-30 DIAGNOSIS — N184 Chronic kidney disease, stage 4 (severe): Secondary | ICD-10-CM | POA: Diagnosis not present

## 2018-12-30 DIAGNOSIS — I509 Heart failure, unspecified: Secondary | ICD-10-CM | POA: Diagnosis not present

## 2019-01-03 DIAGNOSIS — E039 Hypothyroidism, unspecified: Secondary | ICD-10-CM | POA: Diagnosis not present

## 2019-01-03 DIAGNOSIS — N184 Chronic kidney disease, stage 4 (severe): Secondary | ICD-10-CM | POA: Diagnosis not present

## 2019-01-03 DIAGNOSIS — I130 Hypertensive heart and chronic kidney disease with heart failure and stage 1 through stage 4 chronic kidney disease, or unspecified chronic kidney disease: Secondary | ICD-10-CM | POA: Diagnosis not present

## 2019-01-03 DIAGNOSIS — F0390 Unspecified dementia without behavioral disturbance: Secondary | ICD-10-CM | POA: Diagnosis not present

## 2019-01-03 DIAGNOSIS — I509 Heart failure, unspecified: Secondary | ICD-10-CM | POA: Diagnosis not present

## 2019-01-03 DIAGNOSIS — M80052D Age-related osteoporosis with current pathological fracture, left femur, subsequent encounter for fracture with routine healing: Secondary | ICD-10-CM | POA: Diagnosis not present

## 2019-01-05 ENCOUNTER — Other Ambulatory Visit: Payer: Self-pay | Admitting: *Deleted

## 2019-01-05 MED ORDER — DONEPEZIL HCL 5 MG PO TABS: ORAL_TABLET | ORAL | 1 refills | Status: DC

## 2019-01-05 MED ORDER — ATENOLOL 50 MG PO TABS: 50.0000 mg | ORAL_TABLET | Freq: Every day | ORAL | 1 refills | Status: DC

## 2019-01-30 ENCOUNTER — Other Ambulatory Visit: Payer: Self-pay | Admitting: *Deleted

## 2019-01-30 MED ORDER — TORSEMIDE 20 MG PO TABS: 20.0000 mg | ORAL_TABLET | Freq: Every day | ORAL | 1 refills | Status: DC

## 2019-02-09 ENCOUNTER — Other Ambulatory Visit: Payer: Self-pay | Admitting: *Deleted

## 2019-02-09 DIAGNOSIS — F5101 Primary insomnia: Secondary | ICD-10-CM

## 2019-02-09 MED ORDER — LORAZEPAM 0.5 MG PO TABS: 0.5000 mg | ORAL_TABLET | Freq: Every evening | ORAL | 0 refills | Status: DC | PRN

## 2019-04-25 ENCOUNTER — Telehealth: Payer: Self-pay | Admitting: Internal Medicine

## 2019-04-25 NOTE — Telephone Encounter (Signed)
Tim called to ask if Dr. Jerilee Hoh will remain the attending physician for hospice / please advise

## 2019-04-25 NOTE — Telephone Encounter (Signed)
Esther Hardy with [facility] called and would like a verbal order for Home Hospice Evaluation for patient. He can be reached at [phone]. Please call Tim back, thanks.

## 2019-04-25 NOTE — Telephone Encounter (Signed)
Yes, agree with hospice

## 2019-04-25 NOTE — Telephone Encounter (Signed)
Left message on machine for Tim with verbal orders for Hospice evaluation.

## 2019-04-26 NOTE — Telephone Encounter (Signed)
Yes, I can be the hospice MD

## 2019-04-26 NOTE — Telephone Encounter (Signed)
Spoke with Tim.

## 2019-05-04 DIAGNOSIS — I872 Venous insufficiency (chronic) (peripheral): Secondary | ICD-10-CM | POA: Diagnosis not present

## 2019-05-04 DIAGNOSIS — I10 Essential (primary) hypertension: Secondary | ICD-10-CM | POA: Diagnosis not present

## 2019-05-04 DIAGNOSIS — E785 Hyperlipidemia, unspecified: Secondary | ICD-10-CM | POA: Diagnosis not present

## 2019-05-04 DIAGNOSIS — M545 Low back pain: Secondary | ICD-10-CM | POA: Diagnosis not present

## 2019-05-04 DIAGNOSIS — I119 Hypertensive heart disease without heart failure: Secondary | ICD-10-CM | POA: Diagnosis not present

## 2019-05-04 DIAGNOSIS — R609 Edema, unspecified: Secondary | ICD-10-CM | POA: Diagnosis not present

## 2019-05-04 DIAGNOSIS — G300 Alzheimer's disease with early onset: Secondary | ICD-10-CM | POA: Diagnosis not present

## 2019-05-04 DIAGNOSIS — N189 Chronic kidney disease, unspecified: Secondary | ICD-10-CM | POA: Diagnosis not present

## 2019-05-04 DIAGNOSIS — Z515 Encounter for palliative care: Secondary | ICD-10-CM | POA: Diagnosis not present

## 2019-05-05 DIAGNOSIS — G300 Alzheimer's disease with early onset: Secondary | ICD-10-CM | POA: Diagnosis not present

## 2019-05-05 DIAGNOSIS — M545 Low back pain: Secondary | ICD-10-CM | POA: Diagnosis not present

## 2019-05-05 DIAGNOSIS — I119 Hypertensive heart disease without heart failure: Secondary | ICD-10-CM | POA: Diagnosis not present

## 2019-05-05 DIAGNOSIS — I872 Venous insufficiency (chronic) (peripheral): Secondary | ICD-10-CM | POA: Diagnosis not present

## 2019-05-05 DIAGNOSIS — E785 Hyperlipidemia, unspecified: Secondary | ICD-10-CM | POA: Diagnosis not present

## 2019-05-05 DIAGNOSIS — I10 Essential (primary) hypertension: Secondary | ICD-10-CM | POA: Diagnosis not present

## 2019-05-08 DIAGNOSIS — M545 Low back pain: Secondary | ICD-10-CM | POA: Diagnosis not present

## 2019-05-08 DIAGNOSIS — I872 Venous insufficiency (chronic) (peripheral): Secondary | ICD-10-CM | POA: Diagnosis not present

## 2019-05-08 DIAGNOSIS — I10 Essential (primary) hypertension: Secondary | ICD-10-CM | POA: Diagnosis not present

## 2019-05-08 DIAGNOSIS — E785 Hyperlipidemia, unspecified: Secondary | ICD-10-CM | POA: Diagnosis not present

## 2019-05-08 DIAGNOSIS — G300 Alzheimer's disease with early onset: Secondary | ICD-10-CM | POA: Diagnosis not present

## 2019-05-08 DIAGNOSIS — I119 Hypertensive heart disease without heart failure: Secondary | ICD-10-CM | POA: Diagnosis not present

## 2019-05-13 DIAGNOSIS — G300 Alzheimer's disease with early onset: Secondary | ICD-10-CM | POA: Diagnosis not present

## 2019-05-13 DIAGNOSIS — I872 Venous insufficiency (chronic) (peripheral): Secondary | ICD-10-CM | POA: Diagnosis not present

## 2019-05-13 DIAGNOSIS — I119 Hypertensive heart disease without heart failure: Secondary | ICD-10-CM | POA: Diagnosis not present

## 2019-05-13 DIAGNOSIS — Z515 Encounter for palliative care: Secondary | ICD-10-CM | POA: Diagnosis not present

## 2019-05-13 DIAGNOSIS — R609 Edema, unspecified: Secondary | ICD-10-CM | POA: Diagnosis not present

## 2019-05-13 DIAGNOSIS — I10 Essential (primary) hypertension: Secondary | ICD-10-CM | POA: Diagnosis not present

## 2019-05-13 DIAGNOSIS — M545 Low back pain: Secondary | ICD-10-CM | POA: Diagnosis not present

## 2019-05-13 DIAGNOSIS — E785 Hyperlipidemia, unspecified: Secondary | ICD-10-CM | POA: Diagnosis not present

## 2019-05-13 DIAGNOSIS — N189 Chronic kidney disease, unspecified: Secondary | ICD-10-CM | POA: Diagnosis not present

## 2019-05-15 ENCOUNTER — Other Ambulatory Visit: Payer: Self-pay

## 2019-05-17 DIAGNOSIS — N35112 Postinfective bulbous urethral stricture, not elsewhere classified: Secondary | ICD-10-CM | POA: Diagnosis not present

## 2019-05-17 DIAGNOSIS — N390 Urinary tract infection, site not specified: Secondary | ICD-10-CM | POA: Diagnosis not present

## 2019-05-18 DIAGNOSIS — I119 Hypertensive heart disease without heart failure: Secondary | ICD-10-CM | POA: Diagnosis not present

## 2019-05-18 DIAGNOSIS — I872 Venous insufficiency (chronic) (peripheral): Secondary | ICD-10-CM | POA: Diagnosis not present

## 2019-05-18 DIAGNOSIS — I10 Essential (primary) hypertension: Secondary | ICD-10-CM | POA: Diagnosis not present

## 2019-05-18 DIAGNOSIS — M545 Low back pain: Secondary | ICD-10-CM | POA: Diagnosis not present

## 2019-05-18 DIAGNOSIS — E785 Hyperlipidemia, unspecified: Secondary | ICD-10-CM | POA: Diagnosis not present

## 2019-05-18 DIAGNOSIS — G300 Alzheimer's disease with early onset: Secondary | ICD-10-CM | POA: Diagnosis not present

## 2019-05-19 DIAGNOSIS — I872 Venous insufficiency (chronic) (peripheral): Secondary | ICD-10-CM | POA: Diagnosis not present

## 2019-05-19 DIAGNOSIS — I119 Hypertensive heart disease without heart failure: Secondary | ICD-10-CM | POA: Diagnosis not present

## 2019-05-19 DIAGNOSIS — E785 Hyperlipidemia, unspecified: Secondary | ICD-10-CM | POA: Diagnosis not present

## 2019-05-19 DIAGNOSIS — G300 Alzheimer's disease with early onset: Secondary | ICD-10-CM | POA: Diagnosis not present

## 2019-05-19 DIAGNOSIS — I10 Essential (primary) hypertension: Secondary | ICD-10-CM | POA: Diagnosis not present

## 2019-05-19 DIAGNOSIS — M545 Low back pain: Secondary | ICD-10-CM | POA: Diagnosis not present

## 2019-05-31 DIAGNOSIS — G300 Alzheimer's disease with early onset: Secondary | ICD-10-CM | POA: Diagnosis not present

## 2019-05-31 DIAGNOSIS — E785 Hyperlipidemia, unspecified: Secondary | ICD-10-CM | POA: Diagnosis not present

## 2019-05-31 DIAGNOSIS — I10 Essential (primary) hypertension: Secondary | ICD-10-CM | POA: Diagnosis not present

## 2019-05-31 DIAGNOSIS — M545 Low back pain: Secondary | ICD-10-CM | POA: Diagnosis not present

## 2019-05-31 DIAGNOSIS — I872 Venous insufficiency (chronic) (peripheral): Secondary | ICD-10-CM | POA: Diagnosis not present

## 2019-05-31 DIAGNOSIS — I119 Hypertensive heart disease without heart failure: Secondary | ICD-10-CM | POA: Diagnosis not present

## 2019-06-03 DIAGNOSIS — I119 Hypertensive heart disease without heart failure: Secondary | ICD-10-CM | POA: Diagnosis not present

## 2019-06-03 DIAGNOSIS — E785 Hyperlipidemia, unspecified: Secondary | ICD-10-CM | POA: Diagnosis not present

## 2019-06-03 DIAGNOSIS — M545 Low back pain: Secondary | ICD-10-CM | POA: Diagnosis not present

## 2019-06-03 DIAGNOSIS — I872 Venous insufficiency (chronic) (peripheral): Secondary | ICD-10-CM | POA: Diagnosis not present

## 2019-06-03 DIAGNOSIS — G300 Alzheimer's disease with early onset: Secondary | ICD-10-CM | POA: Diagnosis not present

## 2019-06-03 DIAGNOSIS — I10 Essential (primary) hypertension: Secondary | ICD-10-CM | POA: Diagnosis not present

## 2019-06-08 ENCOUNTER — Other Ambulatory Visit: Payer: Self-pay | Admitting: Internal Medicine

## 2019-06-08 DIAGNOSIS — I119 Hypertensive heart disease without heart failure: Secondary | ICD-10-CM | POA: Diagnosis not present

## 2019-06-08 DIAGNOSIS — I872 Venous insufficiency (chronic) (peripheral): Secondary | ICD-10-CM | POA: Diagnosis not present

## 2019-06-08 DIAGNOSIS — E785 Hyperlipidemia, unspecified: Secondary | ICD-10-CM | POA: Diagnosis not present

## 2019-06-08 DIAGNOSIS — G300 Alzheimer's disease with early onset: Secondary | ICD-10-CM | POA: Diagnosis not present

## 2019-06-08 DIAGNOSIS — M545 Low back pain: Secondary | ICD-10-CM | POA: Diagnosis not present

## 2019-06-08 DIAGNOSIS — I10 Essential (primary) hypertension: Secondary | ICD-10-CM | POA: Diagnosis not present

## 2019-06-08 DIAGNOSIS — E7800 Pure hypercholesterolemia, unspecified: Secondary | ICD-10-CM

## 2019-06-13 DIAGNOSIS — I10 Essential (primary) hypertension: Secondary | ICD-10-CM | POA: Diagnosis not present

## 2019-06-13 DIAGNOSIS — N189 Chronic kidney disease, unspecified: Secondary | ICD-10-CM | POA: Diagnosis not present

## 2019-06-13 DIAGNOSIS — G300 Alzheimer's disease with early onset: Secondary | ICD-10-CM | POA: Diagnosis not present

## 2019-06-13 DIAGNOSIS — I872 Venous insufficiency (chronic) (peripheral): Secondary | ICD-10-CM | POA: Diagnosis not present

## 2019-06-13 DIAGNOSIS — R609 Edema, unspecified: Secondary | ICD-10-CM | POA: Diagnosis not present

## 2019-06-13 DIAGNOSIS — E785 Hyperlipidemia, unspecified: Secondary | ICD-10-CM | POA: Diagnosis not present

## 2019-06-13 DIAGNOSIS — Z515 Encounter for palliative care: Secondary | ICD-10-CM | POA: Diagnosis not present

## 2019-06-13 DIAGNOSIS — M545 Low back pain: Secondary | ICD-10-CM | POA: Diagnosis not present

## 2019-06-13 DIAGNOSIS — I119 Hypertensive heart disease without heart failure: Secondary | ICD-10-CM | POA: Diagnosis not present

## 2019-06-15 DIAGNOSIS — I119 Hypertensive heart disease without heart failure: Secondary | ICD-10-CM | POA: Diagnosis not present

## 2019-06-15 DIAGNOSIS — M545 Low back pain: Secondary | ICD-10-CM | POA: Diagnosis not present

## 2019-06-15 DIAGNOSIS — I10 Essential (primary) hypertension: Secondary | ICD-10-CM | POA: Diagnosis not present

## 2019-06-15 DIAGNOSIS — I872 Venous insufficiency (chronic) (peripheral): Secondary | ICD-10-CM | POA: Diagnosis not present

## 2019-06-15 DIAGNOSIS — E785 Hyperlipidemia, unspecified: Secondary | ICD-10-CM | POA: Diagnosis not present

## 2019-06-15 DIAGNOSIS — G300 Alzheimer's disease with early onset: Secondary | ICD-10-CM | POA: Diagnosis not present

## 2019-06-22 DIAGNOSIS — I119 Hypertensive heart disease without heart failure: Secondary | ICD-10-CM | POA: Diagnosis not present

## 2019-06-22 DIAGNOSIS — I10 Essential (primary) hypertension: Secondary | ICD-10-CM | POA: Diagnosis not present

## 2019-06-22 DIAGNOSIS — G300 Alzheimer's disease with early onset: Secondary | ICD-10-CM | POA: Diagnosis not present

## 2019-06-22 DIAGNOSIS — M545 Low back pain: Secondary | ICD-10-CM | POA: Diagnosis not present

## 2019-06-22 DIAGNOSIS — I872 Venous insufficiency (chronic) (peripheral): Secondary | ICD-10-CM | POA: Diagnosis not present

## 2019-06-22 DIAGNOSIS — E785 Hyperlipidemia, unspecified: Secondary | ICD-10-CM | POA: Diagnosis not present

## 2019-06-23 DIAGNOSIS — G300 Alzheimer's disease with early onset: Secondary | ICD-10-CM | POA: Diagnosis not present

## 2019-06-23 DIAGNOSIS — E785 Hyperlipidemia, unspecified: Secondary | ICD-10-CM | POA: Diagnosis not present

## 2019-06-23 DIAGNOSIS — I872 Venous insufficiency (chronic) (peripheral): Secondary | ICD-10-CM | POA: Diagnosis not present

## 2019-06-23 DIAGNOSIS — M545 Low back pain: Secondary | ICD-10-CM | POA: Diagnosis not present

## 2019-06-23 DIAGNOSIS — I119 Hypertensive heart disease without heart failure: Secondary | ICD-10-CM | POA: Diagnosis not present

## 2019-06-23 DIAGNOSIS — I10 Essential (primary) hypertension: Secondary | ICD-10-CM | POA: Diagnosis not present

## 2019-06-29 DIAGNOSIS — I119 Hypertensive heart disease without heart failure: Secondary | ICD-10-CM | POA: Diagnosis not present

## 2019-06-29 DIAGNOSIS — I10 Essential (primary) hypertension: Secondary | ICD-10-CM | POA: Diagnosis not present

## 2019-06-29 DIAGNOSIS — I872 Venous insufficiency (chronic) (peripheral): Secondary | ICD-10-CM | POA: Diagnosis not present

## 2019-06-29 DIAGNOSIS — M545 Low back pain: Secondary | ICD-10-CM | POA: Diagnosis not present

## 2019-06-29 DIAGNOSIS — G300 Alzheimer's disease with early onset: Secondary | ICD-10-CM | POA: Diagnosis not present

## 2019-06-29 DIAGNOSIS — E785 Hyperlipidemia, unspecified: Secondary | ICD-10-CM | POA: Diagnosis not present

## 2019-06-30 ENCOUNTER — Telehealth: Payer: Self-pay | Admitting: *Deleted

## 2019-06-30 NOTE — Telephone Encounter (Signed)
Copied from [place] [phone]. Topic: General - Other >> Jun 30, 2019 10:08 AM Keene Breath wrote: Reason for CRM: Patient's son called to ask the best way for him to get an FL2 form filled out as soon as possible.  Son requested an email address.  Please call him back to discuss.  CB# [phone]

## 2019-06-30 NOTE — Telephone Encounter (Signed)
Completed and emailed to son's e-mail per his request

## 2019-06-30 NOTE — Telephone Encounter (Signed)
Is he going into SNF? Ask him to send form to office and we can fill.

## 2019-06-30 NOTE — Telephone Encounter (Signed)
Spoke with son and patient will be going to SNF. FL2 given to Dr Jerilee Hoh

## 2019-07-03 DIAGNOSIS — G300 Alzheimer's disease with early onset: Secondary | ICD-10-CM | POA: Diagnosis not present

## 2019-07-03 DIAGNOSIS — E785 Hyperlipidemia, unspecified: Secondary | ICD-10-CM | POA: Diagnosis not present

## 2019-07-03 DIAGNOSIS — I119 Hypertensive heart disease without heart failure: Secondary | ICD-10-CM | POA: Diagnosis not present

## 2019-07-03 DIAGNOSIS — M545 Low back pain: Secondary | ICD-10-CM | POA: Diagnosis not present

## 2019-07-03 DIAGNOSIS — I10 Essential (primary) hypertension: Secondary | ICD-10-CM | POA: Diagnosis not present

## 2019-07-03 DIAGNOSIS — I872 Venous insufficiency (chronic) (peripheral): Secondary | ICD-10-CM | POA: Diagnosis not present

## 2019-07-05 DIAGNOSIS — E785 Hyperlipidemia, unspecified: Secondary | ICD-10-CM | POA: Diagnosis not present

## 2019-07-05 DIAGNOSIS — I10 Essential (primary) hypertension: Secondary | ICD-10-CM | POA: Diagnosis not present

## 2019-07-05 DIAGNOSIS — G300 Alzheimer's disease with early onset: Secondary | ICD-10-CM | POA: Diagnosis not present

## 2019-07-05 DIAGNOSIS — M545 Low back pain: Secondary | ICD-10-CM | POA: Diagnosis not present

## 2019-07-05 DIAGNOSIS — I119 Hypertensive heart disease without heart failure: Secondary | ICD-10-CM | POA: Diagnosis not present

## 2019-07-05 DIAGNOSIS — I872 Venous insufficiency (chronic) (peripheral): Secondary | ICD-10-CM | POA: Diagnosis not present

## 2019-07-06 DIAGNOSIS — I872 Venous insufficiency (chronic) (peripheral): Secondary | ICD-10-CM | POA: Diagnosis not present

## 2019-07-06 DIAGNOSIS — G300 Alzheimer's disease with early onset: Secondary | ICD-10-CM | POA: Diagnosis not present

## 2019-07-06 DIAGNOSIS — E785 Hyperlipidemia, unspecified: Secondary | ICD-10-CM | POA: Diagnosis not present

## 2019-07-06 DIAGNOSIS — I10 Essential (primary) hypertension: Secondary | ICD-10-CM | POA: Diagnosis not present

## 2019-07-06 DIAGNOSIS — I119 Hypertensive heart disease without heart failure: Secondary | ICD-10-CM | POA: Diagnosis not present

## 2019-07-06 DIAGNOSIS — M545 Low back pain: Secondary | ICD-10-CM | POA: Diagnosis not present

## 2019-07-10 ENCOUNTER — Other Ambulatory Visit: Payer: Self-pay | Admitting: Internal Medicine

## 2019-07-11 DIAGNOSIS — I119 Hypertensive heart disease without heart failure: Secondary | ICD-10-CM | POA: Diagnosis not present

## 2019-07-11 DIAGNOSIS — E785 Hyperlipidemia, unspecified: Secondary | ICD-10-CM | POA: Diagnosis not present

## 2019-07-11 DIAGNOSIS — G300 Alzheimer's disease with early onset: Secondary | ICD-10-CM | POA: Diagnosis not present

## 2019-07-11 DIAGNOSIS — I872 Venous insufficiency (chronic) (peripheral): Secondary | ICD-10-CM | POA: Diagnosis not present

## 2019-07-11 DIAGNOSIS — I10 Essential (primary) hypertension: Secondary | ICD-10-CM | POA: Diagnosis not present

## 2019-07-11 DIAGNOSIS — M545 Low back pain: Secondary | ICD-10-CM | POA: Diagnosis not present

## 2019-07-12 DIAGNOSIS — E785 Hyperlipidemia, unspecified: Secondary | ICD-10-CM | POA: Diagnosis not present

## 2019-07-12 DIAGNOSIS — M545 Low back pain: Secondary | ICD-10-CM | POA: Diagnosis not present

## 2019-07-12 DIAGNOSIS — I119 Hypertensive heart disease without heart failure: Secondary | ICD-10-CM | POA: Diagnosis not present

## 2019-07-12 DIAGNOSIS — I10 Essential (primary) hypertension: Secondary | ICD-10-CM | POA: Diagnosis not present

## 2019-07-12 DIAGNOSIS — I872 Venous insufficiency (chronic) (peripheral): Secondary | ICD-10-CM | POA: Diagnosis not present

## 2019-07-12 DIAGNOSIS — G300 Alzheimer's disease with early onset: Secondary | ICD-10-CM | POA: Diagnosis not present

## 2019-07-13 DIAGNOSIS — R609 Edema, unspecified: Secondary | ICD-10-CM | POA: Diagnosis not present

## 2019-07-13 DIAGNOSIS — I10 Essential (primary) hypertension: Secondary | ICD-10-CM | POA: Diagnosis not present

## 2019-07-13 DIAGNOSIS — M545 Low back pain: Secondary | ICD-10-CM | POA: Diagnosis not present

## 2019-07-13 DIAGNOSIS — Z515 Encounter for palliative care: Secondary | ICD-10-CM | POA: Diagnosis not present

## 2019-07-13 DIAGNOSIS — E785 Hyperlipidemia, unspecified: Secondary | ICD-10-CM | POA: Diagnosis not present

## 2019-07-13 DIAGNOSIS — I872 Venous insufficiency (chronic) (peripheral): Secondary | ICD-10-CM | POA: Diagnosis not present

## 2019-07-13 DIAGNOSIS — I119 Hypertensive heart disease without heart failure: Secondary | ICD-10-CM | POA: Diagnosis not present

## 2019-07-13 DIAGNOSIS — G300 Alzheimer's disease with early onset: Secondary | ICD-10-CM | POA: Diagnosis not present

## 2019-07-13 DIAGNOSIS — N189 Chronic kidney disease, unspecified: Secondary | ICD-10-CM | POA: Diagnosis not present

## 2019-07-19 ENCOUNTER — Other Ambulatory Visit: Payer: Self-pay | Admitting: Internal Medicine

## 2019-07-19 DIAGNOSIS — G300 Alzheimer's disease with early onset: Secondary | ICD-10-CM | POA: Diagnosis not present

## 2019-07-19 DIAGNOSIS — M545 Low back pain: Secondary | ICD-10-CM | POA: Diagnosis not present

## 2019-07-19 DIAGNOSIS — I872 Venous insufficiency (chronic) (peripheral): Secondary | ICD-10-CM | POA: Diagnosis not present

## 2019-07-19 DIAGNOSIS — I119 Hypertensive heart disease without heart failure: Secondary | ICD-10-CM | POA: Diagnosis not present

## 2019-07-19 DIAGNOSIS — I10 Essential (primary) hypertension: Secondary | ICD-10-CM | POA: Diagnosis not present

## 2019-07-19 DIAGNOSIS — E785 Hyperlipidemia, unspecified: Secondary | ICD-10-CM | POA: Diagnosis not present

## 2019-07-25 DIAGNOSIS — E785 Hyperlipidemia, unspecified: Secondary | ICD-10-CM | POA: Diagnosis not present

## 2019-07-25 DIAGNOSIS — G300 Alzheimer's disease with early onset: Secondary | ICD-10-CM | POA: Diagnosis not present

## 2019-07-25 DIAGNOSIS — I119 Hypertensive heart disease without heart failure: Secondary | ICD-10-CM | POA: Diagnosis not present

## 2019-07-25 DIAGNOSIS — I10 Essential (primary) hypertension: Secondary | ICD-10-CM | POA: Diagnosis not present

## 2019-07-25 DIAGNOSIS — I872 Venous insufficiency (chronic) (peripheral): Secondary | ICD-10-CM | POA: Diagnosis not present

## 2019-07-25 DIAGNOSIS — M545 Low back pain: Secondary | ICD-10-CM | POA: Diagnosis not present

## 2019-07-26 ENCOUNTER — Other Ambulatory Visit: Payer: Self-pay | Admitting: Internal Medicine

## 2019-07-26 DIAGNOSIS — M545 Low back pain: Secondary | ICD-10-CM | POA: Diagnosis not present

## 2019-07-26 DIAGNOSIS — I872 Venous insufficiency (chronic) (peripheral): Secondary | ICD-10-CM | POA: Diagnosis not present

## 2019-07-26 DIAGNOSIS — E785 Hyperlipidemia, unspecified: Secondary | ICD-10-CM | POA: Diagnosis not present

## 2019-07-26 DIAGNOSIS — I119 Hypertensive heart disease without heart failure: Secondary | ICD-10-CM | POA: Diagnosis not present

## 2019-07-26 DIAGNOSIS — G300 Alzheimer's disease with early onset: Secondary | ICD-10-CM | POA: Diagnosis not present

## 2019-07-26 DIAGNOSIS — I10 Essential (primary) hypertension: Secondary | ICD-10-CM | POA: Diagnosis not present

## 2019-07-31 DIAGNOSIS — M545 Low back pain: Secondary | ICD-10-CM | POA: Diagnosis not present

## 2019-07-31 DIAGNOSIS — I119 Hypertensive heart disease without heart failure: Secondary | ICD-10-CM | POA: Diagnosis not present

## 2019-07-31 DIAGNOSIS — I872 Venous insufficiency (chronic) (peripheral): Secondary | ICD-10-CM | POA: Diagnosis not present

## 2019-07-31 DIAGNOSIS — I10 Essential (primary) hypertension: Secondary | ICD-10-CM | POA: Diagnosis not present

## 2019-07-31 DIAGNOSIS — G300 Alzheimer's disease with early onset: Secondary | ICD-10-CM | POA: Diagnosis not present

## 2019-07-31 DIAGNOSIS — E785 Hyperlipidemia, unspecified: Secondary | ICD-10-CM | POA: Diagnosis not present

## 2019-08-01 ENCOUNTER — Other Ambulatory Visit: Payer: Self-pay | Admitting: Internal Medicine

## 2019-08-01 DIAGNOSIS — G300 Alzheimer's disease with early onset: Secondary | ICD-10-CM | POA: Diagnosis not present

## 2019-08-01 DIAGNOSIS — I10 Essential (primary) hypertension: Secondary | ICD-10-CM | POA: Diagnosis not present

## 2019-08-01 DIAGNOSIS — E7800 Pure hypercholesterolemia, unspecified: Secondary | ICD-10-CM

## 2019-08-01 DIAGNOSIS — M545 Low back pain: Secondary | ICD-10-CM | POA: Diagnosis not present

## 2019-08-01 DIAGNOSIS — E785 Hyperlipidemia, unspecified: Secondary | ICD-10-CM | POA: Diagnosis not present

## 2019-08-01 DIAGNOSIS — I872 Venous insufficiency (chronic) (peripheral): Secondary | ICD-10-CM | POA: Diagnosis not present

## 2019-08-01 DIAGNOSIS — I119 Hypertensive heart disease without heart failure: Secondary | ICD-10-CM | POA: Diagnosis not present

## 2019-08-04 DIAGNOSIS — M545 Low back pain: Secondary | ICD-10-CM | POA: Diagnosis not present

## 2019-08-04 DIAGNOSIS — E785 Hyperlipidemia, unspecified: Secondary | ICD-10-CM | POA: Diagnosis not present

## 2019-08-04 DIAGNOSIS — I10 Essential (primary) hypertension: Secondary | ICD-10-CM | POA: Diagnosis not present

## 2019-08-04 DIAGNOSIS — I119 Hypertensive heart disease without heart failure: Secondary | ICD-10-CM | POA: Diagnosis not present

## 2019-08-04 DIAGNOSIS — G300 Alzheimer's disease with early onset: Secondary | ICD-10-CM | POA: Diagnosis not present

## 2019-08-04 DIAGNOSIS — I872 Venous insufficiency (chronic) (peripheral): Secondary | ICD-10-CM | POA: Diagnosis not present

## 2019-08-09 DIAGNOSIS — I872 Venous insufficiency (chronic) (peripheral): Secondary | ICD-10-CM | POA: Diagnosis not present

## 2019-08-09 DIAGNOSIS — E785 Hyperlipidemia, unspecified: Secondary | ICD-10-CM | POA: Diagnosis not present

## 2019-08-09 DIAGNOSIS — I10 Essential (primary) hypertension: Secondary | ICD-10-CM | POA: Diagnosis not present

## 2019-08-09 DIAGNOSIS — I119 Hypertensive heart disease without heart failure: Secondary | ICD-10-CM | POA: Diagnosis not present

## 2019-08-09 DIAGNOSIS — M545 Low back pain: Secondary | ICD-10-CM | POA: Diagnosis not present

## 2019-08-09 DIAGNOSIS — G300 Alzheimer's disease with early onset: Secondary | ICD-10-CM | POA: Diagnosis not present

## 2019-08-13 DIAGNOSIS — Z515 Encounter for palliative care: Secondary | ICD-10-CM | POA: Diagnosis not present

## 2019-08-13 DIAGNOSIS — R609 Edema, unspecified: Secondary | ICD-10-CM | POA: Diagnosis not present

## 2019-08-13 DIAGNOSIS — E785 Hyperlipidemia, unspecified: Secondary | ICD-10-CM | POA: Diagnosis not present

## 2019-08-13 DIAGNOSIS — I119 Hypertensive heart disease without heart failure: Secondary | ICD-10-CM | POA: Diagnosis not present

## 2019-08-13 DIAGNOSIS — I872 Venous insufficiency (chronic) (peripheral): Secondary | ICD-10-CM | POA: Diagnosis not present

## 2019-08-13 DIAGNOSIS — M545 Low back pain: Secondary | ICD-10-CM | POA: Diagnosis not present

## 2019-08-13 DIAGNOSIS — G300 Alzheimer's disease with early onset: Secondary | ICD-10-CM | POA: Diagnosis not present

## 2019-08-13 DIAGNOSIS — N189 Chronic kidney disease, unspecified: Secondary | ICD-10-CM | POA: Diagnosis not present

## 2019-08-13 DIAGNOSIS — I10 Essential (primary) hypertension: Secondary | ICD-10-CM | POA: Diagnosis not present

## 2019-08-14 DIAGNOSIS — I10 Essential (primary) hypertension: Secondary | ICD-10-CM | POA: Diagnosis not present

## 2019-08-14 DIAGNOSIS — I119 Hypertensive heart disease without heart failure: Secondary | ICD-10-CM | POA: Diagnosis not present

## 2019-08-14 DIAGNOSIS — G300 Alzheimer's disease with early onset: Secondary | ICD-10-CM | POA: Diagnosis not present

## 2019-08-14 DIAGNOSIS — I872 Venous insufficiency (chronic) (peripheral): Secondary | ICD-10-CM | POA: Diagnosis not present

## 2019-08-14 DIAGNOSIS — E785 Hyperlipidemia, unspecified: Secondary | ICD-10-CM | POA: Diagnosis not present

## 2019-08-14 DIAGNOSIS — M545 Low back pain: Secondary | ICD-10-CM | POA: Diagnosis not present

## 2019-08-18 DIAGNOSIS — I10 Essential (primary) hypertension: Secondary | ICD-10-CM | POA: Diagnosis not present

## 2019-08-18 DIAGNOSIS — M545 Low back pain: Secondary | ICD-10-CM | POA: Diagnosis not present

## 2019-08-18 DIAGNOSIS — I119 Hypertensive heart disease without heart failure: Secondary | ICD-10-CM | POA: Diagnosis not present

## 2019-08-18 DIAGNOSIS — E785 Hyperlipidemia, unspecified: Secondary | ICD-10-CM | POA: Diagnosis not present

## 2019-08-18 DIAGNOSIS — I872 Venous insufficiency (chronic) (peripheral): Secondary | ICD-10-CM | POA: Diagnosis not present

## 2019-08-18 DIAGNOSIS — G300 Alzheimer's disease with early onset: Secondary | ICD-10-CM | POA: Diagnosis not present

## 2019-08-23 DIAGNOSIS — E785 Hyperlipidemia, unspecified: Secondary | ICD-10-CM | POA: Diagnosis not present

## 2019-08-23 DIAGNOSIS — G300 Alzheimer's disease with early onset: Secondary | ICD-10-CM | POA: Diagnosis not present

## 2019-08-23 DIAGNOSIS — I119 Hypertensive heart disease without heart failure: Secondary | ICD-10-CM | POA: Diagnosis not present

## 2019-08-23 DIAGNOSIS — I872 Venous insufficiency (chronic) (peripheral): Secondary | ICD-10-CM | POA: Diagnosis not present

## 2019-08-23 DIAGNOSIS — M545 Low back pain: Secondary | ICD-10-CM | POA: Diagnosis not present

## 2019-08-23 DIAGNOSIS — I10 Essential (primary) hypertension: Secondary | ICD-10-CM | POA: Diagnosis not present

## 2019-08-28 DIAGNOSIS — I119 Hypertensive heart disease without heart failure: Secondary | ICD-10-CM | POA: Diagnosis not present

## 2019-08-28 DIAGNOSIS — E785 Hyperlipidemia, unspecified: Secondary | ICD-10-CM | POA: Diagnosis not present

## 2019-08-28 DIAGNOSIS — I872 Venous insufficiency (chronic) (peripheral): Secondary | ICD-10-CM | POA: Diagnosis not present

## 2019-08-28 DIAGNOSIS — M545 Low back pain: Secondary | ICD-10-CM | POA: Diagnosis not present

## 2019-08-28 DIAGNOSIS — G300 Alzheimer's disease with early onset: Secondary | ICD-10-CM | POA: Diagnosis not present

## 2019-08-28 DIAGNOSIS — I10 Essential (primary) hypertension: Secondary | ICD-10-CM | POA: Diagnosis not present

## 2019-08-30 DIAGNOSIS — G300 Alzheimer's disease with early onset: Secondary | ICD-10-CM | POA: Diagnosis not present

## 2019-08-30 DIAGNOSIS — I10 Essential (primary) hypertension: Secondary | ICD-10-CM | POA: Diagnosis not present

## 2019-08-30 DIAGNOSIS — E785 Hyperlipidemia, unspecified: Secondary | ICD-10-CM | POA: Diagnosis not present

## 2019-08-30 DIAGNOSIS — M545 Low back pain: Secondary | ICD-10-CM | POA: Diagnosis not present

## 2019-08-30 DIAGNOSIS — I872 Venous insufficiency (chronic) (peripheral): Secondary | ICD-10-CM | POA: Diagnosis not present

## 2019-08-30 DIAGNOSIS — I119 Hypertensive heart disease without heart failure: Secondary | ICD-10-CM | POA: Diagnosis not present

## 2019-09-06 DIAGNOSIS — I10 Essential (primary) hypertension: Secondary | ICD-10-CM | POA: Diagnosis not present

## 2019-09-06 DIAGNOSIS — M545 Low back pain: Secondary | ICD-10-CM | POA: Diagnosis not present

## 2019-09-06 DIAGNOSIS — E785 Hyperlipidemia, unspecified: Secondary | ICD-10-CM | POA: Diagnosis not present

## 2019-09-06 DIAGNOSIS — I872 Venous insufficiency (chronic) (peripheral): Secondary | ICD-10-CM | POA: Diagnosis not present

## 2019-09-06 DIAGNOSIS — G300 Alzheimer's disease with early onset: Secondary | ICD-10-CM | POA: Diagnosis not present

## 2019-09-06 DIAGNOSIS — I119 Hypertensive heart disease without heart failure: Secondary | ICD-10-CM | POA: Diagnosis not present

## 2019-09-11 DIAGNOSIS — E785 Hyperlipidemia, unspecified: Secondary | ICD-10-CM | POA: Diagnosis not present

## 2019-09-11 DIAGNOSIS — G300 Alzheimer's disease with early onset: Secondary | ICD-10-CM | POA: Diagnosis not present

## 2019-09-11 DIAGNOSIS — I119 Hypertensive heart disease without heart failure: Secondary | ICD-10-CM | POA: Diagnosis not present

## 2019-09-11 DIAGNOSIS — M545 Low back pain: Secondary | ICD-10-CM | POA: Diagnosis not present

## 2019-09-11 DIAGNOSIS — I10 Essential (primary) hypertension: Secondary | ICD-10-CM | POA: Diagnosis not present

## 2019-09-11 DIAGNOSIS — I872 Venous insufficiency (chronic) (peripheral): Secondary | ICD-10-CM | POA: Diagnosis not present

## 2019-09-21 ENCOUNTER — Telehealth: Payer: Self-pay | Admitting: Internal Medicine

## 2019-09-21 NOTE — Telephone Encounter (Signed)
Ok for orders? 

## 2019-09-21 NOTE — Telephone Encounter (Signed)
See Note

## 2019-09-21 NOTE — Telephone Encounter (Signed)
Caller name:  Nicki Reaper  Relation to pt: son  Call back number: [phone]    Reason for call:  Inquiring if a home health  nurse can visit patient at home to do a TB and 48 hour COVID test due to [place] [facility] requirement, please advise   FL2 forms needs to be updated reflctinng 30 day from admission which will be 12/28, please advise

## 2019-09-25 NOTE — Telephone Encounter (Signed)
Patient son Kainoa Swoboda called to check on message sent a few days ago he is awaiting an answer from Dr Jerilee Hoh asking for a call back ASAP. Need questions answered about Fl2 form and covid and TB skin test. Please call Scott at Ph# [phone]

## 2019-09-26 NOTE — Telephone Encounter (Signed)
Spoke with Octavia Bruckner and he will have the social worker complete the FL2 form.

## 2019-09-27 ENCOUNTER — Other Ambulatory Visit: Payer: Self-pay | Admitting: Internal Medicine

## 2019-09-27 NOTE — Telephone Encounter (Signed)
FYI Spoke with RN at [facility] and the patient has to been seen by a provider in the last 30 days for a FL2 form to be completed.

## 2019-09-28 ENCOUNTER — Telehealth: Payer: Self-pay | Admitting: *Deleted

## 2019-09-28 NOTE — Telephone Encounter (Signed)
Patient's son returned call to office. Son stated that Dr. Jerilee Hoh had already agreed to sign FL2 and they just need dates changed on form. Patient's son stated that they are out of town and cannot bring patient to see doctor and patient is supposed to move in on the 28th. Please give son a call back to advise. Jahaad Penado Ph# [phone]

## 2019-09-28 NOTE — Telephone Encounter (Signed)
Attempted to contact  patient's son. No answer. LVM for son to return call. CRM created.

## 2019-09-28 NOTE — Telephone Encounter (Signed)
Clinic RN spoke with son. Informed him that if he wants to Dr. Jerilee Hoh needs to see his father in person or virtually. The son scheduled a virtual visit for tomorrow at [place]  with PCP .

## 2019-09-29 ENCOUNTER — Telehealth: Payer: Self-pay | Admitting: Internal Medicine

## 2019-09-29 NOTE — Telephone Encounter (Signed)
Spoke with patient son. Informed him that the only day and time we can do this visit now is Tuesday at (12/22) at 11:30AM

## 2019-10-03 ENCOUNTER — Other Ambulatory Visit: Payer: Self-pay

## 2019-10-03 ENCOUNTER — Telehealth (INDEPENDENT_AMBULATORY_CARE_PROVIDER_SITE_OTHER): Admitting: Internal Medicine

## 2019-10-03 DIAGNOSIS — I10 Essential (primary) hypertension: Secondary | ICD-10-CM

## 2019-10-03 DIAGNOSIS — R413 Other amnesia: Secondary | ICD-10-CM | POA: Diagnosis not present

## 2019-10-03 DIAGNOSIS — E039 Hypothyroidism, unspecified: Secondary | ICD-10-CM

## 2019-10-03 DIAGNOSIS — E7800 Pure hypercholesterolemia, unspecified: Secondary | ICD-10-CM

## 2019-10-03 DIAGNOSIS — S72142G Displaced intertrochanteric fracture of left femur, subsequent encounter for closed fracture with delayed healing: Secondary | ICD-10-CM

## 2019-10-03 NOTE — Progress Notes (Signed)
Virtual Visit via Telephone Note  I connected with Wayne [name] on 10/03/19 at 11:30 AM EST by telephone and verified that I am speaking with the correct person using two identifiers.   I discussed the limitations, risks, security and privacy concerns of performing an evaluation and management service by telephone and the availability of in person appointments. I also discussed with the patient that there may be a patient responsible charge related to this service. The patient expressed understanding and agreed to proceed.  Location patient: home Location provider: work office Participants present for the call: patient, provider, son Wayne [name] Patient did not have a visit in the prior 7 days to address this/these issue(s).   History of Present Illness:  This visit has been scheduled by his son mainly to discuss FL2 form and SNF placement.  They have finally decided to pursue long-term SNF placement.  Ever since he fractured his hip last year they have been having issues.  He now needs a lot of care with bathing and dressing, can only walk very slowly with a walker.  Family does not have the resources to care for him 54.  He already has a spot at [facility] facility for early January.  They need my assistance filling out an FL 2 form.   Observations/Objective: Patient sounds cheerful and well on the phone. I do not appreciate any increased work of breathing. Speech and thought processing are grossly intact. Patient reported vitals: None reported   Current Outpatient Medications:  .  aspirin EC 81 MG tablet, Take 81 mg by mouth daily., Disp: , Rfl:  .  atenolol (TENORMIN) 50 MG tablet, TAKE 1 TABLET BY MOUTH EVERY DAY, Disp: 90 tablet, Rfl: 1 .  donepezil (ARICEPT) 5 MG tablet, TAKE 1 TABLET BY MOUTH EVERYDAY AT BEDTIME, Disp: 90 tablet, Rfl: 1 .  levothyroxine (SYNTHROID) 50 MCG tablet, TAKE 1 TABLET BY MOUTH DAILY BEFORE BREAKFAST, Disp: 90 tablet, Rfl: 1 .  LORazepam (ATIVAN) 0.5  MG tablet, Take 1 tablet (0.5 mg total) by mouth at bedtime as needed for sleep., Disp: 90 tablet, Rfl: 0 .  polyethylene glycol (MIRALAX / GLYCOLAX) packet, Take 17 g by mouth daily as needed for mild constipation., Disp: 14 each, Rfl: 0 .  senna-docusate (SENOKOT-S) 8.6-50 MG tablet, Take 1 tablet by mouth at bedtime as needed for mild constipation., Disp: , Rfl:  .  simvastatin (ZOCOR) 40 MG tablet, TAKE 1 TABLET BY MOUTH EVERY DAY AT NIGHT, Disp: 90 tablet, Rfl: 1 .  torsemide (DEMADEX) 20 MG tablet, TAKE 1 TABLET BY MOUTH EVERY DAY, Disp: 90 tablet, Rfl: 1  Review of Systems:  Constitutional: Denies fever, chills, diaphoresis, appetite change and fatigue.  HEENT: Denies photophobia, eye pain, redness, hearing loss, ear pain, congestion, sore throat, rhinorrhea, sneezing, mouth sores, trouble swallowing, neck pain, neck stiffness and tinnitus.   Respiratory: Denies SOB, DOE, cough, chest tightness,  and wheezing.   Cardiovascular: Denies chest pain, palpitations and leg swelling.  Gastrointestinal: Denies nausea, vomiting, abdominal pain, diarrhea, constipation, blood in stool and abdominal distention.  Genitourinary: Denies dysuria, urgency, frequency, hematuria, flank pain and difficulty urinating.  Endocrine: Denies: hot or cold intolerance, sweats, changes in hair or nails, polyuria, polydipsia. Musculoskeletal: Denies myalgias, [name] pain, joint swelling, arthralgias and gait problem.  Skin: Denies pallor, rash and wound.  Neurological: Denies dizziness, seizures, syncope, weakness, light-headedness, numbness and headaches.  Hematological: Denies adenopathy. Easy bruising, personal or family bleeding history  Psychiatric/Behavioral: Denies suicidal ideation,  mood changes, confusion, nervousness, sleep disturbance and agitation   Assessment and Plan:  Closed displaced intertrochanteric fracture of left femur with delayed healing, subsequent encounter  Short-term memory  loss  Essential hypertension  Pure hypercholesterolemia  Hypothyroidism, unspecified type  -FL 2 form will be signed. -Patient is overdue for in person assessment and blood work.  They will reschedule after the pandemic.  I discussed the assessment and treatment plan with the patient. The patient was provided an opportunity to ask questions and all were answered. The patient agreed with the plan and demonstrated an understanding of the instructions.   The patient was advised to call [name] or seek an in-person evaluation if the symptoms worsen or if the condition fails to improve as anticipated.  I provided 14 minutes of non-face-to-face time during this encounter.   Lelon Frohlich, MD [facility] at [facility]

## 2019-10-04 ENCOUNTER — Telehealth: Payer: Self-pay | Admitting: *Deleted

## 2019-10-04 NOTE — Telephone Encounter (Signed)
FL2 faxed and confirmed

## 2019-10-04 NOTE — Telephone Encounter (Signed)
Left message on machine for son to see if he would like to pick up the [facility] form or have it faxed.  Will need a fax number.   CRM

## 2019-10-13 DIAGNOSIS — I119 Hypertensive heart disease without heart failure: Secondary | ICD-10-CM | POA: Diagnosis not present

## 2019-10-13 DIAGNOSIS — M545 Low back pain: Secondary | ICD-10-CM | POA: Diagnosis not present

## 2019-10-13 DIAGNOSIS — R609 Edema, unspecified: Secondary | ICD-10-CM | POA: Diagnosis not present

## 2019-10-13 DIAGNOSIS — E785 Hyperlipidemia, unspecified: Secondary | ICD-10-CM | POA: Diagnosis not present

## 2019-10-13 DIAGNOSIS — Z515 Encounter for palliative care: Secondary | ICD-10-CM | POA: Diagnosis not present

## 2019-10-13 DIAGNOSIS — I10 Essential (primary) hypertension: Secondary | ICD-10-CM | POA: Diagnosis not present

## 2019-10-13 DIAGNOSIS — I872 Venous insufficiency (chronic) (peripheral): Secondary | ICD-10-CM | POA: Diagnosis not present

## 2019-10-13 DIAGNOSIS — G300 Alzheimer's disease with early onset: Secondary | ICD-10-CM | POA: Diagnosis not present

## 2019-10-13 DIAGNOSIS — N189 Chronic kidney disease, unspecified: Secondary | ICD-10-CM | POA: Diagnosis not present

## 2019-10-17 DIAGNOSIS — G300 Alzheimer's disease with early onset: Secondary | ICD-10-CM | POA: Diagnosis not present

## 2019-10-17 DIAGNOSIS — E785 Hyperlipidemia, unspecified: Secondary | ICD-10-CM | POA: Diagnosis not present

## 2019-10-17 DIAGNOSIS — I119 Hypertensive heart disease without heart failure: Secondary | ICD-10-CM | POA: Diagnosis not present

## 2019-10-17 DIAGNOSIS — I10 Essential (primary) hypertension: Secondary | ICD-10-CM | POA: Diagnosis not present

## 2019-10-17 DIAGNOSIS — M545 Low back pain: Secondary | ICD-10-CM | POA: Diagnosis not present

## 2019-10-17 DIAGNOSIS — I872 Venous insufficiency (chronic) (peripheral): Secondary | ICD-10-CM | POA: Diagnosis not present

## 2019-10-19 DIAGNOSIS — I10 Essential (primary) hypertension: Secondary | ICD-10-CM | POA: Diagnosis not present

## 2019-10-19 DIAGNOSIS — I119 Hypertensive heart disease without heart failure: Secondary | ICD-10-CM | POA: Diagnosis not present

## 2019-10-19 DIAGNOSIS — M545 Low back pain: Secondary | ICD-10-CM | POA: Diagnosis not present

## 2019-10-19 DIAGNOSIS — G300 Alzheimer's disease with early onset: Secondary | ICD-10-CM | POA: Diagnosis not present

## 2019-10-19 DIAGNOSIS — E785 Hyperlipidemia, unspecified: Secondary | ICD-10-CM | POA: Diagnosis not present

## 2019-10-19 DIAGNOSIS — I872 Venous insufficiency (chronic) (peripheral): Secondary | ICD-10-CM | POA: Diagnosis not present

## 2019-10-21 DIAGNOSIS — I872 Venous insufficiency (chronic) (peripheral): Secondary | ICD-10-CM | POA: Diagnosis not present

## 2019-10-21 DIAGNOSIS — I10 Essential (primary) hypertension: Secondary | ICD-10-CM | POA: Diagnosis not present

## 2019-10-21 DIAGNOSIS — E785 Hyperlipidemia, unspecified: Secondary | ICD-10-CM | POA: Diagnosis not present

## 2019-10-21 DIAGNOSIS — G300 Alzheimer's disease with early onset: Secondary | ICD-10-CM | POA: Diagnosis not present

## 2019-10-21 DIAGNOSIS — I119 Hypertensive heart disease without heart failure: Secondary | ICD-10-CM | POA: Diagnosis not present

## 2019-10-21 DIAGNOSIS — M545 Low back pain: Secondary | ICD-10-CM | POA: Diagnosis not present

## 2019-10-24 ENCOUNTER — Telehealth: Payer: Self-pay | Admitting: Internal Medicine

## 2019-10-24 ENCOUNTER — Other Ambulatory Visit: Payer: Medicare Other

## 2019-10-24 ENCOUNTER — Ambulatory Visit: Payer: Self-pay

## 2019-10-24 NOTE — Telephone Encounter (Signed)
Pt needs a covid test today in [place].  Family is trying to get him into a living facility by Thurs, and states they need test urgently.  No openings today.  Son hoping they can make exception and get pt an appt any time today.dr   Is that OK? His dr has refused to put in an order, saying she does not do that.  Please advise

## 2019-10-24 NOTE — Telephone Encounter (Signed)
Phone call to pt's son, Wayne [name].  Advised we can offer a work-in appt. at  [facility], today, at 3:00 PM.  Per son, pt. needs to be tested, prior to being admitted to a [facility] on Thursday.  Advised that the results can take approx. 2-3 days for turnaround time.  Appt. Scheduled at 3:00 PM @ [facility] site; advised to wear mask, remain in car, and follow direction of Security Guards.  Advised to bring photo ID and insurance card.  Son verb. Understanding.  Agreed with plan.

## 2019-10-27 DIAGNOSIS — E559 Vitamin D deficiency, unspecified: Secondary | ICD-10-CM | POA: Diagnosis not present

## 2019-10-27 DIAGNOSIS — R6889 Other general symptoms and signs: Secondary | ICD-10-CM | POA: Diagnosis not present

## 2019-10-27 DIAGNOSIS — R0602 Shortness of breath: Secondary | ICD-10-CM | POA: Diagnosis not present

## 2019-10-27 DIAGNOSIS — E039 Hypothyroidism, unspecified: Secondary | ICD-10-CM | POA: Diagnosis not present

## 2019-10-27 DIAGNOSIS — D649 Anemia, unspecified: Secondary | ICD-10-CM | POA: Diagnosis not present

## 2019-10-29 DIAGNOSIS — G309 Alzheimer's disease, unspecified: Secondary | ICD-10-CM | POA: Diagnosis not present

## 2019-10-29 DIAGNOSIS — E46 Unspecified protein-calorie malnutrition: Secondary | ICD-10-CM | POA: Diagnosis not present

## 2019-10-29 DIAGNOSIS — R2681 Unsteadiness on feet: Secondary | ICD-10-CM | POA: Diagnosis not present

## 2019-10-29 DIAGNOSIS — G9009 Other idiopathic peripheral autonomic neuropathy: Secondary | ICD-10-CM | POA: Diagnosis not present

## 2019-10-29 DIAGNOSIS — E039 Hypothyroidism, unspecified: Secondary | ICD-10-CM | POA: Diagnosis not present

## 2019-10-29 DIAGNOSIS — I10 Essential (primary) hypertension: Secondary | ICD-10-CM | POA: Diagnosis not present

## 2019-10-29 DIAGNOSIS — F4321 Adjustment disorder with depressed mood: Secondary | ICD-10-CM | POA: Diagnosis not present

## 2019-11-01 DIAGNOSIS — G3184 Mild cognitive impairment, so stated: Secondary | ICD-10-CM | POA: Diagnosis not present

## 2019-11-01 DIAGNOSIS — F341 Dysthymic disorder: Secondary | ICD-10-CM | POA: Diagnosis not present

## 2019-11-01 DIAGNOSIS — F4320 Adjustment disorder, unspecified: Secondary | ICD-10-CM | POA: Diagnosis not present

## 2019-11-06 DIAGNOSIS — Z23 Encounter for immunization: Secondary | ICD-10-CM | POA: Diagnosis not present

## 2019-11-15 DIAGNOSIS — F0390 Unspecified dementia without behavioral disturbance: Secondary | ICD-10-CM | POA: Diagnosis not present

## 2019-11-15 DIAGNOSIS — R633 Feeding difficulties: Secondary | ICD-10-CM | POA: Diagnosis not present

## 2019-11-15 DIAGNOSIS — F4320 Adjustment disorder, unspecified: Secondary | ICD-10-CM | POA: Diagnosis not present

## 2019-11-15 DIAGNOSIS — F329 Major depressive disorder, single episode, unspecified: Secondary | ICD-10-CM | POA: Diagnosis not present

## 2019-11-16 DIAGNOSIS — I890 Lymphedema, not elsewhere classified: Secondary | ICD-10-CM | POA: Diagnosis not present

## 2019-11-16 DIAGNOSIS — I739 Peripheral vascular disease, unspecified: Secondary | ICD-10-CM | POA: Diagnosis not present

## 2019-11-16 DIAGNOSIS — L602 Onychogryphosis: Secondary | ICD-10-CM | POA: Diagnosis not present

## 2019-11-22 ENCOUNTER — Ambulatory Visit: Payer: BLUE CROSS/BLUE SHIELD | Admitting: Internal Medicine

## 2019-11-27 DIAGNOSIS — E039 Hypothyroidism, unspecified: Secondary | ICD-10-CM | POA: Diagnosis not present

## 2019-11-27 DIAGNOSIS — Z79899 Other long term (current) drug therapy: Secondary | ICD-10-CM | POA: Diagnosis not present

## 2019-12-04 DIAGNOSIS — Z23 Encounter for immunization: Secondary | ICD-10-CM | POA: Diagnosis not present

## 2019-12-06 DIAGNOSIS — R633 Feeding difficulties: Secondary | ICD-10-CM | POA: Diagnosis not present

## 2019-12-06 DIAGNOSIS — F0390 Unspecified dementia without behavioral disturbance: Secondary | ICD-10-CM | POA: Diagnosis not present

## 2019-12-06 DIAGNOSIS — F329 Major depressive disorder, single episode, unspecified: Secondary | ICD-10-CM | POA: Diagnosis not present

## 2019-12-06 DIAGNOSIS — F4320 Adjustment disorder, unspecified: Secondary | ICD-10-CM | POA: Diagnosis not present

## 2019-12-12 DIAGNOSIS — R0602 Shortness of breath: Secondary | ICD-10-CM | POA: Diagnosis not present

## 2019-12-12 DIAGNOSIS — R6889 Other general symptoms and signs: Secondary | ICD-10-CM | POA: Diagnosis not present

## 2019-12-12 DIAGNOSIS — D649 Anemia, unspecified: Secondary | ICD-10-CM | POA: Diagnosis not present

## 2019-12-12 DIAGNOSIS — Z79899 Other long term (current) drug therapy: Secondary | ICD-10-CM | POA: Diagnosis not present

## 2019-12-26 DIAGNOSIS — E039 Hypothyroidism, unspecified: Secondary | ICD-10-CM | POA: Diagnosis not present

## 2019-12-26 DIAGNOSIS — Z79899 Other long term (current) drug therapy: Secondary | ICD-10-CM | POA: Diagnosis not present

## 2020-01-03 DIAGNOSIS — R630 Anorexia: Secondary | ICD-10-CM | POA: Diagnosis not present

## 2020-01-03 DIAGNOSIS — F329 Major depressive disorder, single episode, unspecified: Secondary | ICD-10-CM | POA: Diagnosis not present

## 2020-01-03 DIAGNOSIS — F4320 Adjustment disorder, unspecified: Secondary | ICD-10-CM | POA: Diagnosis not present

## 2020-01-03 DIAGNOSIS — F0390 Unspecified dementia without behavioral disturbance: Secondary | ICD-10-CM | POA: Diagnosis not present

## 2020-01-11 DIAGNOSIS — M79641 Pain in right hand: Secondary | ICD-10-CM | POA: Diagnosis not present

## 2020-01-17 DIAGNOSIS — F330 Major depressive disorder, recurrent, mild: Secondary | ICD-10-CM | POA: Diagnosis not present

## 2020-01-17 DIAGNOSIS — R630 Anorexia: Secondary | ICD-10-CM | POA: Diagnosis not present

## 2020-01-17 DIAGNOSIS — E46 Unspecified protein-calorie malnutrition: Secondary | ICD-10-CM | POA: Diagnosis not present

## 2020-01-17 DIAGNOSIS — F0390 Unspecified dementia without behavioral disturbance: Secondary | ICD-10-CM | POA: Diagnosis not present

## 2020-01-17 DIAGNOSIS — F4320 Adjustment disorder, unspecified: Secondary | ICD-10-CM | POA: Diagnosis not present

## 2020-01-17 DIAGNOSIS — F329 Major depressive disorder, single episode, unspecified: Secondary | ICD-10-CM | POA: Diagnosis not present

## 2020-01-17 DIAGNOSIS — I959 Hypotension, unspecified: Secondary | ICD-10-CM | POA: Diagnosis not present

## 2020-01-30 DIAGNOSIS — L602 Onychogryphosis: Secondary | ICD-10-CM | POA: Diagnosis not present

## 2020-01-30 DIAGNOSIS — I739 Peripheral vascular disease, unspecified: Secondary | ICD-10-CM | POA: Diagnosis not present

## 2020-01-31 DIAGNOSIS — F4320 Adjustment disorder, unspecified: Secondary | ICD-10-CM | POA: Diagnosis not present

## 2020-01-31 DIAGNOSIS — R630 Anorexia: Secondary | ICD-10-CM | POA: Diagnosis not present

## 2020-01-31 DIAGNOSIS — F329 Major depressive disorder, single episode, unspecified: Secondary | ICD-10-CM | POA: Diagnosis not present

## 2020-01-31 DIAGNOSIS — F0390 Unspecified dementia without behavioral disturbance: Secondary | ICD-10-CM | POA: Diagnosis not present

## 2020-02-11 DIAGNOSIS — N390 Urinary tract infection, site not specified: Secondary | ICD-10-CM | POA: Diagnosis not present

## 2020-02-11 DIAGNOSIS — R319 Hematuria, unspecified: Secondary | ICD-10-CM | POA: Diagnosis not present

## 2020-02-11 DIAGNOSIS — R310 Gross hematuria: Secondary | ICD-10-CM | POA: Diagnosis not present

## 2020-02-11 DIAGNOSIS — R2243 Localized swelling, mass and lump, lower limb, bilateral: Secondary | ICD-10-CM | POA: Diagnosis not present

## 2020-02-12 DIAGNOSIS — I10 Essential (primary) hypertension: Secondary | ICD-10-CM | POA: Diagnosis not present

## 2020-02-12 DIAGNOSIS — D649 Anemia, unspecified: Secondary | ICD-10-CM | POA: Diagnosis not present

## 2020-02-16 DIAGNOSIS — R52 Pain, unspecified: Secondary | ICD-10-CM | POA: Diagnosis not present

## 2020-02-20 DIAGNOSIS — M545 Low back pain: Secondary | ICD-10-CM | POA: Diagnosis not present

## 2020-02-20 DIAGNOSIS — I10 Essential (primary) hypertension: Secondary | ICD-10-CM | POA: Diagnosis not present

## 2020-02-20 DIAGNOSIS — R296 Repeated falls: Secondary | ICD-10-CM | POA: Diagnosis not present

## 2020-02-20 DIAGNOSIS — G309 Alzheimer's disease, unspecified: Secondary | ICD-10-CM | POA: Diagnosis not present

## 2020-02-20 DIAGNOSIS — M542 Cervicalgia: Secondary | ICD-10-CM | POA: Diagnosis not present

## 2020-02-20 DIAGNOSIS — R293 Abnormal posture: Secondary | ICD-10-CM | POA: Diagnosis not present

## 2020-02-20 DIAGNOSIS — R278 Other lack of coordination: Secondary | ICD-10-CM | POA: Diagnosis not present

## 2020-02-20 DIAGNOSIS — R2681 Unsteadiness on feet: Secondary | ICD-10-CM | POA: Diagnosis not present

## 2020-02-21 DIAGNOSIS — R296 Repeated falls: Secondary | ICD-10-CM | POA: Diagnosis not present

## 2020-02-21 DIAGNOSIS — R293 Abnormal posture: Secondary | ICD-10-CM | POA: Diagnosis not present

## 2020-02-21 DIAGNOSIS — M545 Low back pain: Secondary | ICD-10-CM | POA: Diagnosis not present

## 2020-02-21 DIAGNOSIS — L89152 Pressure ulcer of sacral region, stage 2: Secondary | ICD-10-CM | POA: Diagnosis not present

## 2020-02-21 DIAGNOSIS — M542 Cervicalgia: Secondary | ICD-10-CM | POA: Diagnosis not present

## 2020-02-21 DIAGNOSIS — G309 Alzheimer's disease, unspecified: Secondary | ICD-10-CM | POA: Diagnosis not present

## 2020-02-21 DIAGNOSIS — R278 Other lack of coordination: Secondary | ICD-10-CM | POA: Diagnosis not present

## 2020-02-22 DIAGNOSIS — R278 Other lack of coordination: Secondary | ICD-10-CM | POA: Diagnosis not present

## 2020-02-22 DIAGNOSIS — G309 Alzheimer's disease, unspecified: Secondary | ICD-10-CM | POA: Diagnosis not present

## 2020-02-22 DIAGNOSIS — R293 Abnormal posture: Secondary | ICD-10-CM | POA: Diagnosis not present

## 2020-02-22 DIAGNOSIS — R296 Repeated falls: Secondary | ICD-10-CM | POA: Diagnosis not present

## 2020-02-22 DIAGNOSIS — M542 Cervicalgia: Secondary | ICD-10-CM | POA: Diagnosis not present

## 2020-02-22 DIAGNOSIS — M545 Low back pain: Secondary | ICD-10-CM | POA: Diagnosis not present

## 2020-02-23 DIAGNOSIS — M545 Low back pain: Secondary | ICD-10-CM | POA: Diagnosis not present

## 2020-02-23 DIAGNOSIS — R296 Repeated falls: Secondary | ICD-10-CM | POA: Diagnosis not present

## 2020-02-23 DIAGNOSIS — G309 Alzheimer's disease, unspecified: Secondary | ICD-10-CM | POA: Diagnosis not present

## 2020-02-23 DIAGNOSIS — R293 Abnormal posture: Secondary | ICD-10-CM | POA: Diagnosis not present

## 2020-02-23 DIAGNOSIS — M542 Cervicalgia: Secondary | ICD-10-CM | POA: Diagnosis not present

## 2020-02-23 DIAGNOSIS — R278 Other lack of coordination: Secondary | ICD-10-CM | POA: Diagnosis not present

## 2020-02-26 DIAGNOSIS — R278 Other lack of coordination: Secondary | ICD-10-CM | POA: Diagnosis not present

## 2020-02-26 DIAGNOSIS — M542 Cervicalgia: Secondary | ICD-10-CM | POA: Diagnosis not present

## 2020-02-26 DIAGNOSIS — G309 Alzheimer's disease, unspecified: Secondary | ICD-10-CM | POA: Diagnosis not present

## 2020-02-26 DIAGNOSIS — R296 Repeated falls: Secondary | ICD-10-CM | POA: Diagnosis not present

## 2020-02-26 DIAGNOSIS — R293 Abnormal posture: Secondary | ICD-10-CM | POA: Diagnosis not present

## 2020-02-26 DIAGNOSIS — M545 Low back pain: Secondary | ICD-10-CM | POA: Diagnosis not present

## 2020-02-28 DIAGNOSIS — G309 Alzheimer's disease, unspecified: Secondary | ICD-10-CM | POA: Diagnosis not present

## 2020-02-28 DIAGNOSIS — M545 Low back pain: Secondary | ICD-10-CM | POA: Diagnosis not present

## 2020-02-28 DIAGNOSIS — R278 Other lack of coordination: Secondary | ICD-10-CM | POA: Diagnosis not present

## 2020-02-28 DIAGNOSIS — L89152 Pressure ulcer of sacral region, stage 2: Secondary | ICD-10-CM | POA: Diagnosis not present

## 2020-02-28 DIAGNOSIS — M542 Cervicalgia: Secondary | ICD-10-CM | POA: Diagnosis not present

## 2020-02-28 DIAGNOSIS — R296 Repeated falls: Secondary | ICD-10-CM | POA: Diagnosis not present

## 2020-02-28 DIAGNOSIS — R293 Abnormal posture: Secondary | ICD-10-CM | POA: Diagnosis not present

## 2020-02-29 DIAGNOSIS — G309 Alzheimer's disease, unspecified: Secondary | ICD-10-CM | POA: Diagnosis not present

## 2020-02-29 DIAGNOSIS — M545 Low back pain: Secondary | ICD-10-CM | POA: Diagnosis not present

## 2020-02-29 DIAGNOSIS — R278 Other lack of coordination: Secondary | ICD-10-CM | POA: Diagnosis not present

## 2020-02-29 DIAGNOSIS — R293 Abnormal posture: Secondary | ICD-10-CM | POA: Diagnosis not present

## 2020-02-29 DIAGNOSIS — M542 Cervicalgia: Secondary | ICD-10-CM | POA: Diagnosis not present

## 2020-02-29 DIAGNOSIS — R296 Repeated falls: Secondary | ICD-10-CM | POA: Diagnosis not present

## 2020-03-01 DIAGNOSIS — F4320 Adjustment disorder, unspecified: Secondary | ICD-10-CM | POA: Diagnosis not present

## 2020-03-01 DIAGNOSIS — F0390 Unspecified dementia without behavioral disturbance: Secondary | ICD-10-CM | POA: Diagnosis not present

## 2020-03-01 DIAGNOSIS — F341 Dysthymic disorder: Secondary | ICD-10-CM | POA: Diagnosis not present

## 2020-03-01 DIAGNOSIS — G3184 Mild cognitive impairment, so stated: Secondary | ICD-10-CM | POA: Diagnosis not present

## 2020-03-01 DIAGNOSIS — R633 Feeding difficulties: Secondary | ICD-10-CM | POA: Diagnosis not present

## 2020-03-01 DIAGNOSIS — R630 Anorexia: Secondary | ICD-10-CM | POA: Diagnosis not present

## 2020-03-01 DIAGNOSIS — F329 Major depressive disorder, single episode, unspecified: Secondary | ICD-10-CM | POA: Diagnosis not present

## 2020-03-04 DIAGNOSIS — R293 Abnormal posture: Secondary | ICD-10-CM | POA: Diagnosis not present

## 2020-03-04 DIAGNOSIS — G309 Alzheimer's disease, unspecified: Secondary | ICD-10-CM | POA: Diagnosis not present

## 2020-03-04 DIAGNOSIS — R278 Other lack of coordination: Secondary | ICD-10-CM | POA: Diagnosis not present

## 2020-03-04 DIAGNOSIS — M542 Cervicalgia: Secondary | ICD-10-CM | POA: Diagnosis not present

## 2020-03-04 DIAGNOSIS — R296 Repeated falls: Secondary | ICD-10-CM | POA: Diagnosis not present

## 2020-03-04 DIAGNOSIS — M545 Low back pain: Secondary | ICD-10-CM | POA: Diagnosis not present

## 2020-03-05 DIAGNOSIS — M542 Cervicalgia: Secondary | ICD-10-CM | POA: Diagnosis not present

## 2020-03-05 DIAGNOSIS — G309 Alzheimer's disease, unspecified: Secondary | ICD-10-CM | POA: Diagnosis not present

## 2020-03-05 DIAGNOSIS — M545 Low back pain: Secondary | ICD-10-CM | POA: Diagnosis not present

## 2020-03-05 DIAGNOSIS — R278 Other lack of coordination: Secondary | ICD-10-CM | POA: Diagnosis not present

## 2020-03-05 DIAGNOSIS — R293 Abnormal posture: Secondary | ICD-10-CM | POA: Diagnosis not present

## 2020-03-05 DIAGNOSIS — R296 Repeated falls: Secondary | ICD-10-CM | POA: Diagnosis not present

## 2020-03-06 DIAGNOSIS — Z961 Presence of intraocular lens: Secondary | ICD-10-CM | POA: Diagnosis not present

## 2020-03-06 DIAGNOSIS — H01004 Unspecified blepharitis left upper eyelid: Secondary | ICD-10-CM | POA: Diagnosis not present

## 2020-03-06 DIAGNOSIS — L89152 Pressure ulcer of sacral region, stage 2: Secondary | ICD-10-CM | POA: Diagnosis not present

## 2020-03-06 DIAGNOSIS — H01001 Unspecified blepharitis right upper eyelid: Secondary | ICD-10-CM | POA: Diagnosis not present

## 2020-03-06 DIAGNOSIS — H04123 Dry eye syndrome of bilateral lacrimal glands: Secondary | ICD-10-CM | POA: Diagnosis not present

## 2020-03-08 DIAGNOSIS — R293 Abnormal posture: Secondary | ICD-10-CM | POA: Diagnosis not present

## 2020-03-08 DIAGNOSIS — R296 Repeated falls: Secondary | ICD-10-CM | POA: Diagnosis not present

## 2020-03-08 DIAGNOSIS — M545 Low back pain: Secondary | ICD-10-CM | POA: Diagnosis not present

## 2020-03-08 DIAGNOSIS — M542 Cervicalgia: Secondary | ICD-10-CM | POA: Diagnosis not present

## 2020-03-08 DIAGNOSIS — R278 Other lack of coordination: Secondary | ICD-10-CM | POA: Diagnosis not present

## 2020-03-08 DIAGNOSIS — G309 Alzheimer's disease, unspecified: Secondary | ICD-10-CM | POA: Diagnosis not present

## 2020-03-11 DIAGNOSIS — G309 Alzheimer's disease, unspecified: Secondary | ICD-10-CM | POA: Diagnosis not present

## 2020-03-11 DIAGNOSIS — M545 Low back pain: Secondary | ICD-10-CM | POA: Diagnosis not present

## 2020-03-11 DIAGNOSIS — R293 Abnormal posture: Secondary | ICD-10-CM | POA: Diagnosis not present

## 2020-03-11 DIAGNOSIS — R296 Repeated falls: Secondary | ICD-10-CM | POA: Diagnosis not present

## 2020-03-11 DIAGNOSIS — M542 Cervicalgia: Secondary | ICD-10-CM | POA: Diagnosis not present

## 2020-03-11 DIAGNOSIS — R278 Other lack of coordination: Secondary | ICD-10-CM | POA: Diagnosis not present

## 2020-05-05 DIAGNOSIS — E46 Unspecified protein-calorie malnutrition: Secondary | ICD-10-CM | POA: Diagnosis not present

## 2020-05-05 DIAGNOSIS — I82401 Acute embolism and thrombosis of unspecified deep veins of right lower extremity: Secondary | ICD-10-CM | POA: Diagnosis not present

## 2020-05-05 DIAGNOSIS — E039 Hypothyroidism, unspecified: Secondary | ICD-10-CM | POA: Diagnosis not present

## 2020-05-05 DIAGNOSIS — R2241 Localized swelling, mass and lump, right lower limb: Secondary | ICD-10-CM | POA: Diagnosis not present

## 2020-05-06 DIAGNOSIS — I10 Essential (primary) hypertension: Secondary | ICD-10-CM | POA: Diagnosis not present

## 2020-05-21 DIAGNOSIS — R05 Cough: Secondary | ICD-10-CM | POA: Diagnosis not present

## 2020-05-24 DIAGNOSIS — D649 Anemia, unspecified: Secondary | ICD-10-CM | POA: Diagnosis not present

## 2020-05-24 DIAGNOSIS — I10 Essential (primary) hypertension: Secondary | ICD-10-CM | POA: Diagnosis not present

## 2020-05-24 DIAGNOSIS — R0602 Shortness of breath: Secondary | ICD-10-CM | POA: Diagnosis not present

## 2020-06-18 DIAGNOSIS — F0390 Unspecified dementia without behavioral disturbance: Secondary | ICD-10-CM | POA: Diagnosis not present

## 2020-06-18 DIAGNOSIS — R293 Abnormal posture: Secondary | ICD-10-CM | POA: Diagnosis not present

## 2020-06-18 DIAGNOSIS — G629 Polyneuropathy, unspecified: Secondary | ICD-10-CM | POA: Diagnosis not present

## 2020-06-18 DIAGNOSIS — I10 Essential (primary) hypertension: Secondary | ICD-10-CM | POA: Diagnosis not present

## 2020-06-18 DIAGNOSIS — R278 Other lack of coordination: Secondary | ICD-10-CM | POA: Diagnosis not present

## 2020-06-18 DIAGNOSIS — M6281 Muscle weakness (generalized): Secondary | ICD-10-CM | POA: Diagnosis not present

## 2020-06-18 DIAGNOSIS — R2681 Unsteadiness on feet: Secondary | ICD-10-CM | POA: Diagnosis not present

## 2020-06-19 DIAGNOSIS — I83015 Varicose veins of right lower extremity with ulcer other part of foot: Secondary | ICD-10-CM | POA: Diagnosis not present

## 2020-06-19 DIAGNOSIS — F331 Major depressive disorder, recurrent, moderate: Secondary | ICD-10-CM | POA: Diagnosis not present

## 2020-06-19 DIAGNOSIS — F0390 Unspecified dementia without behavioral disturbance: Secondary | ICD-10-CM | POA: Diagnosis not present

## 2020-06-19 DIAGNOSIS — R630 Anorexia: Secondary | ICD-10-CM | POA: Diagnosis not present

## 2020-06-26 DIAGNOSIS — I83015 Varicose veins of right lower extremity with ulcer other part of foot: Secondary | ICD-10-CM | POA: Diagnosis not present

## 2020-07-03 DIAGNOSIS — L89102 Pressure ulcer of unspecified part of back, stage 2: Secondary | ICD-10-CM | POA: Diagnosis not present

## 2020-07-03 DIAGNOSIS — I83015 Varicose veins of right lower extremity with ulcer other part of foot: Secondary | ICD-10-CM | POA: Diagnosis not present

## 2020-07-03 DIAGNOSIS — L89610 Pressure ulcer of right heel, unstageable: Secondary | ICD-10-CM | POA: Diagnosis not present

## 2020-07-05 DIAGNOSIS — R69 Illness, unspecified: Secondary | ICD-10-CM | POA: Diagnosis not present

## 2020-07-05 DIAGNOSIS — I5032 Chronic diastolic (congestive) heart failure: Secondary | ICD-10-CM | POA: Diagnosis not present

## 2020-07-05 DIAGNOSIS — Z79899 Other long term (current) drug therapy: Secondary | ICD-10-CM | POA: Diagnosis not present

## 2020-07-05 DIAGNOSIS — E46 Unspecified protein-calorie malnutrition: Secondary | ICD-10-CM | POA: Diagnosis not present

## 2020-07-05 DIAGNOSIS — N492 Inflammatory disorders of scrotum: Secondary | ICD-10-CM | POA: Diagnosis not present

## 2020-07-05 DIAGNOSIS — R0989 Other specified symptoms and signs involving the circulatory and respiratory systems: Secondary | ICD-10-CM | POA: Diagnosis not present

## 2020-07-05 DIAGNOSIS — R7989 Other specified abnormal findings of blood chemistry: Secondary | ICD-10-CM | POA: Diagnosis not present

## 2020-07-05 DIAGNOSIS — D649 Anemia, unspecified: Secondary | ICD-10-CM | POA: Diagnosis not present

## 2020-07-08 ENCOUNTER — Emergency Department (HOSPITAL_COMMUNITY): Payer: Medicare Other

## 2020-07-08 ENCOUNTER — Other Ambulatory Visit: Payer: Self-pay

## 2020-07-08 ENCOUNTER — Encounter (HOSPITAL_COMMUNITY): Payer: Self-pay | Admitting: Emergency Medicine

## 2020-07-08 ENCOUNTER — Inpatient Hospital Stay (HOSPITAL_COMMUNITY)
Admission: EM | Admit: 2020-07-08 | Discharge: 2020-07-19 | DRG: 853 | Disposition: A | Discharge status: Skilled Nursing Facility | Payer: Medicare Other | Attending: Internal Medicine | Admitting: Internal Medicine

## 2020-07-08 DIAGNOSIS — Z515 Encounter for palliative care: Secondary | ICD-10-CM | POA: Diagnosis not present

## 2020-07-08 DIAGNOSIS — Z8249 Family history of ischemic heart disease and other diseases of the circulatory system: Secondary | ICD-10-CM

## 2020-07-08 DIAGNOSIS — A419 Sepsis, unspecified organism: Secondary | ICD-10-CM | POA: Diagnosis not present

## 2020-07-08 DIAGNOSIS — B964 Proteus (mirabilis) (morganii) as the cause of diseases classified elsewhere: Secondary | ICD-10-CM | POA: Diagnosis present

## 2020-07-08 DIAGNOSIS — E785 Hyperlipidemia, unspecified: Secondary | ICD-10-CM | POA: Diagnosis present

## 2020-07-08 DIAGNOSIS — N35919 Unspecified urethral stricture, male, unspecified site: Secondary | ICD-10-CM | POA: Diagnosis present

## 2020-07-08 DIAGNOSIS — N390 Urinary tract infection, site not specified: Secondary | ICD-10-CM | POA: Diagnosis not present

## 2020-07-08 DIAGNOSIS — N139 Obstructive and reflux uropathy, unspecified: Secondary | ICD-10-CM | POA: Diagnosis present

## 2020-07-08 DIAGNOSIS — M5459 Other low back pain: Secondary | ICD-10-CM | POA: Diagnosis not present

## 2020-07-08 DIAGNOSIS — R627 Adult failure to thrive: Secondary | ICD-10-CM | POA: Diagnosis present

## 2020-07-08 DIAGNOSIS — E871 Hypo-osmolality and hyponatremia: Secondary | ICD-10-CM | POA: Diagnosis not present

## 2020-07-08 DIAGNOSIS — N179 Acute kidney failure, unspecified: Secondary | ICD-10-CM | POA: Diagnosis present

## 2020-07-08 DIAGNOSIS — G4700 Insomnia, unspecified: Secondary | ICD-10-CM | POA: Diagnosis present

## 2020-07-08 DIAGNOSIS — Z85828 Personal history of other malignant neoplasm of skin: Secondary | ICD-10-CM | POA: Diagnosis not present

## 2020-07-08 DIAGNOSIS — L8990 Pressure ulcer of unspecified site, unspecified stage: Secondary | ICD-10-CM | POA: Insufficient documentation

## 2020-07-08 DIAGNOSIS — K8020 Calculus of gallbladder without cholecystitis without obstruction: Secondary | ICD-10-CM | POA: Diagnosis not present

## 2020-07-08 DIAGNOSIS — N5089 Other specified disorders of the male genital organs: Secondary | ICD-10-CM | POA: Diagnosis not present

## 2020-07-08 DIAGNOSIS — L89610 Pressure ulcer of right heel, unstageable: Secondary | ICD-10-CM | POA: Diagnosis not present

## 2020-07-08 DIAGNOSIS — N189 Chronic kidney disease, unspecified: Secondary | ICD-10-CM | POA: Diagnosis not present

## 2020-07-08 DIAGNOSIS — Z6821 Body mass index (BMI) 21.0-21.9, adult: Secondary | ICD-10-CM

## 2020-07-08 DIAGNOSIS — L89102 Pressure ulcer of unspecified part of back, stage 2: Secondary | ICD-10-CM | POA: Diagnosis not present

## 2020-07-08 DIAGNOSIS — Z20822 Contact with and (suspected) exposure to covid-19: Secondary | ICD-10-CM | POA: Diagnosis not present

## 2020-07-08 DIAGNOSIS — R1310 Dysphagia, unspecified: Secondary | ICD-10-CM | POA: Diagnosis present

## 2020-07-08 DIAGNOSIS — N492 Inflammatory disorders of scrotum: Secondary | ICD-10-CM | POA: Diagnosis not present

## 2020-07-08 DIAGNOSIS — N1330 Unspecified hydronephrosis: Secondary | ICD-10-CM | POA: Diagnosis present

## 2020-07-08 DIAGNOSIS — Z7901 Long term (current) use of anticoagulants: Secondary | ICD-10-CM

## 2020-07-08 DIAGNOSIS — I110 Hypertensive heart disease with heart failure: Secondary | ICD-10-CM | POA: Diagnosis present

## 2020-07-08 DIAGNOSIS — B951 Streptococcus, group B, as the cause of diseases classified elsewhere: Secondary | ICD-10-CM | POA: Diagnosis present

## 2020-07-08 DIAGNOSIS — L02215 Cutaneous abscess of perineum: Secondary | ICD-10-CM | POA: Diagnosis present

## 2020-07-08 DIAGNOSIS — E875 Hyperkalemia: Secondary | ICD-10-CM | POA: Diagnosis present

## 2020-07-08 DIAGNOSIS — I5032 Chronic diastolic (congestive) heart failure: Secondary | ICD-10-CM | POA: Diagnosis not present

## 2020-07-08 DIAGNOSIS — Z833 Family history of diabetes mellitus: Secondary | ICD-10-CM

## 2020-07-08 DIAGNOSIS — F0390 Unspecified dementia without behavioral disturbance: Secondary | ICD-10-CM | POA: Diagnosis not present

## 2020-07-08 DIAGNOSIS — N433 Hydrocele, unspecified: Secondary | ICD-10-CM | POA: Diagnosis not present

## 2020-07-08 DIAGNOSIS — I10 Essential (primary) hypertension: Secondary | ICD-10-CM | POA: Diagnosis not present

## 2020-07-08 DIAGNOSIS — Z79899 Other long term (current) drug therapy: Secondary | ICD-10-CM

## 2020-07-08 DIAGNOSIS — Z7189 Other specified counseling: Secondary | ICD-10-CM | POA: Diagnosis not present

## 2020-07-08 DIAGNOSIS — M542 Cervicalgia: Secondary | ICD-10-CM | POA: Diagnosis not present

## 2020-07-08 DIAGNOSIS — M2550 Pain in unspecified joint: Secondary | ICD-10-CM | POA: Diagnosis not present

## 2020-07-08 DIAGNOSIS — N401 Enlarged prostate with lower urinary tract symptoms: Secondary | ICD-10-CM | POA: Diagnosis present

## 2020-07-08 DIAGNOSIS — F419 Anxiety disorder, unspecified: Secondary | ICD-10-CM | POA: Diagnosis present

## 2020-07-08 DIAGNOSIS — K5900 Constipation, unspecified: Secondary | ICD-10-CM | POA: Diagnosis not present

## 2020-07-08 DIAGNOSIS — N136 Pyonephrosis: Secondary | ICD-10-CM | POA: Diagnosis present

## 2020-07-08 DIAGNOSIS — R64 Cachexia: Secondary | ICD-10-CM | POA: Diagnosis present

## 2020-07-08 DIAGNOSIS — Z79890 Hormone replacement therapy (postmenopausal): Secondary | ICD-10-CM

## 2020-07-08 DIAGNOSIS — R5381 Other malaise: Secondary | ICD-10-CM | POA: Diagnosis not present

## 2020-07-08 DIAGNOSIS — Z66 Do not resuscitate: Secondary | ICD-10-CM

## 2020-07-08 DIAGNOSIS — M726 Necrotizing fasciitis: Secondary | ICD-10-CM | POA: Diagnosis not present

## 2020-07-08 DIAGNOSIS — E039 Hypothyroidism, unspecified: Secondary | ICD-10-CM | POA: Diagnosis present

## 2020-07-08 DIAGNOSIS — L0231 Cutaneous abscess of buttock: Secondary | ICD-10-CM

## 2020-07-08 DIAGNOSIS — N400 Enlarged prostate without lower urinary tract symptoms: Secondary | ICD-10-CM | POA: Diagnosis not present

## 2020-07-08 DIAGNOSIS — M419 Scoliosis, unspecified: Secondary | ICD-10-CM | POA: Diagnosis present

## 2020-07-08 DIAGNOSIS — R6521 Severe sepsis with septic shock: Secondary | ICD-10-CM | POA: Diagnosis not present

## 2020-07-08 DIAGNOSIS — K529 Noninfective gastroenteritis and colitis, unspecified: Secondary | ICD-10-CM | POA: Diagnosis present

## 2020-07-08 DIAGNOSIS — D649 Anemia, unspecified: Secondary | ICD-10-CM | POA: Diagnosis present

## 2020-07-08 DIAGNOSIS — J302 Other seasonal allergic rhinitis: Secondary | ICD-10-CM | POA: Diagnosis present

## 2020-07-08 DIAGNOSIS — R338 Other retention of urine: Secondary | ICD-10-CM | POA: Diagnosis not present

## 2020-07-08 DIAGNOSIS — Z880 Allergy status to penicillin: Secondary | ICD-10-CM

## 2020-07-08 DIAGNOSIS — F329 Major depressive disorder, single episode, unspecified: Secondary | ICD-10-CM | POA: Diagnosis not present

## 2020-07-08 DIAGNOSIS — I509 Heart failure, unspecified: Secondary | ICD-10-CM | POA: Diagnosis not present

## 2020-07-08 DIAGNOSIS — R1312 Dysphagia, oropharyngeal phase: Secondary | ICD-10-CM | POA: Diagnosis not present

## 2020-07-08 DIAGNOSIS — E861 Hypovolemia: Secondary | ICD-10-CM | POA: Diagnosis not present

## 2020-07-08 DIAGNOSIS — Z789 Other specified health status: Secondary | ICD-10-CM

## 2020-07-08 DIAGNOSIS — R823 Hemoglobinuria: Secondary | ICD-10-CM | POA: Diagnosis present

## 2020-07-08 DIAGNOSIS — I130 Hypertensive heart and chronic kidney disease with heart failure and stage 1 through stage 4 chronic kidney disease, or unspecified chronic kidney disease: Secondary | ICD-10-CM | POA: Diagnosis not present

## 2020-07-08 DIAGNOSIS — N320 Bladder-neck obstruction: Secondary | ICD-10-CM | POA: Diagnosis present

## 2020-07-08 DIAGNOSIS — K219 Gastro-esophageal reflux disease without esophagitis: Secondary | ICD-10-CM | POA: Diagnosis present

## 2020-07-08 DIAGNOSIS — E46 Unspecified protein-calorie malnutrition: Secondary | ICD-10-CM | POA: Diagnosis not present

## 2020-07-08 DIAGNOSIS — R293 Abnormal posture: Secondary | ICD-10-CM | POA: Diagnosis not present

## 2020-07-08 DIAGNOSIS — R410 Disorientation, unspecified: Secondary | ICD-10-CM | POA: Diagnosis not present

## 2020-07-08 DIAGNOSIS — Z888 Allergy status to other drugs, medicaments and biological substances status: Secondary | ICD-10-CM

## 2020-07-08 DIAGNOSIS — R4702 Dysphasia: Secondary | ICD-10-CM | POA: Diagnosis present

## 2020-07-08 DIAGNOSIS — N35112 Postinfective bulbous urethral stricture, not elsewhere classified: Secondary | ICD-10-CM | POA: Diagnosis not present

## 2020-07-08 DIAGNOSIS — E43 Unspecified severe protein-calorie malnutrition: Secondary | ICD-10-CM | POA: Diagnosis present

## 2020-07-08 DIAGNOSIS — E870 Hyperosmolality and hypernatremia: Secondary | ICD-10-CM | POA: Diagnosis not present

## 2020-07-08 DIAGNOSIS — R2681 Unsteadiness on feet: Secondary | ICD-10-CM | POA: Diagnosis not present

## 2020-07-08 DIAGNOSIS — G8929 Other chronic pain: Secondary | ICD-10-CM | POA: Diagnosis not present

## 2020-07-08 DIAGNOSIS — G309 Alzheimer's disease, unspecified: Secondary | ICD-10-CM | POA: Diagnosis not present

## 2020-07-08 DIAGNOSIS — R52 Pain, unspecified: Secondary | ICD-10-CM | POA: Diagnosis not present

## 2020-07-08 DIAGNOSIS — R05 Cough: Secondary | ICD-10-CM | POA: Diagnosis not present

## 2020-07-08 DIAGNOSIS — R609 Edema, unspecified: Secondary | ICD-10-CM | POA: Diagnosis not present

## 2020-07-08 DIAGNOSIS — Z823 Family history of stroke: Secondary | ICD-10-CM

## 2020-07-08 DIAGNOSIS — K5909 Other constipation: Secondary | ICD-10-CM | POA: Diagnosis present

## 2020-07-08 DIAGNOSIS — Z7401 Bed confinement status: Secondary | ICD-10-CM | POA: Diagnosis not present

## 2020-07-08 DIAGNOSIS — Z8781 Personal history of (healed) traumatic fracture: Secondary | ICD-10-CM | POA: Diagnosis not present

## 2020-07-08 DIAGNOSIS — G609 Hereditary and idiopathic neuropathy, unspecified: Secondary | ICD-10-CM | POA: Diagnosis not present

## 2020-07-08 LAB — CBC WITH DIFFERENTIAL/PLATELET
Abs Immature Granulocytes: 0 10*3/uL (ref 0.00–0.07)
Basophils Absolute: 0 10*3/uL (ref 0.0–0.1)
Basophils Relative: 0 %
Eosinophils Absolute: 0 10*3/uL (ref 0.0–0.5)
Eosinophils Relative: 0 %
HCT: 33.3 % — ABNORMAL LOW (ref 39.0–52.0)
Hemoglobin: 10.6 g/dL — ABNORMAL LOW (ref 13.0–17.0)
Lymphocytes Relative: 7 %
Lymphs Abs: 1 10*3/uL (ref 0.7–4.0)
MCH: 28.6 pg (ref 26.0–34.0)
MCHC: 31.8 g/dL (ref 30.0–36.0)
MCV: 89.8 fL (ref 80.0–100.0)
Monocytes Absolute: 0.3 10*3/uL (ref 0.1–1.0)
Monocytes Relative: 2 %
Neutro Abs: 12.6 10*3/uL — ABNORMAL HIGH (ref 1.7–7.7)
Neutrophils Relative %: 91 %
Platelets: 380 10*3/uL (ref 150–400)
RBC: 3.71 MIL/uL — ABNORMAL LOW (ref 4.22–5.81)
RDW: 14.5 % (ref 11.5–15.5)
WBC: 13.9 10*3/uL — ABNORMAL HIGH (ref 4.0–10.5)
nRBC: 0 % (ref 0.0–0.2)
nRBC: 0 /100{WBCs}

## 2020-07-08 LAB — COMPREHENSIVE METABOLIC PANEL
ALT: 45 U/L — ABNORMAL HIGH (ref 0–44)
AST: 44 U/L — ABNORMAL HIGH (ref 15–41)
Albumin: 2 g/dL — ABNORMAL LOW (ref 3.5–5.0)
Alkaline Phosphatase: 213 U/L — ABNORMAL HIGH (ref 38–126)
Anion gap: 12 (ref 5–15)
BUN: 126 mg/dL — ABNORMAL HIGH (ref 8–23)
CO2: 16 mmol/L — ABNORMAL LOW (ref 22–32)
Calcium: 8.9 mg/dL (ref 8.9–10.3)
Chloride: 106 mmol/L (ref 98–111)
Creatinine, Ser: 3.23 mg/dL — ABNORMAL HIGH (ref 0.61–1.24)
GFR calc Af Amer: 18 mL/min — ABNORMAL LOW (ref >60)
GFR calc non Af Amer: 16 mL/min — ABNORMAL LOW (ref >60)
Glucose, Bld: 114 mg/dL — ABNORMAL HIGH (ref 70–99)
Potassium: 5.2 mmol/L — ABNORMAL HIGH (ref 3.5–5.1)
Sodium: 134 mmol/L — ABNORMAL LOW (ref 135–145)
Total Bilirubin: 0.6 mg/dL (ref 0.3–1.2)
Total Protein: 6.6 g/dL (ref 6.5–8.1)

## 2020-07-08 LAB — URINALYSIS, ROUTINE W REFLEX MICROSCOPIC
Bilirubin Urine: NEGATIVE
Glucose, UA: NEGATIVE mg/dL
Ketones, ur: NEGATIVE mg/dL
Nitrite: NEGATIVE
Protein, ur: 100 mg/dL — AB
RBC / HPF: >50 RBC/hpf — ABNORMAL HIGH (ref 0–5)
Specific Gravity, Urine: 1.011 (ref 1.005–1.030)
WBC, UA: >50 WBC/hpf — ABNORMAL HIGH (ref 0–5)
pH: 6 (ref 5.0–8.0)

## 2020-07-08 LAB — PROTIME-INR
INR: 1.9 — ABNORMAL HIGH (ref 0.8–1.2)
Prothrombin Time: 21 seconds — ABNORMAL HIGH (ref 11.4–15.2)

## 2020-07-08 LAB — LACTIC ACID, PLASMA
Lactic Acid, Venous: 2 mmol/L (ref 0.5–1.9)
Lactic Acid, Venous: 1.6 mmol/L (ref 0.5–1.9)

## 2020-07-08 MED ORDER — VANCOMYCIN VARIABLE DOSE PER UNSTABLE RENAL FUNCTION (PHARMACIST DOSING): Status: DC

## 2020-07-08 MED ORDER — SODIUM CHLORIDE 0.9 % IV SOLN: INTRAVENOUS | Status: DC

## 2020-07-08 MED ORDER — LACTATED RINGERS IV BOLUS (SEPSIS): 250.0000 mL | Freq: Once | INTRAVENOUS | Status: DC

## 2020-07-08 MED ORDER — ACETAMINOPHEN 650 MG RE SUPP: 650.0000 mg | Freq: Four times a day (QID) | RECTAL | Status: DC | PRN

## 2020-07-08 MED ORDER — METRONIDAZOLE IN NACL 5-0.79 MG/ML-% IV SOLN
500.0000 mg | Freq: Once | INTRAVENOUS | Status: AC
  Administered 2020-07-08: 500 mg via INTRAVENOUS
  Filled 2020-07-08: qty 100

## 2020-07-08 MED ORDER — MORPHINE SULFATE (PF) 4 MG/ML IV SOLN
4.0000 mg | Freq: Once | INTRAVENOUS | Status: AC
  Administered 2020-07-08: 4 mg via INTRAVENOUS
  Filled 2020-07-08: qty 1

## 2020-07-08 MED ORDER — SODIUM CHLORIDE 0.9 % IV BOLUS
1000.0000 mL | Freq: Once | INTRAVENOUS | Status: AC
  Administered 2020-07-08: 1000 mL via INTRAVENOUS

## 2020-07-08 MED ORDER — SODIUM CHLORIDE 0.9 % IV SOLN
2.0000 g | Freq: Once | INTRAVENOUS | Status: AC
  Administered 2020-07-08: 2 g via INTRAVENOUS
  Filled 2020-07-08: qty 2

## 2020-07-08 MED ORDER — ONDANSETRON HCL 4 MG PO TABS: 4.0000 mg | ORAL_TABLET | Freq: Four times a day (QID) | ORAL | Status: DC | PRN

## 2020-07-08 MED ORDER — ACETAMINOPHEN 325 MG PO TABS
650.0000 mg | ORAL_TABLET | Freq: Four times a day (QID) | ORAL | Status: DC | PRN
  Administered 2020-07-09 – 2020-07-12 (×3): 650 mg via ORAL
  Filled 2020-07-08 (×3): qty 2

## 2020-07-08 MED ORDER — SODIUM CHLORIDE 0.9 % IV SOLN
2.0000 g | INTRAVENOUS | Status: DC
  Administered 2020-07-09 – 2020-07-14 (×7): 2 g via INTRAVENOUS
  Filled 2020-07-08 (×7): qty 2

## 2020-07-08 MED ORDER — ONDANSETRON HCL 4 MG/2ML IJ SOLN: 4.0000 mg | Freq: Four times a day (QID) | INTRAMUSCULAR | Status: DC | PRN

## 2020-07-08 MED ORDER — VANCOMYCIN HCL IN DEXTROSE 1-5 GM/200ML-% IV SOLN
1000.0000 mg | Freq: Once | INTRAVENOUS | Status: AC
  Administered 2020-07-08: 1000 mg via INTRAVENOUS
  Filled 2020-07-08: qty 200

## 2020-07-08 NOTE — H&P (Signed)
History and Physical    Wayne [name] OVF:[id number] DOB: 1926-07-19 DOA: 07/08/2020  PCP: Isaac Bliss, Rayford Halsted, MD   Patient coming from: Home.   I have personally briefly reviewed patient's old medical records in [place]  Chief Complaint: Sent by NH due to scrotum swelling and redness.  HPI: Wayne [name] is a 86 y.o. male with medical history significant of anxiety, dementia, chronic back and neck pain, unspecified skin cancer, chronic diastolic CHF, constipation, BPH with history of self cathing in the past, GERD, seasonal allergies, hyperlipidemia, hypertension, hypothyroidism, insomnia, history of MRSA, osteoporosis, history of peripheral edema, scoliosis, who is coming to the emergency department via EMS after being sent by his nursing facility secondary to scrotal edema and erythema.  History is limited by the patient's dementia and clinical status.  ED Course: Initial vital signs were temperature 98.1 F, pulse 95, respirations 16, blood pressure 103/77 mmHg and O2 sat 99% on room air.  The patient recently 2 L of NS bolus, morphine 4 mg IVP, cefepime, metronidazole and vancomycin.  Urinalysis showed brown urine with turbid appearance with large hemoglobinuria, moderate leukocyte esterase, more than 50 RBC more than 50 WBC per hpf with many bacteria.  CBC had a white count of 13.9, hemoglobin 10.6 g/dL and platelets 380.  PT 21.0 and INR 1.9.  His lactic was acid 2.0 and then 1.6 mmol/L.  CMP shows sodium 134, potassium 5.2, chloride 106 and CO2 16 mmol/L.  Anion gap was 12.  Glucose 114, BUN 126 and creatinine 3.23 mg/dL (baseline creatinine around 1.7 mg/dL).  Total protein 6.6, albumin 2.0 g/dL.  AST ALT are mildly elevated in the 40s.  Alkaline phosphatase 213 and total bilirubin was normal.  Imaging: A 2 view chest radiograph did not show any acute cardiopulmonary disease.  CT abdomen/pelvis with extensive phlegmon from the left gluteal cleft all the way to the base of  the penile corpora.  Moderate bilateral hydro-retronephrosis without obstructing calculus.  There is possible superimposed cystitis.  Heterogeneous liver mass, no other findings.  A scrotal ultrasound shows extensive soft tissue edema.  Please see images and full radiology report for further detail.  Review of Systems: As per HPI otherwise all other systems reviewed and are negative.  Past Medical History:  Diagnosis Date  . Anxiety    takes Atrivan daily  . Back pain    chronic with neck pain  . Cancer ([place])    skin  . CHF (congestive heart failure) ([place])   . Constipation    related to medication  . Dementia ([place])    takes Aricept nightly  . Enlarged prostate    self caths once every 1-2wks  . GERD (gastroesophageal reflux disease)    doesn't require medication  . Hx of seasonal allergies   . Hyperlipidemia    takes Simvastatin daily  . Hypertension    takes atenolol daily  . Hypothyroidism    takes Synthroid daily  . Insomnia    related to pain  . MRSA (methicillin resistant staph aureus) culture positive    Per patient tested on 10/07/11.  . Osteoporosis   . Peripheral edema    takes Torsemide daily  . Scoliosis    Past Surgical History:  Procedure Laterality Date  . abdominal cyst     removed-2012;MRSA done by Dr.Gross  . CATARACT EXTRACTION    . cataracts     bilateral  . CERVICAL DISC SURGERY     x1  . COLONOSCOPY    .  ESOPHAGOGASTRODUODENOSCOPY    . HERNIA REPAIR     Right inguinal  . INTRAMEDULLARY (IM) NAIL INTERTROCHANTERIC Left 10/12/2018  . INTRAMEDULLARY (IM) NAIL INTERTROCHANTERIC Left 10/12/2018   Procedure: INTRAMEDULLARY (IM) NAIL INTERTROCHANTRIC;  Surgeon: Renette Butters, MD;  Location: [facility];  Service: Orthopedics;  Laterality: Left;  . LUMBAR DISC SURGERY     x2  . TOE SURGERY     right great toe  . TRANSURETHRAL RESECTION OF PROSTATE     15+yrs ago   Social History  reports that he has never smoked. He has never used smokeless  tobacco. He reports that he does not drink alcohol and does not use drugs.  Allergies  Allergen Reactions  . Doxazosin Mesylate Other (See Comments)    Makes blood pressure  . Propoxyphene Hcl Other (See Comments)    REACTION: upset stomach  . Penicillins Rash    DID THE REACTION INVOLVE: Swelling of the face/tongue/throat, SOB, or low BP? No Sudden or severe rash/hives, skin peeling, or the inside of the mouth or nose? Yes Did it require medical treatment? Yes When did it last happen?50 yrs ago If all above answers are "NO", may proceed with cephalosporin use.    Family History  Problem Relation Age of Onset  . Heart disease Mother 55       Vague history  . Stroke Father   . Hypertension Other        Multiple family members both sides  . Diabetes Other   . Anesthesia problems Neg Hx   . Hypotension Neg Hx   . Malignant hyperthermia Neg Hx   . Pseudochol deficiency Neg Hx    Prior to Admission medications   Medication Sig Start Date End Date Taking? Authorizing Provider  acetaminophen (TYLENOL) 325 MG tablet Take 650 mg by mouth in the morning, at noon, and at bedtime.   Yes [provider]  ELIQUIS 2.5 MG TABS tablet Take 2.5 mg by mouth 2 (two) times daily. 06/25/20  Yes [provider]  Infant Care Products (DERMACLOUD) CREA Apply 1 application topically in the morning, at noon, and at bedtime.   Yes [provider]  levofloxacin (LEVAQUIN) 500 MG tablet Take 500 mg by mouth daily. 07/06/20  Yes [provider]  levothyroxine (SYNTHROID) 25 MCG tablet Take 50 mcg by mouth daily before breakfast.  06/29/20  Yes [provider]  magic mouthwash SOLN Take 5 mLs by mouth in the morning and at bedtime.   Yes [provider]  mirtazapine (REMERON) 15 MG tablet Take 15 mg by mouth at bedtime. 06/11/20  Yes [provider]  Pollen Extracts (PROSTAT PO) Take 30 mLs by mouth in the morning and at bedtime.   Yes [provider]  polyethylene glycol (MIRALAX / GLYCOLAX) packet Take 17 g by mouth daily as needed for mild constipation. 10/15/18  Yes Shelly Coss, MD  senna-docusate (SENOKOT-S) 8.6-50 MG tablet Take 1 tablet by mouth at bedtime as needed for mild constipation. 10/15/18  Yes Shelly Coss, MD  sertraline (ZOLOFT) 25 MG tablet Take 75 mg by mouth daily.  06/25/20  Yes [provider]  simvastatin (ZOCOR) 40 MG tablet TAKE 1 TABLET BY MOUTH EVERY DAY AT NIGHT 06/08/19  Yes Isaac Bliss, Rayford Halsted, MD  torsemide (DEMADEX) 20 MG tablet TAKE 1 TABLET BY MOUTH EVERY DAY 07/25/19  Yes Isaac Bliss, Rayford Halsted, MD  traMADol (ULTRAM) 50 MG tablet Take 50 mg by mouth 3 (three) times daily as  needed for moderate pain or severe pain.  07/08/20  Yes [provider]  atenolol (TENORMIN) 50 MG tablet TAKE 1 TABLET BY MOUTH EVERY DAY Patient not taking: Reported on 07/08/2020 07/11/19   Isaac Bliss, Rayford Halsted, MD  donepezil (ARICEPT) 5 MG tablet TAKE 1 TABLET BY MOUTH EVERYDAY AT BEDTIME Patient not taking: Reported on 07/08/2020 07/11/19   Isaac Bliss, Rayford Halsted, MD  levothyroxine (SYNTHROID) 50 MCG tablet TAKE 1 TABLET BY MOUTH DAILY BEFORE BREAKFAST Patient not taking: Reported on 07/08/2020 09/27/19   Isaac Bliss, Rayford Halsted, MD  LORazepam (ATIVAN) 0.5 MG tablet Take 1 tablet (0.5 mg total) by mouth at bedtime as needed for sleep. Patient not taking: Reported on 07/08/2020 02/09/19   Isaac Bliss, Rayford Halsted, MD   Physical Exam: Vitals:   07/08/20 1900 07/08/20 1915 07/08/20 1930 07/08/20 1945  BP:  106/69 104/65 (!) 95/56  Pulse:  92 91 91  Resp:      Temp:      TempSrc:      SpO2:  99% 98% 98%  Weight: 51.7 kg     Height: 5\' 7"  (1.702 m)      Constitutional: Malnourished. Eyes: PERRL, lids and conjunctivae normal ENMT: Mucous membranes are mildly dry. Posterior pharynx clear of any exudate or lesions. Neck: normal, supple, no masses, no thyromegaly Respiratory:  Decreased breath sounds in bases, otherwise clear to auscultation bilaterally, no wheezing, no crackles. Normal respiratory effort. No accessory muscle use.  Cardiovascular: Regular rate and rhythm with occasional extrasystole, no murmurs / rubs / gallops. No extremity edema. 2+ pedal pulses. No carotid bruits.  Abdomen: Nondistended.  Bowel sounds positive.  Soft, no tenderness, no masses palpated. No hepatosplenomegaly.  Musculoskeletal: Generalized weakness.  No clubbing / cyanosis. Good ROM, no contractures. Normal muscle tone.  Skin: Extensive scrotal edema with erythema extending into the perineum and phlegmon.  Please see pictures below.  Please see exam performed by urology and urology for further description. Neurologic: CN 2-12 grossly intact.  Generalized weakness. Psychiatric: Oriented to name only.    Labs on Admission: I have personally reviewed following labs and imaging studies  CBC: Recent Labs  Lab 07/08/20 1528  WBC 13.9*  NEUTROABS 12.6*  HGB 10.6*  HCT 33.3*  MCV 89.8  PLT 073    Basic Metabolic Panel: Recent Labs  Lab 07/08/20 1528  NA 134*  K 5.2*  CL 106  CO2 16*  GLUCOSE 114*  BUN 126*  CREATININE 3.23*  CALCIUM 8.9    GFR: Estimated Creatinine Clearance: 10.9 mL/min (A) (by C-G formula based on SCr of 3.23 mg/dL (H)).  Liver Function Tests: Recent Labs  Lab 07/08/20 1528  AST 44*  ALT 45*  ALKPHOS 213*  BILITOT 0.6  PROT 6.6  ALBUMIN 2.0*    Urine analysis:    Component Value Date/Time   COLORURINE BROWN (A) 07/08/2020 2232   APPEARANCEUR TURBID (A) 07/08/2020 2232   LABSPEC 1.011 07/08/2020 2232   PHURINE 6.0 07/08/2020 2232   GLUCOSEU NEGATIVE 07/08/2020 2232   HGBUR LARGE (A) 07/08/2020 2232   BILIRUBINUR NEGATIVE 07/08/2020 2232   BILIRUBINUR n 11/11/2015 1716   [place] 07/08/2020 2232   PROTEINUR 100 (A) 07/08/2020 2232   UROBILINOGEN 0.2 11/11/2015 1716   UROBILINOGEN 0.2 02/15/2012 1335   NITRITE NEGATIVE  07/08/2020 2232   LEUKOCYTESUR MODERATE (A) 07/08/2020 2232    Radiological Exams on Admission: CT ABDOMEN PELVIS WO CONTRAST  Result Date: 07/08/2020 CLINICAL DATA:  Concern for scrotal  abscess/Fournier's gangrene EXAM: CT ABDOMEN AND PELVIS WITHOUT CONTRAST TECHNIQUE: Multidetector CT imaging of the abdomen and pelvis was performed following the standard protocol without IV contrast. COMPARISON:  CT L-spine 07/20/2011, chest radiograph 07/08/2020 FINDINGS: Lower chest: Chronic appearing chest wall deformity is noted likely related to severe scoliotic curvature. There are bandlike and consolidative opacities in the bilateral lung bases centered upon thickened lower lobe airways bilaterally which could reflect some atelectatic change and/or scarring though sequela of aspiration or infection could have a similar appearance. Normal heart size. No pericardial effusion. Three-vessel coronary artery atherosclerosis is noted. Hypoattenuation of the cardiac blood pool rib likely reflecting some mild anemia. Atherosclerotic calcification in the visible portions of the thoracic aorta. Hepatobiliary: Incompletely characterized peripherally hypoattenuating, centrally isoattenuating lesion in the inferior right lobe liver measuring approximately 3.8 x 4.8 x 5.2 cm (6/54, 3/24). No other focal concerning liver lesions. Gallbladder distention is top-normal. Partially calcified gallstone at the gallbladder neck. No pericholecystic fluid or inflammation. No biliary ductal dilatation or intraductal gallstones. Pancreas: Partial fatty replacement of the pancreas. No pancreatic ductal dilatation or surrounding inflammatory changes. Spleen: Normal in size. No concerning splenic lesions. Adrenals/Urinary Tract: Normal adrenal glands. Kidneys are symmetric in size and normally located. There are scattered subcentimeter hypoattenuating foci in both kidneys too small to fully characterize on CT imaging but statistically likely  benign. Hyperdense renal pyramids left, a nonspecific finding but can be seen urinary obstruction, metabolic derangement or papillary necrosis among other etiologies. There is moderate bilateral hydroureteronephrosis without obstructing calculus. Urinary bladder is circumferentially thickened with numerous bladder diverticula. Stomach/Bowel: Distal esophagus, stomach and duodenal sweep are unremarkable. No small bowel wall thickening or dilatation. No evidence of obstruction. A normal appendix is visualized. Interposition of the hepatic flexure anterior to the liver. There is a large inspissated rectal stool ball with circumferential rectal wall thickening and inflammation as well as stranding in the presacral space. Marked thickening is also seen along the levator plate. Feculent material noted within the gluteal cleft. Vascular/Lymphatic: Atherosclerotic calcifications within the abdominal aorta and branch vessels. Marked tortuosity of the abdominal aorta without frank aneurysm or ectasia. No enlarged abdominopelvic lymph nodes. Reproductive: Nyoka Lint defect in the region of the prostate, could reflect prior prostate intervention. Some mild thickening and heterogeneous enhancement of the prostate could reflect a concomitant prostatitis. Seminal vesicles are poorly visualized. Other: There is extensive thickening along the levator plate. Soft tissue thickening and subcutaneous phlegmon noted along the left gluteal cleft with overlying skin thickening. There is a multilobulated, thick-walled fluid collection extending from this region of phlegmon anteriorly to the base of the penile corpora the inferior extent of these collections and pudendal surfaces incompletely included within the margins of imaging. No soft tissue gas is seen though a necrotizing infection is not fully excluded on an imaging basis. Presacral fat stranding and perirectal thickening, as detailed above. Mild circumferential body wall edema.  Neurostimulator battery pack in the soft tissues of the right flank entering the canal at the T11 level. Terminating above the level of imaging. Musculoskeletal: The osseous structures appear diffusely demineralized which may limit detection of small or nondisplaced fractures. No acute or suspicious osseous lesions are evident. Severe dextrocurvature of the spine. Bony fusion across the L1-L4 levels with severe dextrocurvature at this site. Associated pelvic tilt is noted as well. There is a grade 2 anterolisthesis L5 on S1 and 7 mm of retrolisthesis L4 on L5. Bilateral L5 pars defects are noted. Severe resulting foraminal stenosis at this level.  Additional moderate canal impingement C3-4, C4-5. Few scattered benign bone islands. Prior left femoral intramedullary nail and transcervical pin placement traversing a healed deformity of a left intertrochanteric femur fracture. IMPRESSION: 1. Soft tissue thickening and subcutaneous phlegmon along the left gluteal cleft with overlying skin thickening. There is a multilobulated, thick-walled fluid collection extending from this region of phlegmon anteriorly to the base of the penile corpora. The inferior extent of these collections and pudendal surface is incompletely included within the margins of imaging. Associated thickening of the levator plate. A perianal abscess or fistula is not completely excluded. No soft tissue gas is seen though a necrotizing infection is not fully excluded on an imaging basis. Recommend correlation with direct visualization. 2. Large inspissated rectal stool ball with circumferential rectal wall thickening and inflammation as well as stranding in the presacral space. Findings are concerning for stercoral colitis. 3. Moderate bilateral hydroureteronephrosis without obstructing calculus. Circumferentially thickened urinary bladder with numerous bladder diverticula, suggestive of chronic outlet obstruction with possible superimposed cystitis.  Recommend correlation with urinalysis. 4. Keyhole defect in the prostate may reflect prior prostate resection. Heterogeneous appearance of the prostate with adjacent stranding, could reflect concomitant prostatitis. 5. Hyperdense left renal pyramids , a nonspecific finding but can be seen urinary obstruction, metabolic derangement or papillary necrosis among other etiologies. 6. Indeterminate heterogeneous lesion in the inferior right lobe liver measuring approximately 3.8 x 4.8 x 5.2 cm, incompletely characterized on noncontrast CT. Recommend further evaluation with nonemergent liver protocol CT or MRI. 7. Cholelithiasis without evidence of acute cholecystitis. 8. Severe dextrocurvature of the spine across fused L1-L4 levels. Spondylolysis and spondylolisthesis as described above. Severe bilateral foraminal narrowing L5-S1. Moderate canal narrowing L3-4, L4-5. 9. Aortic Atherosclerosis (ICD10-I70.0). These results were called by telephone at the time of interpretation on 07/08/2020 at 8:26 pm to provider Monette Omara YAO , who verbally acknowledged these results. Electronically Signed   By: Lovena Le M.D.   On: 07/08/2020 20:22   DG Chest 2 View  Result Date: 07/08/2020 CLINICAL DATA:  Cough for 2 weeks. EXAM: CHEST - 2 VIEW COMPARISON:  Single-view of the chest 10/11/2018. FINDINGS: The lungs are clear. Heart size is normal. Aortic atherosclerosis. No pneumothorax or pleural effusion. Cervical fusion hardware is unchanged. Inferior most screw on the left has partially backed out. Spinal stimulator noted. IMPRESSION: No acute disease. Aortic Atherosclerosis (ICD10-I70.0). Electronically Signed   By: Inge Rise M.D.   On: 07/08/2020 14:57   US Scrotum  Result Date: 07/08/2020 CLINICAL DATA:  86 year old with scrotal swelling. Rule out scrotal abscess. EXAM: ULTRASOUND OF SCROTUM TECHNIQUE: Complete ultrasound examination of the testicles, epididymis, and other scrotal structures was performed. COMPARISON:   Included portions from abdominopelvic CT earlier today. FINDINGS: Right testicle Measurements: 3.1 x 0.9 x 2.2 cm. No mass or microlithiasis visualized. Blood flow is noted. No intratesticular collection. There are extra testicular foci in the scrotum with posterior shadowing measuring up to 6 mm. No dirty shadowing to suggest air. Left testicle Measurements: 3.5 x 2.1 x 2.3 cm. No mass or microlithiasis visualized. Blood flow is noted. No intratesticular collection. There are extratesticular foci in the scrotum with posterior shadowing measuring up to 8 mm. No dirty shadowing to suggest air. Right epididymis:  Normal in size and appearance. Left epididymis:  Normal in size and appearance. Hydrocele:  Small on the right.  Fluid appears simple. Varicocele:  None visualized. Other: Diffuse bilateral skin and soft tissue thickening and edema measuring up to 2 cm. No discrete scrotal  fluid collection. IMPRESSION: 1. Diffuse scrotal skin and soft tissue thickening measuring up to 2 cm typical cellulitis. No discrete scrotal fluid collection. 2. Small right hydrocele. 3. Extra testicular foci with posterior shadowing measuring up to 6 mm on the right and 8 mm on the left. These are most typical of calcifications and likely postinflammatory. 4. Please note the area of phlegmonous change in the perineum on CT was not evaluated on this scrotal ultrasound. Electronically Signed   By: Keith Rake M.D.   On: 07/08/2020 20:43    EKG: Independently reviewed. Vent. rate 50 BPM PR interval 176 ms QRS duration 118 ms QT/QTc 490/446 ms P-R-T axes 59 24 81 Sinus bradycardia Incomplete right bundle branch block ST & T wave abnormality, consider anterior ischemia Abnormal ECG  Assessment/Plan Principal Problem:   Cellulitis of scrotum Admit to progressive unit/inpatient. Keep n.p.o. Analgesics as needed. Hold apixaban. Continue cefepime per pharmacy. Continue metronidazole 500 mg IVPB every 8  hours. Vancomycin per pharmacy. Follow-up blood culture and sensitivity. Will need surgical intervention (see notes by consultants)  Active Problems:   Acute UTI (urinary tract infection) On cefepime. Follow blood culture and sensitivity. Follow-up urine culture and sensitivity.    Sepsis due to undetermined organism POA ([place]) Secondary to outflow. Continue cefepime per pharmacy. Continue metronidazole 500 mg IVPB every 8 hours. Continue vancomycin per pharmacy. Follow-up cultures.    Obstructive uropathy due to   Bilateral hydronephrosis causing   AKI (acute kidney injury) ([place]) (The patient also takes torsemide) Continue Foley drainage Continue gentle IV hydration. Monitor intake and output. Treat scrotal cellulitis and cystitis. Follow renal function electrolytes.    Hyperkalemia Continue IV hydration. Follow-up potassium level.    Hypothyroidism Continue levothyroxine 25 mcg p.o. daily.    Hyperlipidemia Hold simvastatin.    Essential hypertension Hold torsemide.      Anxiety Continue mirtazapine 50 mg p.o. daily per Continue sertraline 75 mg p.o. daily    Dementia ([place]) Reorientation as needed. Continue mirtazapine and sertraline.    Stercoral colitis Continue MiraLAX and Senokot. May need to be treated with an enema.    On anticoagulation No diagnosis documented. Paroxysmal atrial fibrillation, PE, DVT, etc.? Has a previous EKG with significant sinus arrhythmia. Eliquis has been held pending procedure. Will try to obtain records from nursing home.    DVT prophylaxis: On full dose anticoagulation with Eliquis (held). Code Status:   Full code. Family Communication:   Disposition Plan:   Patient is from:  [facility] home.  Anticipated DC to:  [facility] nursing home.  Anticipated DC date:  07/16/2020.  Anticipated DC barriers: Clinical status.  Consults called:  General surgery.  Urology. Admission status:  Inpatient/progressive unit.    Severity of Illness: Very high given age, comorbidities along with sepsis due to cellulitis/UTI, obstructive uropathy with AKI, dementia, cachexia with severe malnutrition among others.  Reubin Milan MD [facility]  How to contact the [facility] Attending or Consulting provider [place] or covering provider during after hours [place], for this patient?   1. Check the care team in [facility] and look for a) attending/consulting [facility] provider listed and b) the [facility] team listed 2. Log into [email or web address] and use [place]'s universal password to access. If you do not have the password, please contact the hospital operator. 3. Locate the [facility] provider you are looking for under [facility] and page to a number that you can be directly reached. 4. If you still have difficulty reaching the  provider, please page the [facility] (Director on Call) for the Hospitalists listed on amion for assistance.  07/08/2020, 11:20 PM   This document was prepared using Dragon voice recognition software and may contain some unintended transcription errors.

## 2020-07-08 NOTE — Progress Notes (Signed)
Pharmacy Antibiotic Note  Wayne [name] is a 86 y.o. male admitted on 07/08/2020 with cellulitis.  Pharmacy has been consulted for cefepime dosing. Now adding vancomycin. Patient has received a dose of vancomycin 1 gm in the ED.   Patient is afebrile, WBC 13.9. Lactic acid 2.0. SCr elevated ~3.2 (unsure BL SCr, 1.7?), CrCl ~11.   Plan:  Cefepime 2g IV every 24 hours Further vanc dosing per levels Follow up with cultures, antibiotic de-escalation and LOT Monitor renal function and clinical progress Follow up with vancomycin dosing (one dose ordered by MD)  Height: 5\' 7"  (170.2 cm) Weight: 51.7 kg (114 lb) IBW/kg (Calculated) : 66.1  Temp (24hrs), Avg:98.1 F (36.7 C), Min:98.1 F (36.7 C), Max:98.1 F (36.7 C)  Recent Labs  Lab 07/08/20 1519 07/08/20 1528 07/08/20 1935  WBC  --  13.9*  --   CREATININE  --  3.23*  --   LATICACIDVEN 2.0*  --  1.6    Estimated Creatinine Clearance: 10.9 mL/min (A) (by C-G formula based on SCr of 3.23 mg/dL (H)).    Allergies  Allergen Reactions  . Doxazosin Mesylate Other (See Comments)    Makes blood pressure  . Propoxyphene Hcl Other (See Comments)    REACTION: upset stomach  . Penicillins Rash    DID THE REACTION INVOLVE: Swelling of the face/tongue/throat, SOB, or low BP? No Sudden or severe rash/hives, skin peeling, or the inside of the mouth or nose? Yes Did it require medical treatment? Yes When did it last happen?50 yrs ago If all above answers are "NO", may proceed with cephalosporin use.     Antimicrobials this admission: Cefepime 9/27 >> Vancomycin 9/27>>  Dose adjustments this admission: N/a  Microbiology results: 9/27 BCx: pending  Thank you for allowing pharmacy to be a part of this patient's care.  Albertina Parr, PharmD., BCPS, BCCCP Clinical Pharmacist Clinical phone for 07/08/20 until 11:30pm: [phone] If after 11:30pm, please refer to [facility] for unit-specific pharmacist

## 2020-07-08 NOTE — ED Triage Notes (Addendum)
Arrived from [facility] via EMS scrotum swelling and redness for "days" history of AMS. Has right IV hub saline locked right thigh.

## 2020-07-08 NOTE — Consult Note (Signed)
I have been asked to see the patient by Dr. Shirlyn Goltz, for evaluation and management of urinary retention with bilateral hydronephrosis and scrotal abscess.  History of present illness:    Review of systems: A 12 point comprehensive review of systems was obtained and is negative unless otherwise stated in the history of present illness.  Patient Active Problem List   Diagnosis Date Noted   Hip fracture, unspecified laterality, closed, initial encounter ([place]) 10/11/2018   Hip fracture ([place]) 10/11/2018   Closed intertrochanteric fracture of left femur ([place]) 10/11/2018   Viral URI with cough 10/20/2017   Pneumonia of left lower lobe due to infectious organism 10/20/2017   Renal insufficiency 01/25/2012   Insomnia 12/21/2011   Venous insufficiency 08/24/2011   ALLERGIC RHINITIS 12/10/2008   DERMATITIS, CONTACT, NOS 08/11/2007   Short-term memory loss 08/02/2007   Hypothyroidism 04/07/2007   Hyperlipidemia 04/07/2007   Essential hypertension 04/07/2007    No current facility-administered medications on file prior to encounter.   Current Outpatient Medications on File Prior to Encounter  Medication Sig Dispense Refill   aspirin EC 81 MG tablet Take 81 mg by mouth daily.     atenolol (TENORMIN) 50 MG tablet TAKE 1 TABLET BY MOUTH EVERY DAY 90 tablet 1   donepezil (ARICEPT) 5 MG tablet TAKE 1 TABLET BY MOUTH EVERYDAY AT BEDTIME 90 tablet 1   levothyroxine (SYNTHROID) 50 MCG tablet TAKE 1 TABLET BY MOUTH DAILY BEFORE BREAKFAST 90 tablet 1   LORazepam (ATIVAN) 0.5 MG tablet Take 1 tablet (0.5 mg total) by mouth at bedtime as needed for sleep. 90 tablet 0   polyethylene glycol (MIRALAX / GLYCOLAX) packet Take 17 g by mouth daily as needed for mild constipation. 14 each 0   senna-docusate (SENOKOT-S) 8.6-50 MG tablet Take 1 tablet by mouth at bedtime as needed for mild constipation.     simvastatin (ZOCOR) 40 MG tablet TAKE 1 TABLET BY MOUTH EVERY DAY AT NIGHT 90 tablet  1   torsemide (DEMADEX) 20 MG tablet TAKE 1 TABLET BY MOUTH EVERY DAY 90 tablet 1    Past Medical History:  Diagnosis Date   Anxiety    takes Atrivan daily   Back pain    chronic with neck pain   Cancer (HCC)    skin   CHF (congestive heart failure) (HCC)    Constipation    related to medication   Dementia ([place])    takes Aricept nightly   Enlarged prostate    self caths once every 1-2wks   GERD (gastroesophageal reflux disease)    doesn't require medication   Hx of seasonal allergies    Hyperlipidemia    takes Simvastatin daily   Hypertension    takes atenolol daily   Hypothyroidism    takes Synthroid daily   Insomnia    related to pain   MRSA (methicillin resistant staph aureus) culture positive    Per patient tested on 10/07/11.   Osteoporosis    Peripheral edema    takes Torsemide daily   Scoliosis     Past Surgical History:  Procedure Laterality Date   abdominal cyst     removed-2012;MRSA done by Dr.Gross   CATARACT EXTRACTION     cataracts     bilateral   CERVICAL DISC SURGERY     x1   COLONOSCOPY     ESOPHAGOGASTRODUODENOSCOPY     HERNIA REPAIR     Right inguinal   INTRAMEDULLARY (IM) NAIL INTERTROCHANTERIC Left 10/12/2018   INTRAMEDULLARY (IM)  NAIL INTERTROCHANTERIC Left 10/12/2018   Procedure: INTRAMEDULLARY (IM) NAIL INTERTROCHANTRIC;  Surgeon: Renette Butters, MD;  Location: [facility];  Service: Orthopedics;  Laterality: Left;   LUMBAR DISC SURGERY     x2   TOE SURGERY     right great toe   TRANSURETHRAL RESECTION OF PROSTATE     15+yrs ago    Social History   Tobacco Use   Smoking status: Never Smoker   Smokeless tobacco: Never Used  Vaping Use   Vaping Use: Never used  Substance Use Topics   Alcohol use: No   Drug use: No    Family History  Problem Relation Age of Onset   Heart disease Mother 44       Vague history   Stroke Father    Hypertension Other        Multiple family members both  sides   Diabetes Other    Anesthesia problems Neg Hx    Hypotension Neg Hx    Malignant hyperthermia Neg Hx    Pseudochol deficiency Neg Hx     PE: Vitals:   07/08/20 1406 07/08/20 1412 07/08/20 1414 07/08/20 1900  BP:   103/77   Pulse:   95   Resp:   16   Temp:   98.1 F (36.7 C)   TempSrc:   Oral   SpO2: 96%  99%   Weight:  54.4 kg  51.7 kg  Height:    5\' 7"  (1.702 m)   Patient appears to be in no acute distress  patient is lethargic but responds appropriately Atraumatic normocephalic head No increased work of breathing, no audible wheezes/rhonchi Regular sinus rhythm/rate Abdomen is soft, palpable bladder GU circ phallus, severely edematous scrotum with necrotic skin overlaying inferior scrotum and weeping/draining Lower extremities are symmetric without appreciable edema Grossly neurologically intact No identifiable skin lesions  Recent Labs    07/08/20 1528  WBC 13.9*  HGB 10.6*  HCT 33.3*   Recent Labs    07/08/20 1528  NA 134*  K 5.2*  CL 106  CO2 16*  GLUCOSE 114*  BUN 126*  CREATININE 3.23*  CALCIUM 8.9   Recent Labs    07/08/20 1528  INR 1.9*   No results for input(s): LABURIN in the last 72 hours. Results for orders placed or performed during the hospital encounter of 10/11/18  Surgical pcr screen     Status: None   Collection Time: 10/11/18 10:52 PM   Specimen: Nasal Mucosa; Nasal Swab  Result Value Ref Range Status   MRSA, PCR NEGATIVE NEGATIVE Final   Staphylococcus aureus NEGATIVE NEGATIVE Final    Comment: (NOTE) The Xpert SA Assay (FDA approved for NASAL specimens in patients 64 years of age and older), is one component of a comprehensive surveillance program. It is not intended to diagnose infection nor to guide or monitor treatment. Performed at [facility], [place] [address] [place], [place] [address]     Imaging: Scrotal US 07/08/20 IMPRESSION: 1. Diffuse scrotal skin and soft tissue thickening measuring up to  2 cm typical cellulitis. No discrete scrotal fluid collection. 2. Small right hydrocele. 3. Extra testicular foci with posterior shadowing measuring up to 6 mm on the right and 8 mm on the left. These are most typical of calcifications and likely postinflammatory. 4. Please note the area of phlegmonous change in the perineum on CT was not evaluated on this scrotal ultrasound.  CT Scan 07/08/20 IMPRESSION: 1. Soft tissue thickening and subcutaneous phlegmon  along the left gluteal cleft with overlying skin thickening. There is a multilobulated, thick-walled fluid collection extending from this region of phlegmon anteriorly to the base of the penile corpora. The inferior extent of these collections and pudendal surface is incompletely included within the margins of imaging. Associated thickening of the levator plate. A perianal abscess or fistula is not completely excluded. No soft tissue gas is seen though a necrotizing infection is not fully excluded on an imaging basis. Recommend correlation with direct visualization. 2. Large inspissated rectal stool ball with circumferential rectal wall thickening and inflammation as well as stranding in the presacral space. Findings are concerning for stercoral colitis. 3. Moderate bilateral hydroureteronephrosis without obstructing calculus. Circumferentially thickened urinary bladder with numerous bladder diverticula, suggestive of chronic outlet obstruction with possible superimposed cystitis. Recommend correlation with urinalysis. 4. Keyhole defect in the prostate may reflect prior prostate resection. Heterogeneous appearance of the prostate with adjacent stranding, could reflect concomitant prostatitis. 5. Hyperdense left renal pyramids , a nonspecific finding but can be seen urinary obstruction, metabolic derangement or papillary necrosis among other etiologies. 6. Indeterminate heterogeneous lesion in the inferior right lobe liver  measuring approximately 3.8 x 4.8 x 5.2 cm, incompletely characterized on noncontrast CT. Recommend further evaluation with nonemergent liver protocol CT or MRI. 7. Cholelithiasis without evidence of acute cholecystitis. 8. Severe dextrocurvature of the spine across fused L1-L4 levels. Spondylolysis and spondylolisthesis as described above. Severe bilateral foraminal narrowing L5-S1. Moderate canal narrowing L3-4, L4-5. 9. Aortic Atherosclerosis (ICD10-I70.0).  Imp: 86 year old man with known urethral stricture last seen by [facility] in August 2020 who comes in from nursing facility with acute urinary retention, AKI with creatinine of 3.0, and concern for scrotal/perineal abscesses.  1.  Acute urinary retention with bilateral hydronephrosis:  Procedure: Cystourethroscopy with urethral stricture balloon dilation  Preprocedure diagnosis: Urethral stricture Post procedure diagnosis: Urethral stricture  Indication for procedure: 86 year old man with a history of a known dense bulbar urethral stricture last dilated August 2020 presented to ER with acute urinary retention, AKI and multiple scrotal/perineal abscesses.  Procedure in detail: Patient was prepped and draped in usual sterile fashion.  His son was present and procedure was discussed with him prior to starting.  39 French flexible cystoscope was placed in the urethral meatus and advanced until the stricture was seen in the proximal bulbar urethra.  The scope was unable to traverse this area and it was very dense.  A 0.38 wire was then advanced through the small opening into the bladder.  The cystoscope was removed and the Ultraxx balloon dilator was then used to dilate the stricture.  The balloon dilator was deflated and removed.  An eighteen [place] [employer] tip Foley was then placed over a wire without difficulty into the bladder.  10 cc of water were used to inflate the balloon.  800 cc of purulent urine then drained.    2.   Scrotal abscess: Patient scrotum was severely edematous with superficial skin sloughing and inferior pockets of necrotic tissue.  Gentle manual manipulation of the area opened up the tissue with drainage of fluid/possibly infection.  The area was debrided and opened up tracking towards the perineum.  Bilateral testes do not appear to be involved. Saline soaked 4 x 4's were then placed in the open area.  Patient was examined with general surgery and we discussed taking patient to operating room tomorrow for further debridement.  Would like for him to continue gram-negative, gram-positive, and anaerobic antimicrobial coverage.   3.  Urethral stricture: Continue Foley catheter for at least 7 days  Thank you for involving me in this patient's care,. Please page with any further questions or concerns. Wayne [name] D Wayne [name]

## 2020-07-08 NOTE — Consult Note (Signed)
CC: Scrotal pain, possible scrotal + perineal abscess  Requesting provider: Dr. Darl Householder  HPI: Wayne [name] is an 86 y.o. male with hx of Dementia, HTN, CHF, HLD, BPH, uretheral stricture (s/p serial dilations as outpt with Dr. Karsten Ro), on Eliquis - presents from [facility] with large swollen scrotum and concerns for scrotal abscess as well as urinary retention.  His son is at his bedside with him this evening and reports that all he was asking for was to see Dr. Karsten Ro.  History is quite limited due to his underlying dementia.  He underwent evaluation in the emergency department and we were subsequently asked to see alongside urology.  Past Medical History:  Diagnosis Date  . Anxiety    takes Atrivan daily  . Back pain    chronic with neck pain  . Cancer ([place])    skin  . CHF (congestive heart failure) ([place])   . Constipation    related to medication  . Dementia ([place])    takes Aricept nightly  . Enlarged prostate    self caths once every 1-2wks  . GERD (gastroesophageal reflux disease)    doesn't require medication  . Hx of seasonal allergies   . Hyperlipidemia    takes Simvastatin daily  . Hypertension    takes atenolol daily  . Hypothyroidism    takes Synthroid daily  . Insomnia    related to pain  . MRSA (methicillin resistant staph aureus) culture positive    Per patient tested on 10/07/11.  . Osteoporosis   . Peripheral edema    takes Torsemide daily  . Scoliosis     Past Surgical History:  Procedure Laterality Date  . abdominal cyst     removed-2012;MRSA done by Dr.Gross  . CATARACT EXTRACTION    . cataracts     bilateral  . CERVICAL DISC SURGERY     x1  . COLONOSCOPY    . ESOPHAGOGASTRODUODENOSCOPY    . HERNIA REPAIR     Right inguinal  . INTRAMEDULLARY (IM) NAIL INTERTROCHANTERIC Left 10/12/2018  . INTRAMEDULLARY (IM) NAIL INTERTROCHANTERIC Left 10/12/2018   Procedure: INTRAMEDULLARY (IM) NAIL INTERTROCHANTRIC;  Surgeon: Renette Butters, MD;   Location: [facility];  Service: Orthopedics;  Laterality: Left;  . LUMBAR DISC SURGERY     x2  . TOE SURGERY     right great toe  . TRANSURETHRAL RESECTION OF PROSTATE     15+yrs ago    Family History  Problem Relation Age of Onset  . Heart disease Mother 34       Vague history  . Stroke Father   . Hypertension Other        Multiple family members both sides  . Diabetes Other   . Anesthesia problems Neg Hx   . Hypotension Neg Hx   . Malignant hyperthermia Neg Hx   . Pseudochol deficiency Neg Hx     Social:  reports that he has never smoked. He has never used smokeless tobacco. He reports that he does not drink alcohol and does not use drugs.  Allergies:  Allergies  Allergen Reactions  . Doxazosin Mesylate Other (See Comments)    Makes blood pressure  . Propoxyphene Hcl Other (See Comments)    REACTION: upset stomach  . Penicillins Rash    DID THE REACTION INVOLVE: Swelling of the face/tongue/throat, SOB, or low BP? No Sudden or severe rash/hives, skin peeling, or the inside of the mouth or nose? Yes Did it require medical treatment? Yes When did  it last happen?50 yrs ago If all above answers are "NO", may proceed with cephalosporin use.     Medications: I have reviewed the patient's current medications.  Results for orders placed or performed during the hospital encounter of 07/08/20 (from the past 48 hour(s))  Lactic acid, plasma     Status: Abnormal   Collection Time: 07/08/20  3:19 PM  Result Value Ref Range   Lactic Acid, Venous 2.0 (HH) 0.5 - 1.9 mmol/L    Comment: CRITICAL RESULT CALLED TO, READ BACK BY AND VERIFIED WITH: Marikay Alar 1611 07/08/2020 WBOND Performed at [facility], [place] [address] [place], [place] [address]   Comprehensive metabolic panel     Status: Abnormal   Collection Time: 07/08/20  3:28 PM  Result Value Ref Range   Sodium 134 (L) 135 - 145 mmol/L   Potassium 5.2 (H) 3.5 - 5.1 mmol/L   Chloride 106 98 - 111 mmol/L   CO2 16 (L)  22 - 32 mmol/L   Glucose, Bld 114 (H) 70 - 99 mg/dL    Comment: Glucose reference range applies only to samples taken after fasting for at least 8 hours.   BUN 126 (H) 8 - 23 mg/dL   Creatinine, Ser 3.23 (H) 0.61 - 1.24 mg/dL   Calcium 8.9 8.9 - 10.3 mg/dL   Total Protein 6.6 6.5 - 8.1 g/dL   Albumin 2.0 (L) 3.5 - 5.0 g/dL   AST 44 (H) 15 - 41 U/L   ALT 45 (H) 0 - 44 U/L   Alkaline Phosphatase 213 (H) 38 - 126 U/L   Total Bilirubin 0.6 0.3 - 1.2 mg/dL   GFR calc non Af Amer 16 (L) >60 mL/min   GFR calc Af Amer 18 (L) >60 mL/min   Anion gap 12 5 - 15    Comment: Performed at [facility] [address] [place], [place] [address]  CBC with Differential     Status: Abnormal   Collection Time: 07/08/20  3:28 PM  Result Value Ref Range   WBC 13.9 (H) 4.0 - 10.5 K/uL   RBC 3.71 (L) 4.22 - 5.81 MIL/uL   Hemoglobin 10.6 (L) 13.0 - 17.0 g/dL   HCT 33.3 (L) 39 - 52 %   MCV 89.8 80.0 - 100.0 fL   MCH 28.6 26.0 - 34.0 pg   MCHC 31.8 30.0 - 36.0 g/dL   RDW 14.5 11.5 - 15.5 %   Platelets 380 150 - 400 K/uL   nRBC 0.0 0.0 - 0.2 %   Neutrophils Relative % 91 %   Neutro Abs 12.6 (H) 1.7 - 7.7 K/uL   Lymphocytes Relative 7 %   Lymphs Abs 1.0 0.7 - 4.0 K/uL   Monocytes Relative 2 %   Monocytes Absolute 0.3 0 - 1 K/uL   Eosinophils Relative 0 %   Eosinophils Absolute 0.0 0 - 0 K/uL   Basophils Relative 0 %   Basophils Absolute 0.0 0 - 0 K/uL   nRBC 0 0 /100 WBC   Abs Immature Granulocytes 0.00 0.00 - 0.07 K/uL    Comment: Performed at [facility], [place] [address], [place], [place] [address]  Protime-INR     Status: Abnormal   Collection Time: 07/08/20  3:28 PM  Result Value Ref Range   Prothrombin Time 21.0 (H) 11.4 - 15.2 seconds   INR 1.9 (H) 0.8 - 1.2    Comment: (NOTE) INR goal varies based on device and disease states.  Performed at [facility], [place] [address], [place], [place] [address]   Lactic acid, plasma     Status: None   Collection Time: 07/08/20  7:35  PM  Result Value Ref Range   Lactic Acid, Venous 1.6 0.5 - 1.9 mmol/L    Comment: Performed at [facility] [address] [place], [place] [address]    CT ABDOMEN PELVIS WO CONTRAST  Result Date: 07/08/2020 CLINICAL DATA:  Concern for scrotal abscess/Fournier's gangrene EXAM: CT ABDOMEN AND PELVIS WITHOUT CONTRAST TECHNIQUE: Multidetector CT imaging of the abdomen and pelvis was performed following the standard protocol without IV contrast. COMPARISON:  CT L-spine 07/20/2011, chest radiograph 07/08/2020 FINDINGS: Lower chest: Chronic appearing chest wall deformity is noted likely related to severe scoliotic curvature. There are bandlike and consolidative opacities in the bilateral lung bases centered upon thickened lower lobe airways bilaterally which could reflect some atelectatic change and/or scarring though sequela of aspiration or infection could have a similar appearance. Normal heart size. No pericardial effusion. Three-vessel coronary artery atherosclerosis is noted. Hypoattenuation of the cardiac blood pool rib likely reflecting some mild anemia. Atherosclerotic calcification in the visible portions of the thoracic aorta. Hepatobiliary: Incompletely characterized peripherally hypoattenuating, centrally isoattenuating lesion in the inferior right lobe liver measuring approximately 3.8 x 4.8 x 5.2 cm (6/54, 3/24). No other focal concerning liver lesions. Gallbladder distention is top-normal. Partially calcified gallstone at the gallbladder neck. No pericholecystic fluid or inflammation. No biliary ductal dilatation or intraductal gallstones. Pancreas: Partial fatty replacement of the pancreas. No pancreatic ductal dilatation or surrounding inflammatory changes. Spleen: Normal in size. No concerning splenic lesions. Adrenals/Urinary Tract: Normal adrenal glands. Kidneys are symmetric in size and normally located. There are scattered subcentimeter hypoattenuating foci in both kidneys too small  to fully characterize on CT imaging but statistically likely benign. Hyperdense renal pyramids left, a nonspecific finding but can be seen urinary obstruction, metabolic derangement or papillary necrosis among other etiologies. There is moderate bilateral hydroureteronephrosis without obstructing calculus. Urinary bladder is circumferentially thickened with numerous bladder diverticula. Stomach/Bowel: Distal esophagus, stomach and duodenal sweep are unremarkable. No small bowel wall thickening or dilatation. No evidence of obstruction. A normal appendix is visualized. Interposition of the hepatic flexure anterior to the liver. There is a large inspissated rectal stool ball with circumferential rectal wall thickening and inflammation as well as stranding in the presacral space. Marked thickening is also seen along the levator plate. Feculent material noted within the gluteal cleft. Vascular/Lymphatic: Atherosclerotic calcifications within the abdominal aorta and branch vessels. Marked tortuosity of the abdominal aorta without frank aneurysm or ectasia. No enlarged abdominopelvic lymph nodes. Reproductive: Nyoka Lint defect in the region of the prostate, could reflect prior prostate intervention. Some mild thickening and heterogeneous enhancement of the prostate could reflect a concomitant prostatitis. Seminal vesicles are poorly visualized. Other: There is extensive thickening along the levator plate. Soft tissue thickening and subcutaneous phlegmon noted along the left gluteal cleft with overlying skin thickening. There is a multilobulated, thick-walled fluid collection extending from this region of phlegmon anteriorly to the base of the penile corpora the inferior extent of these collections and pudendal surfaces incompletely included within the margins of imaging. No soft tissue gas is seen though a necrotizing infection is not fully excluded on an imaging basis. Presacral fat stranding and perirectal thickening, as  detailed above. Mild circumferential body wall edema. Neurostimulator battery pack in the soft tissues of the right flank entering the canal at the T11 level. Terminating  above the level of imaging. Musculoskeletal: The osseous structures appear diffusely demineralized which may limit detection of small or nondisplaced fractures. No acute or suspicious osseous lesions are evident. Severe dextrocurvature of the spine. Bony fusion across the L1-L4 levels with severe dextrocurvature at this site. Associated pelvic tilt is noted as well. There is a grade 2 anterolisthesis L5 on S1 and 7 mm of retrolisthesis L4 on L5. Bilateral L5 pars defects are noted. Severe resulting foraminal stenosis at this level. Additional moderate canal impingement C3-4, C4-5. Few scattered benign bone islands. Prior left femoral intramedullary nail and transcervical pin placement traversing a healed deformity of a left intertrochanteric femur fracture. IMPRESSION: 1. Soft tissue thickening and subcutaneous phlegmon along the left gluteal cleft with overlying skin thickening. There is a multilobulated, thick-walled fluid collection extending from this region of phlegmon anteriorly to the base of the penile corpora. The inferior extent of these collections and pudendal surface is incompletely included within the margins of imaging. Associated thickening of the levator plate. A perianal abscess or fistula is not completely excluded. No soft tissue gas is seen though a necrotizing infection is not fully excluded on an imaging basis. Recommend correlation with direct visualization. 2. Large inspissated rectal stool ball with circumferential rectal wall thickening and inflammation as well as stranding in the presacral space. Findings are concerning for stercoral colitis. 3. Moderate bilateral hydroureteronephrosis without obstructing calculus. Circumferentially thickened urinary bladder with numerous bladder diverticula, suggestive of chronic outlet  obstruction with possible superimposed cystitis. Recommend correlation with urinalysis. 4. Keyhole defect in the prostate may reflect prior prostate resection. Heterogeneous appearance of the prostate with adjacent stranding, could reflect concomitant prostatitis. 5. Hyperdense left renal pyramids , a nonspecific finding but can be seen urinary obstruction, metabolic derangement or papillary necrosis among other etiologies. 6. Indeterminate heterogeneous lesion in the inferior right lobe liver measuring approximately 3.8 x 4.8 x 5.2 cm, incompletely characterized on noncontrast CT. Recommend further evaluation with nonemergent liver protocol CT or MRI. 7. Cholelithiasis without evidence of acute cholecystitis. 8. Severe dextrocurvature of the spine across fused L1-L4 levels. Spondylolysis and spondylolisthesis as described above. Severe bilateral foraminal narrowing L5-S1. Moderate canal narrowing L3-4, L4-5. 9. Aortic Atherosclerosis (ICD10-I70.0). These results were called by telephone at the time of interpretation on 07/08/2020 at 8:26 pm to provider DAVID YAO , who verbally acknowledged these results. Electronically Signed   By: Lovena Le M.D.   On: 07/08/2020 20:22   DG Chest 2 View  Result Date: 07/08/2020 CLINICAL DATA:  Cough for 2 weeks. EXAM: CHEST - 2 VIEW COMPARISON:  Single-view of the chest 10/11/2018. FINDINGS: The lungs are clear. Heart size is normal. Aortic atherosclerosis. No pneumothorax or pleural effusion. Cervical fusion hardware is unchanged. Inferior most screw on the left has partially backed out. Spinal stimulator noted. IMPRESSION: No acute disease. Aortic Atherosclerosis (ICD10-I70.0). Electronically Signed   By: Inge Rise M.D.   On: 07/08/2020 14:57   US Scrotum  Result Date: 07/08/2020 CLINICAL DATA:  86 year old with scrotal swelling. Rule out scrotal abscess. EXAM: ULTRASOUND OF SCROTUM TECHNIQUE: Complete ultrasound examination of the testicles, epididymis, and  other scrotal structures was performed. COMPARISON:  Included portions from abdominopelvic CT earlier today. FINDINGS: Right testicle Measurements: 3.1 x 0.9 x 2.2 cm. No mass or microlithiasis visualized. Blood flow is noted. No intratesticular collection. There are extra testicular foci in the scrotum with posterior shadowing measuring up to 6 mm. No dirty shadowing to suggest air. Left testicle Measurements: 3.5 x 2.1 x  2.3 cm. No mass or microlithiasis visualized. Blood flow is noted. No intratesticular collection. There are extratesticular foci in the scrotum with posterior shadowing measuring up to 8 mm. No dirty shadowing to suggest air. Right epididymis:  Normal in size and appearance. Left epididymis:  Normal in size and appearance. Hydrocele:  Small on the right.  Fluid appears simple. Varicocele:  None visualized. Other: Diffuse bilateral skin and soft tissue thickening and edema measuring up to 2 cm. No discrete scrotal fluid collection. IMPRESSION: 1. Diffuse scrotal skin and soft tissue thickening measuring up to 2 cm typical cellulitis. No discrete scrotal fluid collection. 2. Small right hydrocele. 3. Extra testicular foci with posterior shadowing measuring up to 6 mm on the right and 8 mm on the left. These are most typical of calcifications and likely postinflammatory. 4. Please note the area of phlegmonous change in the perineum on CT was not evaluated on this scrotal ultrasound. Electronically Signed   By: Keith Rake M.D.   On: 07/08/2020 20:43    ROS - all of the below systems have been reviewed with the patient and positives are indicated with bold text General: chills, fever or night sweats Eyes: blurry vision or double vision ENT: epistaxis or sore throat Allergy/Immunology: itchy/watery eyes or nasal congestion Hematologic/Lymphatic: bleeding problems, blood clots or swollen lymph nodes Endocrine: temperature intolerance or unexpected weight changes Breast: new or changing  breast lumps or nipple discharge Resp: cough, shortness of breath, or wheezing CV: chest pain or dyspnea on exertion GI: as per HPI GU: dysuria, trouble voiding, or hematuria MSK: joint pain or joint stiffness Neuro: TIA or stroke symptoms Derm: pruritus and skin lesion changes Psych: anxiety and depression  PE Blood pressure (!) 95/56, pulse 91, temperature 98.1 F (36.7 C), temperature source Oral, resp. rate 16, height 5\' 7"  (1.702 m), weight 51.7 kg, SpO2 98 %. Constitutional: NAD; conversant; no deformities Eyes: Moist conjunctiva; no lid lag; anicteric; PERRL Neck: Trachea midline; no thyromegaly Lungs: Normal respiratory effort; no tactile fremitus CV: regular rate; no palpable thrills; no pitting edema GI: Abd soft, nontender, nondistended; no palpable hepatosplenomegaly MSK: Limited range of motion of extremities due to contractures; no clubbing/cyanosis GU: Foley placed by urology with frank pus in urine; significant scrotal swelling with sloughing soft tissues and purulent edema weeping. No foul odor. Phlegmon without fluctuance extending from scrotum into perineum. No gangrenous skin or devitalized skin in perineum. Some blanching erythema Psychiatric: Appropriate affect; alert and oriented x3 Lymphatic: No palpable cervical or axillary lymphadenopathy  Results for orders placed or performed during the hospital encounter of 07/08/20 (from the past 48 hour(s))  Lactic acid, plasma     Status: Abnormal   Collection Time: 07/08/20  3:19 PM  Result Value Ref Range   Lactic Acid, Venous 2.0 (HH) 0.5 - 1.9 mmol/L    Comment: CRITICAL RESULT CALLED TO, READ BACK BY AND VERIFIED WITH: K PATE,RN 1611 07/08/2020 WBOND Performed at [facility], [place] [address], [place], [place] [address]   Comprehensive metabolic panel     Status: Abnormal   Collection Time: 07/08/20  3:28 PM  Result Value Ref Range   Sodium 134 (L) 135 - 145 mmol/L   Potassium 5.2 (H) 3.5 - 5.1 mmol/L    Chloride 106 98 - 111 mmol/L   CO2 16 (L) 22 - 32 mmol/L   Glucose, Bld 114 (H) 70 - 99 mg/dL    Comment: Glucose reference range applies only to samples taken after fasting  for at least 8 hours.   BUN 126 (H) 8 - 23 mg/dL   Creatinine, Ser 3.23 (H) 0.61 - 1.24 mg/dL   Calcium 8.9 8.9 - 10.3 mg/dL   Total Protein 6.6 6.5 - 8.1 g/dL   Albumin 2.0 (L) 3.5 - 5.0 g/dL   AST 44 (H) 15 - 41 U/L   ALT 45 (H) 0 - 44 U/L   Alkaline Phosphatase 213 (H) 38 - 126 U/L   Total Bilirubin 0.6 0.3 - 1.2 mg/dL   GFR calc non Af Amer 16 (L) >60 mL/min   GFR calc Af Amer 18 (L) >60 mL/min   Anion gap 12 5 - 15    Comment: Performed at [facility] [address] [place], [place] [address]  CBC with Differential     Status: Abnormal   Collection Time: 07/08/20  3:28 PM  Result Value Ref Range   WBC 13.9 (H) 4.0 - 10.5 K/uL   RBC 3.71 (L) 4.22 - 5.81 MIL/uL   Hemoglobin 10.6 (L) 13.0 - 17.0 g/dL   HCT 33.3 (L) 39 - 52 %   MCV 89.8 80.0 - 100.0 fL   MCH 28.6 26.0 - 34.0 pg   MCHC 31.8 30.0 - 36.0 g/dL   RDW 14.5 11.5 - 15.5 %   Platelets 380 150 - 400 K/uL   nRBC 0.0 0.0 - 0.2 %   Neutrophils Relative % 91 %   Neutro Abs 12.6 (H) 1.7 - 7.7 K/uL   Lymphocytes Relative 7 %   Lymphs Abs 1.0 0.7 - 4.0 K/uL   Monocytes Relative 2 %   Monocytes Absolute 0.3 0 - 1 K/uL   Eosinophils Relative 0 %   Eosinophils Absolute 0.0 0 - 0 K/uL   Basophils Relative 0 %   Basophils Absolute 0.0 0 - 0 K/uL   nRBC 0 0 /100 WBC   Abs Immature Granulocytes 0.00 0.00 - 0.07 K/uL    Comment: Performed at [facility], [place] [address], [place], [place] [address]  Protime-INR     Status: Abnormal   Collection Time: 07/08/20  3:28 PM  Result Value Ref Range   Prothrombin Time 21.0 (H) 11.4 - 15.2 seconds   INR 1.9 (H) 0.8 - 1.2    Comment: (NOTE) INR goal varies based on device and disease states. Performed at [facility], [place] [address] [place], [place] [address]   Lactic acid, plasma      Status: None   Collection Time: 07/08/20  7:35 PM  Result Value Ref Range   Lactic Acid, Venous 1.6 0.5 - 1.9 mmol/L    Comment: Performed at [facility] [address], [place], [place] [address]    CT ABDOMEN PELVIS WO CONTRAST  Result Date: 07/08/2020 CLINICAL DATA:  Concern for scrotal abscess/Fournier's gangrene EXAM: CT ABDOMEN AND PELVIS WITHOUT CONTRAST TECHNIQUE: Multidetector CT imaging of the abdomen and pelvis was performed following the standard protocol without IV contrast. COMPARISON:  CT L-spine 07/20/2011, chest radiograph 07/08/2020 FINDINGS: Lower chest: Chronic appearing chest wall deformity is noted likely related to severe scoliotic curvature. There are bandlike and consolidative opacities in the bilateral lung bases centered upon thickened lower lobe airways bilaterally which could reflect some atelectatic change and/or scarring though sequela of aspiration or infection could have a similar appearance. Normal heart size. No pericardial effusion. Three-vessel coronary artery atherosclerosis is noted. Hypoattenuation of the cardiac blood pool rib likely reflecting some mild anemia. Atherosclerotic  calcification in the visible portions of the thoracic aorta. Hepatobiliary: Incompletely characterized peripherally hypoattenuating, centrally isoattenuating lesion in the inferior right lobe liver measuring approximately 3.8 x 4.8 x 5.2 cm (6/54, 3/24). No other focal concerning liver lesions. Gallbladder distention is top-normal. Partially calcified gallstone at the gallbladder neck. No pericholecystic fluid or inflammation. No biliary ductal dilatation or intraductal gallstones. Pancreas: Partial fatty replacement of the pancreas. No pancreatic ductal dilatation or surrounding inflammatory changes. Spleen: Normal in size. No concerning splenic lesions. Adrenals/Urinary Tract: Normal adrenal glands. Kidneys are symmetric in size and normally located. There are scattered subcentimeter  hypoattenuating foci in both kidneys too small to fully characterize on CT imaging but statistically likely benign. Hyperdense renal pyramids left, a nonspecific finding but can be seen urinary obstruction, metabolic derangement or papillary necrosis among other etiologies. There is moderate bilateral hydroureteronephrosis without obstructing calculus. Urinary bladder is circumferentially thickened with numerous bladder diverticula. Stomach/Bowel: Distal esophagus, stomach and duodenal sweep are unremarkable. No small bowel wall thickening or dilatation. No evidence of obstruction. A normal appendix is visualized. Interposition of the hepatic flexure anterior to the liver. There is a large inspissated rectal stool ball with circumferential rectal wall thickening and inflammation as well as stranding in the presacral space. Marked thickening is also seen along the levator plate. Feculent material noted within the gluteal cleft. Vascular/Lymphatic: Atherosclerotic calcifications within the abdominal aorta and branch vessels. Marked tortuosity of the abdominal aorta without frank aneurysm or ectasia. No enlarged abdominopelvic lymph nodes. Reproductive: Nyoka Lint defect in the region of the prostate, could reflect prior prostate intervention. Some mild thickening and heterogeneous enhancement of the prostate could reflect a concomitant prostatitis. Seminal vesicles are poorly visualized. Other: There is extensive thickening along the levator plate. Soft tissue thickening and subcutaneous phlegmon noted along the left gluteal cleft with overlying skin thickening. There is a multilobulated, thick-walled fluid collection extending from this region of phlegmon anteriorly to the base of the penile corpora the inferior extent of these collections and pudendal surfaces incompletely included within the margins of imaging. No soft tissue gas is seen though a necrotizing infection is not fully excluded on an imaging basis.  Presacral fat stranding and perirectal thickening, as detailed above. Mild circumferential body wall edema. Neurostimulator battery pack in the soft tissues of the right flank entering the canal at the T11 level. Terminating above the level of imaging. Musculoskeletal: The osseous structures appear diffusely demineralized which may limit detection of small or nondisplaced fractures. No acute or suspicious osseous lesions are evident. Severe dextrocurvature of the spine. Bony fusion across the L1-L4 levels with severe dextrocurvature at this site. Associated pelvic tilt is noted as well. There is a grade 2 anterolisthesis L5 on S1 and 7 mm of retrolisthesis L4 on L5. Bilateral L5 pars defects are noted. Severe resulting foraminal stenosis at this level. Additional moderate canal impingement C3-4, C4-5. Few scattered benign bone islands. Prior left femoral intramedullary nail and transcervical pin placement traversing a healed deformity of a left intertrochanteric femur fracture. IMPRESSION: 1. Soft tissue thickening and subcutaneous phlegmon along the left gluteal cleft with overlying skin thickening. There is a multilobulated, thick-walled fluid collection extending from this region of phlegmon anteriorly to the base of the penile corpora. The inferior extent of these collections and pudendal surface is incompletely included within the margins of imaging. Associated thickening of the levator plate. A perianal abscess or fistula is not completely excluded. No soft tissue gas is seen though a necrotizing infection is not fully  excluded on an imaging basis. Recommend correlation with direct visualization. 2. Large inspissated rectal stool ball with circumferential rectal wall thickening and inflammation as well as stranding in the presacral space. Findings are concerning for stercoral colitis. 3. Moderate bilateral hydroureteronephrosis without obstructing calculus. Circumferentially thickened urinary bladder with  numerous bladder diverticula, suggestive of chronic outlet obstruction with possible superimposed cystitis. Recommend correlation with urinalysis. 4. Keyhole defect in the prostate may reflect prior prostate resection. Heterogeneous appearance of the prostate with adjacent stranding, could reflect concomitant prostatitis. 5. Hyperdense left renal pyramids , a nonspecific finding but can be seen urinary obstruction, metabolic derangement or papillary necrosis among other etiologies. 6. Indeterminate heterogeneous lesion in the inferior right lobe liver measuring approximately 3.8 x 4.8 x 5.2 cm, incompletely characterized on noncontrast CT. Recommend further evaluation with nonemergent liver protocol CT or MRI. 7. Cholelithiasis without evidence of acute cholecystitis. 8. Severe dextrocurvature of the spine across fused L1-L4 levels. Spondylolysis and spondylolisthesis as described above. Severe bilateral foraminal narrowing L5-S1. Moderate canal narrowing L3-4, L4-5. 9. Aortic Atherosclerosis (ICD10-I70.0). These results were called by telephone at the time of interpretation on 07/08/2020 at 8:26 pm to provider DAVID YAO , who verbally acknowledged these results. Electronically Signed   By: Lovena Le M.D.   On: 07/08/2020 20:22   DG Chest 2 View  Result Date: 07/08/2020 CLINICAL DATA:  Cough for 2 weeks. EXAM: CHEST - 2 VIEW COMPARISON:  Single-view of the chest 10/11/2018. FINDINGS: The lungs are clear. Heart size is normal. Aortic atherosclerosis. No pneumothorax or pleural effusion. Cervical fusion hardware is unchanged. Inferior most screw on the left has partially backed out. Spinal stimulator noted. IMPRESSION: No acute disease. Aortic Atherosclerosis (ICD10-I70.0). Electronically Signed   By: Inge Rise M.D.   On: 07/08/2020 14:57   US Scrotum  Result Date: 07/08/2020 CLINICAL DATA:  86 year old with scrotal swelling. Rule out scrotal abscess. EXAM: ULTRASOUND OF SCROTUM TECHNIQUE: Complete  ultrasound examination of the testicles, epididymis, and other scrotal structures was performed. COMPARISON:  Included portions from abdominopelvic CT earlier today. FINDINGS: Right testicle Measurements: 3.1 x 0.9 x 2.2 cm. No mass or microlithiasis visualized. Blood flow is noted. No intratesticular collection. There are extra testicular foci in the scrotum with posterior shadowing measuring up to 6 mm. No dirty shadowing to suggest air. Left testicle Measurements: 3.5 x 2.1 x 2.3 cm. No mass or microlithiasis visualized. Blood flow is noted. No intratesticular collection. There are extratesticular foci in the scrotum with posterior shadowing measuring up to 8 mm. No dirty shadowing to suggest air. Right epididymis:  Normal in size and appearance. Left epididymis:  Normal in size and appearance. Hydrocele:  Small on the right.  Fluid appears simple. Varicocele:  None visualized. Other: Diffuse bilateral skin and soft tissue thickening and edema measuring up to 2 cm. No discrete scrotal fluid collection. IMPRESSION: 1. Diffuse scrotal skin and soft tissue thickening measuring up to 2 cm typical cellulitis. No discrete scrotal fluid collection. 2. Small right hydrocele. 3. Extra testicular foci with posterior shadowing measuring up to 6 mm on the right and 8 mm on the left. These are most typical of calcifications and likely postinflammatory. 4. Please note the area of phlegmonous change in the perineum on CT was not evaluated on this scrotal ultrasound. Electronically Signed   By: Keith Rake M.D.   On: 07/08/2020 20:43     A/P: Wayne [name] is an 86 y.o. male with underlying dementia on anticoagulation, CHF, HTN, HLD, uretheral  strictures - would query whether he could have underlying urethral injury as the source for all of this  -I have seen and examined him alongside Dr. Claudia Desanctis. We spent time discussing plans moving forward and going over everything with his son at length -Will need to have  significant scrotal debridement; probable incision/draining of perineum concurrently.  -Admit for broad spec IV abx, additional IVF. Given purulent urine, hydronephrosis, AKI - will allow resuscitation to catch up -We will follow with you -Anticipate OR tomorrow for joint case with urology - our portion will likely involve incision/draining of perineum, possible debridement but nothing currently looks devitalized in perineum. Likely placement of penrose drains. All of this has been discussed with the son Rashied Corallo. We discussed expectations. We reviewed the procedure, material risks (including but not limited to pain, bleeding, need for additional procedures, damage to surrounding structures, heart attack, stroke, death) benefits and alternatives including comfort measures. He has opted to proceed with surgery.  Reviewed this would be with one of my partners - likely Dr. Donne Hazel -Hold Eliquis if able; if necessary, can use heparin gtt  Sharon Mt. Dema Severin, M.D. [facility] Use [email or web address] to contact on call provider

## 2020-07-08 NOTE — Progress Notes (Signed)
Pharmacy Antibiotic Note  Wayne [name] is a 86 y.o. male admitted on 07/08/2020 with cellulitis.  Pharmacy has been consulted for cefepime dosing.  Patient is afebrile, WBC 13.9. Lactic acid 2.0. SCr elevated ~3.2 (unsure BL SCr, 1.7?), CrCl ~11.   Plan:  Cefepime 2g IV every 24 hours Follow up with cultures, antibiotic de-escalation and LOT Monitor renal function and clinical progress Follow up with vancomycin dosing (one dose ordered by MD)  Weight: 54.4 kg (120 lb)  Temp (24hrs), Avg:98.1 F (36.7 C), Min:98.1 F (36.7 C), Max:98.1 F (36.7 C)  Recent Labs  Lab 07/08/20 1519 07/08/20 1528  WBC  --  13.9*  CREATININE  --  3.23*  LATICACIDVEN 2.0*  --     CrCl cannot be calculated (Unknown ideal weight.).    Allergies  Allergen Reactions  . Doxazosin Mesylate Other (See Comments)    Makes blood pressure  . Propoxyphene Hcl Other (See Comments)    REACTION: upset stomach  . Penicillins Rash    DID THE REACTION INVOLVE: Swelling of the face/tongue/throat, SOB, or low BP? No Sudden or severe rash/hives, skin peeling, or the inside of the mouth or nose? Yes Did it require medical treatment? Yes When did it last happen?50 yrs ago If all above answers are "NO", may proceed with cephalosporin use.     Antimicrobials this admission: Cefepime 9/27 >> Vancomycin x1  Dose adjustments this admission: N/a  Microbiology results: 9/27 BCx: pending  Thank you for allowing pharmacy to be a part of this patient's care.  Mercy Riding, PharmD PGY1 Acute Care Pharmacy Resident Please refer to [facility] for unit-specific pharmacist

## 2020-07-08 NOTE — ED Provider Notes (Signed)
[facility] EMERGENCY DEPARTMENT Provider Note   CSN: [id number] Arrival date & time: 07/08/20  1405     History Chief Complaint  Patient presents with  . Testicle Pain    Wayne [name] is a 86 y.o. male hx of dementia, BPH with stricture require dilation, here presenting with scrotal swelling.  Patient is from [facility] home.  Nursing home noticed scrotal swelling and edema 5 days ago.  Patient apparently had a subcutaneous line placed at the nursing home and supposedly getting some antibiotics.  Upon review of his nursing home records, I do not see documentation of which antibiotics was received.  Patient apparently has worsening scrotal swelling so was sent here for further evaluation.  Patient is demented and unable to give much history.  Patient apparently had urethral strictures before requires dilation.  The history is provided by the patient.       Past Medical History:  Diagnosis Date  . Anxiety    takes Atrivan daily  . Back pain    chronic with neck pain  . Cancer ([place])    skin  . CHF (congestive heart failure) ([place])   . Constipation    related to medication  . Dementia ([place])    takes Aricept nightly  . Enlarged prostate    self caths once every 1-2wks  . GERD (gastroesophageal reflux disease)    doesn't require medication  . Hx of seasonal allergies   . Hyperlipidemia    takes Simvastatin daily  . Hypertension    takes atenolol daily  . Hypothyroidism    takes Synthroid daily  . Insomnia    related to pain  . MRSA (methicillin resistant staph aureus) culture positive    Per patient tested on 10/07/11.  . Osteoporosis   . Peripheral edema    takes Torsemide daily  . Scoliosis     Patient Active Problem List   Diagnosis Date Noted  . Hip fracture, unspecified laterality, closed, initial encounter ([place]) 10/11/2018  . Hip fracture ([place]) 10/11/2018  . Closed intertrochanteric fracture of left femur ([place]) 10/11/2018  . Viral URI  with cough 10/20/2017  . Pneumonia of left lower lobe due to infectious organism 10/20/2017  . Renal insufficiency 01/25/2012  . Insomnia 12/21/2011  . Venous insufficiency 08/24/2011  . ALLERGIC RHINITIS 12/10/2008  . DERMATITIS, CONTACT, NOS 08/11/2007  . Short-term memory loss 08/02/2007  . Hypothyroidism 04/07/2007  . Hyperlipidemia 04/07/2007  . Essential hypertension 04/07/2007    Past Surgical History:  Procedure Laterality Date  . abdominal cyst     removed-2012;MRSA done by Dr.Gross  . CATARACT EXTRACTION    . cataracts     bilateral  . CERVICAL DISC SURGERY     x1  . COLONOSCOPY    . ESOPHAGOGASTRODUODENOSCOPY    . HERNIA REPAIR     Right inguinal  . INTRAMEDULLARY (IM) NAIL INTERTROCHANTERIC Left 10/12/2018  . INTRAMEDULLARY (IM) NAIL INTERTROCHANTERIC Left 10/12/2018   Procedure: INTRAMEDULLARY (IM) NAIL INTERTROCHANTRIC;  Surgeon: Renette Butters, MD;  Location: [facility];  Service: Orthopedics;  Laterality: Left;  . LUMBAR DISC SURGERY     x2  . TOE SURGERY     right great toe  . TRANSURETHRAL RESECTION OF PROSTATE     15+yrs ago       Family History  Problem Relation Age of Onset  . Heart disease Mother 41       Vague history  . Stroke Father   . Hypertension Other  Multiple family members both sides  . Diabetes Other   . Anesthesia problems Neg Hx   . Hypotension Neg Hx   . Malignant hyperthermia Neg Hx   . Pseudochol deficiency Neg Hx     Social History   Tobacco Use  . Smoking status: Never Smoker  . Smokeless tobacco: Never Used  Vaping Use  . Vaping Use: Never used  Substance Use Topics  . Alcohol use: No  . Drug use: No    Home Medications Prior to Admission medications   Medication Sig Start Date End Date Taking? Authorizing Provider  aspirin EC 81 MG tablet Take 81 mg by mouth daily.    [provider]  atenolol (TENORMIN) 50 MG tablet TAKE 1 TABLET BY MOUTH EVERY DAY 07/11/19   Isaac Bliss, Rayford Halsted, MD   donepezil (ARICEPT) 5 MG tablet TAKE 1 TABLET BY MOUTH EVERYDAY AT BEDTIME 07/11/19   Isaac Bliss, Rayford Halsted, MD  levothyroxine (SYNTHROID) 50 MCG tablet TAKE 1 TABLET BY MOUTH DAILY BEFORE BREAKFAST 09/27/19   Isaac Bliss, Rayford Halsted, MD  LORazepam (ATIVAN) 0.5 MG tablet Take 1 tablet (0.5 mg total) by mouth at bedtime as needed for sleep. 02/09/19   Isaac Bliss, Rayford Halsted, MD  polyethylene glycol [facility] [place] / Floria Raveling) packet Take 17 g by mouth daily as needed for mild constipation. 10/15/18   Shelly Coss, MD  senna-docusate (SENOKOT-S) 8.6-50 MG tablet Take 1 tablet by mouth at bedtime as needed for mild constipation. 10/15/18   Shelly Coss, MD  simvastatin (ZOCOR) 40 MG tablet TAKE 1 TABLET BY MOUTH EVERY DAY AT NIGHT 06/08/19   Isaac Bliss, Rayford Halsted, MD  torsemide (DEMADEX) 20 MG tablet TAKE 1 TABLET BY MOUTH EVERY DAY 07/25/19   Isaac Bliss, Rayford Halsted, MD    Allergies    Doxazosin mesylate, Propoxyphene hcl, and Penicillins  Review of Systems   Review of Systems  Genitourinary: Positive for testicular pain.  All other systems reviewed and are negative.   Physical Exam Updated Vital Signs BP 103/77 (BP Location: Left Arm)   Pulse 95   Temp 98.1 F (36.7 C) (Oral)   Resp 16   Ht 5' 7" (1.702 m)   Wt 51.7 kg   SpO2 99%   BMI 17.85 kg/m   Physical Exam Vitals and nursing note reviewed.  Constitutional:      Comments: Demented, uncomfortable   HENT:     Head: Normocephalic.     Nose: Nose normal.     Mouth/Throat:     Mouth: Mucous membranes are dry.  Eyes:     Extraocular Movements: Extraocular movements intact.     Pupils: Pupils are equal, round, and reactive to light.  Cardiovascular:     Rate and Rhythm: Normal rate and regular rhythm.     Pulses: Normal pulses.     Heart sounds: Normal heart sounds.  Pulmonary:     Effort: Pulmonary effort is normal.     Breath sounds: Normal breath sounds.  Abdominal:     Comments: Mild lower abdominal  tenderness. ? Cellulitis lower abdomen   Genitourinary:    Comments: Scrotum and red.  Unable to palpate the testicles.  On the posterior aspect of the scrotum, he had to turn white with some discharge.  Musculoskeletal:        General: Normal range of motion.     Cervical back: Normal range of motion.     Comments: R inguinal subcutaneous line in place   Skin:  General: Skin is warm.     Capillary Refill: Capillary refill takes less than 2 seconds.  Neurological:     Comments: Demented, moving all extremities   Psychiatric:        Mood and Affect: Mood normal.        ED Results / Procedures / Treatments   Labs (all labs ordered are listed, but only abnormal results are displayed) Labs Reviewed  COMPREHENSIVE METABOLIC PANEL - Abnormal; Notable for the following components:      Result Value   Sodium 134 (*)    Potassium 5.2 (*)    CO2 16 (*)    Glucose, Bld 114 (*)    BUN 126 (*)    Creatinine, Ser 3.23 (*)    Albumin 2.0 (*)    AST 44 (*)    ALT 45 (*)    Alkaline Phosphatase 213 (*)    GFR calc non Af Amer 16 (*)    GFR calc Af Amer 18 (*)    All other components within normal limits  LACTIC ACID, PLASMA - Abnormal; Notable for the following components:   Lactic Acid, Venous 2.0 (*)    All other components within normal limits  CBC WITH DIFFERENTIAL/PLATELET - Abnormal; Notable for the following components:   WBC 13.9 (*)    RBC 3.71 (*)    Hemoglobin 10.6 (*)    HCT 33.3 (*)    Neutro Abs 12.6 (*)    All other components within normal limits  PROTIME-INR - Abnormal; Notable for the following components:   Prothrombin Time 21.0 (*)    INR 1.9 (*)    All other components within normal limits  CULTURE, BLOOD (ROUTINE X 2)  CULTURE, BLOOD (ROUTINE X 2)  LACTIC ACID, PLASMA  URINALYSIS, ROUTINE W REFLEX MICROSCOPIC  BASIC METABOLIC PANEL    EKG None  Radiology DG Chest 2 View  Result Date: 07/08/2020 CLINICAL DATA:  Cough for 2 weeks. EXAM: CHEST - 2  VIEW COMPARISON:  Single-view of the chest 10/11/2018. FINDINGS: The lungs are clear. Heart size is normal. Aortic atherosclerosis. No pneumothorax or pleural effusion. Cervical fusion hardware is unchanged. Inferior most screw on the left has partially backed out. Spinal stimulator noted. IMPRESSION: No acute disease. Aortic Atherosclerosis (ICD10-I70.0). Electronically Signed   By: Inge Rise M.D.   On: 07/08/2020 14:57    Procedures BLADDER CATHETERIZATION  Date/Time: 07/08/2020 8:31 PM Performed by: Drenda Freeze, MD Authorized by: Drenda Freeze, MD   Consent:    Consent obtained:  Verbal   Consent given by:  Guardian   Risks discussed:  False passage and urethral injury   Alternatives discussed:  No treatment Pre-procedure details:    Procedure purpose:  Diagnostic   Preparation: Patient was prepped and draped in usual sterile fashion   Anesthesia (see MAR for exact dosages):    Anesthesia method:  None Procedure details:    Provider performed due to:  Complicated insertion and nurse unable to complete   Catheter insertion:  Indwelling   Catheter type:  Coude   Catheter size:  16 Fr   Bladder irrigation: no     Number of attempts:  4   Urine characteristics:  Bloody Comments:     I attempted I attempted multiple coud catheters- 16, 18, 20 F as well as 14 F catheter but met resistance.    (including critical care time)  CRITICAL CARE Performed by: Wandra Arthurs   Total critical care time: 30 minutes  Critical care time was exclusive of separately billable procedures and treating other patients.  Critical care was necessary to treat or prevent imminent or life-threatening deterioration.  Critical care was time spent personally by me on the following activities: development of treatment plan with patient and/or surrogate as well as nursing, discussions with consultants, evaluation of patient's response to treatment, examination of patient, obtaining history  from patient or surrogate, ordering and performing treatments and interventions, ordering and review of laboratory studies, ordering and review of radiographic studies, pulse oximetry and re-evaluation of patient's condition.   Medications Ordered in ED Medications  vancomycin (VANCOCIN) IVPB 1000 mg/200 mL premix (has no administration in time range)  ceFEPIme (MAXIPIME) 2 g in sodium chloride 0.9 % 100 mL IVPB (2 g Intravenous New Bag/Given 07/08/20 1939)  ceFEPIme (MAXIPIME) 2 g in sodium chloride 0.9 % 100 mL IVPB (has no administration in time range)  morphine 4 MG/ML injection 4 mg (4 mg Intravenous Given 07/08/20 1933)  sodium chloride 0.9 % bolus 1,000 mL (1,000 mLs Intravenous New Bag/Given 07/08/20 1932)    ED Course  I have reviewed the triage vital signs and the nursing notes.  Pertinent labs & imaging results that were available during my care of the patient were reviewed by me and considered in my medical decision making (see chart for details).    MDM Rules/Calculators/A&P                         Wayne [name] is a 86 y.o. male here presenting with scrotal swelling and edema.  Has been going on for the last 5 days.  I am concerned for possible Fournier's gangrene versus scrotal cellulitis.  Plan to get ultrasound and CT to further assess as well as getting CBC, CMP, UA, blood cultures.  Will give broad-spectrum antibiotics.  8 pm Patient has acute renal failure with creatinine of 3.2.  I attempted to place Foley but was unsuccessful.  Consulted urology to see patient.  Imaging still pending  9:33 PM US scrotum showed no obvious fluid collection. CT showed perirectal, gluteal abscess extending to the base of the penis. Urology will place foley, consulted Dr. Dema Severin from surgery, who saw patient. Patient is on eliquis so he recommend IV abx and likely surgery in AM. Hospitalist to admit.   Final Clinical Impression(s) / ED Diagnoses Final diagnoses:  None    Rx / DC  Orders ED Discharge Orders    None       Drenda Freeze, MD 07/08/20 2134

## 2020-07-09 ENCOUNTER — Inpatient Hospital Stay (HOSPITAL_COMMUNITY): Payer: Medicare Other | Admitting: Anesthesiology

## 2020-07-09 ENCOUNTER — Encounter (HOSPITAL_COMMUNITY): Payer: Self-pay | Admitting: Internal Medicine

## 2020-07-09 ENCOUNTER — Encounter (HOSPITAL_COMMUNITY)
Admission: EM | Disposition: A | Discharge status: Skilled Nursing Facility | Payer: Self-pay | Source: Home / Self Care | Attending: Internal Medicine

## 2020-07-09 DIAGNOSIS — I10 Essential (primary) hypertension: Secondary | ICD-10-CM

## 2020-07-09 DIAGNOSIS — N390 Urinary tract infection, site not specified: Secondary | ICD-10-CM | POA: Diagnosis not present

## 2020-07-09 DIAGNOSIS — N1330 Unspecified hydronephrosis: Secondary | ICD-10-CM | POA: Diagnosis not present

## 2020-07-09 DIAGNOSIS — Z20822 Contact with and (suspected) exposure to covid-19: Secondary | ICD-10-CM | POA: Diagnosis not present

## 2020-07-09 DIAGNOSIS — F0390 Unspecified dementia without behavioral disturbance: Secondary | ICD-10-CM

## 2020-07-09 DIAGNOSIS — Z7189 Other specified counseling: Secondary | ICD-10-CM | POA: Diagnosis not present

## 2020-07-09 DIAGNOSIS — N179 Acute kidney failure, unspecified: Secondary | ICD-10-CM | POA: Diagnosis not present

## 2020-07-09 DIAGNOSIS — M726 Necrotizing fasciitis: Secondary | ICD-10-CM | POA: Diagnosis not present

## 2020-07-09 DIAGNOSIS — N492 Inflammatory disorders of scrotum: Secondary | ICD-10-CM

## 2020-07-09 DIAGNOSIS — N139 Obstructive and reflux uropathy, unspecified: Secondary | ICD-10-CM

## 2020-07-09 DIAGNOSIS — E875 Hyperkalemia: Secondary | ICD-10-CM

## 2020-07-09 DIAGNOSIS — Z515 Encounter for palliative care: Secondary | ICD-10-CM

## 2020-07-09 DIAGNOSIS — R6521 Severe sepsis with septic shock: Secondary | ICD-10-CM | POA: Diagnosis not present

## 2020-07-09 DIAGNOSIS — L8990 Pressure ulcer of unspecified site, unspecified stage: Secondary | ICD-10-CM | POA: Insufficient documentation

## 2020-07-09 DIAGNOSIS — A419 Sepsis, unspecified organism: Secondary | ICD-10-CM | POA: Diagnosis not present

## 2020-07-09 HISTORY — PX: INCISION AND DRAINAGE OF WOUND: SHX1803

## 2020-07-09 LAB — CBC WITH DIFFERENTIAL/PLATELET
Abs Immature Granulocytes: 0.1 10*3/uL — ABNORMAL HIGH (ref 0.00–0.07)
Basophils Absolute: 0 10*3/uL (ref 0.0–0.1)
Basophils Relative: 0 %
Eosinophils Absolute: 0 10*3/uL (ref 0.0–0.5)
Eosinophils Relative: 0 %
HCT: 27.6 % — ABNORMAL LOW (ref 39.0–52.0)
Hemoglobin: 8.7 g/dL — ABNORMAL LOW (ref 13.0–17.0)
Immature Granulocytes: 1 %
Lymphocytes Relative: 11 %
Lymphs Abs: 1.4 10*3/uL (ref 0.7–4.0)
MCH: 28.4 pg (ref 26.0–34.0)
MCHC: 31.5 g/dL (ref 30.0–36.0)
MCV: 90.2 fL (ref 80.0–100.0)
Monocytes Absolute: 0.9 10*3/uL (ref 0.1–1.0)
Monocytes Relative: 7 %
Neutro Abs: 9.6 10*3/uL — ABNORMAL HIGH (ref 1.7–7.7)
Neutrophils Relative %: 81 %
Platelets: 281 10*3/uL (ref 150–400)
RBC: 3.06 MIL/uL — ABNORMAL LOW (ref 4.22–5.81)
RDW: 14.6 % (ref 11.5–15.5)
WBC: 12 10*3/uL — ABNORMAL HIGH (ref 4.0–10.5)
nRBC: 0 % (ref 0.0–0.2)

## 2020-07-09 LAB — PROTIME-INR
INR: 1.9 — ABNORMAL HIGH (ref 0.8–1.2)
Prothrombin Time: 21.3 seconds — ABNORMAL HIGH (ref 11.4–15.2)

## 2020-07-09 LAB — HEPATIC FUNCTION PANEL
ALT: 33 U/L (ref 0–44)
AST: 23 U/L (ref 15–41)
Albumin: 1.7 g/dL — ABNORMAL LOW (ref 3.5–5.0)
Alkaline Phosphatase: 155 U/L — ABNORMAL HIGH (ref 38–126)
Bilirubin, Direct: 0.1 mg/dL (ref 0.0–0.2)
Indirect Bilirubin: 0.6 mg/dL (ref 0.3–0.9)
Total Bilirubin: 0.7 mg/dL (ref 0.3–1.2)
Total Protein: 5.8 g/dL — ABNORMAL LOW (ref 6.5–8.1)

## 2020-07-09 LAB — GLUCOSE, CAPILLARY
Glucose-Capillary: 118 mg/dL — ABNORMAL HIGH (ref 70–99)
Glucose-Capillary: 68 mg/dL — ABNORMAL LOW (ref 70–99)
Glucose-Capillary: 93 mg/dL (ref 70–99)
Glucose-Capillary: 80 mg/dL (ref 70–99)

## 2020-07-09 LAB — APTT: aPTT: 31 seconds (ref 24–36)

## 2020-07-09 LAB — TYPE AND SCREEN
ABO/RH(D): A POS
Antibody Screen: NEGATIVE

## 2020-07-09 LAB — HEMOGLOBIN A1C
Hgb A1c MFr Bld: 5.2 % (ref 4.8–5.6)
Mean Plasma Glucose: 102.54 mg/dL

## 2020-07-09 LAB — RESPIRATORY PANEL BY RT PCR (FLU A&B, COVID)
Influenza A by PCR: NEGATIVE
Influenza B by PCR: NEGATIVE
SARS Coronavirus 2 by RT PCR: NEGATIVE

## 2020-07-09 SURGERY — IRRIGATION AND DEBRIDEMENT WOUND
Anesthesia: General | Site: Perineum

## 2020-07-09 MED ORDER — FENTANYL CITRATE (PF) 250 MCG/5ML IJ SOLN
INTRAMUSCULAR | Status: AC
Start: 2020-07-09 — End: ?
  Filled 2020-07-09: qty 5

## 2020-07-09 MED ORDER — INSULIN ASPART 100 UNIT/ML ~~LOC~~ SOLN: 0.0000 [IU] | SUBCUTANEOUS | Status: DC

## 2020-07-09 MED ORDER — MEPERIDINE HCL 25 MG/ML IJ SOLN: 6.2500 mg | INTRAMUSCULAR | Status: DC | PRN

## 2020-07-09 MED ORDER — DEXTROSE 50 % IV SOLN
INTRAVENOUS | Status: AC
  Filled 2020-07-09: qty 50

## 2020-07-09 MED ORDER — PHENYLEPHRINE HCL-NACL 10-0.9 MG/250ML-% IV SOLN
INTRAVENOUS | Status: DC | PRN
  Administered 2020-07-09: 50 ug/min via INTRAVENOUS

## 2020-07-09 MED ORDER — LACTATED RINGERS IV SOLN: INTRAVENOUS | Status: DC

## 2020-07-09 MED ORDER — OXYCODONE HCL 5 MG PO TABS: 5.0000 mg | ORAL_TABLET | Freq: Once | ORAL | Status: DC | PRN

## 2020-07-09 MED ORDER — PHENYLEPHRINE HCL-NACL 10-0.9 MG/250ML-% IV SOLN: 0.0000 ug/min | INTRAVENOUS | Status: DC

## 2020-07-09 MED ORDER — METRONIDAZOLE IN NACL 5-0.79 MG/ML-% IV SOLN
500.0000 mg | Freq: Three times a day (TID) | INTRAVENOUS | Status: DC
  Administered 2020-07-09 – 2020-07-12 (×9): 500 mg via INTRAVENOUS
  Filled 2020-07-09 (×10): qty 100

## 2020-07-09 MED ORDER — LIDOCAINE 2% (20 MG/ML) 5 ML SYRINGE
INTRAMUSCULAR | Status: DC | PRN
  Administered 2020-07-09: 40 mg via INTRAVENOUS

## 2020-07-09 MED ORDER — PHENYLEPHRINE 40 MCG/ML (10ML) SYRINGE FOR IV PUSH (FOR BLOOD PRESSURE SUPPORT)
PREFILLED_SYRINGE | INTRAVENOUS | Status: DC | PRN
  Administered 2020-07-09 (×2): 80 ug via INTRAVENOUS
  Administered 2020-07-09: 120 ug via INTRAVENOUS
  Administered 2020-07-09 (×2): 80 ug via INTRAVENOUS

## 2020-07-09 MED ORDER — LEVOTHYROXINE SODIUM 50 MCG PO TABS
50.0000 ug | ORAL_TABLET | Freq: Every day | ORAL | Status: DC
  Administered 2020-07-10 – 2020-07-19 (×9): 50 ug via ORAL
  Filled 2020-07-09 (×3): qty 1
  Filled 2020-07-09: qty 2
  Filled 2020-07-09 (×7): qty 1

## 2020-07-09 MED ORDER — MAGIC MOUTHWASH
5.0000 mL | Freq: Two times a day (BID) | ORAL | Status: DC
  Administered 2020-07-09 – 2020-07-19 (×16): 5 mL via ORAL
  Filled 2020-07-09 (×22): qty 5

## 2020-07-09 MED ORDER — FENTANYL CITRATE (PF) 100 MCG/2ML IJ SOLN
INTRAMUSCULAR | Status: AC
  Filled 2020-07-09: qty 2

## 2020-07-09 MED ORDER — ONDANSETRON HCL 4 MG/2ML IJ SOLN: 4.0000 mg | Freq: Once | INTRAMUSCULAR | Status: DC | PRN

## 2020-07-09 MED ORDER — ONDANSETRON HCL 4 MG/2ML IJ SOLN
INTRAMUSCULAR | Status: DC | PRN
  Administered 2020-07-09: 4 mg via INTRAVENOUS

## 2020-07-09 MED ORDER — POLYETHYLENE GLYCOL 3350 17 G PO PACK: 17.0000 g | PACK | Freq: Every day | ORAL | Status: DC | PRN

## 2020-07-09 MED ORDER — SODIUM CHLORIDE 0.9 % IV SOLN: INTRAVENOUS | Status: DC | PRN

## 2020-07-09 MED ORDER — ALBUMIN HUMAN 5 % IV SOLN
INTRAVENOUS | Status: AC
  Filled 2020-07-09: qty 250

## 2020-07-09 MED ORDER — PROTHROMBIN COMPLEX CONC HUMAN 500 UNITS IV KIT
2088.0000 [IU] | PACK | Status: AC
  Administered 2020-07-09: 2088 [IU] via INTRAVENOUS
  Filled 2020-07-09: qty 2088

## 2020-07-09 MED ORDER — SENNOSIDES-DOCUSATE SODIUM 8.6-50 MG PO TABS
1.0000 | ORAL_TABLET | Freq: Every evening | ORAL | Status: DC | PRN
  Administered 2020-07-11 – 2020-07-18 (×3): 1 via ORAL
  Filled 2020-07-09 (×3): qty 1

## 2020-07-09 MED ORDER — CHLORHEXIDINE GLUCONATE 0.12 % MT SOLN
OROMUCOSAL | Status: AC
  Filled 2020-07-09: qty 15

## 2020-07-09 MED ORDER — 0.9 % SODIUM CHLORIDE (POUR BTL) OPTIME
TOPICAL | Status: DC | PRN
  Administered 2020-07-09: 1000 mL

## 2020-07-09 MED ORDER — ACETAMINOPHEN 160 MG/5ML PO SOLN: 325.0000 mg | ORAL | Status: DC | PRN

## 2020-07-09 MED ORDER — ALBUMIN HUMAN 5 % IV SOLN
12.5000 g | Freq: Once | INTRAVENOUS | Status: AC
  Administered 2020-07-09: 12.5 g via INTRAVENOUS
  Filled 2020-07-09: qty 250

## 2020-07-09 MED ORDER — OXYCODONE HCL 5 MG/5ML PO SOLN: 5.0000 mg | Freq: Once | ORAL | Status: DC | PRN

## 2020-07-09 MED ORDER — TRAMADOL HCL 50 MG PO TABS
50.0000 mg | ORAL_TABLET | Freq: Three times a day (TID) | ORAL | Status: DC | PRN
  Administered 2020-07-09 – 2020-07-12 (×3): 50 mg via ORAL
  Filled 2020-07-09 (×5): qty 1

## 2020-07-09 MED ORDER — ALBUMIN HUMAN 25 % IV SOLN
25.0000 g | Freq: Four times a day (QID) | INTRAVENOUS | Status: AC
  Administered 2020-07-09 – 2020-07-10 (×4): 25 g via INTRAVENOUS
  Filled 2020-07-09 (×5): qty 100

## 2020-07-09 MED ORDER — FENTANYL CITRATE (PF) 100 MCG/2ML IJ SOLN
25.0000 ug | INTRAMUSCULAR | Status: DC | PRN
  Administered 2020-07-09: 25 ug via INTRAVENOUS

## 2020-07-09 MED ORDER — ZINC OXIDE 40 % EX OINT
TOPICAL_OINTMENT | Freq: Three times a day (TID) | CUTANEOUS | Status: DC
  Administered 2020-07-09: 1 via TOPICAL
  Filled 2020-07-09 (×2): qty 57

## 2020-07-09 MED ORDER — MIRTAZAPINE 15 MG PO TABS
15.0000 mg | ORAL_TABLET | Freq: Every day | ORAL | Status: DC
  Administered 2020-07-09 – 2020-07-18 (×9): 15 mg via ORAL
  Filled 2020-07-09 (×11): qty 1

## 2020-07-09 MED ORDER — BUPIVACAINE HCL (PF) 0.25 % IJ SOLN
INTRAMUSCULAR | Status: AC
  Filled 2020-07-09: qty 30

## 2020-07-09 MED ORDER — PROPOFOL 10 MG/ML IV BOLUS
INTRAVENOUS | Status: DC | PRN
  Administered 2020-07-09: 80 mg via INTRAVENOUS
  Administered 2020-07-09 (×2): 20 mg via INTRAVENOUS

## 2020-07-09 MED ORDER — DEXTROSE 50 % IV SOLN
12.5000 g | INTRAVENOUS | Status: AC
  Administered 2020-07-09: 12.5 g via INTRAVENOUS

## 2020-07-09 MED ORDER — FENTANYL CITRATE (PF) 250 MCG/5ML IJ SOLN
INTRAMUSCULAR | Status: DC | PRN
Start: 2020-07-09 — End: 2020-07-09
  Administered 2020-07-09 (×3): 25 ug via INTRAVENOUS

## 2020-07-09 MED ORDER — SERTRALINE HCL 50 MG PO TABS
75.0000 mg | ORAL_TABLET | Freq: Every day | ORAL | Status: DC
  Administered 2020-07-11 – 2020-07-19 (×8): 75 mg via ORAL
  Filled 2020-07-09 (×6): qty 2
  Filled 2020-07-09: qty 1
  Filled 2020-07-09 (×3): qty 2

## 2020-07-09 MED ORDER — CHLORHEXIDINE GLUCONATE CLOTH 2 % EX PADS
6.0000 | MEDICATED_PAD | Freq: Every day | CUTANEOUS | Status: DC
  Administered 2020-07-09 – 2020-07-19 (×12): 6 via TOPICAL

## 2020-07-09 MED ORDER — DERMACLOUD EX CREA: 1.0000 "application " | TOPICAL_CREAM | Freq: Three times a day (TID) | CUTANEOUS | Status: DC

## 2020-07-09 MED ORDER — ACETAMINOPHEN 325 MG PO TABS: 325.0000 mg | ORAL_TABLET | ORAL | Status: DC | PRN

## 2020-07-09 MED ORDER — ALBUMIN HUMAN 25 % IV SOLN
25.0000 g | Freq: Once | INTRAVENOUS | Status: AC
  Administered 2020-07-09: 25 g via INTRAVENOUS
  Filled 2020-07-09: qty 100

## 2020-07-09 MED ORDER — SODIUM CHLORIDE 0.9 % IV BOLUS
500.0000 mL | Freq: Once | INTRAVENOUS | Status: AC
  Administered 2020-07-09: 500 mL via INTRAVENOUS

## 2020-07-09 SURGICAL SUPPLY — 26 items
BNDG GAUZE ELAST 4 BULKY (GAUZE/BANDAGES/DRESSINGS) ×2 IMPLANT
CANISTER SUCT 3000ML PPV (MISCELLANEOUS) ×3 IMPLANT
COVER SURGICAL LIGHT HANDLE (MISCELLANEOUS) ×3 IMPLANT
DRAPE IMP U-DRAPE 54X76 (DRAPES) IMPLANT
DRAPE LAPAROSCOPIC ABDOMINAL (DRAPES) IMPLANT
DRAPE LAPAROTOMY 100X72 PEDS (DRAPES) IMPLANT
DRSG PAD ABDOMINAL 8X10 ST (GAUZE/BANDAGES/DRESSINGS) ×2 IMPLANT
ELECT CAUTERY BLADE 6.4 (BLADE) ×3 IMPLANT
ELECT REM PT RETURN 9FT ADLT (ELECTROSURGICAL) ×3
ELECTRODE REM PT RTRN 9FT ADLT (ELECTROSURGICAL) ×1 IMPLANT
GAUZE SPONGE 4X4 12PLY STRL (GAUZE/BANDAGES/DRESSINGS) ×2 IMPLANT
GLOVE BIO SURGEON STRL SZ7 (GLOVE) ×3 IMPLANT
GLOVE BIOGEL PI IND STRL 7.5 (GLOVE) ×1 IMPLANT
GLOVE BIOGEL PI INDICATOR 7.5 (GLOVE) ×2
GOWN STRL REUS W/ TWL LRG LVL3 (GOWN DISPOSABLE) ×2 IMPLANT
GOWN STRL REUS W/TWL LRG LVL3 (GOWN DISPOSABLE) ×6
KIT BASIN OR (CUSTOM PROCEDURE TRAY) ×3 IMPLANT
KIT TURNOVER KIT B (KITS) ×3 IMPLANT
NS IRRIG 1000ML POUR BTL (IV SOLUTION) ×3 IMPLANT
PACK GENERAL/GYN (CUSTOM PROCEDURE TRAY) ×1 IMPLANT
PACK LITHOTOMY IV (CUSTOM PROCEDURE TRAY) ×2 IMPLANT
PAD ARMBOARD 7.5X6 YLW CONV (MISCELLANEOUS) ×3 IMPLANT
PENCIL SMOKE EVACUATOR (MISCELLANEOUS) ×3 IMPLANT
SPONGE LAP 18X18 RF (DISPOSABLE) ×2 IMPLANT
TOWEL GREEN STERILE (TOWEL DISPOSABLE) ×3 IMPLANT
TOWEL GREEN STERILE FF (TOWEL DISPOSABLE) ×3 IMPLANT

## 2020-07-09 NOTE — Op Note (Addendum)
Operative Note  Preoperative diagnosis:  1.  Necrotizing soft tissue infection of scrotum and perineum  Postoperative diagnosis: 1.  Same  Procedure(s): 1.  Exploration, washout and debridement of necrotic scrotum  Surgeon: Jacalyn Lefevre, MD  Assistants:  Done in conjunction with general surgery  Anesthesia:  General  Complications:  None  EBL:  minimal  Specimens: 1. Wound culture  Drains/Catheters: 1.  18Fr council tip foley  Intraoperative findings:   1. Necrotic anterior/inferior scrotal skin (10 x 6 cm) 2. Bilateral descended testis appear healthy and perfused  Indication:  Wayne [name] is a 86 y.o. male with history of known urethral stricture who underwent bedside urethral balloon dilation last night.  Patient also with scrotal and perineal abscess requiring exploration, washout and debridement.  Description of procedure: After the risks and benefits were discussed with the patient's son he was taken to the operating room and anesthesia was induced.  He was prepped and draped in the usual sterile fashion with a Foley catheter prepped into the field.  A timeout was performed.  Next the necrotic anterior scrotal skin was excised with the Bovie and wound cultures were obtained.  The testes were noted to be distended and perfused.  The abscess cavity connected to perineum and perirectal region.  The case was then turned over to Dr. Donne Hazel with general surgery to debride the perineum.   Plan: Plan to return to OR for washout in 2 days.

## 2020-07-09 NOTE — Progress Notes (Addendum)
  Hypoglycemic Event  CBG: 68  Treatment: D50 25 mL (12.5 gm)  Symptoms: None  Follow-up CBG: Time:1428 CBG Result:118  Possible Reasons for Event: Inadequate meal intake  Comments/MD notified:    Leena Tiede, [place]

## 2020-07-09 NOTE — Progress Notes (Signed)
[facility] Progress Note     Subjective: Patient lying in bed, appears in pain and ill.   Objective: Vital signs in last 24 hours: Temp:  [98.1 F (36.7 C)] 98.1 F (36.7 C) (09/27 1414) Pulse Rate:  [82-95] 84 (09/28 0700) Resp:  [10-28] 17 (09/28 0700) BP: (73-130)/(42-95) 96/56 (09/28 0700) SpO2:  [96 %-100 %] 100 % (09/28 0700) Weight:  [51.7 kg-54.4 kg] 51.7 kg (09/27 1900)    Intake/Output from previous day: No intake/output data recorded. Intake/Output this shift: No intake/output data recorded.  PE: General: pleasant, WD, chronically ill appearing male who is laying in bed in discomfort HEENT:  Sclera are noninjected.  PERRL.  Ears and nose without any masses or lesions.  Mouth is pink and moist Heart: regular, rate, and rhythm.  Palpable radial and pedal pulses bilaterally Lungs: CTAB, no wheezes, rhonchi, or rales noted.  Respiratory effort nonlabored Abd: soft, NT, ND, +BS, no masses, hernias, or organomegaly GU: foley present with some blood at meatus, necrotic scrotum with erythema and induration extending back to perineum  MS: all 4 extremities are symmetrical with no cyanosis, clubbing, or edema. Skin: warm and dry with no masses, lesions, or rashes Psych: disoriented, flat affect.   Lab Results:  Recent Labs    07/08/20 1528 07/09/20 0412  WBC 13.9* 12.0*  HGB 10.6* 8.7*  HCT 33.3* 27.6*  PLT 380 281   BMET Recent Labs    07/08/20 1528  NA 134*  K 5.2*  CL 106  CO2 16*  GLUCOSE 114*  BUN 126*  CREATININE 3.23*  CALCIUM 8.9   PT/INR Recent Labs    07/08/20 1528 07/09/20 0412  LABPROT 21.0* 21.3*  INR 1.9* 1.9*   CMP     Component Value Date/Time   NA 134 (L) 07/08/2020 1528   K 5.2 (H) 07/08/2020 1528   CL 106 07/08/2020 1528   CO2 16 (L) 07/08/2020 1528   GLUCOSE 114 (H) 07/08/2020 1528   GLUCOSE 102 (H) 07/27/2006 1325   BUN 126 (H) 07/08/2020 1528   CREATININE 3.23 (H) 07/08/2020 1528   CALCIUM 8.9  07/08/2020 1528   PROT 5.8 (L) 07/09/2020 0412   ALBUMIN 1.7 (L) 07/09/2020 0412   AST 23 07/09/2020 0412   ALT 33 07/09/2020 0412   ALKPHOS 155 (H) 07/09/2020 0412   BILITOT 0.7 07/09/2020 0412   GFRNONAA 16 (L) 07/08/2020 1528   GFRAA 18 (L) 07/08/2020 1528   Lipase  No results found for: LIPASE     Studies/Results: CT ABDOMEN PELVIS WO CONTRAST  Result Date: 07/08/2020 CLINICAL DATA:  Concern for scrotal abscess/Fournier's gangrene EXAM: CT ABDOMEN AND PELVIS WITHOUT CONTRAST TECHNIQUE: Multidetector CT imaging of the abdomen and pelvis was performed following the standard protocol without IV contrast. COMPARISON:  CT L-spine 07/20/2011, chest radiograph 07/08/2020 FINDINGS: Lower chest: Chronic appearing chest wall deformity is noted likely related to severe scoliotic curvature. There are bandlike and consolidative opacities in the bilateral lung bases centered upon thickened lower lobe airways bilaterally which could reflect some atelectatic change and/or scarring though sequela of aspiration or infection could have a similar appearance. Normal heart size. No pericardial effusion. Three-vessel coronary artery atherosclerosis is noted. Hypoattenuation of the cardiac blood pool rib likely reflecting some mild anemia. Atherosclerotic calcification in the visible portions of the thoracic aorta. Hepatobiliary: Incompletely characterized peripherally hypoattenuating, centrally isoattenuating lesion in the inferior right lobe liver measuring approximately 3.8 x 4.8 x 5.2 cm (6/54, 3/24). No other focal concerning liver  lesions. Gallbladder distention is top-normal. Partially calcified gallstone at the gallbladder neck. No pericholecystic fluid or inflammation. No biliary ductal dilatation or intraductal gallstones. Pancreas: Partial fatty replacement of the pancreas. No pancreatic ductal dilatation or surrounding inflammatory changes. Spleen: Normal in size. No concerning splenic lesions.  Adrenals/Urinary Tract: Normal adrenal glands. Kidneys are symmetric in size and normally located. There are scattered subcentimeter hypoattenuating foci in both kidneys too small to fully characterize on CT imaging but statistically likely benign. Hyperdense renal pyramids left, a nonspecific finding but can be seen urinary obstruction, metabolic derangement or papillary necrosis among other etiologies. There is moderate bilateral hydroureteronephrosis without obstructing calculus. Urinary bladder is circumferentially thickened with numerous bladder diverticula. Stomach/Bowel: Distal esophagus, stomach and duodenal sweep are unremarkable. No small bowel wall thickening or dilatation. No evidence of obstruction. A normal appendix is visualized. Interposition of the hepatic flexure anterior to the liver. There is a large inspissated rectal stool ball with circumferential rectal wall thickening and inflammation as well as stranding in the presacral space. Marked thickening is also seen along the levator plate. Feculent material noted within the gluteal cleft. Vascular/Lymphatic: Atherosclerotic calcifications within the abdominal aorta and branch vessels. Marked tortuosity of the abdominal aorta without frank aneurysm or ectasia. No enlarged abdominopelvic lymph nodes. Reproductive: Nyoka Lint defect in the region of the prostate, could reflect prior prostate intervention. Some mild thickening and heterogeneous enhancement of the prostate could reflect a concomitant prostatitis. Seminal vesicles are poorly visualized. Other: There is extensive thickening along the levator plate. Soft tissue thickening and subcutaneous phlegmon noted along the left gluteal cleft with overlying skin thickening. There is a multilobulated, thick-walled fluid collection extending from this region of phlegmon anteriorly to the base of the penile corpora the inferior extent of these collections and pudendal surfaces incompletely included within  the margins of imaging. No soft tissue gas is seen though a necrotizing infection is not fully excluded on an imaging basis. Presacral fat stranding and perirectal thickening, as detailed above. Mild circumferential body wall edema. Neurostimulator battery pack in the soft tissues of the right flank entering the canal at the T11 level. Terminating above the level of imaging. Musculoskeletal: The osseous structures appear diffusely demineralized which may limit detection of small or nondisplaced fractures. No acute or suspicious osseous lesions are evident. Severe dextrocurvature of the spine. Bony fusion across the L1-L4 levels with severe dextrocurvature at this site. Associated pelvic tilt is noted as well. There is a grade 2 anterolisthesis L5 on S1 and 7 mm of retrolisthesis L4 on L5. Bilateral L5 pars defects are noted. Severe resulting foraminal stenosis at this level. Additional moderate canal impingement C3-4, C4-5. Few scattered benign bone islands. Prior left femoral intramedullary nail and transcervical pin placement traversing a healed deformity of a left intertrochanteric femur fracture. IMPRESSION: 1. Soft tissue thickening and subcutaneous phlegmon along the left gluteal cleft with overlying skin thickening. There is a multilobulated, thick-walled fluid collection extending from this region of phlegmon anteriorly to the base of the penile corpora. The inferior extent of these collections and pudendal surface is incompletely included within the margins of imaging. Associated thickening of the levator plate. A perianal abscess or fistula is not completely excluded. No soft tissue gas is seen though a necrotizing infection is not fully excluded on an imaging basis. Recommend correlation with direct visualization. 2. Large inspissated rectal stool ball with circumferential rectal wall thickening and inflammation as well as stranding in the presacral space. Findings are concerning for stercoral colitis. 3.  Moderate bilateral hydroureteronephrosis without obstructing calculus. Circumferentially thickened urinary bladder with numerous bladder diverticula, suggestive of chronic outlet obstruction with possible superimposed cystitis. Recommend correlation with urinalysis. 4. Keyhole defect in the prostate may reflect prior prostate resection. Heterogeneous appearance of the prostate with adjacent stranding, could reflect concomitant prostatitis. 5. Hyperdense left renal pyramids , a nonspecific finding but can be seen urinary obstruction, metabolic derangement or papillary necrosis among other etiologies. 6. Indeterminate heterogeneous lesion in the inferior right lobe liver measuring approximately 3.8 x 4.8 x 5.2 cm, incompletely characterized on noncontrast CT. Recommend further evaluation with nonemergent liver protocol CT or MRI. 7. Cholelithiasis without evidence of acute cholecystitis. 8. Severe dextrocurvature of the spine across fused L1-L4 levels. Spondylolysis and spondylolisthesis as described above. Severe bilateral foraminal narrowing L5-S1. Moderate canal narrowing L3-4, L4-5. 9. Aortic Atherosclerosis (ICD10-I70.0). These results were called by telephone at the time of interpretation on 07/08/2020 at 8:26 pm to provider DAVID YAO , who verbally acknowledged these results. Electronically Signed   By: Lovena Le M.D.   On: 07/08/2020 20:22   DG Chest 2 View  Result Date: 07/08/2020 CLINICAL DATA:  Cough for 2 weeks. EXAM: CHEST - 2 VIEW COMPARISON:  Single-view of the chest 10/11/2018. FINDINGS: The lungs are clear. Heart size is normal. Aortic atherosclerosis. No pneumothorax or pleural effusion. Cervical fusion hardware is unchanged. Inferior most screw on the left has partially backed out. Spinal stimulator noted. IMPRESSION: No acute disease. Aortic Atherosclerosis (ICD10-I70.0). Electronically Signed   By: Inge Rise M.D.   On: 07/08/2020 14:57   US Scrotum  Result Date:  07/08/2020 CLINICAL DATA:  86 year old with scrotal swelling. Rule out scrotal abscess. EXAM: ULTRASOUND OF SCROTUM TECHNIQUE: Complete ultrasound examination of the testicles, epididymis, and other scrotal structures was performed. COMPARISON:  Included portions from abdominopelvic CT earlier today. FINDINGS: Right testicle Measurements: 3.1 x 0.9 x 2.2 cm. No mass or microlithiasis visualized. Blood flow is noted. No intratesticular collection. There are extra testicular foci in the scrotum with posterior shadowing measuring up to 6 mm. No dirty shadowing to suggest air. Left testicle Measurements: 3.5 x 2.1 x 2.3 cm. No mass or microlithiasis visualized. Blood flow is noted. No intratesticular collection. There are extratesticular foci in the scrotum with posterior shadowing measuring up to 8 mm. No dirty shadowing to suggest air. Right epididymis:  Normal in size and appearance. Left epididymis:  Normal in size and appearance. Hydrocele:  Small on the right.  Fluid appears simple. Varicocele:  None visualized. Other: Diffuse bilateral skin and soft tissue thickening and edema measuring up to 2 cm. No discrete scrotal fluid collection. IMPRESSION: 1. Diffuse scrotal skin and soft tissue thickening measuring up to 2 cm typical cellulitis. No discrete scrotal fluid collection. 2. Small right hydrocele. 3. Extra testicular foci with posterior shadowing measuring up to 6 mm on the right and 8 mm on the left. These are most typical of calcifications and likely postinflammatory. 4. Please note the area of phlegmonous change in the perineum on CT was not evaluated on this scrotal ultrasound. Electronically Signed   By: Keith Rake M.D.   On: 07/08/2020 20:43    Anti-infectives: Anti-infectives (From admission, onward)   Start     Dose/Rate Route Frequency Ordered Stop   07/09/20 2000  ceFEPIme (MAXIPIME) 2 g in sodium chloride 0.9 % 100 mL IVPB        2 g 200 mL/hr over 30 Minutes Intravenous Every 24 hours  07/08/20 1955  07/09/20 0600  metroNIDAZOLE (FLAGYL) IVPB 500 mg        500 mg 100 mL/hr over 60 Minutes Intravenous Every 8 hours 07/09/20 0332     07/08/20 2309  vancomycin variable dose per unstable renal function (pharmacist dosing)         Does not apply See admin instructions 07/08/20 2309     07/08/20 2215  metroNIDAZOLE (FLAGYL) IVPB 500 mg        500 mg 100 mL/hr over 60 Minutes Intravenous  Once 07/08/20 2207 07/08/20 2327   07/08/20 1945  ceFEPIme (MAXIPIME) 2 g in sodium chloride 0.9 % 100 mL IVPB        2 g 200 mL/hr over 30 Minutes Intravenous  Once 07/08/20 1933 07/08/20 2033   07/08/20 1900  vancomycin (VANCOCIN) IVPB 1000 mg/200 mL premix        1,000 mg 200 mL/hr over 60 Minutes Intravenous  Once 07/08/20 1853 07/08/20 2220       Assessment/Plan Dementia CHF HTN HLD Hypothyroidism GERD Scoliosis with chronic back pain Chronic constipation  Anticoagulation on Eliquis Urethral strictures   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

## 2020-07-09 NOTE — Progress Notes (Signed)
Urology Inpatient Progress Report  Cellulitis of scrotum [N49.2]  Procedure(s): IRRIGATION AND DEBRIDEMENT SCROTUM/PERINEUM  Day of Surgery   Intv/Subj: Patient remains in ED overnight waiting for bed.  Seen and evaluated by both uro and gen surg this AM.    Principal Problem:   Cellulitis of scrotum Active Problems:   Hypothyroidism   Hyperlipidemia   Essential hypertension   Dementia ([place])   Stercoral colitis   Obstructive uropathy   Bilateral hydronephrosis   Sepsis due to undetermined organism ([place])   AKI (acute kidney injury) ([place])   Acute UTI (urinary tract infection)   Hyperkalemia  Current Facility-Administered Medications  Medication Dose Route Frequency Provider Last Rate Last Admin  . 0.9 %  sodium chloride infusion   Intravenous Continuous Reubin Milan, MD 50 mL/hr at 07/09/20 0044 New Bag at 07/09/20 0044  . acetaminophen (TYLENOL) tablet 650 mg  650 mg Oral Q6H PRN Reubin Milan, MD       Or  . acetaminophen (TYLENOL) suppository 650 mg  650 mg Rectal Q6H PRN Reubin Milan, MD      . ceFEPIme (MAXIPIME) 2 g in sodium chloride 0.9 % 100 mL IVPB  2 g Intravenous Q24H Reubin Milan, MD      . levothyroxine (SYNTHROID) tablet 50 mcg  50 mcg Oral QAC breakfast Reubin Milan, MD      . liver oil-zinc oxide (DESITIN) 40 % ointment   Topical TID Donnamae Jude, [facility]      . magic mouthwash  5 mL Oral BID Reubin Milan, MD      . metroNIDAZOLE (FLAGYL) IVPB 500 mg  500 mg Intravenous Q8H Reubin Milan, MD   Stopped at 07/09/20 [phone]  . mirtazapine (REMERON) tablet 15 mg  15 mg Oral QHS Reubin Milan, MD      . ondansetron [facility]) tablet 4 mg  4 mg Oral Q6H PRN Reubin Milan, MD       Or  . ondansetron [facility]) injection 4 mg  4 mg Intravenous Q6H PRN Reubin Milan, MD      . polyethylene glycol [facility] Floria Raveling) packet 17 g  17 g Oral Daily PRN Reubin Milan, MD      . prothrombin complex conc human  [facility]) IVPB 2,088 Units  2,088 Units Intravenous STAT Norm Parcel, PA-C      . senna-docusate (Senokot-S) tablet 1 tablet  1 tablet Oral QHS PRN Reubin Milan, MD      . sertraline (ZOLOFT) tablet 75 mg  75 mg Oral Daily Reubin Milan, MD      . traMADol Veatrice Bourbon) tablet 50 mg  50 mg Oral TID PRN Reubin Milan, MD      . vancomycin variable dose per unstable renal function (pharmacist dosing)   Does not apply See admin instructions Mancheril, Darnell Level, [facility]       Current Outpatient Medications  Medication Sig Dispense Refill  . acetaminophen (TYLENOL) 325 MG tablet Take 650 mg by mouth in the morning, at noon, and at bedtime.    Marland Kitchen ELIQUIS 2.5 MG TABS tablet Take 2.5 mg by mouth 2 (two) times daily.    . Infant Care Products (DERMACLOUD) CREA Apply 1 application topically in the morning, at noon, and at bedtime.    Marland Kitchen levofloxacin (LEVAQUIN) 500 MG tablet Take 500 mg by mouth daily.    Marland Kitchen levothyroxine (SYNTHROID) 25 MCG tablet Take 50 mcg by mouth daily before  breakfast.     . magic mouthwash SOLN Take 5 mLs by mouth in the morning and at bedtime.    . mirtazapine (REMERON) 15 MG tablet Take 15 mg by mouth at bedtime.    . Pollen Extracts (PROSTAT PO) Take 30 mLs by mouth in the morning and at bedtime.    . polyethylene glycol (MIRALAX / GLYCOLAX) packet Take 17 g by mouth daily as needed for mild constipation. 14 each 0  . senna-docusate (SENOKOT-S) 8.6-50 MG tablet Take 1 tablet by mouth at bedtime as needed for mild constipation.    . sertraline (ZOLOFT) 25 MG tablet Take 75 mg by mouth daily.     . simvastatin (ZOCOR) 40 MG tablet TAKE 1 TABLET BY MOUTH EVERY DAY AT NIGHT 90 tablet 1  . torsemide (DEMADEX) 20 MG tablet TAKE 1 TABLET BY MOUTH EVERY DAY 90 tablet 1  . traMADol (ULTRAM) 50 MG tablet Take 50 mg by mouth 3 (three) times daily as needed for moderate pain or severe pain.     Marland Kitchen atenolol (TENORMIN) 50 MG tablet TAKE 1 TABLET BY MOUTH EVERY DAY (Patient not  taking: Reported on 07/08/2020) 90 tablet 1  . donepezil (ARICEPT) 5 MG tablet TAKE 1 TABLET BY MOUTH EVERYDAY AT BEDTIME (Patient not taking: Reported on 07/08/2020) 90 tablet 1  . levothyroxine (SYNTHROID) 50 MCG tablet TAKE 1 TABLET BY MOUTH DAILY BEFORE BREAKFAST (Patient not taking: Reported on 07/08/2020) 90 tablet 1  . LORazepam (ATIVAN) 0.5 MG tablet Take 1 tablet (0.5 mg total) by mouth at bedtime as needed for sleep. (Patient not taking: Reported on 07/08/2020) 90 tablet 0     Objective: Vital: Vitals:   07/09/20 0530 07/09/20 0600 07/09/20 0630 07/09/20 0700  BP: (!) 92/58 (!) 103/57 (!) 92/58 (!) 96/56  Pulse: 85 85 84 84  Resp: 16 19 16 17   Temp:      TempSrc:      SpO2: 98% 98% 99% 100%  Weight:      Height:       I/Os: No intake/output data recorded.  Physical Exam:  General: laying in bed without distress, similar to last night Lungs: Normal respiratory effort, chest expands symmetrically. GU: necrotic inferior scrotum with packing in place, softer than yesterday, abscess tracking down perineum towards buttocks Foley: 18Fr council tip draining tea colored urine, pus has cleared  Lab Results: Recent Labs    07/08/20 1528 07/09/20 0412  WBC 13.9* 12.0*  HGB 10.6* 8.7*  HCT 33.3* 27.6*   Recent Labs    07/08/20 1528  NA 134*  K 5.2*  CL 106  CO2 16*  GLUCOSE 114*  BUN 126*  CREATININE 3.23*  CALCIUM 8.9   Recent Labs    07/08/20 1528 07/09/20 0412  INR 1.9* 1.9*   No results for input(s): LABURIN in the last 72 hours. Results for orders placed or performed during the hospital encounter of 07/08/20  Culture, blood (Routine x 2)     Status: None (Preliminary result)   Collection Time: 07/08/20  3:33 PM   Specimen: BLOOD  Result Value Ref Range Status   Specimen Description BLOOD RIGHT ANTECUBITAL  Final   Special Requests   Final    BOTTLES DRAWN AEROBIC AND ANAEROBIC Blood Culture adequate volume   Culture   Final    NO GROWTH < 24  HOURS Performed at [facility], [place] [address], [place], [place] [address]    Report Status PENDING  Incomplete  Culture,  blood (Routine x 2)     Status: None (Preliminary result)   Collection Time: 07/08/20  7:39 PM   Specimen: BLOOD  Result Value Ref Range Status   Specimen Description BLOOD LEFT ANTECUBITAL  Final   Special Requests   Final    BOTTLES DRAWN AEROBIC AND ANAEROBIC Blood Culture adequate volume   Culture   Final    NO GROWTH < 12 HOURS Performed at [facility], [place] [address], [place], [place] [address]    Report Status PENDING  Incomplete  Respiratory Panel by RT PCR (Flu A&B, Covid) - Nasopharyngeal Swab     Status: None   Collection Time: 07/08/20 11:30 PM   Specimen: Nasopharyngeal Swab  Result Value Ref Range Status   SARS Coronavirus 2 by RT PCR NEGATIVE NEGATIVE Final    Comment: (NOTE) SARS-CoV-2 target nucleic acids are NOT DETECTED.  The SARS-CoV-2 RNA is generally detectable in upper respiratoy specimens during the acute phase of infection. The lowest concentration of SARS-CoV-2 viral copies this assay can detect is 131 copies/mL. A negative result does not preclude SARS-Cov-2 infection and should not be used as the sole basis for treatment or other patient management decisions. A negative result may occur with  improper specimen collection/handling, submission of specimen other than nasopharyngeal swab, presence of viral mutation(s) within the areas targeted by this assay, and inadequate number of viral copies (<131 copies/mL). A negative result must be combined with clinical observations, patient history, and epidemiological information. The expected result is Negative.  Fact Sheet for Patients:  PinkCheek.be  Fact Sheet for Healthcare Providers:  GravelBags.it  This test is no t yet approved or cleared by the [place] FDA and  has been authorized for detection and/or  diagnosis of SARS-CoV-2 by FDA under an Emergency Use Authorization (EUA). This EUA will remain  in effect (meaning this test can be used) for the duration of the COVID-19 declaration under Section 564(b)(1) of the Act, 21 U.S.C. section 360bbb-3(b)(1), unless the authorization is terminated or revoked sooner.     Influenza A by PCR NEGATIVE NEGATIVE Final   Influenza B by PCR NEGATIVE NEGATIVE Final    Comment: (NOTE) The Xpert Xpress SARS-CoV-2/FLU/RSV assay is intended as an aid in  the diagnosis of influenza from Nasopharyngeal swab specimens and  should not be used as a sole basis for treatment. Nasal washings and  aspirates are unacceptable for Xpert Xpress SARS-CoV-2/FLU/RSV  testing.  Fact Sheet for Patients: PinkCheek.be  Fact Sheet for Healthcare Providers: GravelBags.it  This test is not yet approved or cleared by the [place] FDA and  has been authorized for detection and/or diagnosis of SARS-CoV-2 by  FDA under an Emergency Use Authorization (EUA). This EUA will remain  in effect (meaning this test can be used) for the duration of the  Covid-19 declaration under Section 564(b)(1) of the Act, 21  U.S.C. section 360bbb-3(b)(1), unless the authorization is  terminated or revoked. Performed at [facility], [place] [address], [place], [place] [address]     Studies/Results: CT ABDOMEN PELVIS WO CONTRAST  Result Date: 07/08/2020 CLINICAL DATA:  Concern for scrotal abscess/Fournier's gangrene EXAM: CT ABDOMEN AND PELVIS WITHOUT CONTRAST TECHNIQUE: Multidetector CT imaging of the abdomen and pelvis was performed following the standard protocol without IV contrast. COMPARISON:  CT L-spine 07/20/2011, chest radiograph 07/08/2020 FINDINGS: Lower chest: Chronic appearing chest wall deformity is noted likely related to severe scoliotic curvature. There are bandlike and consolidative opacities in the bilateral  lung  bases centered upon thickened lower lobe airways bilaterally which could reflect some atelectatic change and/or scarring though sequela of aspiration or infection could have a similar appearance. Normal heart size. No pericardial effusion. Three-vessel coronary artery atherosclerosis is noted. Hypoattenuation of the cardiac blood pool rib likely reflecting some mild anemia. Atherosclerotic calcification in the visible portions of the thoracic aorta. Hepatobiliary: Incompletely characterized peripherally hypoattenuating, centrally isoattenuating lesion in the inferior right lobe liver measuring approximately 3.8 x 4.8 x 5.2 cm (6/54, 3/24). No other focal concerning liver lesions. Gallbladder distention is top-normal. Partially calcified gallstone at the gallbladder neck. No pericholecystic fluid or inflammation. No biliary ductal dilatation or intraductal gallstones. Pancreas: Partial fatty replacement of the pancreas. No pancreatic ductal dilatation or surrounding inflammatory changes. Spleen: Normal in size. No concerning splenic lesions. Adrenals/Urinary Tract: Normal adrenal glands. Kidneys are symmetric in size and normally located. There are scattered subcentimeter hypoattenuating foci in both kidneys too small to fully characterize on CT imaging but statistically likely benign. Hyperdense renal pyramids left, a nonspecific finding but can be seen urinary obstruction, metabolic derangement or papillary necrosis among other etiologies. There is moderate bilateral hydroureteronephrosis without obstructing calculus. Urinary bladder is circumferentially thickened with numerous bladder diverticula. Stomach/Bowel: Distal esophagus, stomach and duodenal sweep are unremarkable. No small bowel wall thickening or dilatation. No evidence of obstruction. A normal appendix is visualized. Interposition of the hepatic flexure anterior to the liver. There is a large inspissated rectal stool ball with circumferential rectal  wall thickening and inflammation as well as stranding in the presacral space. Marked thickening is also seen along the levator plate. Feculent material noted within the gluteal cleft. Vascular/Lymphatic: Atherosclerotic calcifications within the abdominal aorta and branch vessels. Marked tortuosity of the abdominal aorta without frank aneurysm or ectasia. No enlarged abdominopelvic lymph nodes. Reproductive: Nyoka Lint defect in the region of the prostate, could reflect prior prostate intervention. Some mild thickening and heterogeneous enhancement of the prostate could reflect a concomitant prostatitis. Seminal vesicles are poorly visualized. Other: There is extensive thickening along the levator plate. Soft tissue thickening and subcutaneous phlegmon noted along the left gluteal cleft with overlying skin thickening. There is a multilobulated, thick-walled fluid collection extending from this region of phlegmon anteriorly to the base of the penile corpora the inferior extent of these collections and pudendal surfaces incompletely included within the margins of imaging. No soft tissue gas is seen though a necrotizing infection is not fully excluded on an imaging basis. Presacral fat stranding and perirectal thickening, as detailed above. Mild circumferential body wall edema. Neurostimulator battery pack in the soft tissues of the right flank entering the canal at the T11 level. Terminating above the level of imaging. Musculoskeletal: The osseous structures appear diffusely demineralized which may limit detection of small or nondisplaced fractures. No acute or suspicious osseous lesions are evident. Severe dextrocurvature of the spine. Bony fusion across the L1-L4 levels with severe dextrocurvature at this site. Associated pelvic tilt is noted as well. There is a grade 2 anterolisthesis L5 on S1 and 7 mm of retrolisthesis L4 on L5. Bilateral L5 pars defects are noted. Severe resulting foraminal stenosis at this level.  Additional moderate canal impingement C3-4, C4-5. Few scattered benign bone islands. Prior left femoral intramedullary nail and transcervical pin placement traversing a healed deformity of a left intertrochanteric femur fracture. IMPRESSION: 1. Soft tissue thickening and subcutaneous phlegmon along the left gluteal cleft with overlying skin thickening. There is a multilobulated, thick-walled fluid collection extending from this region of  phlegmon anteriorly to the base of the penile corpora. The inferior extent of these collections and pudendal surface is incompletely included within the margins of imaging. Associated thickening of the levator plate. A perianal abscess or fistula is not completely excluded. No soft tissue gas is seen though a necrotizing infection is not fully excluded on an imaging basis. Recommend correlation with direct visualization. 2. Large inspissated rectal stool ball with circumferential rectal wall thickening and inflammation as well as stranding in the presacral space. Findings are concerning for stercoral colitis. 3. Moderate bilateral hydroureteronephrosis without obstructing calculus. Circumferentially thickened urinary bladder with numerous bladder diverticula, suggestive of chronic outlet obstruction with possible superimposed cystitis. Recommend correlation with urinalysis. 4. Keyhole defect in the prostate may reflect prior prostate resection. Heterogeneous appearance of the prostate with adjacent stranding, could reflect concomitant prostatitis. 5. Hyperdense left renal pyramids , a nonspecific finding but can be seen urinary obstruction, metabolic derangement or papillary necrosis among other etiologies. 6. Indeterminate heterogeneous lesion in the inferior right lobe liver measuring approximately 3.8 x 4.8 x 5.2 cm, incompletely characterized on noncontrast CT. Recommend further evaluation with nonemergent liver protocol CT or MRI. 7. Cholelithiasis without evidence of acute  cholecystitis. 8. Severe dextrocurvature of the spine across fused L1-L4 levels. Spondylolysis and spondylolisthesis as described above. Severe bilateral foraminal narrowing L5-S1. Moderate canal narrowing L3-4, L4-5. 9. Aortic Atherosclerosis (ICD10-I70.0). These results were called by telephone at the time of interpretation on 07/08/2020 at 8:26 pm to provider DAVID YAO , who verbally acknowledged these results. Electronically Signed   By: Lovena Le M.D.   On: 07/08/2020 20:22   DG Chest 2 View  Result Date: 07/08/2020 CLINICAL DATA:  Cough for 2 weeks. EXAM: CHEST - 2 VIEW COMPARISON:  Single-view of the chest 10/11/2018. FINDINGS: The lungs are clear. Heart size is normal. Aortic atherosclerosis. No pneumothorax or pleural effusion. Cervical fusion hardware is unchanged. Inferior most screw on the left has partially backed out. Spinal stimulator noted. IMPRESSION: No acute disease. Aortic Atherosclerosis (ICD10-I70.0). Electronically Signed   By: Inge Rise M.D.   On: 07/08/2020 14:57   US Scrotum  Result Date: 07/08/2020 CLINICAL DATA:  86 year old with scrotal swelling. Rule out scrotal abscess. EXAM: ULTRASOUND OF SCROTUM TECHNIQUE: Complete ultrasound examination of the testicles, epididymis, and other scrotal structures was performed. COMPARISON:  Included portions from abdominopelvic CT earlier today. FINDINGS: Right testicle Measurements: 3.1 x 0.9 x 2.2 cm. No mass or microlithiasis visualized. Blood flow is noted. No intratesticular collection. There are extra testicular foci in the scrotum with posterior shadowing measuring up to 6 mm. No dirty shadowing to suggest air. Left testicle Measurements: 3.5 x 2.1 x 2.3 cm. No mass or microlithiasis visualized. Blood flow is noted. No intratesticular collection. There are extratesticular foci in the scrotum with posterior shadowing measuring up to 8 mm. No dirty shadowing to suggest air. Right epididymis:  Normal in size and appearance. Left  epididymis:  Normal in size and appearance. Hydrocele:  Small on the right.  Fluid appears simple. Varicocele:  None visualized. Other: Diffuse bilateral skin and soft tissue thickening and edema measuring up to 2 cm. No discrete scrotal fluid collection. IMPRESSION: 1. Diffuse scrotal skin and soft tissue thickening measuring up to 2 cm typical cellulitis. No discrete scrotal fluid collection. 2. Small right hydrocele. 3. Extra testicular foci with posterior shadowing measuring up to 6 mm on the right and 8 mm on the left. These are most typical of calcifications and likely postinflammatory. 4. Please  note the area of phlegmonous change in the perineum on CT was not evaluated on this scrotal ultrasound. Electronically Signed   By: Keith Rake M.D.   On: 07/08/2020 20:43    Assessment: Procedure(s): IRRIGATION AND DEBRIDEMENT SCROTUM/PERINEUM, Day of Surgery  doing well.  Plan: 1.  Acute urinary retention with bilateral hydronephrosis: -repeat renal US to ensure resolution of hydronephrosis in 5-7 days  2.  Scrotal abscess: to OR with gen surg today for debridement of necrotic tissue, this was discussed with patient's son last night  3.  Urethral stricture: Continue Foley catheter for at least 7 days   Jacalyn Lefevre, MD Urology 07/09/2020, 7:53 AM

## 2020-07-09 NOTE — Progress Notes (Signed)
SLP Cancellation Note  Patient Details Name: Wayne [name] MRN: [id number] DOB: 1925-11-14   Cancelled treatment:       Reason Eval/Treat Not Completed: Fatigue/lethargy limiting ability to participate. Discussed with RN, pt still recovering from procedure today. Will f/u tomorrow.    Conall Vangorder, Katherene Ponto 07/09/2020, 3:09 PM

## 2020-07-09 NOTE — Op Note (Signed)
Excisional debridement:  1.  Progress note or procedure note with a detailed description of the procedure. Preop dx: NSTI perineum and scrotum Postop dx: saa Procedure: debridement perineum Dr Donne Hazel EBL: 50 cc Specimens none Drains none Sponge and needle count correct  Indications: this is a 71 yom with NSTI of scrotum and perineum.  Discussed proceeding to surgery after Kcentra administration.  Will do with urology Dr Claudia Desanctis.  Procedure: Emergency consent obtained.  Dr Claudia Desanctis completed her portion first. I then came in when patient was under general anesthesia.  He had been given abx. scds were on.  He was already in lithotomy. I did a timeout.  I then proceeding to sharply and with cautery debride his perineum.  I removed all dead tissue I could locate. I then obtained hemostasis. I removed skin, subq tissue and some soft tissue.  I then placed a dressing  2.  Tool used for debridement (curette, scapel, etc.)  Scissors and bovie  3.  Frequency of surgical debridement.   First time, will return in 48 hours  4.  Measurement of total devitalized tissue (wound surface) before and after surgical debridement.   My portion was 10x4x3 cm   5.  Area and depth of devitalized tissue removed from wound.  saa  6.  Blood loss and description of tissue removed.  Necrotic tissue, 50 cc  7.  Evidence of the progress of the wound's response to treatment.  A.  Current wound volume (current dimensions and depth).  My portion is 10x4x3  B.  Presence (and extent of) of infection.  None visible now  C.  Presence (and extent of) of non viable tissue.  None now  D.  Other material in the wound that is expected to inhibit healing.  none  8.  Was there any viable tissue removed (measurements): no

## 2020-07-09 NOTE — Consult Note (Signed)
NAME:  Wayne [name], MRN:  [id number], DOB:  08/12/1926, LOS: 1 ADMISSION DATE:  07/08/2020, CONSULTATION DATE:  07/09/20 REFERRING MD:  Starla Link, CHIEF COMPLAINT:  Pain   Brief History   86 year old man with hx of dementia, SNF resident presenting with severe perineal infection post debridement now in postoperative shock.  History of present illness   86 year old man with hx of dementia, SNF resident presenting with severe perineal infection post debridement now in postoperative shock.  History mostly per chart review, patient is oriented but unable to answer detailed questions.  Per chart review sent to ER for worsening scrotal pain.   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 Claudia Desanctis debrided him this morning.  In PACU, hypotensive despite emergence from anesthesia and supplemental fluid. PCCM asked to bring to ICU for closer observation.  Past Medical History  Dementia Protein calorie malnutrition GERD Constipation Diastolic heart failure  Significant Hospital Events   9/28 admitted   Consults:  Urology Surgery  Procedures:  9/28 Exploration, washout and debridement of necrotic scrotum  Significant Diagnostic Tests:  CT A/P IMPRESSION: 1. Soft tissue thickening and subcutaneous phlegmon along the left gluteal cleft with overlying skin thickening. There is a multilobulated, thick-walled fluid collection extending from this region of phlegmon anteriorly to the base of the penile corpora. The inferior extent of these collections and pudendal surface is incompletely included within the margins of imaging. Associated thickening of the levator plate. A perianal abscess or fistula is not completely excluded. No soft tissue gas is seen though a necrotizing infection is not fully excluded on an imaging basis. Recommend correlation with direct visualization. 2. Large inspissated rectal stool ball with circumferential rectal wall thickening and  inflammation as well as stranding in the presacral space. Findings are concerning for stercoral colitis. 3. Moderate bilateral hydroureteronephrosis without obstructing calculus. Circumferentially thickened urinary bladder with numerous bladder diverticula, suggestive of chronic outlet obstruction with possible superimposed cystitis. Recommend correlation with urinalysis. 4. Keyhole defect in the prostate may reflect prior prostate resection. Heterogeneous appearance of the prostate with adjacent stranding, could reflect concomitant prostatitis. 5. Hyperdense left renal pyramids , a nonspecific finding but can be seen urinary obstruction, metabolic derangement or papillary necrosis among other etiologies. 6. Indeterminate heterogeneous lesion in the inferior right lobe liver measuring approximately 3.8 x 4.8 x 5.2 cm, incompletely characterized on noncontrast CT. Recommend further evaluation with nonemergent liver protocol CT or MRI. 7. Cholelithiasis without evidence of acute cholecystitis. 8. Severe dextrocurvature of the spine across fused L1-L4 levels. Spondylolysis and spondylolisthesis as described above. Severe bilateral foraminal narrowing L5-S1. Moderate canal narrowing L3-4, L4-5. 9. Aortic Atherosclerosis (ICD10-I70.0).  Micro Data:  Urine cx>> Wound cx>> Prior MRSA positive  Antimicrobials:  Vanc>> Cefepime>> Flagyl>>   Interim history/subjective:  Consulted  Objective   Blood pressure (!) 90/58, pulse 71, temperature 97.7 F (36.5 C), resp. rate 12, height 5\' 7"  (1.702 m), weight 51.7 kg, SpO2 97 %.        Intake/Output Summary (Last 24 hours) at 07/09/2020 1233 Last data filed at 07/09/2020 9357 Gross per 24 hour  Intake 500 ml  Output --  Net 500 ml   Filed Weights   07/08/20 1412 07/08/20 1900  Weight: 54.4 kg 51.7 kg    Examination: Constitutional: chronically ill cachetic man in no acute distress Eyes: eyes are anicteric, reactive to  light Ears, nose, mouth, and throat: mucous membranes extremely dry, trachea midline, +temporal wasting, poor  dentition Cardiovascular: heart sounds are regular, ext are warm to touch. no edema, + muscle wasting Respiratory: transmitted rattling upper airway sounds Gastrointestinal: abdomen is soft with + BS Skin: No rashes, normal turgor, scrotum wrapped in gauze with small amount of strikethrough, foley in place Neurologic: moves all 4 ext to command Psychiatric: AOx3  Resolved Hospital Problem list   N/A  Assessment & Plan:  Hypovolemic and septic shock exacerbated by debridement.  Source is both obstructive UTI and necrotiznig soft tissue infection of scrotum/perineum. - Albumin 125g as ordered - Add some LR - Continue broad spectrum abx, follow culture data - Pressors to maintain MAP > 60 - Okay to watch in ICU overnight  Dementia, FTT, weight loss, anorexia - Agree with palliative care consult - Suspicion for chronic aspiration, will ask SLP to see - Discussed with family it might be time to start thinking about hospice, they are open to talking with palliative care regarding  Acute renal failure- combination obstructive and pre-renal due to poor PO - Continue foley, aggressive hydration, avoid nephrotixins    Best practice:  Diet: NPO, can do ice chips for comfort Pain/Anxiety/Delirium protocol (if indicated): N/A VAP protocol (if indicated): N/A DVT prophylaxis: SCDs given recent debridement GI prophylaxis: N/A Glucose control: SSI Mobility: BR Code Status: full Family Communication: updated son Disposition: ICU pending shock resolution   Medical Decision Making    Diagnoses that are immediately life threatening include shock, renal failure Critical test findings: necrotizing infection of perineum, acute renal failure, Interventions today to address these diagnoses are debridement, pressors, fluids, antibiotics Likelihood of life-threatening deterioration  without intervention is high.  Labs   CBC: Recent Labs  Lab 07/08/20 1528 07/09/20 0412  WBC 13.9* 12.0*  NEUTROABS 12.6* 9.6*  HGB 10.6* 8.7*  HCT 33.3* 27.6*  MCV 89.8 90.2  PLT 380 295    Basic Metabolic Panel: Recent Labs  Lab 07/08/20 1528  NA 134*  K 5.2*  CL 106  CO2 16*  GLUCOSE 114*  BUN 126*  CREATININE 3.23*  CALCIUM 8.9   GFR: Estimated Creatinine Clearance: 10.9 mL/min (A) (by C-G formula based on SCr of 3.23 mg/dL (H)). Recent Labs  Lab 07/08/20 1519 07/08/20 1528 07/08/20 1935 07/09/20 0412  WBC  --  13.9*  --  12.0*  LATICACIDVEN 2.0*  --  1.6  --     Liver Function Tests: Recent Labs  Lab 07/08/20 1528 07/09/20 0412  AST 44* 23  ALT 45* 33  ALKPHOS 213* 155*  BILITOT 0.6 0.7  PROT 6.6 5.8*  ALBUMIN 2.0* 1.7*   No results for input(s): LIPASE, AMYLASE in the last 168 hours. No results for input(s): AMMONIA in the last 168 hours.  ABG No results found for: PHART, PCO2ART, PO2ART, HCO3, TCO2, ACIDBASEDEF, O2SAT   Coagulation Profile: Recent Labs  Lab 07/08/20 1528 07/09/20 0412  INR 1.9* 1.9*    Cardiac Enzymes: No results for input(s): CKTOTAL, CKMB, CKMBINDEX, TROPONINI in the last 168 hours.  HbA1C: No results found for: HGBA1C  CBG: No results for input(s): GLUCAP in the last 168 hours.  Review of Systems:   Limited due to dementia but he denies current pain or breathlessness  Past Medical History  He,  has a past medical history of Anxiety, Back pain, Cancer ([facility]), CHF (congestive heart failure) ([facility]), Constipation, Dementia ([facility]), Enlarged prostate, GERD (gastroesophageal reflux disease), seasonal allergies, Hyperlipidemia, Hypertension, Hypothyroidism, Insomnia, MRSA (methicillin resistant staph aureus) culture positive, Osteoporosis, Peripheral edema, and Scoliosis.  Surgical History    Past Surgical History:  Procedure Laterality Date  . abdominal cyst     removed-2012;MRSA done by Dr.Gross  . CATARACT  EXTRACTION    . cataracts     bilateral  . CERVICAL DISC SURGERY     x1  . COLONOSCOPY    . ESOPHAGOGASTRODUODENOSCOPY    . HERNIA REPAIR     Right inguinal  . INTRAMEDULLARY (IM) NAIL INTERTROCHANTERIC Left 10/12/2018  . INTRAMEDULLARY (IM) NAIL INTERTROCHANTERIC Left 10/12/2018   Procedure: INTRAMEDULLARY (IM) NAIL INTERTROCHANTRIC;  Surgeon: Renette Butters, MD;  Location: [facility];  Service: Orthopedics;  Laterality: Left;  . LUMBAR DISC SURGERY     x2  . TOE SURGERY     right great toe  . TRANSURETHRAL RESECTION OF PROSTATE     15+yrs ago     Social History   reports that he has never smoked. He has never used smokeless tobacco. He reports that he does not drink alcohol and does not use drugs.   Family History   His family history includes Diabetes in an other family member; Heart disease (age of onset: 28) in his mother; Hypertension in an other family member; Stroke in his father. There is no history of Anesthesia problems, Hypotension, Malignant hyperthermia, or Pseudochol deficiency.   Allergies Allergies  Allergen Reactions  . Doxazosin Mesylate Other (See Comments)    Makes blood pressure  . Propoxyphene Hcl Other (See Comments)    REACTION: upset stomach  . Penicillins Rash    DID THE REACTION INVOLVE: Swelling of the face/tongue/throat, SOB, or low BP? No Sudden or severe rash/hives, skin peeling, or the inside of the mouth or nose? Yes Did it require medical treatment? Yes When did it last happen?50 yrs ago If all above answers are "NO", may proceed with cephalosporin use.      Home Medications  Prior to Admission medications   Medication Sig Start Date End Date Taking? Authorizing Provider  acetaminophen (TYLENOL) 325 MG tablet Take 650 mg by mouth in the morning, at noon, and at bedtime.   Yes [provider]  ELIQUIS 2.5 MG TABS tablet Take 2.5 mg by mouth 2 (two) times daily. 06/25/20  Yes [provider]  Infant Care Products  (DERMACLOUD) CREA Apply 1 application topically in the morning, at noon, and at bedtime.   Yes [provider]  levofloxacin (LEVAQUIN) 500 MG tablet Take 500 mg by mouth daily. 07/06/20  Yes [provider]  levothyroxine (SYNTHROID) 25 MCG tablet Take 50 mcg by mouth daily before breakfast.  06/29/20  Yes [provider]  magic mouthwash SOLN Take 5 mLs by mouth in the morning and at bedtime.   Yes [provider]  mirtazapine (REMERON) 15 MG tablet Take 15 mg by mouth at bedtime. 06/11/20  Yes [provider]  Pollen Extracts (PROSTAT PO) Take 30 mLs by mouth in the morning and at bedtime.   Yes [provider]  polyethylene glycol (MIRALAX / GLYCOLAX) packet Take 17 g by mouth daily as needed for mild constipation. 10/15/18  Yes Shelly Coss, MD  senna-docusate (SENOKOT-S) 8.6-50 MG tablet Take 1 tablet by mouth at bedtime as needed for mild constipation. 10/15/18  Yes Shelly Coss, MD  sertraline (ZOLOFT) 25 MG tablet Take 75 mg by mouth daily.  06/25/20  Yes [provider]  simvastatin (ZOCOR) 40 MG tablet TAKE 1 TABLET BY MOUTH EVERY DAY AT NIGHT 06/08/19  Yes Isaac Bliss, Rayford Halsted, MD  torsemide (DEMADEX) 20 MG tablet TAKE 1 TABLET BY MOUTH EVERY DAY 07/25/19  Yes Isaac Bliss, Rayford Halsted, MD  traMADol (ULTRAM) 50 MG tablet Take 50 mg by mouth 3 (three) times daily as needed for moderate pain or severe pain.  07/08/20  Yes [provider]  atenolol (TENORMIN) 50 MG tablet TAKE 1 TABLET BY MOUTH EVERY DAY Patient not taking: Reported on 07/08/2020 07/11/19   Isaac Bliss, Rayford Halsted, MD  donepezil (ARICEPT) 5 MG tablet TAKE 1 TABLET BY MOUTH EVERYDAY AT BEDTIME Patient not taking: Reported on 07/08/2020 07/11/19   Isaac Bliss, Rayford Halsted, MD  levothyroxine (SYNTHROID) 50 MCG tablet TAKE 1 TABLET BY MOUTH DAILY BEFORE BREAKFAST Patient not taking: Reported on 07/08/2020 09/27/19   Isaac Bliss, Rayford Halsted, MD   LORazepam (ATIVAN) 0.5 MG tablet Take 1 tablet (0.5 mg total) by mouth at bedtime as needed for sleep. Patient not taking: Reported on 07/08/2020 02/09/19   Isaac Bliss, Rayford Halsted, MD     Critical care time: 34 minutes

## 2020-07-09 NOTE — Progress Notes (Signed)
Patient ID: Wayne [name], male   DOB: [birth year], 86 y.o.   MRN: [id number]  PROGRESS NOTE    Wayne [name]  Wayne [name]:[id number] DOB: [birth year] DOA: 07/08/2020 PCP: Isaac Bliss, Rayford Halsted, MD   Brief Narrative:  86 year old male with history of anxiety, dementia, chronic back pain and neck pain, unspecified skin cancer, chronic diastolic CHF, constipation, BPH with history of self-catheterization in the past, GERD, seasonal allergies, hyperlipidemia, hypertension, hypothyroidism, insomnia, history of MRSA, osteoporosis, peripheral edema, scoliosis presented from SNF due to scrotum swelling and redness.  On presentation, patient was afebrile.  UA was suggestive of UTI.  WBC of 13.9 with lactic acid of 2.0 and repeat was 1.6.  Creatinine of 3.23, baseline around 1.7.  Chest x-ray was negative for acute cardiopulmonary disease. CT abdomen/pelvis with extensive phlegmon from the left gluteal cleft all the way to the base of the penile corpora.  Moderate bilateral hydro-retronephrosis without obstructing calculus.  There is possible superimposed cystitis.  Heterogeneous liver mass, no other findings.  A scrotal ultrasound shows extensive soft tissue edema.  He was started on IV fluids and broad-spectrum antibiotics.  Urology and general surgery was consulted.  Assessment & Plan:   Severe sepsis: Present on admission Scrotal cellulitis with abscess UTI -Patient presented with scrotal swelling and redness along with leukocytosis, acute kidney injury secondary to scrotal cellulitis/abscess and UTI -Continue broad-spectrum antibiotics and IV fluids.  General surgery and urology following and planning for surgical intervention today.  Acute kidney injury Obstructive uropathy Bilateral hydronephrosis -Continue IV fluids.  Foley catheter was placed on presentation.  Avoid nephrotoxic medications.  Repeat renal function pending this morning.  Monitor. -Urology following.  Follow  recommendations  Leukocytosis -Monitor  Hyperkalemia -Repeat labs pending this morning.  Continue IV hydration  Essential hypertension -Monitor blood pressure.  Torsemide on hold.  Blood pressure on the lower side.  Hypothyroidism -Resume Synthroid once able to take orally resume  Hyperlipidemia -Hold simvastatin.   Anxiety -Continue mirtazapine and sertraline once able to take orally  Dementia Generalized deconditioning -fall precautions -palliative care evaluation for goals of care. Overall prognosis is guarded to poor  Stercoral colitis -Continue MiraLAX and Senokot. -May need to be treated with an enema.  On anticoagulation -No diagnosis documented: Paroxysmal atrial fibrillation, PE, DVT, etc.? -Has a previous EKG with significant sinus arrhythmia. -Eliquis has been held pending procedure.    DVT prophylaxis: Eliquis held Code Status: Full  family Communication: None at bedside Disposition Plan: Status is: Inpatient  Remains inpatient appropriate because:Inpatient level of care appropriate due to severity of illness   Dispo: The patient is from: SNF              Anticipated d/c is to: SNF              Anticipated d/c date is: > 3 days              Patient currently is not medically stable to d/c.   Consultants: Urology/general surgery/palliative care  Procedures: None  Antimicrobials:  Anti-infectives (From admission, onward)   Start     Dose/Rate Route Frequency Ordered Stop   07/09/20 2000  [MAR Hold]  ceFEPIme (MAXIPIME) 2 g in sodium chloride 0.9 % 100 mL IVPB        (MAR Hold since Tue 07/09/2020 at 0757.Hold Reason: Transfer to a Procedural area.)   2 g 200 mL/hr over 30 Minutes Intravenous Every 24 hours 07/08/20 1955     07/09/20 0600  [MAR  Hold]  metroNIDAZOLE (FLAGYL) IVPB 500 mg        (MAR Hold since Tue 07/09/2020 at 0757.Hold Reason: Transfer to a Procedural area.)   500 mg 100 mL/hr over 60 Minutes Intravenous Every 8 hours 07/09/20  0332     07/08/20 2309  [MAR Hold]  vancomycin variable dose per unstable renal function (pharmacist dosing)        (MAR Hold since Tue 07/09/2020 at 0757.Hold Reason: Transfer to a Procedural area.)    Does not apply See admin instructions 07/08/20 2309     07/08/20 2215  metroNIDAZOLE (FLAGYL) IVPB 500 mg        500 mg 100 mL/hr over 60 Minutes Intravenous  Once 07/08/20 2207 07/08/20 2327   07/08/20 1945  ceFEPIme (MAXIPIME) 2 g in sodium chloride 0.9 % 100 mL IVPB        2 g 200 mL/hr over 30 Minutes Intravenous  Once 07/08/20 1933 07/08/20 2033   07/08/20 1900  vancomycin (VANCOCIN) IVPB 1000 mg/200 mL premix        1,000 mg 200 mL/hr over 60 Minutes Intravenous  Once 07/08/20 1853 07/08/20 2220       Subjective: Patient seen and examined at bedside.  Poor historian; hardly answers any questions.  Objective: Vitals:   07/09/20 0530 07/09/20 0600 07/09/20 0630 07/09/20 0700  BP: (!) 92/58 (!) 103/57 (!) 92/58 (!) 96/56  Pulse: 85 85 84 84  Resp: 16 19 16 17   Temp:      TempSrc:      SpO2: 98% 98% 99% 100%  Weight:      Height:       No intake or output data in the 24 hours ending 07/09/20 0814 Filed Weights   07/08/20 1412 07/08/20 1900  Weight: 54.4 kg 51.7 kg    Examination:  General exam: Looks chronically ill and malnourished.  No acute distress. Respiratory system: Bilateral decreased breath sounds at bases with scattered crackles Cardiovascular system: S1 & S2 heard, Rate controlled Gastrointestinal system: Abdomen is nondistended, soft and nontender. Normal bowel sounds heard. Extremities: No cyanosis, clubbing; trace lower extremity edema Genitourinary: Extensive scrotal erythema and edema extending into the perineum Central nervous system: Wakes up slightly, hardly answers any questions.  No focal neurological deficits. Moving extremities Skin: Scrotal findings as above Psychiatry: Could not be assessed because of mental status    Data Reviewed: I have  personally reviewed following labs and imaging studies  CBC: Recent Labs  Lab 07/08/20 1528 07/09/20 0412  WBC 13.9* 12.0*  NEUTROABS 12.6* 9.6*  HGB 10.6* 8.7*  HCT 33.3* 27.6*  MCV 89.8 90.2  PLT 380 756   Basic Metabolic Panel: Recent Labs  Lab 07/08/20 1528  NA 134*  K 5.2*  CL 106  CO2 16*  GLUCOSE 114*  BUN 126*  CREATININE 3.23*  CALCIUM 8.9   GFR: Estimated Creatinine Clearance: 10.9 mL/min (A) (by C-G formula based on SCr of 3.23 mg/dL (H)). Liver Function Tests: Recent Labs  Lab 07/08/20 1528 07/09/20 0412  AST 44* 23  ALT 45* 33  ALKPHOS 213* 155*  BILITOT 0.6 0.7  PROT 6.6 5.8*  ALBUMIN 2.0* 1.7*   No results for input(s): LIPASE, AMYLASE in the last 168 hours. No results for input(s): AMMONIA in the last 168 hours. Coagulation Profile: Recent Labs  Lab 07/08/20 1528 07/09/20 0412  INR 1.9* 1.9*   Cardiac Enzymes: No results for input(s): CKTOTAL, CKMB, CKMBINDEX, TROPONINI in the last 168 hours. BNP (last  3 results) No results for input(s): PROBNP in the last 8760 hours. HbA1C: No results for input(s): HGBA1C in the last 72 hours. CBG: No results for input(s): GLUCAP in the last 168 hours. Lipid Profile: No results for input(s): CHOL, HDL, LDLCALC, TRIG, CHOLHDL, LDLDIRECT in the last 72 hours. Thyroid Function Tests: No results for input(s): TSH, T4TOTAL, FREET4, T3FREE, THYROIDAB in the last 72 hours. Anemia Panel: No results for input(s): VITAMINB12, FOLATE, FERRITIN, TIBC, IRON, RETICCTPCT in the last 72 hours. Sepsis Labs: Recent Labs  Lab 07/08/20 1519 07/08/20 1935  LATICACIDVEN 2.0* 1.6    Recent Results (from the past 240 hour(s))  Culture, blood (Routine x 2)     Status: None (Preliminary result)   Collection Time: 07/08/20  3:33 PM   Specimen: BLOOD  Result Value Ref Range Status   Specimen Description BLOOD RIGHT ANTECUBITAL  Final   Special Requests   Final    BOTTLES DRAWN AEROBIC AND ANAEROBIC Blood Culture  adequate volume   Culture   Final    NO GROWTH < 24 HOURS Performed at [facility], [address], [place], [place] [address]    Report Status PENDING  Incomplete  Culture, blood (Routine x 2)     Status: None (Preliminary result)   Collection Time: 07/08/20  7:39 PM   Specimen: BLOOD  Result Value Ref Range Status   Specimen Description BLOOD LEFT ANTECUBITAL  Final   Special Requests   Final    BOTTLES DRAWN AEROBIC AND ANAEROBIC Blood Culture adequate volume   Culture   Final    NO GROWTH < 12 HOURS Performed at [facility], [place] [address], [place], [place] [address]    Report Status PENDING  Incomplete  Respiratory Panel by RT PCR (Flu A&B, Covid) - Nasopharyngeal Swab     Status: None   Collection Time: 07/08/20 11:30 PM   Specimen: Nasopharyngeal Swab  Result Value Ref Range Status   SARS Coronavirus 2 by RT PCR NEGATIVE NEGATIVE Final    Comment: (NOTE) SARS-CoV-2 target nucleic acids are NOT DETECTED.  The SARS-CoV-2 RNA is generally detectable in upper respiratoy specimens during the acute phase of infection. The lowest concentration of SARS-CoV-2 viral copies this assay can detect is 131 copies/mL. A negative result does not preclude SARS-Cov-2 infection and should not be used as the sole basis for treatment or other patient management decisions. A negative result may occur with  improper specimen collection/handling, submission of specimen other than nasopharyngeal swab, presence of viral mutation(s) within the areas targeted by this assay, and inadequate number of viral copies (<131 copies/mL). A negative result must be combined with clinical observations, patient history, and epidemiological information. The expected result is Negative.  Fact Sheet for Patients:  PinkCheek.be  Fact Sheet for Healthcare Providers:  GravelBags.it  This test is no t yet approved or cleared by the [place] FDA and  has been authorized for detection and/or diagnosis of SARS-CoV-2 by FDA under an Emergency Use Authorization (EUA). This EUA will remain  in effect (meaning this test can be used) for the duration of the COVID-19 declaration under Section 564(b)(1) of the Act, 21 U.S.C. section 360bbb-3(b)(1), unless the authorization is terminated or revoked sooner.     Influenza A by PCR NEGATIVE NEGATIVE Final   Influenza B by PCR NEGATIVE NEGATIVE Final    Comment: (NOTE) The Xpert Xpress SARS-CoV-2/FLU/RSV assay is intended as an aid in  the diagnosis of influenza from Nasopharyngeal  swab specimens and  should not be used as a sole basis for treatment. Nasal washings and  aspirates are unacceptable for Xpert Xpress SARS-CoV-2/FLU/RSV  testing.  Fact Sheet for Patients: PinkCheek.be  Fact Sheet for Healthcare Providers: GravelBags.it  This test is not yet approved or cleared by the [place] FDA and  has been authorized for detection and/or diagnosis of SARS-CoV-2 by  FDA under an Emergency Use Authorization (EUA). This EUA will remain  in effect (meaning this test can be used) for the duration of the  Covid-19 declaration under Section 564(b)(1) of the Act, 21  U.S.C. section 360bbb-3(b)(1), unless the authorization is  terminated or revoked. Performed at [facility], [place] [address] [place], [place] [address]          Radiology Studies: CT ABDOMEN PELVIS WO CONTRAST  Result Date: 07/08/2020 CLINICAL DATA:  Concern for scrotal abscess/Fournier's gangrene EXAM: CT ABDOMEN AND PELVIS WITHOUT CONTRAST TECHNIQUE: Multidetector CT imaging of the abdomen and pelvis was performed following the standard protocol without IV contrast. COMPARISON:  CT L-spine 07/20/2011, chest radiograph 07/08/2020 FINDINGS: Lower chest: Chronic appearing chest wall deformity is noted likely related to severe scoliotic curvature.  There are bandlike and consolidative opacities in the bilateral lung bases centered upon thickened lower lobe airways bilaterally which could reflect some atelectatic change and/or scarring though sequela of aspiration or infection could have a similar appearance. Normal heart size. No pericardial effusion. Three-vessel coronary artery atherosclerosis is noted. Hypoattenuation of the cardiac blood pool rib likely reflecting some mild anemia. Atherosclerotic calcification in the visible portions of the thoracic aorta. Hepatobiliary: Incompletely characterized peripherally hypoattenuating, centrally isoattenuating lesion in the inferior right lobe liver measuring approximately 3.8 x 4.8 x 5.2 cm (6/54, 3/24). No other focal concerning liver lesions. Gallbladder distention is top-normal. Partially calcified gallstone at the gallbladder neck. No pericholecystic fluid or inflammation. No biliary ductal dilatation or intraductal gallstones. Pancreas: Partial fatty replacement of the pancreas. No pancreatic ductal dilatation or surrounding inflammatory changes. Spleen: Normal in size. No concerning splenic lesions. Adrenals/Urinary Tract: Normal adrenal glands. Kidneys are symmetric in size and normally located. There are scattered subcentimeter hypoattenuating foci in both kidneys too small to fully characterize on CT imaging but statistically likely benign. Hyperdense renal pyramids left, a nonspecific finding but can be seen urinary obstruction, metabolic derangement or papillary necrosis among other etiologies. There is moderate bilateral hydroureteronephrosis without obstructing calculus. Urinary bladder is circumferentially thickened with numerous bladder diverticula. Stomach/Bowel: Distal esophagus, stomach and duodenal sweep are unremarkable. No small bowel wall thickening or dilatation. No evidence of obstruction. A normal appendix is visualized. Interposition of the hepatic flexure anterior to the liver. There is  a large inspissated rectal stool ball with circumferential rectal wall thickening and inflammation as well as stranding in the presacral space. Marked thickening is also seen along the levator plate. Feculent material noted within the gluteal cleft. Vascular/Lymphatic: Atherosclerotic calcifications within the abdominal aorta and branch vessels. Marked tortuosity of the abdominal aorta without frank aneurysm or ectasia. No enlarged abdominopelvic lymph nodes. Reproductive: Nyoka Lint defect in the region of the prostate, could reflect prior prostate intervention. Some mild thickening and heterogeneous enhancement of the prostate could reflect a concomitant prostatitis. Seminal vesicles are poorly visualized. Other: There is extensive thickening along the levator plate. Soft tissue thickening and subcutaneous phlegmon noted along the left gluteal cleft with overlying skin thickening. There is a multilobulated, thick-walled fluid collection extending from this region of phlegmon anteriorly to the base of  the penile corpora the inferior extent of these collections and pudendal surfaces incompletely included within the margins of imaging. No soft tissue gas is seen though a necrotizing infection is not fully excluded on an imaging basis. Presacral fat stranding and perirectal thickening, as detailed above. Mild circumferential body wall edema. Neurostimulator battery pack in the soft tissues of the right flank entering the canal at the T11 level. Terminating above the level of imaging. Musculoskeletal: The osseous structures appear diffusely demineralized which may limit detection of small or nondisplaced fractures. No acute or suspicious osseous lesions are evident. Severe dextrocurvature of the spine. Bony fusion across the L1-L4 levels with severe dextrocurvature at this site. Associated pelvic tilt is noted as well. There is a grade 2 anterolisthesis L5 on S1 and 7 mm of retrolisthesis L4 on L5. Bilateral L5 pars  defects are noted. Severe resulting foraminal stenosis at this level. Additional moderate canal impingement C3-4, C4-5. Few scattered benign bone islands. Prior left femoral intramedullary nail and transcervical pin placement traversing a healed deformity of a left intertrochanteric femur fracture. IMPRESSION: 1. Soft tissue thickening and subcutaneous phlegmon along the left gluteal cleft with overlying skin thickening. There is a multilobulated, thick-walled fluid collection extending from this region of phlegmon anteriorly to the base of the penile corpora. The inferior extent of these collections and pudendal surface is incompletely included within the margins of imaging. Associated thickening of the levator plate. A perianal abscess or fistula is not completely excluded. No soft tissue gas is seen though a necrotizing infection is not fully excluded on an imaging basis. Recommend correlation with direct visualization. 2. Large inspissated rectal stool ball with circumferential rectal wall thickening and inflammation as well as stranding in the presacral space. Findings are concerning for stercoral colitis. 3. Moderate bilateral hydroureteronephrosis without obstructing calculus. Circumferentially thickened urinary bladder with numerous bladder diverticula, suggestive of chronic outlet obstruction with possible superimposed cystitis. Recommend correlation with urinalysis. 4. Keyhole defect in the prostate may reflect prior prostate resection. Heterogeneous appearance of the prostate with adjacent stranding, could reflect concomitant prostatitis. 5. Hyperdense left renal pyramids , a nonspecific finding but can be seen urinary obstruction, metabolic derangement or papillary necrosis among other etiologies. 6. Indeterminate heterogeneous lesion in the inferior right lobe liver measuring approximately 3.8 x 4.8 x 5.2 cm, incompletely characterized on noncontrast CT. Recommend further evaluation with nonemergent  liver protocol CT or MRI. 7. Cholelithiasis without evidence of acute cholecystitis. 8. Severe dextrocurvature of the spine across fused L1-L4 levels. Spondylolysis and spondylolisthesis as described above. Severe bilateral foraminal narrowing L5-S1. Moderate canal narrowing L3-4, L4-5. 9. Aortic Atherosclerosis (ICD10-I70.0). These results were called by telephone at the time of interpretation on 07/08/2020 at 8:26 pm to provider DAVID YAO , who verbally acknowledged these results. Electronically Signed   By: Lovena Le M.D.   On: 07/08/2020 20:22   DG Chest 2 View  Result Date: 07/08/2020 CLINICAL DATA:  Cough for 2 weeks. EXAM: CHEST - 2 VIEW COMPARISON:  Single-view of the chest 10/11/2018. FINDINGS: The lungs are clear. Heart size is normal. Aortic atherosclerosis. No pneumothorax or pleural effusion. Cervical fusion hardware is unchanged. Inferior most screw on the left has partially backed out. Spinal stimulator noted. IMPRESSION: No acute disease. Aortic Atherosclerosis (ICD10-I70.0). Electronically Signed   By: Inge Rise M.D.   On: 07/08/2020 14:57   US Scrotum  Result Date: 07/08/2020 CLINICAL DATA:  86 year old with scrotal swelling. Rule out scrotal abscess. EXAM: ULTRASOUND OF SCROTUM TECHNIQUE: Complete ultrasound examination  of the testicles, epididymis, and other scrotal structures was performed. COMPARISON:  Included portions from abdominopelvic CT earlier today. FINDINGS: Right testicle Measurements: 3.1 x 0.9 x 2.2 cm. No mass or microlithiasis visualized. Blood flow is noted. No intratesticular collection. There are extra testicular foci in the scrotum with posterior shadowing measuring up to 6 mm. No dirty shadowing to suggest air. Left testicle Measurements: 3.5 x 2.1 x 2.3 cm. No mass or microlithiasis visualized. Blood flow is noted. No intratesticular collection. There are extratesticular foci in the scrotum with posterior shadowing measuring up to 8 mm. No dirty shadowing to  suggest air. Right epididymis:  Normal in size and appearance. Left epididymis:  Normal in size and appearance. Hydrocele:  Small on the right.  Fluid appears simple. Varicocele:  None visualized. Other: Diffuse bilateral skin and soft tissue thickening and edema measuring up to 2 cm. No discrete scrotal fluid collection. IMPRESSION: 1. Diffuse scrotal skin and soft tissue thickening measuring up to 2 cm typical cellulitis. No discrete scrotal fluid collection. 2. Small right hydrocele. 3. Extra testicular foci with posterior shadowing measuring up to 6 mm on the right and 8 mm on the left. These are most typical of calcifications and likely postinflammatory. 4. Please note the area of phlegmonous change in the perineum on CT was not evaluated on this scrotal ultrasound. Electronically Signed   By: Keith Rake M.D.   On: 07/08/2020 20:43        Scheduled Meds: . chlorhexidine      . [MAR Hold] levothyroxine  50 mcg Oral QAC breakfast  . [MAR Hold] liver oil-zinc oxide   Topical TID  . [MAR Hold] magic mouthwash  5 mL Oral BID  . [MAR Hold] mirtazapine  15 mg Oral QHS  . [MAR Hold] sertraline  75 mg Oral Daily  . [MAR Hold] vancomycin variable dose per unstable renal function (pharmacist dosing)   Does not apply See admin instructions   Continuous Infusions: . sodium chloride 50 mL/hr at 07/09/20 0044  . [MAR Hold] ceFEPime (MAXIPIME) IV    . [MAR Hold] metronidazole Stopped (07/09/20 0742)  . [MAR Hold] prothrombin complex conc human (Kcentra) IVPB            Aline August, MD [facility] 07/09/2020, 8:14 AM

## 2020-07-09 NOTE — Transfer of Care (Signed)
Immediate Anesthesia Transfer of Care Note  Patient: Wayne [name]  Procedure(s) Performed: IRRIGATION AND DEBRIDEMENT SCROTUM AND PERINEUM (N/A Perineum) DEBRIDEMENT OF SCROTUM (N/A Perineum)  Patient Location: PACU  Anesthesia Type:General  Level of Consciousness: awake, alert  and oriented  Airway & Oxygen Therapy: Patient Spontanous Breathing and Patient connected to nasal cannula oxygen  Post-op Assessment: Report given to RN and Post -op Vital signs reviewed and stable  Post vital signs: Reviewed and stable  Last Vitals:  Vitals Value Taken Time  BP 154/67 07/09/20 1016  Temp    Pulse 86 07/09/20 1016  Resp 15 07/09/20 1016  SpO2 100 % 07/09/20 1016  Vitals shown include unvalidated device data.  Last Pain:  Vitals:   07/08/20 2045  TempSrc:   PainSc: 4       Patients Stated Pain Goal: 2 (96/72/89 7915)  Complications: No complications documented.

## 2020-07-09 NOTE — Consult Note (Signed)
Consultation Note Date: 07/09/2020   Patient Name: Wayne [name]  DOB: January 12, 1926  MRN: [id number]  Age / Sex: 86 y.o., male  PCP: Isaac Bliss, Rayford Halsted, MD Referring Physician: Candee Furbish, MD  Reason for Consultation: Establishing goals of care  HPI/Patient Profile: 86 y.o. male  with past medical history of hypertension, hyperlipidemia, chronic back pain, BPH,  hypothyroidism presented to [facility] emergency department via EMS on 07/08/2020 with complaint of scrotal erythema and edema.  ED course: Vital signs were stable. Patient was treated with IV morphine, IV fluids, and antibiotics.  Urinalysis showed brown and turbid urine with large hemoglobinuria and moderate leukocytes. Labs significant for WBC 13.9, hemoglobin 10.6, platelets 380, lactic acid 2.0, sodium 134, potassium, 5.2, creatinine 3.23, AST and ALT mildly elevated in the 40's.  CT abdomen/pelvis with extensive phlegmon from the left gluteal cleft all the way to the base of the penile corpora. Moderate bilateral hydro-retronephrosis without obstructing calculus. Possible superimposed cystitis. Scrotal ultrasound shows extensive soft tissue edema.   Patient went to the OR 9/28 for excisional debridement for NSTI of scrotum and perineum  Past Medical History: - hypertension - hyperlipidemia - chronic back pain (s/p cervical and lumbar disc surgery) - hypothyroidism - BPH with outlet obstruction - right groin cellulitis/abscess (2012), with culture + for MRSA - ?CHF (cardiology note from 2017 stated heart failure not strongly suspected, but would suspect venous insufficiency)  Primary decision maker: Jermane Brayboy (son and HCPOA) [phone]  Clinical Assessment and Goals of Care: I have reviewed medical records including EPIC notes, labs and imaging, examined the patient and met at bedside with son/David  to discuss diagnosis, prognosis,  GOC, EOL wishes, disposition, and options.  I introduced Palliative Medicine as specialized medical care for people living with serious illness. It focuses on providing relief from the symptoms and stress of a serious illness.   We discussed a brief life review of the patient. He was first generation American (his father immigrated from [place]). He attended [employer], and later served in the [employer]. 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 He and wife Marcie Bal have been married about 69 years. They have 4 children. Marcie Bal has rapidly progressively dementia, and now lives in a memory care facility.   Patient has lived at [facility] since early 2020. He fell and suffered a hip fracture in December 2019, and [name] reports he never fully recovered from that. As far as functional and nutritional status, [name] reports a decline over the past few months. [name] does not know if he is still able to ambulate. Patient has lost weight, and nursing home staff constantly expresses concern about his decreased po intake.    We discussed his current illness and what it means in the larger context of his ongoing co-morbidities.  Natural disease trajectory of advanced age and failure to thrive was discussed. We discussed that surgery is a major event and he will likely not return to his prior baseline.   I attempted to  elicit values and goals of care important to the patient. He always valued being active and independent, so going to the nursing home was a major life transition and very difficult for him.    The difference between aggressive medical intervention and comfort care was considered in light of the patient's goals of care.   Advanced directives, concepts specific to code status, artifical feeding and hydration, and rehospitalization were considered and discussed. I showed [name] the durable DNR on file in the ACP section of Epic (Vynca); he confirms this would have been his  father's wishes.   Hospice and Palliative Care services outpatient were explained and offered. Merrilee Jansky that he is open to hospice care.   Questions and concerns were addressed.  Encouraged family to call with questions or concerns.    SUMMARY OF RECOMMENDATIONS   - patient has a durable DNR--I have changed code status to DNR in epic - [name] will email the HCPOA documents to me so I can scan them into epic - continue current plan of care for now - speech evaluation pending - son is receptive to hospice care on discharge - PMT will continue to follow  Code Status/Advance Care Planning:  DNR (present on admission)   Symptom Management:   Tramadol 50 mg 3 times daily as needed for pain  Palliative Prophylaxis:   Aspiration, Frequent Pain Assessment, Oral Care and Turn Reposition  Additional Recommendations (Limitations, Scope, Preferences):  No Artificial Feeding  Psycho-social/Spiritual:   Created space and opportunity for family to express thoughts and feelings regarding patient's current medical situation.   Emotional support provided   Prognosis:   Unable to determine, dependent on po intake going forward  Discharge Planning: To Be Determined      Primary Diagnoses: Present on Admission: . Cellulitis of scrotum . Essential hypertension . Hyperlipidemia . Hypothyroidism . Dementia ([place]) . Stercoral colitis . Obstructive uropathy . Bilateral hydronephrosis . Sepsis due to undetermined organism ([place]) . AKI (acute kidney injury) ([place]) . Acute UTI (urinary tract infection) . Hyperkalemia   I have reviewed the medical record, interviewed the patient and family, and examined the patient. The following aspects are pertinent.  Past Medical History:  Diagnosis Date  . Anxiety    takes Atrivan daily  . Back pain    chronic with neck pain  . Cancer ([place])    skin  . CHF (congestive heart failure) ([place])   . Constipation    related to medication  .  Dementia ([place])    takes Aricept nightly  . Enlarged prostate    self caths once every 1-2wks  . GERD (gastroesophageal reflux disease)    doesn't require medication  . Hx of seasonal allergies   . Hyperlipidemia    takes Simvastatin daily  . Hypertension    takes atenolol daily  . Hypothyroidism    takes Synthroid daily  . Insomnia    related to pain  . MRSA (methicillin resistant staph aureus) culture positive    Per patient tested on 10/07/11.  . Osteoporosis   . Peripheral edema    takes Torsemide daily  . Scoliosis     Scheduled Meds: . Chlorhexidine Gluconate Cloth  6 each Topical Daily  . insulin aspart  0-6 Units Subcutaneous Q4H  . [START ON 07/10/2020] levothyroxine  50 mcg Oral QAC breakfast  . liver oil-zinc oxide   Topical TID  . magic mouthwash  5 mL Oral BID  . mirtazapine  15 mg Oral QHS  .  sertraline  75 mg Oral Daily  . vancomycin variable dose per unstable renal function (pharmacist dosing)   Does not apply See admin instructions   Continuous Infusions: . sodium chloride 50 mL/hr at 07/09/20 0044  . albumin human 25 g (07/09/20 1331)  . ceFEPime (MAXIPIME) IV    . lactated ringers 125 mL/hr at 07/09/20 1259  . metronidazole Stopped (07/09/20 0742)   PRN Meds:.acetaminophen **OR** acetaminophen, ondansetron **OR** ondansetron (ZOFRAN) IV, polyethylene glycol, senna-docusate, traMADol    Review of Systems  Unable to perform ROS: Dementia    Physical Exam Vitals reviewed.  Constitutional:      General: He is sleeping. He is not in acute distress.    Comments: Elderly and frail appearing  HENT:     Head: Normocephalic and atraumatic.  Cardiovascular:     Rate and Rhythm: Normal rate.  Pulmonary:     Effort: Pulmonary effort is normal.     Comments: Room air Neurological:     Comments: Oriented to person, place, time     Vital Signs: BP (!) 90/45   Pulse 80   Temp 97.7 F (36.5 C)   Resp 14   Ht _0  (1.702 m)   Wt 51.7 kg   SpO2 94%    BMI 17.85 kg/m  Pain Scale: 0-10   Pain Score: 4    SpO2: SpO2: 94 % O2 Device:SpO2: 94 % O2 Flow Rate: .   LBM: Last BM Date:  (UTA) Baseline Weight: Weight: 54.4 kg Most recent weight: Weight: 51.7 kg      Palliative Assessment/Data: 30%    Time In: 18:35 Time Out: 17:20 Time Total: 75 minutes Greater than 50%  of this time was spent counseling and coordinating care related to the above assessment and plan.  Signed by: Lavena Bullion, NP   Please contact Palliative Medicine Team phone at [phone] for questions and concerns.  For individual provider: See Shea Evans

## 2020-07-09 NOTE — Anesthesia Procedure Notes (Signed)
Procedure Name: LMA Insertion Date/Time: 07/09/2020 9:06 AM Performed by: Wilburn Cornelia, CRNA Pre-anesthesia Checklist: Patient identified, Emergency Drugs available, Suction available, Patient being monitored and Timeout performed Patient Re-evaluated:Patient Re-evaluated prior to induction Oxygen Delivery Method: Circle system utilized Preoxygenation: Pre-oxygenation with 100% oxygen Induction Type: IV induction Ventilation: Mask ventilation without difficulty LMA: LMA inserted LMA Size: 4.0 Number of attempts: 1 Placement Confirmation: positive ETCO2 and breath sounds checked- equal and bilateral Tube secured with: Tape Dental Injury: Teeth and Oropharynx as per pre-operative assessment

## 2020-07-09 NOTE — Anesthesia Preprocedure Evaluation (Addendum)
Anesthesia Evaluation  Patient identified by MRN, date of birth, ID band Patient awake    Reviewed: Allergy & Precautions, NPO status , Patient's Chart, lab work & pertinent test results  History of Anesthesia Complications Negative for: history of anesthetic complications  Airway Mallampati: II  TM Distance: >3 FB Neck ROM: Full    Dental  (+) Dental Advisory Given, Missing, Poor Dentition   Pulmonary neg pulmonary ROS,    Pulmonary exam normal        Cardiovascular hypertension, Pt. on medications and Pt. on home beta blockers +CHF  Normal cardiovascular exam     Neuro/Psych PSYCHIATRIC DISORDERS Anxiety Dementia negative neurological ROS     GI/Hepatic Neg liver ROS, GERD  ,  Endo/Other  Hypothyroidism   Renal/GU CRFRenal disease  negative genitourinary   Musculoskeletal  (+) Arthritis , Osteoarthritis,    Abdominal   Peds negative pediatric ROS (+)  Hematology negative hematology ROS (+)   Anesthesia Other Findings   Reproductive/Obstetrics negative OB ROS                            Anesthesia Physical  Anesthesia Plan  ASA: IV and emergent  Anesthesia Plan: General   Post-op Pain Management:    Induction: Intravenous  PONV Risk Score and Plan: 2 and Ondansetron  Airway Management Planned: Oral ETT and LMA  Additional Equipment:   Intra-op Plan:   Post-operative Plan: Extubation in OR  Informed Consent: I have reviewed the patients History and Physical, chart, labs and discussed the procedure including the risks, benefits and alternatives for the proposed anesthesia with the patient or authorized representative who has indicated his/her understanding and acceptance.     Dental advisory given  Plan Discussed with: CRNA, Anesthesiologist and Surgeon  Anesthesia Plan Comments: (  )       Anesthesia Quick Evaluation

## 2020-07-09 NOTE — Progress Notes (Signed)
Patient ID: Wayne [name], male   DOB: 03/28/1926, 86 y.o.   MRN: [id number] I have attempted to call son [name] twice this am and left a message.  This case is urgent and needs to proceed due to infection.

## 2020-07-10 ENCOUNTER — Encounter (HOSPITAL_COMMUNITY): Payer: Self-pay | Admitting: General Surgery

## 2020-07-10 ENCOUNTER — Other Ambulatory Visit: Payer: Self-pay | Admitting: Urology

## 2020-07-10 DIAGNOSIS — Z7189 Other specified counseling: Secondary | ICD-10-CM

## 2020-07-10 DIAGNOSIS — Z515 Encounter for palliative care: Secondary | ICD-10-CM

## 2020-07-10 LAB — C-REACTIVE PROTEIN: CRP: 14.9 mg/dL — ABNORMAL HIGH (ref <1.0)

## 2020-07-10 LAB — GLUCOSE, CAPILLARY
Glucose-Capillary: 103 mg/dL — ABNORMAL HIGH (ref 70–99)
Glucose-Capillary: 62 mg/dL — ABNORMAL LOW (ref 70–99)
Glucose-Capillary: 70 mg/dL (ref 70–99)
Glucose-Capillary: 71 mg/dL (ref 70–99)
Glucose-Capillary: 71 mg/dL (ref 70–99)
Glucose-Capillary: 71 mg/dL (ref 70–99)
Glucose-Capillary: 73 mg/dL (ref 70–99)

## 2020-07-10 LAB — CBC WITH DIFFERENTIAL/PLATELET
Abs Immature Granulocytes: 0.07 10*3/uL (ref 0.00–0.07)
Basophils Absolute: 0 10*3/uL (ref 0.0–0.1)
Basophils Relative: 0 %
Eosinophils Absolute: 0 10*3/uL (ref 0.0–0.5)
Eosinophils Relative: 1 %
HCT: 23 % — ABNORMAL LOW (ref 39.0–52.0)
Hemoglobin: 7.3 g/dL — ABNORMAL LOW (ref 13.0–17.0)
Immature Granulocytes: 1 %
Lymphocytes Relative: 12 %
Lymphs Abs: 0.8 10*3/uL (ref 0.7–4.0)
MCH: 28.4 pg (ref 26.0–34.0)
MCHC: 31.7 g/dL (ref 30.0–36.0)
MCV: 89.5 fL (ref 80.0–100.0)
Monocytes Absolute: 0.5 10*3/uL (ref 0.1–1.0)
Monocytes Relative: 8 %
Neutro Abs: 4.8 10*3/uL (ref 1.7–7.7)
Neutrophils Relative %: 78 %
Platelets: 208 10*3/uL (ref 150–400)
RBC: 2.57 MIL/uL — ABNORMAL LOW (ref 4.22–5.81)
RDW: 14.6 % (ref 11.5–15.5)
WBC: 6.1 10*3/uL (ref 4.0–10.5)
nRBC: 0 % (ref 0.0–0.2)

## 2020-07-10 LAB — BASIC METABOLIC PANEL
Anion gap: 8 (ref 5–15)
BUN: 66 mg/dL — ABNORMAL HIGH (ref 8–23)
CO2: 17 mmol/L — ABNORMAL LOW (ref 22–32)
Calcium: 8.4 mg/dL — ABNORMAL LOW (ref 8.9–10.3)
Chloride: 119 mmol/L — ABNORMAL HIGH (ref 98–111)
Creatinine, Ser: 1.66 mg/dL — ABNORMAL HIGH (ref 0.61–1.24)
GFR calc Af Amer: 41 mL/min — ABNORMAL LOW (ref >60)
GFR calc non Af Amer: 35 mL/min — ABNORMAL LOW (ref >60)
Glucose, Bld: 79 mg/dL (ref 70–99)
Potassium: 3.7 mmol/L (ref 3.5–5.1)
Sodium: 144 mmol/L (ref 135–145)

## 2020-07-10 LAB — VANCOMYCIN, RANDOM: Vancomycin Rm: 6

## 2020-07-10 LAB — HEPATIC FUNCTION PANEL
ALT: 18 U/L (ref 0–44)
AST: 14 U/L — ABNORMAL LOW (ref 15–41)
Albumin: 2.3 g/dL — ABNORMAL LOW (ref 3.5–5.0)
Alkaline Phosphatase: 92 U/L (ref 38–126)
Bilirubin, Direct: 0.2 mg/dL (ref 0.0–0.2)
Indirect Bilirubin: 0.8 mg/dL (ref 0.3–0.9)
Total Bilirubin: 1 mg/dL (ref 0.3–1.2)
Total Protein: 5.3 g/dL — ABNORMAL LOW (ref 6.5–8.1)

## 2020-07-10 LAB — MAGNESIUM: Magnesium: 2 mg/dL (ref 1.7–2.4)

## 2020-07-10 LAB — MRSA PCR SCREENING: MRSA by PCR: NEGATIVE

## 2020-07-10 MED ORDER — POLYETHYLENE GLYCOL 3350 17 G PO PACK: 17.0000 g | PACK | Freq: Every day | ORAL | Status: DC | PRN

## 2020-07-10 MED ORDER — ORAL CARE MOUTH RINSE
15.0000 mL | Freq: Two times a day (BID) | OROMUCOSAL | Status: DC
  Administered 2020-07-10 – 2020-07-19 (×18): 15 mL via OROMUCOSAL

## 2020-07-10 MED ORDER — FENTANYL CITRATE (PF) 100 MCG/2ML IJ SOLN
INTRAMUSCULAR | Status: AC
  Filled 2020-07-10: qty 2

## 2020-07-10 MED ORDER — VANCOMYCIN HCL 750 MG/150ML IV SOLN
750.0000 mg | INTRAVENOUS | Status: DC
  Administered 2020-07-10 – 2020-07-13 (×4): 750 mg via INTRAVENOUS
  Filled 2020-07-10 (×4): qty 150

## 2020-07-10 MED ORDER — DEXTROSE 50 % IV SOLN
INTRAVENOUS | Status: AC
  Administered 2020-07-10: 25 mL
  Filled 2020-07-10: qty 50

## 2020-07-10 MED ORDER — HEPARIN SODIUM (PORCINE) 5000 UNIT/ML IJ SOLN
5000.0000 [IU] | Freq: Three times a day (TID) | INTRAMUSCULAR | Status: DC
  Administered 2020-07-10 – 2020-07-19 (×25): 5000 [IU] via SUBCUTANEOUS
  Filled 2020-07-10 (×25): qty 1

## 2020-07-10 MED ORDER — FENTANYL CITRATE (PF) 100 MCG/2ML IJ SOLN
25.0000 ug | Freq: Once | INTRAMUSCULAR | Status: AC
  Administered 2020-07-10: 12.5 ug via INTRAVENOUS

## 2020-07-10 MED ORDER — VANCOMYCIN HCL 750 MG/150ML IV SOLN
750.0000 mg | Freq: Once | INTRAVENOUS | Status: DC
  Filled 2020-07-10: qty 150

## 2020-07-10 NOTE — Anesthesia Postprocedure Evaluation (Signed)
Anesthesia Post Note  Patient: Wayne [name]  Procedure(s) Performed: IRRIGATION AND DEBRIDEMENT SCROTUM AND PERINEUM (N/A Perineum) DEBRIDEMENT OF SCROTUM (N/A Perineum)     Patient location during evaluation: PACU Anesthesia Type: General Level of consciousness: awake and alert Pain management: pain level controlled Vital Signs Assessment: post-procedure vital signs reviewed and stable Respiratory status: spontaneous breathing, nonlabored ventilation, respiratory function stable and patient connected to nasal cannula oxygen Cardiovascular status: blood pressure returned to baseline and stable Postop Assessment: no apparent nausea or vomiting Anesthetic complications: no   No complications documented.  Last Vitals:  Vitals:   07/10/20 1900 07/10/20 2028  BP: (!) 94/45   Pulse: 78   Resp: 18   Temp:  (!) 36.3 C  SpO2: 96%     Last Pain:  Vitals:   07/10/20 2028  TempSrc: Oral  PainSc:                  Evah Rashid

## 2020-07-10 NOTE — Evaluation (Signed)
Clinical/Bedside Swallow Evaluation Patient Details  Name: Wayne [name] MRN: [id number] Date of Birth: August 08, 1926  Today's Date: 07/10/2020 Time: SLP Start Time (ACUTE ONLY): 42 SLP Stop Time (ACUTE ONLY): 1102 SLP Time Calculation (min) (ACUTE ONLY): 26 min  Past Medical History:  Past Medical History:  Diagnosis Date  . Anxiety    takes Atrivan daily  . Back pain    chronic with neck pain  . Cancer ([place])    skin  . CHF (congestive heart failure) ([place])   . Constipation    related to medication  . Dementia ([place])    takes Aricept nightly  . Enlarged prostate    self caths once every 1-2wks  . GERD (gastroesophageal reflux disease)    doesn't require medication  . Hx of seasonal allergies   . Hyperlipidemia    takes Simvastatin daily  . Hypertension    takes atenolol daily  . Hypothyroidism    takes Synthroid daily  . Insomnia    related to pain  . MRSA (methicillin resistant staph aureus) culture positive    Per patient tested on 10/07/11.  . Osteoporosis   . Peripheral edema    takes Torsemide daily  . Scoliosis    Past Surgical History:  Past Surgical History:  Procedure Laterality Date  . abdominal cyst     removed-2012;MRSA done by Dr.Gross  . CATARACT EXTRACTION    . cataracts     bilateral  . CERVICAL DISC SURGERY     x1  . COLONOSCOPY    . ESOPHAGOGASTRODUODENOSCOPY    . HERNIA REPAIR     Right inguinal  . INCISION AND DRAINAGE OF WOUND N/A 07/09/2020   Procedure: IRRIGATION AND DEBRIDEMENT SCROTUM AND PERINEUM;  Surgeon: Rolm Bookbinder, MD;  Location: [facility];  Service: General;  Laterality: N/A;  . INCISION AND DRAINAGE OF WOUND N/A 07/09/2020   Procedure: DEBRIDEMENT OF SCROTUM;  Surgeon: Robley Fries, MD;  Location: [facility];  Service: Urology;  Laterality: N/A;  . INTRAMEDULLARY (IM) NAIL INTERTROCHANTERIC Left 10/12/2018  . INTRAMEDULLARY (IM) NAIL INTERTROCHANTERIC Left 10/12/2018   Procedure: INTRAMEDULLARY (IM) NAIL INTERTROCHANTRIC;   Surgeon: Renette Butters, MD;  Location: [facility];  Service: Orthopedics;  Laterality: Left;  . LUMBAR DISC SURGERY     x2  . TOE SURGERY     right great toe  . TRANSURETHRAL RESECTION OF PROSTATE     15+yrs ago   HPI:  Wayne [name] is a 86 y.o. male with medical history significant of anxiety, dementia, chronic back and neck pain, unspecified skin cancer, chronic diastolic CHF, constipation, BPH, GERD, hyperlipidemia, hypertension, hypothyroidism, peripheral edema, scoliosis, admitted with scrotal edema and erythema. Underwent debridement due to severe perineal infection (necrotizing sof tissue infection) with postoperative shock. Suspected to have chronic aspiration. Palliative care meeting with son 9/28. CXR No acute disease   Assessment / Plan / Recommendation Clinical Impression  Highly suspect pt has an acute on chronic dysphagia that he is unable to compensate for given present decline in medical status. Son reports nursing has noted an cough x 1 month not necessarily related to po's. Son states his appetite has decreased and he has lost weight. At baseline voice is significantly wet and pharyngeal congestion audible at rest. He cannot mobilize mucous/secretions fully to oral cavity. Immediate and delayed throat clear with thin and nectar and continues with wet vocal quality. Subjectively swallow is weak. Son present and educated him and pt re: recommendations as well as possible outcomes  from assessment- he is being followed by Palliative care. If he aspirates all consistencies, options include initiating head positions, thickened liquids or allowing pt to have what he desires with education of risks for quality of life (was discussed with pt and son). Plan for MBS tomorrow at 9:00. Would discontinue the clear liquid diet and allow only small sips water and ice chips until MBS.       SLP Visit Diagnosis: Dysphagia, unspecified (R13.10)    Aspiration Risk  Moderate aspiration risk;Severe  aspiration risk    Diet Recommendation Free water protocol after oral care;Ice chips PRN after oral care   Liquid Administration via: Cup Medication Administration:  (no vital meds per Therapist, sports) Supervision: Staff to assist with self feeding Compensations: Slow rate;Small sips/bites Postural Changes: Seated upright at 90 degrees    Other  Recommendations Oral Care Recommendations: Oral care QID   Follow up Recommendations Skilled Nursing facility      Frequency and Duration min 2x/week  2 weeks       Prognosis Prognosis for Safe Diet Advancement: Fair Barriers to Reach Goals: Cognitive deficits      Swallow Study   General HPI: Wayne [name] is a 86 y.o. male with medical history significant of anxiety, dementia, chronic back and neck pain, unspecified skin cancer, chronic diastolic CHF, constipation, BPH, GERD, hyperlipidemia, hypertension, hypothyroidism, peripheral edema, scoliosis, admitted with scrotal edema and erythema. Underwent debridement due to severe perineal infection (necrotizing sof tissue infection) with postoperative shock. Suspected to have chronic aspiration. Palliative care meeting with son 9/28. CXR No acute disease Type of Study: Bedside Swallow Evaluation Previous Swallow Assessment:  (none) Diet Prior to this Study: Thin liquids;Other (Comment) (clear liquids) Temperature Spikes Noted: No Respiratory Status: Room air History of Recent Intubation: No Behavior/Cognition: Alert;Cooperative;Pleasant mood Oral Cavity Assessment: Dry Oral Care Completed by SLP: Yes Oral Cavity - Dentition: Missing dentition (scattered) Vision: Functional for self-feeding Self-Feeding Abilities: Needs assist Patient Positioning: Upright in bed Baseline Vocal Quality: Wet Volitional Cough: Weak Volitional Swallow: Able to elicit    Oral/Motor/Sensory Function Overall Oral Motor/Sensory Function: Within functional limits   Ice Chips Ice chips:  (no s/s exhibited) Presentation:  Spoon   Thin Liquid Thin Liquid: Impaired Presentation: Cup;Straw Pharyngeal  Phase Impairments: Wet Vocal Quality;Cough - Immediate    Nectar Thick Nectar Thick Liquid: Not tested   Honey Thick Honey Thick Liquid: Not tested   Puree Puree: Impaired Presentation: Spoon Pharyngeal Phase Impairments: Throat Clearing - Delayed   Solid     Solid: Not tested      Wayne [name] 07/10/2020,11:30 AM Wayne [name].Ed Risk analyst [phone] Office [phone]

## 2020-07-10 NOTE — Progress Notes (Addendum)
Pharmacy Antibiotic Note  Wayne [name] is a 86 y.o. male admitted on 07/08/2020 with cellulitis.  Pharmacy has been consulted for cefepime and vancomycin dosing.   Patient remains afebrile with WBC down to 6.1 and lactic acid down to 1.6. Since admission, patient's renal function has improved with Scr down to 1.66 with CrCl ~21. Now growing rare gram positive cocci in pairs and clusters on abscess cultures. Blood cultures remain negative to date. There appears to be no further surgery or debridement recommended at this time.   Plan to continue current antibiotic regimen for now pending final cultures. Patient has received 1 dose of vancomycin in the ED (9/27). A vanc random level drawn this afternoon returned low at 6. Based on patient's changes in renal function and low weight, will begin dosing vancomycin daily at a reduced dose and follow renal function closely.  Plan:  Cefepime 2g IV every 24 hours Vancomycin 750 mg IV q24h Obtain levels as appropriate Monitor renal function and UOP closely Follow up with cultures, antibiotic de-escalation and LOT   Height: 5\' 7"  (170.2 cm) Weight: 51.7 kg (114 lb) IBW/kg (Calculated) : 66.1  Temp (24hrs), Avg:97.6 F (36.4 C), Min:97.3 F (36.3 C), Max:97.8 F (36.6 C)  Recent Labs  Lab 07/08/20 1519 07/08/20 1528 07/08/20 1935 07/09/20 0412 07/10/20 0039 07/10/20 0227 07/10/20 1205  WBC  --  13.9*  --  12.0* 6.1  --   --   CREATININE  --  3.23*  --   --   --  1.66*  --   LATICACIDVEN 2.0*  --  1.6  --   --   --   --   VANCORANDOM  --   --   --   --   --   --  6    Estimated Creatinine Clearance: 21.2 mL/min (A) (by C-G formula based on SCr of 1.66 mg/dL (H)).    Allergies  Allergen Reactions  . Doxazosin Mesylate Other (See Comments)    Makes blood pressure  . Propoxyphene Hcl Other (See Comments)    REACTION: upset stomach  . Penicillins Rash    DID THE REACTION INVOLVE: Swelling of the face/tongue/throat, SOB, or low BP?  No Sudden or severe rash/hives, skin peeling, or the inside of the mouth or nose? Yes Did it require medical treatment? Yes When did it last happen?50 yrs ago If all above answers are "NO", may proceed with cephalosporin use.     Antimicrobials this admission: Cefepime 9/27 >> Vancomycin 9/27>> Flagyl 9/27>>  Microbiology results: 9/28 Abscess - rare GPC in pairs and clusters 9/27 BCx x2 - ngtd  9/27 COVID/Flu - negative  Thank you for allowing pharmacy to be a part of this patient's care.  Claudina Lick, PharmD PGY1 Acute Care Pharmacy Resident 07/10/2020 3:46 PM  Please check [email or web address] for unit-specific pharmacy phone numbers.

## 2020-07-10 NOTE — Progress Notes (Signed)
Patient chart reviewed and discussed with Dr. Donne Hazel.  Dr. Donne Hazel changed dressing and pictures are in chart.  No significant residual infection noted.  There is fibrinous exudate around testes but does not need debridement.  Will cancel plans to return to OR tomorrow.  Dressing to be change by Urology tomorrow AM.  Patient clinically improving.  Left VM with patient son to discuss plan.

## 2020-07-10 NOTE — Progress Notes (Signed)
1 Day Post-Op   Subjective/Chief Complaint: Events since surgery noted, minimal communication this am   Objective: Vital signs in last 24 hours: Temp:  [97.3 F (36.3 C)-97.8 F (36.6 C)] 97.8 F (36.6 C) (09/29 0800) Pulse Rate:  [58-92] 74 (09/29 0800) Resp:  [11-26] 19 (09/29 0800) BP: (77-154)/(41-83) 101/54 (09/29 0800) SpO2:  [84 %-100 %] 94 % (09/29 0800) Last BM Date:  (PTA)  Intake/Output from previous day: 09/28 0701 - 09/29 0700 In: 4222.2 [I.V.:3627.1; IV Piggyback:595.1] Out: 1425 [Urine:1425] Intake/Output this shift: Total I/O In: 80.8 [IV Piggyback:80.8] Out: -   wound without any active infection present, some exudate, see pictures  Lab Results:  Recent Labs    07/09/20 0412 07/10/20 0039  WBC 12.0* 6.1  HGB 8.7* 7.3*  HCT 27.6* 23.0*  PLT 281 208   BMET Recent Labs    07/08/20 1528 07/10/20 0227  NA 134* 144  K 5.2* 3.7  CL 106 119*  CO2 16* 17*  GLUCOSE 114* 79  BUN 126* 66*  CREATININE 3.23* 1.66*  CALCIUM 8.9 8.4*   PT/INR Recent Labs    07/08/20 1528 07/09/20 0412  LABPROT 21.0* 21.3*  INR 1.9* 1.9*   ABG No results for input(s): PHART, HCO3 in the last 72 hours.  Invalid input(s): PCO2, PO2  Studies/Results: CT ABDOMEN PELVIS WO CONTRAST  Result Date: 07/08/2020 CLINICAL DATA:  Concern for scrotal abscess/Fournier's gangrene EXAM: CT ABDOMEN AND PELVIS WITHOUT CONTRAST TECHNIQUE: Multidetector CT imaging of the abdomen and pelvis was performed following the standard protocol without IV contrast. COMPARISON:  CT L-spine 07/20/2011, chest radiograph 07/08/2020 FINDINGS: Lower chest: Chronic appearing chest wall deformity is noted likely related to severe scoliotic curvature. There are bandlike and consolidative opacities in the bilateral lung bases centered upon thickened lower lobe airways bilaterally which could reflect some atelectatic change and/or scarring though sequela of aspiration or infection could have a similar  appearance. Normal heart size. No pericardial effusion. Three-vessel coronary artery atherosclerosis is noted. Hypoattenuation of the cardiac blood pool rib likely reflecting some mild anemia. Atherosclerotic calcification in the visible portions of the thoracic aorta. Hepatobiliary: Incompletely characterized peripherally hypoattenuating, centrally isoattenuating lesion in the inferior right lobe liver measuring approximately 3.8 x 4.8 x 5.2 cm (6/54, 3/24). No other focal concerning liver lesions. Gallbladder distention is top-normal. Partially calcified gallstone at the gallbladder neck. No pericholecystic fluid or inflammation. No biliary ductal dilatation or intraductal gallstones. Pancreas: Partial fatty replacement of the pancreas. No pancreatic ductal dilatation or surrounding inflammatory changes. Spleen: Normal in size. No concerning splenic lesions. Adrenals/Urinary Tract: Normal adrenal glands. Kidneys are symmetric in size and normally located. There are scattered subcentimeter hypoattenuating foci in both kidneys too small to fully characterize on CT imaging but statistically likely benign. Hyperdense renal pyramids left, a nonspecific finding but can be seen urinary obstruction, metabolic derangement or papillary necrosis among other etiologies. There is moderate bilateral hydroureteronephrosis without obstructing calculus. Urinary bladder is circumferentially thickened with numerous bladder diverticula. Stomach/Bowel: Distal esophagus, stomach and duodenal sweep are unremarkable. No small bowel wall thickening or dilatation. No evidence of obstruction. A normal appendix is visualized. Interposition of the hepatic flexure anterior to the liver. There is a large inspissated rectal stool ball with circumferential rectal wall thickening and inflammation as well as stranding in the presacral space. Marked thickening is also seen along the levator plate. Feculent material noted within the gluteal cleft.  Vascular/Lymphatic: Atherosclerotic calcifications within the abdominal aorta and branch vessels. Marked tortuosity of the  abdominal aorta without frank aneurysm or ectasia. No enlarged abdominopelvic lymph nodes. Reproductive: Nyoka Lint defect in the region of the prostate, could reflect prior prostate intervention. Some mild thickening and heterogeneous enhancement of the prostate could reflect a concomitant prostatitis. Seminal vesicles are poorly visualized. Other: There is extensive thickening along the levator plate. Soft tissue thickening and subcutaneous phlegmon noted along the left gluteal cleft with overlying skin thickening. There is a multilobulated, thick-walled fluid collection extending from this region of phlegmon anteriorly to the base of the penile corpora the inferior extent of these collections and pudendal surfaces incompletely included within the margins of imaging. No soft tissue gas is seen though a necrotizing infection is not fully excluded on an imaging basis. Presacral fat stranding and perirectal thickening, as detailed above. Mild circumferential body wall edema. Neurostimulator battery pack in the soft tissues of the right flank entering the canal at the T11 level. Terminating above the level of imaging. Musculoskeletal: The osseous structures appear diffusely demineralized which may limit detection of small or nondisplaced fractures. No acute or suspicious osseous lesions are evident. Severe dextrocurvature of the spine. Bony fusion across the L1-L4 levels with severe dextrocurvature at this site. Associated pelvic tilt is noted as well. There is a grade 2 anterolisthesis L5 on S1 and 7 mm of retrolisthesis L4 on L5. Bilateral L5 pars defects are noted. Severe resulting foraminal stenosis at this level. Additional moderate canal impingement C3-4, C4-5. Few scattered benign bone islands. Prior left femoral intramedullary nail and transcervical pin placement traversing a healed deformity  of a left intertrochanteric femur fracture. IMPRESSION: 1. Soft tissue thickening and subcutaneous phlegmon along the left gluteal cleft with overlying skin thickening. There is a multilobulated, thick-walled fluid collection extending from this region of phlegmon anteriorly to the base of the penile corpora. The inferior extent of these collections and pudendal surface is incompletely included within the margins of imaging. Associated thickening of the levator plate. A perianal abscess or fistula is not completely excluded. No soft tissue gas is seen though a necrotizing infection is not fully excluded on an imaging basis. Recommend correlation with direct visualization. 2. Large inspissated rectal stool ball with circumferential rectal wall thickening and inflammation as well as stranding in the presacral space. Findings are concerning for stercoral colitis. 3. Moderate bilateral hydroureteronephrosis without obstructing calculus. Circumferentially thickened urinary bladder with numerous bladder diverticula, suggestive of chronic outlet obstruction with possible superimposed cystitis. Recommend correlation with urinalysis. 4. Keyhole defect in the prostate may reflect prior prostate resection. Heterogeneous appearance of the prostate with adjacent stranding, could reflect concomitant prostatitis. 5. Hyperdense left renal pyramids , a nonspecific finding but can be seen urinary obstruction, metabolic derangement or papillary necrosis among other etiologies. 6. Indeterminate heterogeneous lesion in the inferior right lobe liver measuring approximately 3.8 x 4.8 x 5.2 cm, incompletely characterized on noncontrast CT. Recommend further evaluation with nonemergent liver protocol CT or MRI. 7. Cholelithiasis without evidence of acute cholecystitis. 8. Severe dextrocurvature of the spine across fused L1-L4 levels. Spondylolysis and spondylolisthesis as described above. Severe bilateral foraminal narrowing L5-S1. Moderate  canal narrowing L3-4, L4-5. 9. Aortic Atherosclerosis (ICD10-I70.0). These results were called by telephone at the time of interpretation on 07/08/2020 at 8:26 pm to provider DAVID YAO , who verbally acknowledged these results. Electronically Signed   By: Lovena Le M.D.   On: 07/08/2020 20:22   DG Chest 2 View  Result Date: 07/08/2020 CLINICAL DATA:  Cough for 2 weeks. EXAM: CHEST - 2 VIEW COMPARISON:  Single-view of the chest 10/11/2018. FINDINGS: The lungs are clear. Heart size is normal. Aortic atherosclerosis. No pneumothorax or pleural effusion. Cervical fusion hardware is unchanged. Inferior most screw on the left has partially backed out. Spinal stimulator noted. IMPRESSION: No acute disease. Aortic Atherosclerosis (ICD10-I70.0). Electronically Signed   By: Inge Rise M.D.   On: 07/08/2020 14:57   US Scrotum  Result Date: 07/08/2020 CLINICAL DATA:  86 year old with scrotal swelling. Rule out scrotal abscess. EXAM: ULTRASOUND OF SCROTUM TECHNIQUE: Complete ultrasound examination of the testicles, epididymis, and other scrotal structures was performed. COMPARISON:  Included portions from abdominopelvic CT earlier today. FINDINGS: Right testicle Measurements: 3.1 x 0.9 x 2.2 cm. No mass or microlithiasis visualized. Blood flow is noted. No intratesticular collection. There are extra testicular foci in the scrotum with posterior shadowing measuring up to 6 mm. No dirty shadowing to suggest air. Left testicle Measurements: 3.5 x 2.1 x 2.3 cm. No mass or microlithiasis visualized. Blood flow is noted. No intratesticular collection. There are extratesticular foci in the scrotum with posterior shadowing measuring up to 8 mm. No dirty shadowing to suggest air. Right epididymis:  Normal in size and appearance. Left epididymis:  Normal in size and appearance. Hydrocele:  Small on the right.  Fluid appears simple. Varicocele:  None visualized. Other: Diffuse bilateral skin and soft tissue thickening and  edema measuring up to 2 cm. No discrete scrotal fluid collection. IMPRESSION: 1. Diffuse scrotal skin and soft tissue thickening measuring up to 2 cm typical cellulitis. No discrete scrotal fluid collection. 2. Small right hydrocele. 3. Extra testicular foci with posterior shadowing measuring up to 6 mm on the right and 8 mm on the left. These are most typical of calcifications and likely postinflammatory. 4. Please note the area of phlegmonous change in the perineum on CT was not evaluated on this scrotal ultrasound. Electronically Signed   By: Keith Rake M.D.   On: 07/08/2020 20:43    Anti-infectives: Anti-infectives (From admission, onward)   Start     Dose/Rate Route Frequency Ordered Stop   07/09/20 2000  ceFEPIme (MAXIPIME) 2 g in sodium chloride 0.9 % 100 mL IVPB        2 g 200 mL/hr over 30 Minutes Intravenous Every 24 hours 07/08/20 1955     07/09/20 0600  metroNIDAZOLE (FLAGYL) IVPB 500 mg        500 mg 100 mL/hr over 60 Minutes Intravenous Every 8 hours 07/09/20 0332     07/08/20 2309  vancomycin variable dose per unstable renal function (pharmacist dosing)         Does not apply See admin instructions 07/08/20 2309     07/08/20 2215  metroNIDAZOLE (FLAGYL) IVPB 500 mg        500 mg 100 mL/hr over 60 Minutes Intravenous  Once 07/08/20 2207 07/08/20 2327   07/08/20 1945  ceFEPIme (MAXIPIME) 2 g in sodium chloride 0.9 % 100 mL IVPB        2 g 200 mL/hr over 30 Minutes Intravenous  Once 07/08/20 1933 07/08/20 2033   07/08/20 1900  vancomycin (VANCOCIN) IVPB 1000 mg/200 mL premix        1,000 mg 200 mL/hr over 60 Minutes Intravenous  Once 07/08/20 1853 07/08/20 2220      Assessment/Plan: STI scrotum -dressing changed this am I dont think needs more surgery from my standpoint -cont abx -or per urology -cont dressing changes   Rolm Bookbinder 07/10/2020

## 2020-07-10 NOTE — Progress Notes (Signed)
NAME:  Wayne [name], MRN:  [id number], DOB:  1926/05/05, LOS: 2 ADMISSION DATE:  07/08/2020, CONSULTATION DATE:  07/09/20 REFERRING MD:  Starla Link, CHIEF COMPLAINT:  Pain   Brief History   86 year old man with hx of dementia, SNF resident presenting with severe perineal infection post debridement now in postoperative shock.  History of present illness   86 year old man with hx of dementia, SNF resident presenting with severe perineal infection post debridement now in postoperative shock.  History mostly per chart review, patient is oriented but unable to answer detailed questions.  Per chart review sent to ER for worsening scrotal pain.   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 Claudia Desanctis debrided him this morning.  In PACU, hypotensive despite emergence from anesthesia and supplemental fluid. PCCM asked to bring to ICU for closer observation.  Past Medical History  Dementia Protein calorie malnutrition GERD Constipation Diastolic heart failure  Significant Hospital Events   9/28 admitted   Consults:  Urology Surgery  Procedures:  9/28 Exploration, washout and debridement of necrotic scrotum  Significant Diagnostic Tests:  CT A/P IMPRESSION: 1. Soft tissue thickening and subcutaneous phlegmon along the left gluteal cleft with overlying skin thickening. There is a multilobulated, thick-walled fluid collection extending from this region of phlegmon anteriorly to the base of the penile corpora. The inferior extent of these collections and pudendal surface is incompletely included within the margins of imaging. Associated thickening of the levator plate. A perianal abscess or fistula is not completely excluded. No soft tissue gas is seen though a necrotizing infection is not fully excluded on an imaging basis. Recommend correlation with direct visualization. 2. Large inspissated rectal stool ball with circumferential rectal wall thickening and  inflammation as well as stranding in the presacral space. Findings are concerning for stercoral colitis. 3. Moderate bilateral hydroureteronephrosis without obstructing calculus. Circumferentially thickened urinary bladder with numerous bladder diverticula, suggestive of chronic outlet obstruction with possible superimposed cystitis. Recommend correlation with urinalysis. 4. Keyhole defect in the prostate may reflect prior prostate resection. Heterogeneous appearance of the prostate with adjacent stranding, could reflect concomitant prostatitis. 5. Hyperdense left renal pyramids , a nonspecific finding but can be seen urinary obstruction, metabolic derangement or papillary necrosis among other etiologies. 6. Indeterminate heterogeneous lesion in the inferior right lobe liver measuring approximately 3.8 x 4.8 x 5.2 cm, incompletely characterized on noncontrast CT. Recommend further evaluation with nonemergent liver protocol CT or MRI. 7. Cholelithiasis without evidence of acute cholecystitis. 8. Severe dextrocurvature of the spine across fused L1-L4 levels. Spondylolysis and spondylolisthesis as described above. Severe bilateral foraminal narrowing L5-S1. Moderate canal narrowing L3-4, L4-5. 9. Aortic Atherosclerosis (ICD10-I70.0).  Micro Data:  Urine cx>> Wound cx>> Prior MRSA positive  Antimicrobials:  Vanc>> Cefepime>> Flagyl>>   Interim history/subjective:  No acute overnight events.  Creatinine downtrending. Producing decent UOP. Stopped NS and LR  Prelim aerobic/anaerobic cultures showing rate GPC in clusters. Continue coverage with vanc/cefepime/flagyl until cultures finalize.  If stable, may be able to transfer out of ICU today.  Objective   Blood pressure (!) 101/54, pulse 74, temperature 97.8 F (36.6 C), temperature source Oral, resp. rate 19, height 5\' 7"  (1.702 m), weight 51.7 kg, SpO2 94 %.        Intake/Output Summary (Last 24 hours) at 07/10/2020  1244 Last data filed at 07/10/2020 0800 Gross per 24 hour  Intake 3803 ml  Output 1425 ml  Net 2378 ml   [employer]  07/08/20 1412 07/08/20 1900  Weight: 54.4 kg 51.7 kg    Examination: General: chronically ill, cachectic, elderly male lying in bed, NAD. HENT: mucous membrane very dry, poor dentition. CV: normal rate, intermittently alternating between NSR and irregular rhythm. No m/r/g Pulm: diminished breath sounds bilaterally, but no overt adventitious sounds noted. Satting well on room air. Abdomen: soft, non-tender, non-distended, +BS. Skin: intact, no rashes noted. Scrotum wrapped in dressing, c/d/i. Neuro: AAOx3, following commands, conversing appropriately.   Resolved Hospital Problem list   N/A  Assessment & Plan:  Hypovolemic and septic shock exacerbated by debridement.  Source is both obstructive UTI and necrotizing soft tissue infection of scrotum/perineum. Clinical picture improving, may be able to transfer out of ICU today. - Stopped NS, albumin, and LR infusions. - Continue broad spectrum abx until culture data finalizes - off pressors - can transfer out of ICU today if stable - MRSA nasopharyngeal swab pending - blood cultures pending  Dementia, FTT, weight loss, anorexia - chronic issues - Palliative care following - SLP suspecting chronic aspiration. Will give ice chips today, and perform barium swallow tomorrow. - Discussed with family it might be time to start thinking about hospice, they are open to talking with palliative care regarding  Acute renal failure- combination obstructive and pre-renal due to poor PO - Continue foley - avoid nephrotoxins - creatinine downtrending (3.23 --> 1.66)   Best practice:  Diet: NPO, can do ice chips for comfort Pain/Anxiety/Delirium protocol (if indicated): N/A VAP protocol (if indicated): N/A DVT prophylaxis: SCDs, heparin ppx GI prophylaxis: N/A Glucose control: SSI Mobility: BR Code Status:  full Family Communication: updated son Disposition: ICU     Labs   CBC: Recent Labs  Lab 07/08/20 1528 07/09/20 0412 07/10/20 0039  WBC 13.9* 12.0* 6.1  NEUTROABS 12.6* 9.6* 4.8  HGB 10.6* 8.7* 7.3*  HCT 33.3* 27.6* 23.0*  MCV 89.8 90.2 89.5  PLT 380 281 161    Basic Metabolic Panel: Recent Labs  Lab 07/08/20 1528 07/10/20 0227  NA 134* 144  K 5.2* 3.7  CL 106 119*  CO2 16* 17*  GLUCOSE 114* 79  BUN 126* 66*  CREATININE 3.23* 1.66*  CALCIUM 8.9 8.4*  MG  --  2.0   GFR: Estimated Creatinine Clearance: 21.2 mL/min (A) (by C-G formula based on SCr of 1.66 mg/dL (H)). Recent Labs  Lab 07/08/20 1519 07/08/20 1528 07/08/20 1935 07/09/20 0412 07/10/20 0039  WBC  --  13.9*  --  12.0* 6.1  LATICACIDVEN 2.0*  --  1.6  --   --     Liver Function Tests: Recent Labs  Lab 07/08/20 1528 07/09/20 0412 07/10/20 0227  AST 44* 23 14*  ALT 45* 33 18  ALKPHOS 213* 155* 92  BILITOT 0.6 0.7 1.0  PROT 6.6 5.8* 5.3*  ALBUMIN 2.0* 1.7* 2.3*   No results for input(s): LIPASE, AMYLASE in the last 168 hours. No results for input(s): AMMONIA in the last 168 hours.  ABG No results found for: PHART, PCO2ART, PO2ART, HCO3, TCO2, ACIDBASEDEF, O2SAT   Coagulation Profile: Recent Labs  Lab 07/08/20 1528 07/09/20 0412  INR 1.9* 1.9*    Cardiac Enzymes: No results for input(s): CKTOTAL, CKMB, CKMBINDEX, TROPONINI in the last 168 hours.  HbA1C: Hgb A1c MFr Bld  Date/Time Value Ref Range Status  07/09/2020 04:12 AM 5.2 4.8 - 5.6 % Final    Comment:    (NOTE) Pre diabetes:          5.7%-6.4%  Diabetes:              >  6.4%  Glycemic control for   <7.0% adults with diabetes     CBG: Recent Labs  Lab 07/09/20 1946 07/10/20 0048 07/10/20 0226 07/10/20 0339 07/10/20 0813  GLUCAP 80 71 73 70 71    Review of Systems:   Limited due to dementia but he denies current pain or breathlessness  Past Medical History  He,  has a past medical history of Anxiety, Back  pain, Cancer ([place]), CHF (congestive heart failure) ([place]), Constipation, Dementia ([place]), Enlarged prostate, GERD (gastroesophageal reflux disease), seasonal allergies, Hyperlipidemia, Hypertension, Hypothyroidism, Insomnia, MRSA (methicillin resistant staph aureus) culture positive, Osteoporosis, Peripheral edema, and Scoliosis.   Surgical History    Past Surgical History:  Procedure Laterality Date   abdominal cyst     removed-2012;MRSA done by Dr.Gross   CATARACT EXTRACTION     cataracts     bilateral   CERVICAL DISC SURGERY     x1   COLONOSCOPY     ESOPHAGOGASTRODUODENOSCOPY     HERNIA REPAIR     Right inguinal   INCISION AND DRAINAGE OF WOUND N/A 07/09/2020   Procedure: IRRIGATION AND DEBRIDEMENT SCROTUM AND PERINEUM;  Surgeon: Rolm Bookbinder, MD;  Location: [facility];  Service: General;  Laterality: N/A;   INCISION AND DRAINAGE OF WOUND N/A 07/09/2020   Procedure: DEBRIDEMENT OF SCROTUM;  Surgeon: Robley Fries, MD;  Location: [facility];  Service: Urology;  Laterality: N/A;   INTRAMEDULLARY (IM) NAIL INTERTROCHANTERIC Left 10/12/2018   INTRAMEDULLARY (IM) NAIL INTERTROCHANTERIC Left 10/12/2018   Procedure: INTRAMEDULLARY (IM) NAIL INTERTROCHANTRIC;  Surgeon: Renette Butters, MD;  Location: [facility];  Service: Orthopedics;  Laterality: Left;   LUMBAR DISC SURGERY     x2   TOE SURGERY     right great toe   TRANSURETHRAL RESECTION OF PROSTATE     15+yrs ago     Social History   reports that he has never smoked. He has never used smokeless tobacco. He reports that he does not drink alcohol and does not use drugs.   Family History   His family history includes Diabetes in an other family member; Heart disease (age of onset: 81) in his mother; Hypertension in an other family member; Stroke in his father. There is no history of Anesthesia problems, Hypotension, Malignant hyperthermia, or Pseudochol deficiency.   Allergies Allergies  Allergen Reactions   Doxazosin  Mesylate Other (See Comments)    Makes blood pressure   Propoxyphene Hcl Other (See Comments)    REACTION: upset stomach   Penicillins Rash    DID THE REACTION INVOLVE: Swelling of the face/tongue/throat, SOB, or low BP? No Sudden or severe rash/hives, skin peeling, or the inside of the mouth or nose? Yes Did it require medical treatment? Yes When did it last happen?50 yrs ago If all above answers are NO, may proceed with cephalosporin use.      Home Medications  Prior to Admission medications   Medication Sig Start Date End Date Taking? Authorizing Provider  acetaminophen (TYLENOL) 325 MG tablet Take 650 mg by mouth in the morning, at noon, and at bedtime.   Yes [provider]  ELIQUIS 2.5 MG TABS tablet Take 2.5 mg by mouth 2 (two) times daily. 06/25/20  Yes [provider]  Infant Care Products (DERMACLOUD) CREA Apply 1 application topically in the morning, at noon, and at bedtime.   Yes [provider]  levofloxacin (LEVAQUIN) 500 MG tablet Take 500 mg by mouth daily. 07/06/20  Yes [provider]  levothyroxine (  SYNTHROID) 25 MCG tablet Take 50 mcg by mouth daily before breakfast.  06/29/20  Yes [provider]  magic mouthwash SOLN Take 5 mLs by mouth in the morning and at bedtime.   Yes [provider]  mirtazapine (REMERON) 15 MG tablet Take 15 mg by mouth at bedtime. 06/11/20  Yes [provider]  Pollen Extracts (PROSTAT PO) Take 30 mLs by mouth in the morning and at bedtime.   Yes [provider]  polyethylene glycol (MIRALAX / GLYCOLAX) packet Take 17 g by mouth daily as needed for mild constipation. 10/15/18  Yes Shelly Coss, MD  senna-docusate (SENOKOT-S) 8.6-50 MG tablet Take 1 tablet by mouth at bedtime as needed for mild constipation. 10/15/18  Yes Shelly Coss, MD  sertraline (ZOLOFT) 25 MG tablet Take 75 mg by mouth daily.  06/25/20  Yes [provider]  simvastatin (ZOCOR) 40 MG  tablet TAKE 1 TABLET BY MOUTH EVERY DAY AT NIGHT 06/08/19  Yes Isaac Bliss, Rayford Halsted, MD  torsemide (DEMADEX) 20 MG tablet TAKE 1 TABLET BY MOUTH EVERY DAY 07/25/19  Yes Isaac Bliss, Rayford Halsted, MD  traMADol (ULTRAM) 50 MG tablet Take 50 mg by mouth 3 (three) times daily as needed for moderate pain or severe pain.  07/08/20  Yes [provider]  atenolol (TENORMIN) 50 MG tablet TAKE 1 TABLET BY MOUTH EVERY DAY Patient not taking: Reported on 07/08/2020 07/11/19   Isaac Bliss, Rayford Halsted, MD  donepezil (ARICEPT) 5 MG tablet TAKE 1 TABLET BY MOUTH EVERYDAY AT BEDTIME Patient not taking: Reported on 07/08/2020 07/11/19   Isaac Bliss, Rayford Halsted, MD  levothyroxine (SYNTHROID) 50 MCG tablet TAKE 1 TABLET BY MOUTH DAILY BEFORE BREAKFAST Patient not taking: Reported on 07/08/2020 09/27/19   Isaac Bliss, Rayford Halsted, MD  LORazepam (ATIVAN) 0.5 MG tablet Take 1 tablet (0.5 mg total) by mouth at bedtime as needed for sleep. Patient not taking: Reported on 07/08/2020 02/09/19   Isaac Bliss, Rayford Halsted, MD     Critical care time: 34 minutes

## 2020-07-11 ENCOUNTER — Encounter (HOSPITAL_COMMUNITY)
Admission: EM | Disposition: A | Discharge status: Skilled Nursing Facility | Payer: Self-pay | Source: Home / Self Care | Attending: Internal Medicine

## 2020-07-11 ENCOUNTER — Inpatient Hospital Stay (HOSPITAL_COMMUNITY): Payer: Medicare Other

## 2020-07-11 ENCOUNTER — Other Ambulatory Visit: Payer: Self-pay

## 2020-07-11 DIAGNOSIS — Z515 Encounter for palliative care: Secondary | ICD-10-CM

## 2020-07-11 DIAGNOSIS — N492 Inflammatory disorders of scrotum: Secondary | ICD-10-CM | POA: Diagnosis not present

## 2020-07-11 DIAGNOSIS — F0390 Unspecified dementia without behavioral disturbance: Secondary | ICD-10-CM | POA: Diagnosis not present

## 2020-07-11 DIAGNOSIS — Z66 Do not resuscitate: Secondary | ICD-10-CM | POA: Diagnosis not present

## 2020-07-11 DIAGNOSIS — Z7189 Other specified counseling: Secondary | ICD-10-CM

## 2020-07-11 LAB — HEPATIC FUNCTION PANEL
ALT: 17 U/L (ref 0–44)
AST: 12 U/L — ABNORMAL LOW (ref 15–41)
Albumin: 2.5 g/dL — ABNORMAL LOW (ref 3.5–5.0)
Alkaline Phosphatase: 89 U/L (ref 38–126)
Bilirubin, Direct: 0.3 mg/dL — ABNORMAL HIGH (ref 0.0–0.2)
Indirect Bilirubin: 1 mg/dL — ABNORMAL HIGH (ref 0.3–0.9)
Total Bilirubin: 1.3 mg/dL — ABNORMAL HIGH (ref 0.3–1.2)
Total Protein: 5.5 g/dL — ABNORMAL LOW (ref 6.5–8.1)

## 2020-07-11 LAB — BASIC METABOLIC PANEL
Anion gap: 9 (ref 5–15)
BUN: 46 mg/dL — ABNORMAL HIGH (ref 8–23)
CO2: 16 mmol/L — ABNORMAL LOW (ref 22–32)
Calcium: 8.8 mg/dL — ABNORMAL LOW (ref 8.9–10.3)
Chloride: 122 mmol/L — ABNORMAL HIGH (ref 98–111)
Creatinine, Ser: 1.52 mg/dL — ABNORMAL HIGH (ref 0.61–1.24)
GFR calc Af Amer: 46 mL/min — ABNORMAL LOW (ref >60)
GFR calc non Af Amer: 39 mL/min — ABNORMAL LOW (ref >60)
Glucose, Bld: 80 mg/dL (ref 70–99)
Potassium: 3.6 mmol/L (ref 3.5–5.1)
Sodium: 147 mmol/L — ABNORMAL HIGH (ref 135–145)

## 2020-07-11 LAB — CBC WITH DIFFERENTIAL/PLATELET
Abs Immature Granulocytes: 0.16 10*3/uL — ABNORMAL HIGH (ref 0.00–0.07)
Basophils Absolute: 0 10*3/uL (ref 0.0–0.1)
Basophils Relative: 0 %
Eosinophils Absolute: 0.1 10*3/uL (ref 0.0–0.5)
Eosinophils Relative: 1 %
HCT: 24.1 % — ABNORMAL LOW (ref 39.0–52.0)
Hemoglobin: 7.6 g/dL — ABNORMAL LOW (ref 13.0–17.0)
Immature Granulocytes: 2 %
Lymphocytes Relative: 19 %
Lymphs Abs: 1.6 10*3/uL (ref 0.7–4.0)
MCH: 28.4 pg (ref 26.0–34.0)
MCHC: 31.5 g/dL (ref 30.0–36.0)
MCV: 89.9 fL (ref 80.0–100.0)
Monocytes Absolute: 0.7 10*3/uL (ref 0.1–1.0)
Monocytes Relative: 8 %
Neutro Abs: 6 10*3/uL (ref 1.7–7.7)
Neutrophils Relative %: 70 %
Platelets: 268 10*3/uL (ref 150–400)
RBC: 2.68 MIL/uL — ABNORMAL LOW (ref 4.22–5.81)
RDW: 15 % (ref 11.5–15.5)
WBC: 8.6 10*3/uL (ref 4.0–10.5)
nRBC: 0 % (ref 0.0–0.2)

## 2020-07-11 LAB — GLUCOSE, CAPILLARY
Glucose-Capillary: 104 mg/dL — ABNORMAL HIGH (ref 70–99)
Glucose-Capillary: 65 mg/dL — ABNORMAL LOW (ref 70–99)
Glucose-Capillary: 73 mg/dL (ref 70–99)
Glucose-Capillary: 73 mg/dL (ref 70–99)
Glucose-Capillary: 91 mg/dL (ref 70–99)
Glucose-Capillary: 94 mg/dL (ref 70–99)

## 2020-07-11 SURGERY — INCISION AND DRAINAGE, ABSCESS
Anesthesia: General

## 2020-07-11 MED ORDER — RESOURCE THICKENUP CLEAR PO POWD
ORAL | Status: DC | PRN
  Filled 2020-07-11 (×2): qty 125

## 2020-07-11 MED ORDER — ADULT MULTIVITAMIN W/MINERALS CH
1.0000 | ORAL_TABLET | Freq: Every day | ORAL | Status: DC
  Administered 2020-07-11 – 2020-07-19 (×8): 1 via ORAL
  Filled 2020-07-11 (×9): qty 1

## 2020-07-11 MED ORDER — DEXTROSE 50 % IV SOLN
INTRAVENOUS | Status: AC
  Administered 2020-07-11: 50 mL
  Filled 2020-07-11: qty 50

## 2020-07-11 NOTE — Progress Notes (Signed)
PROGRESS NOTE    Wayne [name]  ZTI:[id number] DOB: [birth year] DOA: 07/08/2020 PCP: Isaac Bliss, Rayford Halsted, MD  Brief Narrative: Frail elderly 86 year old male with mild cognitive deficits, chronic diastolic CHF, severe protein calorie malnutrition, resident of [facility] was admitted to the ICU on 9/27 with septic shock secondary to perineal necrotizing fasciitis. -Underwent extensive debridement in the OR by urology and general surgery, remained hypotensive in PACU, admitted to the ICU on vasopressors -Stabilized and transferred to [facility] service today 9/30   Assessment & Plan:   Septic shock  Necrotizing fasciitis of scrotum/perineum -Treated with broad-spectrum antibiotics, pressors, IV fluids and albumin -Remains on broad-spectrum antibiotics, day 4 today -Blood pressure has stabilized off pressors -Blood cultures are negative, Intra-Op cultures with rare group B strep, rare Proteus -Discussed with urology and general surgery today, recommended plastic surgery evaluation for possible mesh  Severe protein calorie malnutrition Adult failure to thrive Severe debility -Overall prognosis is poor -Palliative care following  Dysphagia -Acute on chronic -Discussed with SLP, will remain high aspiration risk, plan to attempt a dysphagia 2 diet today -Discussed poor prognosis with patient's son and recommended symptom/comfort focused care, hospice at discharge seems reasonable  Acute kidney injury -In the setting of sepsis and shock, improving -Decrease fluids to LR at 75 mL today  Moderate bilateral hydronephrosis Chronic bladder outlet obstruction Now urinating without issues, creatinine improving, continue IV fluids today -Monitor for retention  Indeterminate right lower lobe level lesion -Unclear etiology, not appropriate for additional work-up at this time  DVT prophylaxis: Heparin subcutaneous Code Status: DNR Family Communication: Discussed with son [name] at  bedside Disposition Plan:  Status is: Inpatient  Remains inpatient appropriate because:Inpatient level of care appropriate due to severity of illness   Dispo: The patient is from: SNF              Anticipated d/c is to: SNF              Anticipated d/c date is: > 3 days              Patient currently is not medically stable to d/c.  Consultants:   Neurology, general surgery, PCCM   Procedures: Exploration, washout and debridement of necrotic scrotum for necrotizing fasciitis on 9/28  Antimicrobials:    Subjective: -Hungry and thirsty, wants to eat and drink something,  Objective: Vitals:   07/11/20 0224 07/11/20 0304 07/11/20 0617 07/11/20 1015  BP: 122/77 (!) 160/61 120/74 112/61  Pulse: 79 60 83 83  Resp: 20 19 19 16   Temp: 98.4 F (36.9 C) 97.9 F (36.6 C) 98 F (36.7 C) 97.7 F (36.5 C)  TempSrc:    Oral  SpO2: 96% 98% 97% 97%  Weight:  87.6 kg    Height:  5' 7.5" (1.715 m)      Intake/Output Summary (Last 24 hours) at 07/11/2020 1225 Last data filed at 07/11/2020 0620 Gross per 24 hour  Intake 1037.83 ml  Output 1600 ml  Net -562.17 ml   Filed Weights   07/08/20 1412 07/08/20 1900 07/11/20 0304  Weight: 54.4 kg 51.7 kg 87.6 kg    Examination:  General exam: Elderly frail chronically ill male sitting up in bed, awake alert oriented to self only and partly to place  respiratory system: Decreased breath sounds to bases, otherwise clear Cardiovascular system: S1 & S2 heard, RRR. Gastrointestinal system: Abdomen is nondistended, soft and nontender.Normal bowel sounds heard.,  GU with scrotal dressing noted Central nervous system: Awake alert,  pleasantly confused Extremities: Trace edema Skin: As above Psychiatry: Poor insight and judgment    Data Reviewed:   CBC: Recent Labs  Lab 07/08/20 1528 07/09/20 0412 07/10/20 0039 07/11/20 0505  WBC 13.9* 12.0* 6.1 8.6  NEUTROABS 12.6* 9.6* 4.8 6.0  HGB 10.6* 8.7* 7.3* 7.6*  HCT 33.3* 27.6* 23.0*  24.1*  MCV 89.8 90.2 89.5 89.9  PLT 380 281 208 629   Basic Metabolic Panel: Recent Labs  Lab 07/08/20 1528 07/10/20 0227 07/11/20 0505  NA 134* 144 147*  K 5.2* 3.7 3.6  CL 106 119* 122*  CO2 16* 17* 16*  GLUCOSE 114* 79 80  BUN 126* 66* 46*  CREATININE 3.23* 1.66* 1.52*  CALCIUM 8.9 8.4* 8.8*  MG  --  2.0  --    GFR: Estimated Creatinine Clearance: 33.8 mL/min (A) (by C-G formula based on SCr of 1.52 mg/dL (H)). Liver Function Tests: Recent Labs  Lab 07/08/20 1528 07/09/20 0412 07/10/20 0227 07/11/20 0505  AST 44* 23 14* 12*  ALT 45* 33 18 17  ALKPHOS 213* 155* 92 89  BILITOT 0.6 0.7 1.0 1.3*  PROT 6.6 5.8* 5.3* 5.5*  ALBUMIN 2.0* 1.7* 2.3* 2.5*   No results for input(s): LIPASE, AMYLASE in the last 168 hours. No results for input(s): AMMONIA in the last 168 hours. Coagulation Profile: Recent Labs  Lab 07/08/20 1528 07/09/20 0412  INR 1.9* 1.9*   Cardiac Enzymes: No results for input(s): CKTOTAL, CKMB, CKMBINDEX, TROPONINI in the last 168 hours. BNP (last 3 results) No results for input(s): PROBNP in the last 8760 hours. HbA1C: Recent Labs    07/09/20 0412  HGBA1C 5.2   CBG: Recent Labs  Lab 07/10/20 2117 07/11/20 0013 07/11/20 0419 07/11/20 0724 07/11/20 1130  GLUCAP 103* 73 73 65* 91   Lipid Profile: No results for input(s): CHOL, HDL, LDLCALC, TRIG, CHOLHDL, LDLDIRECT in the last 72 hours. Thyroid Function Tests: No results for input(s): TSH, T4TOTAL, FREET4, T3FREE, THYROIDAB in the last 72 hours. Anemia Panel: No results for input(s): VITAMINB12, FOLATE, FERRITIN, TIBC, IRON, RETICCTPCT in the last 72 hours. Urine analysis:    Component Value Date/Time   COLORURINE BROWN (A) 07/08/2020 2232   APPEARANCEUR TURBID (A) 07/08/2020 2232   LABSPEC 1.011 07/08/2020 2232   PHURINE 6.0 07/08/2020 2232   GLUCOSEU NEGATIVE 07/08/2020 2232   HGBUR LARGE (A) 07/08/2020 2232   BILIRUBINUR NEGATIVE 07/08/2020 2232   BILIRUBINUR n 11/11/2015  1716   KETONESUR NEGATIVE 07/08/2020 2232   PROTEINUR 100 (A) 07/08/2020 2232   UROBILINOGEN 0.2 11/11/2015 1716   UROBILINOGEN 0.2 02/15/2012 1335   NITRITE NEGATIVE 07/08/2020 2232   LEUKOCYTESUR MODERATE (A) 07/08/2020 2232   Sepsis Labs: @LABRCNTIP (procalcitonin:4,lacticidven:4)  ) Recent Results (from the past 240 hour(s))  Culture, blood (Routine x 2)     Status: None (Preliminary result)   Collection Time: 07/08/20  3:33 PM   Specimen: BLOOD  Result Value Ref Range Status   Specimen Description BLOOD RIGHT ANTECUBITAL  Final   Special Requests   Final    BOTTLES DRAWN AEROBIC AND ANAEROBIC Blood Culture adequate volume   Culture   Final    NO GROWTH 3 DAYS Performed at [facility], [place] [address] [place], [place] [address]    Report Status PENDING  Incomplete  Culture, blood (Routine x 2)     Status: None (Preliminary result)   Collection Time: 07/08/20  7:39 PM   Specimen: BLOOD  Result Value Ref Range Status  Specimen Description BLOOD LEFT ANTECUBITAL  Final   Special Requests   Final    BOTTLES DRAWN AEROBIC AND ANAEROBIC Blood Culture adequate volume   Culture   Final    NO GROWTH 3 DAYS Performed at [facility], [address], [place], [place] [address]    Report Status PENDING  Incomplete  Respiratory Panel by RT PCR (Flu A&B, Covid) - Nasopharyngeal Swab     Status: None   Collection Time: 07/08/20 11:30 PM   Specimen: Nasopharyngeal Swab  Result Value Ref Range Status   SARS Coronavirus 2 by RT PCR NEGATIVE NEGATIVE Final    Comment: (NOTE) SARS-CoV-2 target nucleic acids are NOT DETECTED.  The SARS-CoV-2 RNA is generally detectable in upper respiratoy specimens during the acute phase of infection. The lowest concentration of SARS-CoV-2 viral copies this assay can detect is 131 copies/mL. A negative result does not preclude SARS-Cov-2 infection and should not be used as the sole basis for treatment or other patient management  decisions. A negative result may occur with  improper specimen collection/handling, submission of specimen other than nasopharyngeal swab, presence of viral mutation(s) within the areas targeted by this assay, and inadequate number of viral copies (<131 copies/mL). A negative result must be combined with clinical observations, patient history, and epidemiological information. The expected result is Negative.  Fact Sheet for Patients:  PinkCheek.be  Fact Sheet for Healthcare Providers:  GravelBags.it  This test is no t yet approved or cleared by the [place] FDA and  has been authorized for detection and/or diagnosis of SARS-CoV-2 by FDA under an Emergency Use Authorization (EUA). This EUA will remain  in effect (meaning this test can be used) for the duration of the COVID-19 declaration under Section 564(b)(1) of the Act, 21 U.S.C. section 360bbb-3(b)(1), unless the authorization is terminated or revoked sooner.     Influenza A by PCR NEGATIVE NEGATIVE Final   Influenza B by PCR NEGATIVE NEGATIVE Final    Comment: (NOTE) The Xpert Xpress SARS-CoV-2/FLU/RSV assay is intended as an aid in  the diagnosis of influenza from Nasopharyngeal swab specimens and  should not be used as a sole basis for treatment. Nasal washings and  aspirates are unacceptable for Xpert Xpress SARS-CoV-2/FLU/RSV  testing.  Fact Sheet for Patients: PinkCheek.be  Fact Sheet for Healthcare Providers: GravelBags.it  This test is not yet approved or cleared by the [place] FDA and  has been authorized for detection and/or diagnosis of SARS-CoV-2 by  FDA under an Emergency Use Authorization (EUA). This EUA will remain  in effect (meaning this test can be used) for the duration of the  Covid-19 declaration under Section 564(b)(1) of the Act, 21  U.S.C. section 360bbb-3(b)(1), unless the  authorization is  terminated or revoked. Performed at [facility], [place] [address], [place], [place] [address]   Aerobic/Anaerobic Culture (surgical/deep wound)     Status: None (Preliminary result)   Collection Time: 07/09/20  9:21 AM   Specimen: Abscess  Result Value Ref Range Status   Specimen Description ABSCESS  Final   Special Requests NONE  Final   Gram Stain   Final    RARE WBC PRESENT,BOTH PMN AND MONONUCLEAR RARE GRAM POSITIVE COCCI IN PAIRS IN CLUSTERS Performed at [facility], [place] [address] [place], [place] [address]    Culture   Final    RARE GROUP B STREP(S.AGALACTIAE)ISOLATED TESTING AGAINST S. AGALACTIAE NOT ROUTINELY PERFORMED DUE TO PREDICTABILITY OF AMP/PEN/VAN SUSCEPTIBILITY. RARE PROTEUS  MIRABILIS SUSCEPTIBILITIES TO FOLLOW NO ANAEROBES ISOLATED; CULTURE IN PROGRESS FOR 5 DAYS    Report Status PENDING  Incomplete  MRSA PCR Screening     Status: None   Collection Time: 07/10/20  3:08 PM   Specimen: Nasal Mucosa; Nasopharyngeal  Result Value Ref Range Status   MRSA by PCR NEGATIVE NEGATIVE Final    Comment:        The GeneXpert MRSA Assay (FDA approved for NASAL specimens only), is one component of a comprehensive MRSA colonization surveillance program. It is not intended to diagnose MRSA infection nor to guide or monitor treatment for MRSA infections. Performed at [facility], [place] [address], [place], [place] [address]          Radiology Studies: DG Swallowing Func-Speech Pathology  Result Date: 07/11/2020 Objective Swallowing Evaluation: Type of Study: MBS-Modified Barium Swallow Study  Patient Details Name: BURLEY KOPKA MRN: [id number] Date of Birth: 1926/10/08 Today's Date: 07/11/2020 Time: SLP Start Time (ACUTE ONLY): 0901 -SLP Stop Time (ACUTE ONLY): 0917 SLP Time Calculation (min) (ACUTE ONLY): 16 min Past Medical History: Past Medical History: Diagnosis Date . Anxiety   takes Atrivan daily . Back pain   chronic with  neck pain . Cancer ([place])   skin . CHF (congestive heart failure) ([place])  . Constipation   related to medication . Dementia ([place])   takes Aricept nightly . Enlarged prostate   self caths once every 1-2wks . GERD (gastroesophageal reflux disease)   doesn't require medication . Hx of seasonal allergies  . Hyperlipidemia   takes Simvastatin daily . Hypertension   takes atenolol daily . Hypothyroidism   takes Synthroid daily . Insomnia   related to pain . MRSA (methicillin resistant staph aureus) culture positive   Per patient tested on 10/07/11. . Osteoporosis  . Peripheral edema   takes Torsemide daily . Scoliosis  Past Surgical History: Past Surgical History: Procedure Laterality Date . abdominal cyst    removed-2012;MRSA done by Dr.Gross . CATARACT EXTRACTION   . cataracts    bilateral . CERVICAL DISC SURGERY    x1 . COLONOSCOPY   . ESOPHAGOGASTRODUODENOSCOPY   . HERNIA REPAIR    Right inguinal . INCISION AND DRAINAGE OF WOUND N/A 07/09/2020  Procedure: IRRIGATION AND DEBRIDEMENT SCROTUM AND PERINEUM;  Surgeon: Rolm Bookbinder, MD;  Location: [facility];  Service: General;  Laterality: N/A; . INCISION AND DRAINAGE OF WOUND N/A 07/09/2020  Procedure: DEBRIDEMENT OF SCROTUM;  Surgeon: Robley Fries, MD;  Location: [facility];  Service: Urology;  Laterality: N/A; . INTRAMEDULLARY (IM) NAIL INTERTROCHANTERIC Left 10/12/2018 . INTRAMEDULLARY (IM) NAIL INTERTROCHANTERIC Left 10/12/2018  Procedure: INTRAMEDULLARY (IM) NAIL INTERTROCHANTRIC;  Surgeon: Renette Butters, MD;  Location: [facility];  Service: Orthopedics;  Laterality: Left; . LUMBAR DISC SURGERY    x2 . TOE SURGERY    right great toe . TRANSURETHRAL RESECTION OF PROSTATE    15+yrs ago HPI: NICKOLAOS BRALLIER is a 86 y.o. male with medical history significant of cervical fusion, anxiety, dementia, chronic back and neck pain, unspecified skin cancer, chronic diastolic CHF, constipation, BPH, GERD, hyperlipidemia, hypertension, hypothyroidism, peripheral edema, scoliosis, admitted  with scrotal edema and erythema. Underwent debridement due to severe perineal infection (necrotizing sof tissue infection) with postoperative shock. Suspected to have chronic aspiration. Palliative care meeting with son 9/28. CXR No acute disease  No data recorded Assessment / Plan / Recommendation CHL IP CLINICAL IMPRESSIONS 07/11/2020 Clinical Impression Pt has acute on chronic oropharyngeal dysphagia which he has managed  well until past several weeks leading up to current illness. Overall, general deconditioning and evidence of cervical fusion contribute to worsening dysphagia. Impairments observed in timing of laryngeal closure with delayed epiglottic deflection resulting in penetration before the swallow, during falling slightly below vocal cords with thin via straw (above cords with cup). Residue retained in vallecule mild, increased with solid. Esophageal scan unremarkable. Recommend nectar thick liquids, water protocol, Dys 2 texture, straws allowed, 2 swallows and full supervision and assist with meals. Pt may wish to return to regular diet/thin liquids if condiiton contiues to deteriorate. He is followed by Palliative care. ST will follow.     SLP Visit Diagnosis Dysphagia, pharyngeal phase (R13.13) Attention and concentration deficit following -- Frontal lobe and executive function deficit following -- Impact on safety and function Moderate aspiration risk;Severe aspiration risk   CHL IP TREATMENT RECOMMENDATION 07/11/2020 Treatment Recommendations Therapy as outlined in treatment plan below   Prognosis 07/11/2020 Prognosis for Safe Diet Advancement Fair Barriers to Reach Goals Cognitive deficits Barriers/Prognosis Comment -- CHL IP DIET RECOMMENDATION 07/11/2020 SLP Diet Recommendations Dysphagia 2 (Fine chop) solids;Nectar thick liquid Liquid Administration via Cup;Straw Medication Administration Whole meds with puree Compensations Slow rate;Small sips/bites;Clear throat intermittently;Multiple dry swallows  after each bite/sip Postural Changes Seated upright at 90 degrees   CHL IP OTHER RECOMMENDATIONS 07/11/2020 Recommended Consults -- Oral Care Recommendations Oral care BID Other Recommendations Order thickener from pharmacy   CHL IP FOLLOW UP RECOMMENDATIONS 07/11/2020 Follow up Recommendations Skilled Nursing facility   [facility] IP FREQUENCY AND DURATION 07/11/2020 Speech Therapy Frequency (ACUTE ONLY) min 2x/week Treatment Duration 2 weeks      CHL IP ORAL PHASE 07/11/2020 Oral Phase Impaired Oral - Pudding Teaspoon -- Oral - Pudding Cup -- Oral - Honey Teaspoon -- Oral - Honey Cup -- Oral - Nectar Teaspoon -- Oral - Nectar Cup Right pocketing in lateral sulci;Left pocketing in lateral sulci Oral - Nectar Straw -- Oral - Thin Teaspoon -- Oral - Thin Cup WFL Oral - Thin Straw WFL Oral - Puree -- Oral - Mech Soft Lingual/palatal residue Oral - Regular -- Oral - Multi-Consistency -- Oral - Pill -- Oral Phase - Comment --  CHL IP PHARYNGEAL PHASE 07/11/2020 Pharyngeal Phase Impaired Pharyngeal- Pudding Teaspoon -- Pharyngeal -- Pharyngeal- Pudding Cup -- Pharyngeal -- Pharyngeal- Honey Teaspoon -- Pharyngeal -- Pharyngeal- Honey Cup -- Pharyngeal -- Pharyngeal- Nectar Teaspoon -- Pharyngeal -- Pharyngeal- Nectar Cup Pharyngeal residue - valleculae Pharyngeal -- Pharyngeal- Nectar Straw -- Pharyngeal -- Pharyngeal- Thin Teaspoon -- Pharyngeal -- Pharyngeal- Thin Cup Penetration/Aspiration during swallow;Pharyngeal residue - valleculae Pharyngeal Material enters airway, remains ABOVE vocal cords and not ejected out Pharyngeal- Thin Straw Penetration/Aspiration before swallow;Penetration/Aspiration during swallow Pharyngeal Material enters airway, CONTACTS cords and not ejected out;Material enters airway, passes BELOW cords then ejected out Pharyngeal- Puree -- Pharyngeal -- Pharyngeal- Mechanical Soft Pharyngeal residue - valleculae Pharyngeal -- Pharyngeal- Regular -- Pharyngeal -- Pharyngeal- Multi-consistency -- Pharyngeal --  Pharyngeal- Pill -- Pharyngeal -- Pharyngeal Comment --  CHL IP CERVICAL ESOPHAGEAL PHASE 07/11/2020 Cervical Esophageal Phase WFL Pudding Teaspoon -- Pudding Cup -- Honey Teaspoon -- Honey Cup -- Nectar Teaspoon -- Nectar Cup -- Nectar Straw -- Thin Teaspoon -- Thin Cup -- Thin Straw -- Puree -- Mechanical Soft -- Regular -- Multi-consistency -- Pill -- Cervical Esophageal Comment -- [name] 07/11/2020, 10:37 AM [name] Colvin Caroli.Ed Risk analyst [phone] Office [phone]  Scheduled Meds: . Chlorhexidine Gluconate Cloth  6 each Topical Daily  . heparin injection (subcutaneous)  5,000 Units Subcutaneous Q8H  . levothyroxine  50 mcg Oral QAC breakfast  . liver oil-zinc oxide   Topical TID  . magic mouthwash  5 mL Oral BID  . mouth rinse  15 mL Mouth Rinse BID  . mirtazapine  15 mg Oral QHS  . sertraline  75 mg Oral Daily   Continuous Infusions: . ceFEPime (MAXIPIME) IV 2 g (07/11/20 1108)  . lactated ringers 75 mL/hr at 07/11/20 0000  . metronidazole 500 mg (07/11/20 1117)  . vancomycin Stopped (07/10/20 1817)     LOS: 3 days    Time spent: 92min    Domenic Polite, MD [facility] 07/11/2020, 12:25 PM

## 2020-07-11 NOTE — Progress Notes (Signed)
  Speech Language Pathology Treatment:    Patient Details Name: Wayne [name] MRN: [id number] DOB: Feb 18, 1926 Today's Date: 07/11/2020 Time:  -     MBS will be this morning at 9:00          Contact with secure chat or cell [phone] for today                       [name] 07/11/2020, 8:14 AM

## 2020-07-11 NOTE — H&P (View-Only) (Signed)
Reason for Consult: Perineal necrotizing fascitis Referring Physician: Dr. Jacalyn Lefevre & Dr. Sheralyn Boatman Wayne [name] is an 86 y.o. male.  HPI: Patient was admitted to the ICU on 9/27 with septic shock secondary to perineal necrotizing fasciitis.  He underwent wound debridement in the OR by urology and general surgery on 9/28.  He remained hypotensive in the PACU and was admitted to the ICU on vasopressors.  He stabilized and was transferred to [facility] service today.  Patient has mild cognitive deficits, severe protein calorie malnutrition, and chronic diastolic CHF.  Intra-op cultures with rare group B strep and rare Proteus.   Patient is awake, closing eyes often while we are speaking. He responds appropriately to questions. Son at Bedside today. Patient reports wound pain is well controlled at this time. Denies F/C, CP, SOB, N/V.   Past Medical History:  Diagnosis Date  . Anxiety    takes Atrivan daily  . Back pain    chronic with neck pain  . Cancer ([place])    skin  . CHF (congestive heart failure) ([place])   . Constipation    related to medication  . Dementia ([place])    takes Aricept nightly  . Enlarged prostate    self caths once every 1-2wks  . GERD (gastroesophageal reflux disease)    doesn't require medication  . Hx of seasonal allergies   . Hyperlipidemia    takes Simvastatin daily  . Hypertension    takes atenolol daily  . Hypothyroidism    takes Synthroid daily  . Insomnia    related to pain  . MRSA (methicillin resistant staph aureus) culture positive    Per patient tested on 10/07/11.  . Osteoporosis   . Peripheral edema    takes Torsemide daily  . Scoliosis     Past Surgical History:  Procedure Laterality Date  . abdominal cyst     removed-2012;MRSA done by Dr.Gross  . CATARACT EXTRACTION    . cataracts     bilateral  . CERVICAL DISC SURGERY     x1  . COLONOSCOPY    . ESOPHAGOGASTRODUODENOSCOPY    . HERNIA REPAIR     Right inguinal  . INCISION AND  DRAINAGE OF WOUND N/A 07/09/2020   Procedure: IRRIGATION AND DEBRIDEMENT SCROTUM AND PERINEUM;  Surgeon: Rolm Bookbinder, MD;  Location: [place];  Service: General;  Laterality: N/A;  . INCISION AND DRAINAGE OF WOUND N/A 07/09/2020   Procedure: DEBRIDEMENT OF SCROTUM;  Surgeon: Robley Fries, MD;  Location: [place];  Service: Urology;  Laterality: N/A;  . INTRAMEDULLARY (IM) NAIL INTERTROCHANTERIC Left 10/12/2018  . INTRAMEDULLARY (IM) NAIL INTERTROCHANTERIC Left 10/12/2018   Procedure: INTRAMEDULLARY (IM) NAIL INTERTROCHANTRIC;  Surgeon: Renette Butters, MD;  Location: [facility];  Service: Orthopedics;  Laterality: Left;  . LUMBAR DISC SURGERY     x2  . TOE SURGERY     right great toe  . TRANSURETHRAL RESECTION OF PROSTATE     15+yrs ago    Family History  Problem Relation Age of Onset  . Heart disease Mother 61       Vague history  . Stroke Father   . Hypertension Other        Multiple family members both sides  . Diabetes Other   . Anesthesia problems Neg Hx   . Hypotension Neg Hx   . Malignant hyperthermia Neg Hx   . Pseudochol deficiency Neg Hx     Social History:  reports that he has never smoked.  He has never used smokeless tobacco. He reports that he does not drink alcohol and does not use drugs.  Allergies:  Allergies  Allergen Reactions  . Doxazosin Mesylate Other (See Comments)    Makes blood pressure  . Propoxyphene Hcl Other (See Comments)    REACTION: upset stomach  . Penicillins Rash    DID THE REACTION INVOLVE: Swelling of the face/tongue/throat, SOB, or low BP? No Sudden or severe rash/hives, skin peeling, or the inside of the mouth or nose? Yes Did it require medical treatment? Yes When did it last happen?50 yrs ago If all above answers are "NO", may proceed with cephalosporin use.     Medications: Wayne have reviewed the patient's current medications.  Results for orders placed or performed during the hospital encounter of 07/08/20 (from the past 48  hour(s))  Glucose, capillary     Status: None   Collection Time: 07/09/20  7:46 PM  Result Value Ref Range   Glucose-Capillary 80 70 - 99 mg/dL    Comment: Glucose reference range applies only to samples taken after fasting for at least 8 hours.  CBC with Differential     Status: Abnormal   Collection Time: 07/10/20 12:39 AM  Result Value Ref Range   WBC 6.1 4.0 - 10.5 K/uL   RBC 2.57 (L) 4.22 - 5.81 MIL/uL   Hemoglobin 7.3 (L) 13.0 - 17.0 g/dL   HCT 23.0 (L) 39 - 52 %   MCV 89.5 80.0 - 100.0 fL   MCH 28.4 26.0 - 34.0 pg   MCHC 31.7 30.0 - 36.0 g/dL   RDW 14.6 11.5 - 15.5 %   Platelets 208 150 - 400 K/uL   nRBC 0.0 0.0 - 0.2 %   Neutrophils Relative % 78 %   Neutro Abs 4.8 1.7 - 7.7 K/uL   Lymphocytes Relative 12 %   Lymphs Abs 0.8 0.7 - 4.0 K/uL   Monocytes Relative 8 %   Monocytes Absolute 0.5 0 - 1 K/uL   Eosinophils Relative 1 %   Eosinophils Absolute 0.0 0 - 0 K/uL   Basophils Relative 0 %   Basophils Absolute 0.0 0 - 0 K/uL   Immature Granulocytes 1 %   Abs Immature Granulocytes 0.07 0.00 - 0.07 K/uL    Comment: Performed at [facility], [address] [place], [place] [address]  C-reactive protein     Status: Abnormal   Collection Time: 07/10/20 12:39 AM  Result Value Ref Range   CRP 14.9 (H) <1.0 mg/dL    Comment: Performed at [facility] [address] [place], [place] [address]  Glucose, capillary     Status: None   Collection Time: 07/10/20 12:48 AM  Result Value Ref Range   Glucose-Capillary 71 70 - 99 mg/dL    Comment: Glucose reference range applies only to samples taken after fasting for at least 8 hours.  Glucose, capillary     Status: None   Collection Time: 07/10/20  2:26 AM  Result Value Ref Range   Glucose-Capillary 73 70 - 99 mg/dL    Comment: Glucose reference range applies only to samples taken after fasting for at least 8 hours.  Basic metabolic panel     Status: Abnormal   Collection Time: 07/10/20  2:27 AM  Result Value Ref  Range   Sodium 144 135 - 145 mmol/L   Potassium 3.7 3.5 - 5.1 mmol/L   Chloride 119 (H) 98 - 111 mmol/L   CO2 17 (  L) 22 - 32 mmol/L   Glucose, Bld 79 70 - 99 mg/dL    Comment: Glucose reference range applies only to samples taken after fasting for at least 8 hours.   BUN 66 (H) 8 - 23 mg/dL   Creatinine, Ser 1.66 (H) 0.61 - 1.24 mg/dL    Comment: DELTA CHECK NOTED RESULTS VERIFIED VIA RECOLLECT    Calcium 8.4 (L) 8.9 - 10.3 mg/dL   GFR calc non Af Amer 35 (L) >60 mL/min   GFR calc Af Amer 41 (L) >60 mL/min   Anion gap 8 5 - 15    Comment: Performed at [facility] [address] [place], [place] [address]  Hepatic function panel     Status: Abnormal   Collection Time: 07/10/20  2:27 AM  Result Value Ref Range   Total Protein 5.3 (L) 6.5 - 8.1 g/dL   Albumin 2.3 (L) 3.5 - 5.0 g/dL   AST 14 (L) 15 - 41 U/L   ALT 18 0 - 44 U/L   Alkaline Phosphatase 92 38 - 126 U/L   Total Bilirubin 1.0 0.3 - 1.2 mg/dL   Bilirubin, Direct 0.2 0.0 - 0.2 mg/dL   Indirect Bilirubin 0.8 0.3 - 0.9 mg/dL    Comment: Performed at [facility] [address] [place], [place] [address]  Magnesium     Status: None   Collection Time: 07/10/20  2:27 AM  Result Value Ref Range   Magnesium 2.0 1.7 - 2.4 mg/dL    Comment: Performed at [facility] [address] [place], [place] [address]  Glucose, capillary     Status: None   Collection Time: 07/10/20  3:39 AM  Result Value Ref Range   Glucose-Capillary 70 70 - 99 mg/dL    Comment: Glucose reference range applies only to samples taken after fasting for at least 8 hours.  Glucose, capillary     Status: None   Collection Time: 07/10/20  8:13 AM  Result Value Ref Range   Glucose-Capillary 71 70 - 99 mg/dL    Comment: Glucose reference range applies only to samples taken after fasting for at least 8 hours.  Vancomycin, random     Status: None   Collection Time: 07/10/20 12:05 PM  Result Value Ref Range   Vancomycin Rm 6     Comment:         Random Vancomycin therapeutic range is dependent on dosage and time of specimen collection. A peak range is 20.0-40.0 ug/mL A trough range is 5.0-15.0 ug/mL        Performed at [facility] [address], [place], [place] [address]   MRSA PCR Screening     Status: None   Collection Time: 07/10/20  3:08 PM   Specimen: Nasal Mucosa; Nasopharyngeal  Result Value Ref Range   MRSA by PCR NEGATIVE NEGATIVE    Comment:        The GeneXpert MRSA Assay (FDA approved for NASAL specimens only), is one component of a comprehensive MRSA colonization surveillance program. It is not intended to diagnose MRSA infection nor to guide or monitor treatment for MRSA infections. Performed at [facility], [place] [address] [place], [place] [address]   Glucose, capillary     Status: None   Collection Time: 07/10/20  5:23 PM  Result Value Ref Range   Glucose-Capillary 71 70 - 99 mg/dL    Comment: Glucose reference range applies only to samples taken  after fasting for at least 8 hours.  Glucose, capillary     Status: Abnormal   Collection Time: 07/10/20  7:32 PM  Result Value Ref Range   Glucose-Capillary 62 (L) 70 - 99 mg/dL    Comment: Glucose reference range applies only to samples taken after fasting for at least 8 hours.  Glucose, capillary     Status: Abnormal   Collection Time: 07/10/20  9:17 PM  Result Value Ref Range   Glucose-Capillary 103 (H) 70 - 99 mg/dL    Comment: Glucose reference range applies only to samples taken after fasting for at least 8 hours.  Glucose, capillary     Status: None   Collection Time: 07/11/20 12:13 AM  Result Value Ref Range   Glucose-Capillary 73 70 - 99 mg/dL    Comment: Glucose reference range applies only to samples taken after fasting for at least 8 hours.  Glucose, capillary     Status: None   Collection Time: 07/11/20  4:19 AM  Result Value Ref Range   Glucose-Capillary 73 70 - 99 mg/dL    Comment: Glucose reference range  applies only to samples taken after fasting for at least 8 hours.  Basic metabolic panel     Status: Abnormal   Collection Time: 07/11/20  5:05 AM  Result Value Ref Range   Sodium 147 (H) 135 - 145 mmol/L   Potassium 3.6 3.5 - 5.1 mmol/L   Chloride 122 (H) 98 - 111 mmol/L   CO2 16 (L) 22 - 32 mmol/L   Glucose, Bld 80 70 - 99 mg/dL    Comment: Glucose reference range applies only to samples taken after fasting for at least 8 hours.   BUN 46 (H) 8 - 23 mg/dL   Creatinine, Ser 1.52 (H) 0.61 - 1.24 mg/dL   Calcium 8.8 (L) 8.9 - 10.3 mg/dL   GFR calc non Af Amer 39 (L) >60 mL/min   GFR calc Af Amer 46 (L) >60 mL/min   Anion gap 9 5 - 15    Comment: Performed at [facility] [address] [place], [place] [address]  CBC with Differential     Status: Abnormal   Collection Time: 07/11/20  5:05 AM  Result Value Ref Range   WBC 8.6 4.0 - 10.5 K/uL   RBC 2.68 (L) 4.22 - 5.81 MIL/uL   Hemoglobin 7.6 (L) 13.0 - 17.0 g/dL   HCT 24.1 (L) 39 - 52 %   MCV 89.9 80.0 - 100.0 fL   MCH 28.4 26.0 - 34.0 pg   MCHC 31.5 30.0 - 36.0 g/dL   RDW 15.0 11.5 - 15.5 %   Platelets 268 150 - 400 K/uL   nRBC 0.0 0.0 - 0.2 %   Neutrophils Relative % 70 %   Neutro Abs 6.0 1.7 - 7.7 K/uL   Lymphocytes Relative 19 %   Lymphs Abs 1.6 0.7 - 4.0 K/uL   Monocytes Relative 8 %   Monocytes Absolute 0.7 0 - 1 K/uL   Eosinophils Relative 1 %   Eosinophils Absolute 0.1 0 - 0 K/uL   Basophils Relative 0 %   Basophils Absolute 0.0 0 - 0 K/uL   Immature Granulocytes 2 %   Abs Immature Granulocytes 0.16 (H) 0.00 - 0.07 K/uL    Comment: Performed at [facility] [address] [place], [place] [address]  Hepatic function panel     Status: Abnormal   Collection Time: 07/11/20  5:05 AM  Result Value Ref Range   Total Protein 5.5 (L) 6.5 - 8.1 g/dL   Albumin 2.5 (L) 3.5 - 5.0 g/dL   AST 12 (L) 15 - 41 U/L   ALT 17 0 - 44 U/L   Alkaline Phosphatase 89 38 - 126 U/L   Total Bilirubin 1.3 (H) 0.3 - 1.2  mg/dL   Bilirubin, Direct 0.3 (H) 0.0 - 0.2 mg/dL   Indirect Bilirubin 1.0 (H) 0.3 - 0.9 mg/dL    Comment: Performed at [facility] [address], [place], [place] [address]  Glucose, capillary     Status: Abnormal   Collection Time: 07/11/20  7:24 AM  Result Value Ref Range   Glucose-Capillary 65 (L) 70 - 99 mg/dL    Comment: Glucose reference range applies only to samples taken after fasting for at least 8 hours.  Glucose, capillary     Status: None   Collection Time: 07/11/20 11:30 AM  Result Value Ref Range   Glucose-Capillary 91 70 - 99 mg/dL    Comment: Glucose reference range applies only to samples taken after fasting for at least 8 hours.  Glucose, capillary     Status: None   Collection Time: 07/11/20  4:31 PM  Result Value Ref Range   Glucose-Capillary 94 70 - 99 mg/dL    Comment: Glucose reference range applies only to samples taken after fasting for at least 8 hours.    DG Swallowing Func-Speech Pathology  Result Date: 07/11/2020 Objective Swallowing Evaluation: Type of Study: MBS-Modified Barium Swallow Study  Patient Details Name: Wayne [name] MRN: [id number] Date of Birth: 30-Apr-1926 Today's Date: 07/11/2020 Time: SLP Start Time (ACUTE ONLY): 0901 -SLP Stop Time (ACUTE ONLY): 0917 SLP Time Calculation (min) (ACUTE ONLY): 16 min Past Medical History: Past Medical History: Diagnosis Date . Anxiety   takes Atrivan daily . Back pain   chronic with neck pain . Cancer ([place])   skin . CHF (congestive heart failure) ([place])  . Constipation   related to medication . Dementia ([place])   takes Aricept nightly . Enlarged prostate   self caths once every 1-2wks . GERD (gastroesophageal reflux disease)   doesn't require medication . Hx of seasonal allergies  . Hyperlipidemia   takes Simvastatin daily . Hypertension   takes atenolol daily . Hypothyroidism   takes Synthroid daily . Insomnia   related to pain . MRSA (methicillin resistant staph aureus) culture positive   Per patient tested on  10/07/11. . Osteoporosis  . Peripheral edema   takes Torsemide daily . Scoliosis  Past Surgical History: Past Surgical History: Procedure Laterality Date . abdominal cyst    removed-2012;MRSA done by Dr.Gross . CATARACT EXTRACTION   . cataracts    bilateral . CERVICAL DISC SURGERY    x1 . COLONOSCOPY   . ESOPHAGOGASTRODUODENOSCOPY   . HERNIA REPAIR    Right inguinal . INCISION AND DRAINAGE OF WOUND N/A 07/09/2020  Procedure: IRRIGATION AND DEBRIDEMENT SCROTUM AND PERINEUM;  Surgeon: Rolm Bookbinder, MD;  Location: [facility];  Service: General;  Laterality: N/A; . INCISION AND DRAINAGE OF WOUND N/A 07/09/2020  Procedure: DEBRIDEMENT OF SCROTUM;  Surgeon: Robley Fries, MD;  Location: [facility];  Service: Urology;  Laterality: N/A; . INTRAMEDULLARY (IM) NAIL INTERTROCHANTERIC Left 10/12/2018 . INTRAMEDULLARY (IM) NAIL INTERTROCHANTERIC Left 10/12/2018  Procedure: INTRAMEDULLARY (IM) NAIL INTERTROCHANTRIC;  Surgeon: Renette Butters, MD;  Location: [facility];  Service: Orthopedics;  Laterality: Left; . LUMBAR DISC SURGERY    x2 . TOE SURGERY  right great toe . TRANSURETHRAL RESECTION OF PROSTATE    15+yrs ago HPI: SALEH ULBRICH is a 86 y.o. male with medical history significant of cervical fusion, anxiety, dementia, chronic back and neck pain, unspecified skin cancer, chronic diastolic CHF, constipation, BPH, GERD, hyperlipidemia, hypertension, hypothyroidism, peripheral edema, scoliosis, admitted with scrotal edema and erythema. Underwent debridement due to severe perineal infection (necrotizing sof tissue infection) with postoperative shock. Suspected to have chronic aspiration. Palliative care meeting with son 9/28. CXR No acute disease  No data recorded Assessment / Plan / Recommendation CHL IP CLINICAL IMPRESSIONS 07/11/2020 Clinical Impression Pt has acute on chronic oropharyngeal dysphagia which he has managed well until past several weeks leading up to current illness. Overall, general deconditioning and evidence of  cervical fusion contribute to worsening dysphagia. Impairments observed in timing of laryngeal closure with delayed epiglottic deflection resulting in penetration before the swallow, during falling slightly below vocal cords with thin via straw (above cords with cup). Residue retained in vallecule mild, increased with solid. Esophageal scan unremarkable. Recommend nectar thick liquids, water protocol, Dys 2 texture, straws allowed, 2 swallows and full supervision and assist with meals. Pt may wish to return to regular diet/thin liquids if condiiton contiues to deteriorate. He is followed by Palliative care. ST will follow.     SLP Visit Diagnosis Dysphagia, pharyngeal phase (R13.13) Attention and concentration deficit following -- Frontal lobe and executive function deficit following -- Impact on safety and function Moderate aspiration risk;Severe aspiration risk   CHL IP TREATMENT RECOMMENDATION 07/11/2020 Treatment Recommendations Therapy as outlined in treatment plan below   Prognosis 07/11/2020 Prognosis for Safe Diet Advancement Fair Barriers to Reach Goals Cognitive deficits Barriers/Prognosis Comment -- CHL IP DIET RECOMMENDATION 07/11/2020 SLP Diet Recommendations Dysphagia 2 (Fine chop) solids;Nectar thick liquid Liquid Administration via Cup;Straw Medication Administration Whole meds with puree Compensations Slow rate;Small sips/bites;Clear throat intermittently;Multiple dry swallows after each bite/sip Postural Changes Seated upright at 90 degrees   CHL IP OTHER RECOMMENDATIONS 07/11/2020 Recommended Consults -- Oral Care Recommendations Oral care BID Other Recommendations Order thickener from pharmacy   CHL IP FOLLOW UP RECOMMENDATIONS 07/11/2020 Follow up Recommendations Skilled Nursing facility   [facility] IP FREQUENCY AND DURATION 07/11/2020 Speech Therapy Frequency (ACUTE ONLY) min 2x/week Treatment Duration 2 weeks      CHL IP ORAL PHASE 07/11/2020 Oral Phase Impaired Oral - Pudding Teaspoon -- Oral - Pudding  Cup -- Oral - Honey Teaspoon -- Oral - Honey Cup -- Oral - Nectar Teaspoon -- Oral - Nectar Cup Right pocketing in lateral sulci;Left pocketing in lateral sulci Oral - Nectar Straw -- Oral - Thin Teaspoon -- Oral - Thin Cup WFL Oral - Thin Straw WFL Oral - Puree -- Oral - Mech Soft Lingual/palatal residue Oral - Regular -- Oral - Multi-Consistency -- Oral - Pill -- Oral Phase - Comment --  CHL IP PHARYNGEAL PHASE 07/11/2020 Pharyngeal Phase Impaired Pharyngeal- Pudding Teaspoon -- Pharyngeal -- Pharyngeal- Pudding Cup -- Pharyngeal -- Pharyngeal- Honey Teaspoon -- Pharyngeal -- Pharyngeal- Honey Cup -- Pharyngeal -- Pharyngeal- Nectar Teaspoon -- Pharyngeal -- Pharyngeal- Nectar Cup Pharyngeal residue - valleculae Pharyngeal -- Pharyngeal- Nectar Straw -- Pharyngeal -- Pharyngeal- Thin Teaspoon -- Pharyngeal -- Pharyngeal- Thin Cup Penetration/Aspiration during swallow;Pharyngeal residue - valleculae Pharyngeal Material enters airway, remains ABOVE vocal cords and not ejected out Pharyngeal- Thin Straw Penetration/Aspiration before swallow;Penetration/Aspiration during swallow Pharyngeal Material enters airway, CONTACTS cords and not ejected out;Material enters airway, passes BELOW cords then ejected out Pharyngeal- Puree -- Pharyngeal --  Pharyngeal- Mechanical Soft Pharyngeal residue - valleculae Pharyngeal -- Pharyngeal- Regular -- Pharyngeal -- Pharyngeal- Multi-consistency -- Pharyngeal -- Pharyngeal- Pill -- Pharyngeal -- Pharyngeal Comment --  CHL IP CERVICAL ESOPHAGEAL PHASE 07/11/2020 Cervical Esophageal Phase WFL Pudding Teaspoon -- Pudding Cup -- Honey Teaspoon -- Honey Cup -- Nectar Teaspoon -- Nectar Cup -- Nectar Straw -- Thin Teaspoon -- Thin Cup -- Thin Straw -- Puree -- Mechanical Soft -- Regular -- Multi-consistency -- Pill -- Cervical Esophageal Comment -- Wayne [name] 07/11/2020, 10:37 AM [name] Colvin Caroli.Ed Actor Pager [phone] Office [phone]                Review of Systems  Constitutional: Negative for chills and fever.  Respiratory: Negative for shortness of breath.   Cardiovascular: Negative for chest pain.  Gastrointestinal: Negative for nausea and vomiting.  Genitourinary:       Reports pain well controlled   Blood pressure 112/61, pulse 83, temperature 97.7 F (36.5 C), temperature source Oral, resp. rate 16, height 5' 7.5" (1.715 m), weight 87.6 kg, SpO2 97 %. Physical Exam Constitutional:      General: He is not in acute distress.    Appearance: He is underweight.     Comments: Patient appears drowsy often closing eyes as we speak, but responds to questions with brief but appropriate answers. Chronically ill appearing.  HENT:     Head: Normocephalic and atraumatic.  Pulmonary:     Effort: Pulmonary effort is normal.  Genitourinary:    Comments: Moist to Dry wound dressing changed by urology today. Psychiatric:        Behavior: Behavior is cooperative.     Assessment/Plan: Chart reviewed and Dr. Claudia Desanctis discussed with Urology. Agree with continued moist to dry dressing changes. Plan to take patient to OR next week to apply wound matrix mesh.  Discussed proposed plan with patient and his son. They are in agreement with proposed plan.    Threasa Heads, PA-C 07/11/2020, 5:56 PM

## 2020-07-11 NOTE — Progress Notes (Signed)
  Speech Language Pathology Treatment: Dysphagia  Patient Details Name: Wayne [name] MRN: [id number] DOB: 1926/02/29 Today's Date: 07/11/2020 Time: 8115-7262 SLP Time Calculation (min) (ACUTE ONLY): 18 min  Assessment / Plan / Recommendation Clinical Impression  Son was unable to make it to the scheduled time for MBS. Results reviewed with pt and son Wayne [name]) and answered questions regarding thickener, food texture and water protocol. Ongoing recommendations re: po's and pt to have unrestricted po's if desires is dependent on how he does from medical standpoint with his current significant issues.    HPI HPI: Wayne [name] is a 86 y.o. male with medical history significant of cervical fusion, anxiety, dementia, chronic back and neck pain, unspecified skin cancer, chronic diastolic CHF, constipation, BPH, GERD, hyperlipidemia, hypertension, hypothyroidism, peripheral edema, scoliosis, admitted with scrotal edema and erythema. Underwent debridement due to severe perineal infection (necrotizing sof tissue infection) with postoperative shock. Suspected to have chronic aspiration. Palliative care meeting with son 9/28. CXR No acute disease      SLP Plan  Continue with current plan of care       Recommendations  Diet recommendations: Dysphagia 2 (fine chop);Nectar-thick liquid Liquids provided via: Cup;Straw Medication Administration: Crushed with puree Supervision: Patient able to self feed;Staff to assist with self feeding;Full supervision/cueing for compensatory strategies Compensations: Slow rate;Small sips/bites;Lingual sweep for clearance of pocketing;Multiple dry swallows after each bite/sip;Clear throat intermittently Postural Changes and/or Swallow Maneuvers: Seated upright 90 degrees                Oral Care Recommendations: Oral care BID Follow up Recommendations: Skilled Nursing facility SLP Visit Diagnosis: Dysphagia, pharyngeal phase (R13.13) Plan: Continue with current  plan of care       GO                Wayne [name] 07/11/2020, 11:31 AM Wayne [name].Ed CCC-SLP Speech-Language Pathologist [phone] for 9/30 only Office [phone]

## 2020-07-11 NOTE — Progress Notes (Addendum)
Intv/Subj: No acute events overnight.  Patient is just returning from a swallow study.  He is more interactive today.    Principal Problem:   Cellulitis of scrotum Active Problems:   Hypothyroidism   Hyperlipidemia   Essential hypertension   Dementia ([place])   Stercoral colitis   Obstructive uropathy   Bilateral hydronephrosis   Sepsis due to undetermined organism ([place])   AKI (acute kidney injury) ([place])   Acute UTI (urinary tract infection)   Hyperkalemia   Pressure injury of skin   Goals of care, counseling/discussion   Palliative care by specialist  Current Facility-Administered Medications  Medication Dose Route Frequency Provider Last Rate Last Admin  . acetaminophen (TYLENOL) tablet 650 mg  650 mg Oral Q6H PRN Norm Parcel, PA-C   650 mg at 07/09/20 1644   Or  . acetaminophen (TYLENOL) suppository 650 mg  650 mg Rectal Q6H PRN Norm Parcel, PA-C      . ceFEPIme (MAXIPIME) 2 g in sodium chloride 0.9 % 100 mL IVPB  2 g Intravenous Q24H Barkley Boards R, PA-C 200 mL/hr at 07/10/20 2101 2 g at 07/10/20 2101  . Chlorhexidine Gluconate Cloth 2 % PADS 6 each  6 each Topical Daily Kipp Brood, MD   6 each at 07/11/20 [phone]  . heparin injection 5,000 Units  5,000 Units Subcutaneous Q8H Kipp Brood, MD   5,000 Units at 07/11/20 0636  . lactated ringers infusion   Intravenous Continuous Kipp Brood, MD 75 mL/hr at 07/11/20 0000 New Bag at 07/11/20 0000  . levothyroxine (SYNTHROID) tablet 50 mcg  50 mcg Oral QAC breakfast Norm Parcel, PA-C   50 mcg at 07/10/20 3976  . liver oil-zinc oxide (DESITIN) 40 % ointment   Topical TID Norm Parcel, PA-C   Given at 07/11/20 0000  . magic mouthwash  5 mL Oral BID Norm Parcel, PA-C   5 mL at 07/10/20 1501  . MEDLINE mouth rinse  15 mL Mouth Rinse BID Agarwala, Ravi, MD   15 mL at 07/11/20 0000  . metroNIDAZOLE (FLAGYL) IVPB 500 mg  500 mg Intravenous Q8H Norm Parcel, PA-C 100 mL/hr at 07/11/20 0634 500 mg at  07/11/20 0634  . mirtazapine (REMERON) tablet 15 mg  15 mg Oral QHS Norm Parcel, PA-C   15 mg at 07/09/20 2143  . ondansetron (ZOFRAN) tablet 4 mg  4 mg Oral Q6H PRN Norm Parcel, PA-C       Or  . ondansetron [facility]) injection 4 mg  4 mg Intravenous Q6H PRN Norm Parcel, PA-C      . polyethylene glycol (MIRALAX / GLYCOLAX) packet 17 g  17 g Oral Daily PRN Candee Furbish, MD      . senna-docusate (Senokot-S) tablet 1 tablet  1 tablet Oral QHS PRN Norm Parcel, PA-C      . sertraline (ZOLOFT) tablet 75 mg  75 mg Oral Daily Norm Parcel, PA-C      . traMADol Veatrice Bourbon) tablet 50 mg  50 mg Oral TID PRN Barkley Boards R, PA-C   50 mg at 07/09/20 1644  . vancomycin (VANCOREADY) IVPB 750 mg/150 mL  750 mg Intravenous Q24H Thea Gist, RPH   Stopped at 07/10/20 1817     Objective: Vital: Vitals:   07/10/20 2215 07/11/20 0224 07/11/20 0304 07/11/20 0617  BP: 117/64 122/77 (!) 160/61 120/74  Pulse: 70 79 60 83  Resp: (!) 24 20 19 19   Temp: Marland Kitchen)  97.5 F (36.4 C) 98.4 F (36.9 C) 97.9 F (36.6 C) 98 F (36.7 C)  TempSrc: Oral     SpO2: 96% 96% 98% 97%  Weight:   87.6 kg   Height:   5' 7.5" (1.715 m)    I/Os: I/O last 3 completed shifts: In: 5415.8 [I.V.:4489.8; IV Piggyback:926] Out: 2625 [Urine:2625]  Physical Exam:  General: Patient is in no apparent distress Lungs: Normal respiratory effort, chest expands symmetrically. GI:abd soft, ntnd GU: packing removed, no purulent drainage seen, wound is weeping slightly with fibrinous exudate on testis; repacked with saline soaked kerlix Foley: 16Fr council tip draining clear yellow urine  Ext: lower extremities symmetric  Lab Results: Recent Labs    07/09/20 0412 07/10/20 0039 07/11/20 0505  WBC 12.0* 6.1 8.6  HGB 8.7* 7.3* 7.6*  HCT 27.6* 23.0* 24.1*   Recent Labs    07/08/20 1528 07/10/20 0227 07/11/20 0505  NA 134* 144 147*  K 5.2* 3.7 3.6  CL 106 119* 122*  CO2 16* 17* 16*  GLUCOSE 114* 79 80   BUN 126* 66* 46*  CREATININE 3.23* 1.66* 1.52*  CALCIUM 8.9 8.4* 8.8*   Recent Labs    07/08/20 1528 07/09/20 0412  INR 1.9* 1.9*   No results for input(s): LABURIN in the last 72 hours. Results for orders placed or performed during the hospital encounter of 07/08/20  Culture, blood (Routine x 2)     Status: None (Preliminary result)   Collection Time: 07/08/20  3:33 PM   Specimen: BLOOD  Result Value Ref Range Status   Specimen Description BLOOD RIGHT ANTECUBITAL  Final   Special Requests   Final    BOTTLES DRAWN AEROBIC AND ANAEROBIC Blood Culture adequate volume   Culture   Final    NO GROWTH 2 DAYS Performed at [facility], [place] [address] [place], [place] [address]    Report Status PENDING  Incomplete  Culture, blood (Routine x 2)     Status: None (Preliminary result)   Collection Time: 07/08/20  7:39 PM   Specimen: BLOOD  Result Value Ref Range Status   Specimen Description BLOOD LEFT ANTECUBITAL  Final   Special Requests   Final    BOTTLES DRAWN AEROBIC AND ANAEROBIC Blood Culture adequate volume   Culture   Final    NO GROWTH 2 DAYS Performed at [facility], [place] [address], [place], [place] [address]    Report Status PENDING  Incomplete  Respiratory Panel by RT PCR (Flu A&B, Covid) - Nasopharyngeal Swab     Status: None   Collection Time: 07/08/20 11:30 PM   Specimen: Nasopharyngeal Swab  Result Value Ref Range Status   SARS Coronavirus 2 by RT PCR NEGATIVE NEGATIVE Final    Comment: (NOTE) SARS-CoV-2 target nucleic acids are NOT DETECTED.  The SARS-CoV-2 RNA is generally detectable in upper respiratoy specimens during the acute phase of infection. The lowest concentration of SARS-CoV-2 viral copies this assay can detect is 131 copies/mL. A negative result does not preclude SARS-Cov-2 infection and should not be used as the sole basis for treatment or other patient management decisions. A negative result may occur with  improper specimen  collection/handling, submission of specimen other than nasopharyngeal swab, presence of viral mutation(s) within the areas targeted by this assay, and inadequate number of viral copies (<131 copies/mL). A negative result must be combined with clinical observations, patient history, and epidemiological information. The expected result is Negative.  Fact Sheet for Patients:  PinkCheek.be  Fact Sheet for Healthcare Providers:  GravelBags.it  This test is no t yet approved or cleared by the [place] FDA and  has been authorized for detection and/or diagnosis of SARS-CoV-2 by FDA under an Emergency Use Authorization (EUA). This EUA will remain  in effect (meaning this test can be used) for the duration of the COVID-19 declaration under Section 564(b)(1) of the Act, 21 U.S.C. section 360bbb-3(b)(1), unless the authorization is terminated or revoked sooner.     Influenza A by PCR NEGATIVE NEGATIVE Final   Influenza B by PCR NEGATIVE NEGATIVE Final    Comment: (NOTE) The Xpert Xpress SARS-CoV-2/FLU/RSV assay is intended as an aid in  the diagnosis of influenza from Nasopharyngeal swab specimens and  should not be used as a sole basis for treatment. Nasal washings and  aspirates are unacceptable for Xpert Xpress SARS-CoV-2/FLU/RSV  testing.  Fact Sheet for Patients: PinkCheek.be  Fact Sheet for Healthcare Providers: GravelBags.it  This test is not yet approved or cleared by the [place] FDA and  has been authorized for detection and/or diagnosis of SARS-CoV-2 by  FDA under an Emergency Use Authorization (EUA). This EUA will remain  in effect (meaning this test can be used) for the duration of the  Covid-19 declaration under Section 564(b)(1) of the Act, 21  U.S.C. section 360bbb-3(b)(1), unless the authorization is  terminated or revoked. Performed at [facility], [place] [address], [place], [place] [address]   Aerobic/Anaerobic Culture (surgical/deep wound)     Status: None (Preliminary result)   Collection Time: 07/09/20  9:21 AM   Specimen: Abscess  Result Value Ref Range Status   Specimen Description ABSCESS  Final   Special Requests NONE  Final   Gram Stain   Final    RARE WBC PRESENT,BOTH PMN AND MONONUCLEAR RARE GRAM POSITIVE COCCI IN PAIRS IN CLUSTERS    Culture   Final    CULTURE REINCUBATED FOR BETTER GROWTH Performed at [facility], [place] [address] [place], [place] [address]    Report Status PENDING  Incomplete  MRSA PCR Screening     Status: None   Collection Time: 07/10/20  3:08 PM   Specimen: Nasal Mucosa; Nasopharyngeal  Result Value Ref Range Status   MRSA by PCR NEGATIVE NEGATIVE Final    Comment:        The GeneXpert MRSA Assay (FDA approved for NASAL specimens only), is one component of a comprehensive MRSA colonization surveillance program. It is not intended to diagnose MRSA infection nor to guide or monitor treatment for MRSA infections. Performed at [facility], [place] [address], [place], [place] [address]     Studies/Results: No results found.  Assessment: 86 yo man presented to ED with scrotal swelling with scrotal/perineal NSTI now POD2 s/p debridement clinically improving. Given improvement will continue with dressing changes and cancel OR today.   Plan: -daily dressing changes -continue antibiotics and follow up wound culture -continue foley for urinary retention and while wound heals -recommend plastic surgery consult for wound coverage    Jacalyn Lefevre, MD Urology 07/11/2020, 8:05 AM

## 2020-07-11 NOTE — Progress Notes (Signed)
Modified Barium Swallow Progress Note  Patient Details  Name: Wayne [name] MRN: [id number] Date of Birth: 06-14-1926  Today's Date: 07/11/2020  Modified Barium Swallow completed.  Full report located under Chart Review in the Imaging Section.  Brief recommendations include the following:  Clinical Impression  Pt has acute on chronic oropharyngeal dysphagia which he has managed well until past several weeks leading up to current illness. Overall, general deconditioning and evidence of cervical fusion contribute to worsening dysphagia. Impairments observed in timing of laryngeal closure with delayed epiglottic deflection resulting in penetration before the swallow, during falling slightly below vocal cords with thin via straw (above cords with cup). Residue retained in vallecule mild, increased with solid. Esophageal scan unremarkable. Recommend nectar thick liquids, water protocol, Dys 2 texture, straws allowed, 2 swallows and full supervision and assist with meals. Pt may wish to return to regular diet/thin liquids if condiiton contiues to deteriorate. He is followed by Palliative care. ST will follow.       Swallow Evaluation Recommendations       SLP Diet Recommendations: Dysphagia 2 (Fine chop) solids;Nectar thick liquid   Liquid Administration via: Cup;Straw   Medication Administration: Whole meds with puree   Supervision: Full assist for feeding;Staff to assist with self feeding;Patient able to self feed   Compensations: Slow rate;Small sips/bites;Clear throat intermittently;Multiple dry swallows after each bite/sip   Postural Changes: Seated upright at 90 degrees   Oral Care Recommendations: Oral care BID   Other Recommendations: Order thickener from pharmacy    [name] 07/11/2020,10:38 AM  [name] Colvin Caroli.Ed Risk analyst [phone] Office [phone]

## 2020-07-11 NOTE — Progress Notes (Addendum)
2 Days Post-Op   Subjective/Chief Complaint: Out of the unit. Arouses to voice and states he doesn't feel well but is unable to describe. Asking to drink liquids  MBS scheduled for 0900  Afebrile, Hr and BP are normal, leukocytosis resolved (8.6) Creatinine 1.58 from 1.66 Objective: Vital signs in last 24 hours: Temp:  [97.3 F (36.3 C)-98.4 F (36.9 C)] 98 F (36.7 C) (09/30 0617) Pulse Rate:  [60-86] 83 (09/30 0617) Resp:  [17-24] 19 (09/30 0617) BP: (88-160)/(45-77) 120/74 (09/30 0617) SpO2:  [85 %-98 %] 97 % (09/30 0617) Weight:  [87.6 kg] 87.6 kg (09/30 0304) Last BM Date:  (PTA)  Intake/Output from previous day: 09/29 0701 - 09/30 0700 In: 1693.6 [I.V.:1362.8; IV Piggyback:330.9] Out: 1600 [Urine:1600] Intake/Output this shift: No intake/output data recorded.  General: chronically ill appearing white male laying in bed, NAD HEENT: head -normocephalic, atraumatic;  Neck- Trachea is midline CV- RRR Pulm- breathing is non-labored Abd- soft, NT/ND GU- deferred; urology to change perineal/scrotal dressing today. Photos in chart from my exam yesterday. MSK- UE/LE symmetrical  Lab Results:  Recent Labs    07/10/20 0039 07/11/20 0505  WBC 6.1 8.6  HGB 7.3* 7.6*  HCT 23.0* 24.1*  PLT 208 268   BMET Recent Labs    07/10/20 0227 07/11/20 0505  NA 144 147*  K 3.7 3.6  CL 119* 122*  CO2 17* 16*  GLUCOSE 79 80  BUN 66* 46*  CREATININE 1.66* 1.52*  CALCIUM 8.4* 8.8*   PT/INR Recent Labs    07/08/20 1528 07/09/20 0412  LABPROT 21.0* 21.3*  INR 1.9* 1.9*   ABG No results for input(s): PHART, HCO3 in the last 72 hours.  Invalid input(s): PCO2, PO2  Studies/Results: No results found.  Anti-infectives: Anti-infectives (From admission, onward)   Start     Dose/Rate Route Frequency Ordered Stop   07/10/20 1700  vancomycin (VANCOREADY) IVPB 750 mg/150 mL        750 mg 150 mL/hr over 60 Minutes Intravenous Every 24 hours 07/10/20 1605     07/10/20  1500  vancomycin (VANCOREADY) IVPB 750 mg/150 mL  Status:  Discontinued        750 mg 150 mL/hr over 60 Minutes Intravenous  Once 07/10/20 1358 07/10/20 1605   07/09/20 2000  ceFEPIme (MAXIPIME) 2 g in sodium chloride 0.9 % 100 mL IVPB        2 g 200 mL/hr over 30 Minutes Intravenous Every 24 hours 07/08/20 1955     07/09/20 0600  metroNIDAZOLE (FLAGYL) IVPB 500 mg        500 mg 100 mL/hr over 60 Minutes Intravenous Every 8 hours 07/09/20 0332     07/08/20 2309  vancomycin variable dose per unstable renal function (pharmacist dosing)  Status:  Discontinued         Does not apply See admin instructions 07/08/20 2309 07/10/20 1605   07/08/20 2215  metroNIDAZOLE (FLAGYL) IVPB 500 mg        500 mg 100 mL/hr over 60 Minutes Intravenous  Once 07/08/20 2207 07/08/20 2327   07/08/20 1945  ceFEPIme (MAXIPIME) 2 g in sodium chloride 0.9 % 100 mL IVPB        2 g 200 mL/hr over 30 Minutes Intravenous  Once 07/08/20 1933 07/08/20 2033   07/08/20 1900  vancomycin (VANCOCIN) IVPB 1000 mg/200 mL premix        1,000 mg 200 mL/hr over 60 Minutes Intravenous  Once 07/08/20 1853 07/08/20 2220  Assessment/Plan: STI scrotum -afebrile, leukocytosis resolved, hemodynamically stable - no acute general surgery needs,  I do not think further debridement of the perineam is warranted at this time. Urology to evaluate the wound/testes today. -cont abx -cont dressing changes, moist to dry q shift  Jill Alexanders 07/11/2020

## 2020-07-11 NOTE — Progress Notes (Signed)
Daily Progress Note   Patient Name: Wayne [name]       Date: 07/11/2020 DOB: 04-15-1926  Age: 86 y.o. MRN#: [id number] Attending Physician: Domenic Polite, MD Primary Care Physician: Isaac Bliss, Rayford Halsted, MD Admit Date: 07/08/2020  Reason for Follow-up: continued GOC discussion  Subjective: I spoke with son/David by phone. Discussed results of MBS suggesting dysphagia, and SLP recommendations. Discussed that it is unclear at this point if patient will have enough po intake to sustain himself long-term. Provided education that there is already some degree of malnutrition, as evidenced by low albumin and recent weight loss. We discussed that this is a sign of approaching end of life--David verbalizes understanding and acceptance.    Reviewed with [name] the concept of a comfort path, which helps the patient feel as good as he can for as long as he can with the goal of comfort and dignity rather than prolonginging life.  Reviewed hospice philosophy and information on home vs residential hospice services - answered all questions.  [name] is receptive to the hospice philosophy, and plans to speak with his siblings tonight at dinner so that they can make a decision together as a family.   Length of Stay: 3  Current Medications: Scheduled Meds:  . Chlorhexidine Gluconate Cloth  6 each Topical Daily  . heparin injection (subcutaneous)  5,000 Units Subcutaneous Q8H  . levothyroxine  50 mcg Oral QAC breakfast  . liver oil-zinc oxide   Topical TID  . magic mouthwash  5 mL Oral BID  . mouth rinse  15 mL Mouth Rinse BID  . mirtazapine  15 mg Oral QHS  . sertraline  75 mg Oral Daily    Continuous Infusions: . ceFEPime (MAXIPIME) IV 2 g (07/11/20 1108)  . lactated ringers 75 mL/hr at 07/11/20  0000  . metronidazole 500 mg (07/11/20 6644)  . vancomycin Stopped (07/10/20 1817)    PRN Meds: acetaminophen **OR** acetaminophen, ondansetron **OR** ondansetron (ZOFRAN) IV, polyethylene glycol, Resource ThickenUp Clear, senna-docusate, traMADol   Vital Signs: BP 112/61 (BP Location: Left Arm)   Pulse 83   Temp 97.7 F (36.5 C) (Oral)   Resp 16   Ht 5' 7.5" (1.715 m)   Wt 87.6 kg   SpO2 97%   BMI 29.80 kg/m  SpO2: SpO2: 97 % O2 Device: O2  Device: Room Air O2 Flow Rate:    Intake/output summary:   Intake/Output Summary (Last 24 hours) at 07/11/2020 1413 Last data filed at 07/11/2020 1000 Gross per 24 hour  Intake 1822.82 ml  Output 1600 ml  Net 222.82 ml   LBM: Last BM Date:  (PTA) Baseline Weight: Weight: 54.4 kg Most recent weight: Weight: 87.6 kg       Palliative Assessment/Data: PPS 30%        Palliative Care Assessment & Plan    HPI/Patient Profile: 86 y.o. male  with past medical history of hypertension, hyperlipidemia, dementia, chronic back pain, BPH, and hypothyroidism presented to [facility] emergency department via EMS on 07/08/2020 with complaint of scrotal erythema and edema.  In the ED, CT abdomen/pelvis with extensive phlegmon from the left gluteal cleft all the way to the base of the penile corpora. Moderate bilateral hydro-retronephrosis without obstructing calculus. Possible superimposed cystitis. Scrotal ultrasound shows extensive soft tissue edema.  Patient went to the OR on 9/28 for excisional debridement for NSTI of scrotum and perineum 9/28.   Assessment: - NSTI of scrotum and perineum (s/p debridement) - dementia - advanced age  - chronic back pain - dysphasia - failure to thrive  Recommendations/Plan: - continue current plan of care - son will discuss hospice with his siblings this evening - after patient is medically stable and optimized, plan would be discharge back to [facility] with hospice care  - PMT will follow up with son in the  next 1-2 days   Goals of Care and Additional Recommendations: Limitations on Scope of Treatment: no feeding tube  Code Status: DNR (present on admission)  Prognosis:   less than 6 months  Discharge Planning:  Back to [facility] nursing home (long-term) with either outpatient palliative or hospice care  Care plan was discussed with bedside RN  Thank you for allowing the Palliative Medicine Team to assist in the care of this patient.   Total Time 25 minutes Prolonged Time Billed  no       Greater than 50%  of this time was spent counseling and coordinating care related to the above assessment and plan.  Lavena Bullion, NP  Please contact Palliative Medicine Team phone at [phone] for questions and concerns.

## 2020-07-11 NOTE — Consult Note (Signed)
Reason for Consult: Perineal necrotizing fascitis Referring Physician: Dr. Jacalyn Lefevre & Dr. Sheralyn Boatman I Mortensen is an 86 y.o. male.  HPI: Patient was admitted to the ICU on 9/27 with septic shock secondary to perineal necrotizing fasciitis.  He underwent wound debridement in the OR by urology and general surgery on 9/28.  He remained hypotensive in the PACU and was admitted to the ICU on vasopressors.  He stabilized and was transferred to [facility] service today.  Patient has mild cognitive deficits, severe protein calorie malnutrition, and chronic diastolic CHF.  Intra-op cultures with rare group B strep and rare Proteus.   Patient is awake, closing eyes often while we are speaking. He responds appropriately to questions. Son at Bedside today. Patient reports wound pain is well controlled at this time. Denies F/C, CP, SOB, N/V.   Past Medical History:  Diagnosis Date  . Anxiety    takes Atrivan daily  . Back pain    chronic with neck pain  . Cancer ([place])    skin  . CHF (congestive heart failure) ([place])   . Constipation    related to medication  . Dementia ([place])    takes Aricept nightly  . Enlarged prostate    self caths once every 1-2wks  . GERD (gastroesophageal reflux disease)    doesn't require medication  . Hx of seasonal allergies   . Hyperlipidemia    takes Simvastatin daily  . Hypertension    takes atenolol daily  . Hypothyroidism    takes Synthroid daily  . Insomnia    related to pain  . MRSA (methicillin resistant staph aureus) culture positive    Per patient tested on 10/07/11.  . Osteoporosis   . Peripheral edema    takes Torsemide daily  . Scoliosis     Past Surgical History:  Procedure Laterality Date  . abdominal cyst     removed-2012;MRSA done by Dr.Gross  . CATARACT EXTRACTION    . cataracts     bilateral  . CERVICAL DISC SURGERY     x1  . COLONOSCOPY    . ESOPHAGOGASTRODUODENOSCOPY    . HERNIA REPAIR     Right inguinal  . INCISION AND  DRAINAGE OF WOUND N/A 07/09/2020   Procedure: IRRIGATION AND DEBRIDEMENT SCROTUM AND PERINEUM;  Surgeon: Rolm Bookbinder, MD;  Location: [place];  Service: General;  Laterality: N/A;  . INCISION AND DRAINAGE OF WOUND N/A 07/09/2020   Procedure: DEBRIDEMENT OF SCROTUM;  Surgeon: Robley Fries, MD;  Location: [place] [place];  Service: Urology;  Laterality: N/A;  . INTRAMEDULLARY (IM) NAIL INTERTROCHANTERIC Left 10/12/2018  . INTRAMEDULLARY (IM) NAIL INTERTROCHANTERIC Left 10/12/2018   Procedure: INTRAMEDULLARY (IM) NAIL INTERTROCHANTRIC;  Surgeon: Renette Butters, MD;  Location: [place];  Service: Orthopedics;  Laterality: Left;  . LUMBAR DISC SURGERY     x2  . TOE SURGERY     right great toe  . TRANSURETHRAL RESECTION OF PROSTATE     15+yrs ago    Family History  Problem Relation Age of Onset  . Heart disease Mother 81       Vague history  . Stroke Father   . Hypertension Other        Multiple family members both sides  . Diabetes Other   . Anesthesia problems Neg Hx   . Hypotension Neg Hx   . Malignant hyperthermia Neg Hx   . Pseudochol deficiency Neg Hx     Social History:  reports that he has never smoked.  He has never used smokeless tobacco. He reports that he does not drink alcohol and does not use drugs.  Allergies:  Allergies  Allergen Reactions  . Doxazosin Mesylate Other (See Comments)    Makes blood pressure  . Propoxyphene Hcl Other (See Comments)    REACTION: upset stomach  . Penicillins Rash    DID THE REACTION INVOLVE: Swelling of the face/tongue/throat, SOB, or low BP? No Sudden or severe rash/hives, skin peeling, or the inside of the mouth or nose? Yes Did it require medical treatment? Yes When did it last happen?50 yrs ago If all above answers are "NO", may proceed with cephalosporin use.     Medications: I have reviewed the patient's current medications.  Results for orders placed or performed during the hospital encounter of 07/08/20 (from the past 48  hour(s))  Glucose, capillary     Status: None   Collection Time: 07/09/20  7:46 PM  Result Value Ref Range   Glucose-Capillary 80 70 - 99 mg/dL    Comment: Glucose reference range applies only to samples taken after fasting for at least 8 hours.  CBC with Differential     Status: Abnormal   Collection Time: 07/10/20 12:39 AM  Result Value Ref Range   WBC 6.1 4.0 - 10.5 K/uL   RBC 2.57 (L) 4.22 - 5.81 MIL/uL   Hemoglobin 7.3 (L) 13.0 - 17.0 g/dL   HCT 23.0 (L) 39 - 52 %   MCV 89.5 80.0 - 100.0 fL   MCH 28.4 26.0 - 34.0 pg   MCHC 31.7 30.0 - 36.0 g/dL   RDW 14.6 11.5 - 15.5 %   Platelets 208 150 - 400 K/uL   nRBC 0.0 0.0 - 0.2 %   Neutrophils Relative % 78 %   Neutro Abs 4.8 1.7 - 7.7 K/uL   Lymphocytes Relative 12 %   Lymphs Abs 0.8 0.7 - 4.0 K/uL   Monocytes Relative 8 %   Monocytes Absolute 0.5 0 - 1 K/uL   Eosinophils Relative 1 %   Eosinophils Absolute 0.0 0 - 0 K/uL   Basophils Relative 0 %   Basophils Absolute 0.0 0 - 0 K/uL   Immature Granulocytes 1 %   Abs Immature Granulocytes 0.07 0.00 - 0.07 K/uL    Comment: Performed at [facility], [address], [place], [place] [address]  C-reactive protein     Status: Abnormal   Collection Time: 07/10/20 12:39 AM  Result Value Ref Range   CRP 14.9 (H) <1.0 mg/dL    Comment: Performed at [facility] [address] [place], [place] [address]  Glucose, capillary     Status: None   Collection Time: 07/10/20 12:48 AM  Result Value Ref Range   Glucose-Capillary 71 70 - 99 mg/dL    Comment: Glucose reference range applies only to samples taken after fasting for at least 8 hours.  Glucose, capillary     Status: None   Collection Time: 07/10/20  2:26 AM  Result Value Ref Range   Glucose-Capillary 73 70 - 99 mg/dL    Comment: Glucose reference range applies only to samples taken after fasting for at least 8 hours.  Basic metabolic panel     Status: Abnormal   Collection Time: 07/10/20  2:27 AM  Result Value Ref  Range   Sodium 144 135 - 145 mmol/L   Potassium 3.7 3.5 - 5.1 mmol/L   Chloride 119 (H) 98 - 111 mmol/L   CO2 17 (  L) 22 - 32 mmol/L   Glucose, Bld 79 70 - 99 mg/dL    Comment: Glucose reference range applies only to samples taken after fasting for at least 8 hours.   BUN 66 (H) 8 - 23 mg/dL   Creatinine, Ser 1.66 (H) 0.61 - 1.24 mg/dL    Comment: DELTA CHECK NOTED RESULTS VERIFIED VIA RECOLLECT    Calcium 8.4 (L) 8.9 - 10.3 mg/dL   GFR calc non Af Amer 35 (L) >60 mL/min   GFR calc Af Amer 41 (L) >60 mL/min   Anion gap 8 5 - 15    Comment: Performed at [facility] [address], [place], [place] [address]  Hepatic function panel     Status: Abnormal   Collection Time: 07/10/20  2:27 AM  Result Value Ref Range   Total Protein 5.3 (L) 6.5 - 8.1 g/dL   Albumin 2.3 (L) 3.5 - 5.0 g/dL   AST 14 (L) 15 - 41 U/L   ALT 18 0 - 44 U/L   Alkaline Phosphatase 92 38 - 126 U/L   Total Bilirubin 1.0 0.3 - 1.2 mg/dL   Bilirubin, Direct 0.2 0.0 - 0.2 mg/dL   Indirect Bilirubin 0.8 0.3 - 0.9 mg/dL    Comment: Performed at [facility] [address] [place], [place] [address]  Magnesium     Status: None   Collection Time: 07/10/20  2:27 AM  Result Value Ref Range   Magnesium 2.0 1.7 - 2.4 mg/dL    Comment: Performed at [facility] [address] [place], [place] [address]  Glucose, capillary     Status: None   Collection Time: 07/10/20  3:39 AM  Result Value Ref Range   Glucose-Capillary 70 70 - 99 mg/dL    Comment: Glucose reference range applies only to samples taken after fasting for at least 8 hours.  Glucose, capillary     Status: None   Collection Time: 07/10/20  8:13 AM  Result Value Ref Range   Glucose-Capillary 71 70 - 99 mg/dL    Comment: Glucose reference range applies only to samples taken after fasting for at least 8 hours.  Vancomycin, random     Status: None   Collection Time: 07/10/20 12:05 PM  Result Value Ref Range   Vancomycin Rm 6     Comment:         Random Vancomycin therapeutic range is dependent on dosage and time of specimen collection. A peak range is 20.0-40.0 ug/mL A trough range is 5.0-15.0 ug/mL        Performed at [facility] [address], [place], [place] [address]   MRSA PCR Screening     Status: None   Collection Time: 07/10/20  3:08 PM   Specimen: Nasal Mucosa; Nasopharyngeal  Result Value Ref Range   MRSA by PCR NEGATIVE NEGATIVE    Comment:        The GeneXpert MRSA Assay (FDA approved for NASAL specimens only), is one component of a comprehensive MRSA colonization surveillance program. It is not intended to diagnose MRSA infection nor to guide or monitor treatment for MRSA infections. Performed at [facility], [place] [address] [place], [place] [address]   Glucose, capillary     Status: None   Collection Time: 07/10/20  5:23 PM  Result Value Ref Range   Glucose-Capillary 71 70 - 99 mg/dL    Comment: Glucose reference range applies only to samples taken  after fasting for at least 8 hours.  Glucose, capillary     Status: Abnormal   Collection Time: 07/10/20  7:32 PM  Result Value Ref Range   Glucose-Capillary 62 (L) 70 - 99 mg/dL    Comment: Glucose reference range applies only to samples taken after fasting for at least 8 hours.  Glucose, capillary     Status: Abnormal   Collection Time: 07/10/20  9:17 PM  Result Value Ref Range   Glucose-Capillary 103 (H) 70 - 99 mg/dL    Comment: Glucose reference range applies only to samples taken after fasting for at least 8 hours.  Glucose, capillary     Status: None   Collection Time: 07/11/20 12:13 AM  Result Value Ref Range   Glucose-Capillary 73 70 - 99 mg/dL    Comment: Glucose reference range applies only to samples taken after fasting for at least 8 hours.  Glucose, capillary     Status: None   Collection Time: 07/11/20  4:19 AM  Result Value Ref Range   Glucose-Capillary 73 70 - 99 mg/dL    Comment: Glucose reference range  applies only to samples taken after fasting for at least 8 hours.  Basic metabolic panel     Status: Abnormal   Collection Time: 07/11/20  5:05 AM  Result Value Ref Range   Sodium 147 (H) 135 - 145 mmol/L   Potassium 3.6 3.5 - 5.1 mmol/L   Chloride 122 (H) 98 - 111 mmol/L   CO2 16 (L) 22 - 32 mmol/L   Glucose, Bld 80 70 - 99 mg/dL    Comment: Glucose reference range applies only to samples taken after fasting for at least 8 hours.   BUN 46 (H) 8 - 23 mg/dL   Creatinine, Ser 1.52 (H) 0.61 - 1.24 mg/dL   Calcium 8.8 (L) 8.9 - 10.3 mg/dL   GFR calc non Af Amer 39 (L) >60 mL/min   GFR calc Af Amer 46 (L) >60 mL/min   Anion gap 9 5 - 15    Comment: Performed at [facility] [address] [place], [place] [address]  CBC with Differential     Status: Abnormal   Collection Time: 07/11/20  5:05 AM  Result Value Ref Range   WBC 8.6 4.0 - 10.5 K/uL   RBC 2.68 (L) 4.22 - 5.81 MIL/uL   Hemoglobin 7.6 (L) 13.0 - 17.0 g/dL   HCT 24.1 (L) 39 - 52 %   MCV 89.9 80.0 - 100.0 fL   MCH 28.4 26.0 - 34.0 pg   MCHC 31.5 30.0 - 36.0 g/dL   RDW 15.0 11.5 - 15.5 %   Platelets 268 150 - 400 K/uL   nRBC 0.0 0.0 - 0.2 %   Neutrophils Relative % 70 %   Neutro Abs 6.0 1.7 - 7.7 K/uL   Lymphocytes Relative 19 %   Lymphs Abs 1.6 0.7 - 4.0 K/uL   Monocytes Relative 8 %   Monocytes Absolute 0.7 0 - 1 K/uL   Eosinophils Relative 1 %   Eosinophils Absolute 0.1 0 - 0 K/uL   Basophils Relative 0 %   Basophils Absolute 0.0 0 - 0 K/uL   Immature Granulocytes 2 %   Abs Immature Granulocytes 0.16 (H) 0.00 - 0.07 K/uL    Comment: Performed at [facility] [address], [place], [place] [address]  Hepatic function panel     Status: Abnormal   Collection Time: 07/11/20  5:05 AM  Result Value Ref Range   Total Protein 5.5 (L) 6.5 - 8.1 g/dL   Albumin 2.5 (L) 3.5 - 5.0 g/dL   AST 12 (L) 15 - 41 U/L   ALT 17 0 - 44 U/L   Alkaline Phosphatase 89 38 - 126 U/L   Total Bilirubin 1.3 (H) 0.3 - 1.2  mg/dL   Bilirubin, Direct 0.3 (H) 0.0 - 0.2 mg/dL   Indirect Bilirubin 1.0 (H) 0.3 - 0.9 mg/dL    Comment: Performed at [facility] [address] [place], [place] [address]  Glucose, capillary     Status: Abnormal   Collection Time: 07/11/20  7:24 AM  Result Value Ref Range   Glucose-Capillary 65 (L) 70 - 99 mg/dL    Comment: Glucose reference range applies only to samples taken after fasting for at least 8 hours.  Glucose, capillary     Status: None   Collection Time: 07/11/20 11:30 AM  Result Value Ref Range   Glucose-Capillary 91 70 - 99 mg/dL    Comment: Glucose reference range applies only to samples taken after fasting for at least 8 hours.  Glucose, capillary     Status: None   Collection Time: 07/11/20  4:31 PM  Result Value Ref Range   Glucose-Capillary 94 70 - 99 mg/dL    Comment: Glucose reference range applies only to samples taken after fasting for at least 8 hours.    DG Swallowing Func-Speech Pathology  Result Date: 07/11/2020 Objective Swallowing Evaluation: Type of Study: MBS-Modified Barium Swallow Study  Patient Details Name: TOWNES FUHS MRN: [id number] Date of Birth: June 15, 1926 Today's Date: 07/11/2020 Time: SLP Start Time (ACUTE ONLY): 0901 -SLP Stop Time (ACUTE ONLY): 0917 SLP Time Calculation (min) (ACUTE ONLY): 16 min Past Medical History: Past Medical History: Diagnosis Date . Anxiety   takes Atrivan daily . Back pain   chronic with neck pain . Cancer ([place])   skin . CHF (congestive heart failure) ([place])  . Constipation   related to medication . Dementia ([place])   takes Aricept nightly . Enlarged prostate   self caths once every 1-2wks . GERD (gastroesophageal reflux disease)   doesn't require medication . Hx of seasonal allergies  . Hyperlipidemia   takes Simvastatin daily . Hypertension   takes atenolol daily . Hypothyroidism   takes Synthroid daily . Insomnia   related to pain . MRSA (methicillin resistant staph aureus) culture positive   Per patient tested on  10/07/11. . Osteoporosis  . Peripheral edema   takes Torsemide daily . Scoliosis  Past Surgical History: Past Surgical History: Procedure Laterality Date . abdominal cyst    removed-2012;MRSA done by Dr.Gross . CATARACT EXTRACTION   . cataracts    bilateral . CERVICAL DISC SURGERY    x1 . COLONOSCOPY   . ESOPHAGOGASTRODUODENOSCOPY   . HERNIA REPAIR    Right inguinal . INCISION AND DRAINAGE OF WOUND N/A 07/09/2020  Procedure: IRRIGATION AND DEBRIDEMENT SCROTUM AND PERINEUM;  Surgeon: Rolm Bookbinder, MD;  Location: [facility];  Service: General;  Laterality: N/A; . INCISION AND DRAINAGE OF WOUND N/A 07/09/2020  Procedure: DEBRIDEMENT OF SCROTUM;  Surgeon: Robley Fries, MD;  Location: [facility];  Service: Urology;  Laterality: N/A; . INTRAMEDULLARY (IM) NAIL INTERTROCHANTERIC Left 10/12/2018 . INTRAMEDULLARY (IM) NAIL INTERTROCHANTERIC Left 10/12/2018  Procedure: INTRAMEDULLARY (IM) NAIL INTERTROCHANTRIC;  Surgeon: Renette Butters, MD;  Location: [facility];  Service: Orthopedics;  Laterality: Left; . LUMBAR DISC SURGERY    x2 . TOE SURGERY  right great toe . TRANSURETHRAL RESECTION OF PROSTATE    15+yrs ago HPI: Wayne [name] is a 86 y.o. male with medical history significant of cervical fusion, anxiety, dementia, chronic back and neck pain, unspecified skin cancer, chronic diastolic CHF, constipation, BPH, GERD, hyperlipidemia, hypertension, hypothyroidism, peripheral edema, scoliosis, admitted with scrotal edema and erythema. Underwent debridement due to severe perineal infection (necrotizing sof tissue infection) with postoperative shock. Suspected to have chronic aspiration. Palliative care meeting with son 9/28. CXR No acute disease  No data recorded Assessment / Plan / Recommendation CHL IP CLINICAL IMPRESSIONS 07/11/2020 Clinical Impression Pt has acute on chronic oropharyngeal dysphagia which he has managed well until past several weeks leading up to current illness. Overall, general deconditioning and evidence of  cervical fusion contribute to worsening dysphagia. Impairments observed in timing of laryngeal closure with delayed epiglottic deflection resulting in penetration before the swallow, during falling slightly below vocal cords with thin via straw (above cords with cup). Residue retained in vallecule mild, increased with solid. Esophageal scan unremarkable. Recommend nectar thick liquids, water protocol, Dys 2 texture, straws allowed, 2 swallows and full supervision and assist with meals. Pt may wish to return to regular diet/thin liquids if condiiton contiues to deteriorate. He is followed by Palliative care. ST will follow.     SLP Visit Diagnosis Dysphagia, pharyngeal phase (R13.13) Attention and concentration deficit following -- Frontal lobe and executive function deficit following -- Impact on safety and function Moderate aspiration risk;Severe aspiration risk   CHL IP TREATMENT RECOMMENDATION 07/11/2020 Treatment Recommendations Therapy as outlined in treatment plan below   Prognosis 07/11/2020 Prognosis for Safe Diet Advancement Fair Barriers to Reach Goals Cognitive deficits Barriers/Prognosis Comment -- CHL IP DIET RECOMMENDATION 07/11/2020 SLP Diet Recommendations Dysphagia 2 (Fine chop) solids;Nectar thick liquid Liquid Administration via Cup;Straw Medication Administration Whole meds with puree Compensations Slow rate;Small sips/bites;Clear throat intermittently;Multiple dry swallows after each bite/sip Postural Changes Seated upright at 90 degrees   CHL IP OTHER RECOMMENDATIONS 07/11/2020 Recommended Consults -- Oral Care Recommendations Oral care BID Other Recommendations Order thickener from pharmacy   CHL IP FOLLOW UP RECOMMENDATIONS 07/11/2020 Follow up Recommendations Skilled Nursing facility   [facility] IP FREQUENCY AND DURATION 07/11/2020 Speech Therapy Frequency (ACUTE ONLY) min 2x/week Treatment Duration 2 weeks      CHL IP ORAL PHASE 07/11/2020 Oral Phase Impaired Oral - Pudding Teaspoon -- Oral - Pudding  Cup -- Oral - Honey Teaspoon -- Oral - Honey Cup -- Oral - Nectar Teaspoon -- Oral - Nectar Cup Right pocketing in lateral sulci;Left pocketing in lateral sulci Oral - Nectar Straw -- Oral - Thin Teaspoon -- Oral - Thin Cup WFL Oral - Thin Straw WFL Oral - Puree -- Oral - Mech Soft Lingual/palatal residue Oral - Regular -- Oral - Multi-Consistency -- Oral - Pill -- Oral Phase - Comment --  CHL IP PHARYNGEAL PHASE 07/11/2020 Pharyngeal Phase Impaired Pharyngeal- Pudding Teaspoon -- Pharyngeal -- Pharyngeal- Pudding Cup -- Pharyngeal -- Pharyngeal- Honey Teaspoon -- Pharyngeal -- Pharyngeal- Honey Cup -- Pharyngeal -- Pharyngeal- Nectar Teaspoon -- Pharyngeal -- Pharyngeal- Nectar Cup Pharyngeal residue - valleculae Pharyngeal -- Pharyngeal- Nectar Straw -- Pharyngeal -- Pharyngeal- Thin Teaspoon -- Pharyngeal -- Pharyngeal- Thin Cup Penetration/Aspiration during swallow;Pharyngeal residue - valleculae Pharyngeal Material enters airway, remains ABOVE vocal cords and not ejected out Pharyngeal- Thin Straw Penetration/Aspiration before swallow;Penetration/Aspiration during swallow Pharyngeal Material enters airway, CONTACTS cords and not ejected out;Material enters airway, passes BELOW cords then ejected out Pharyngeal- Puree -- Pharyngeal --  Pharyngeal- Mechanical Soft Pharyngeal residue - valleculae Pharyngeal -- Pharyngeal- Regular -- Pharyngeal -- Pharyngeal- Multi-consistency -- Pharyngeal -- Pharyngeal- Pill -- Pharyngeal -- Pharyngeal Comment --  CHL IP CERVICAL ESOPHAGEAL PHASE 07/11/2020 Cervical Esophageal Phase WFL Pudding Teaspoon -- Pudding Cup -- Honey Teaspoon -- Honey Cup -- Nectar Teaspoon -- Nectar Cup -- Nectar Straw -- Thin Teaspoon -- Thin Cup -- Thin Straw -- Puree -- Mechanical Soft -- Regular -- Multi-consistency -- Pill -- Cervical Esophageal Comment -- [name] 07/11/2020, 10:37 AM [name] Colvin Caroli.Ed Actor Pager [phone] Office [phone]                Review of Systems  Constitutional: Negative for chills and fever.  Respiratory: Negative for shortness of breath.   Cardiovascular: Negative for chest pain.  Gastrointestinal: Negative for nausea and vomiting.  Genitourinary:       Reports pain well controlled   Blood pressure 112/61, pulse 83, temperature 97.7 F (36.5 C), temperature source Oral, resp. rate 16, height 5' 7.5" (1.715 m), weight 87.6 kg, SpO2 97 %. Physical Exam Constitutional:      General: He is not in acute distress.    Appearance: He is underweight.     Comments: Patient appears drowsy often closing eyes as we speak, but responds to questions with brief but appropriate answers. Chronically ill appearing.  HENT:     Head: Normocephalic and atraumatic.  Pulmonary:     Effort: Pulmonary effort is normal.  Genitourinary:    Comments: Moist to Dry wound dressing changed by urology today. Psychiatric:        Behavior: Behavior is cooperative.     Assessment/Plan: Chart reviewed and Dr. Claudia Desanctis discussed with Urology. Agree with continued moist to dry dressing changes. Plan to take patient to OR next week to apply wound matrix mesh.  Discussed proposed plan with patient and his son. They are in agreement with proposed plan.    Threasa Heads, PA-C 07/11/2020, 5:56 PM

## 2020-07-11 NOTE — Progress Notes (Addendum)
Initial Nutrition Assessment  DOCUMENTATION CODES:   Not applicable  INTERVENTION:  Monitor for nutrition plan; will provide supplement recommendations after MBS as appropriate.  Addendum @ 8592: Dysphagia 2 diet with nectar thick liquids ordered s/p MBS. RD will order MVI daily and Vital Cuisine shake po TID with meals, each supplement provides 500 kcal and 22 grams of protein   NUTRITION DIAGNOSIS:   Increased nutrient needs related to wound healing as evidenced by estimated needs.    GOAL:   Patient will meet greater than or equal to 90% of their needs    MONITOR:   Weight trends, Labs, Skin, I & O's  REASON FOR ASSESSMENT:      MST  ASSESSMENT:   Pt admitted with necrotizing fasciitis of scrotum and perineum. Pt also with UTI. PMH includes anxiety, dementia, skin cancer, CHF, BPH, GERD, HLD, HTN, hypothyroidism, osteoporosis, and scoliosis.  9/28 s/p exploration, washout and debridement of necrotic scrotum and perineum  Pt unavailable at time of RD visit. Pt having MBS due to suspicion for chronic aspiration.   Palliative Medicine met with pt. Noted that pt has expressed he would not want a feeding tube.  No recent wt history available for review; however, pt at highest documented wt.   Labs: Na 147(H) Medications: remeron IVF: LR @ 62m/hr  Diet Order:   Diet Order            DIET DYS 2 Room service appropriate? No; Fluid consistency: Nectar Thick  Diet effective now                 EDUCATION NEEDS:   No education needs have been identified at this time  Skin:  Skin Assessment: Skin Integrity Issues: Skin Integrity Issues:: Stage I, Incisions, Other (Comment) Stage I: R lumbar Incisions: perineum Other: non-pressure wound R great toe  Last BM:  PTA  Height:   Ht Readings from Last 1 Encounters:  07/11/20 5' 7.5" (1.715 m)    Weight:   Wt Readings from Last 10 Encounters:  07/11/20 87.6 kg  10/11/18 66.2 kg  10/27/17 67.1 kg   10/20/17 67.1 kg  11/18/16 72.4 kg  08/10/16 70.9 kg  11/29/15 74.9 kg  11/11/15 74.8 kg  04/12/14 81.2 kg  02/18/12 81.6 kg    BMI:  Body mass index is 29.8 kg/m.  Estimated Nutritional Needs:   Kcal:  2000-2200  Protein:  100-110 grams  Fluid:  >/=2L/d    ALarkin Ina MS, RD, LDN RD pager number and weekend/on-call pager number located in [phone]

## 2020-07-12 DIAGNOSIS — N492 Inflammatory disorders of scrotum: Secondary | ICD-10-CM | POA: Diagnosis not present

## 2020-07-12 LAB — CBC WITH DIFFERENTIAL/PLATELET
Abs Immature Granulocytes: 0.16 10*3/uL — ABNORMAL HIGH (ref 0.00–0.07)
Basophils Absolute: 0 10*3/uL (ref 0.0–0.1)
Basophils Relative: 0 %
Eosinophils Absolute: 0.1 10*3/uL (ref 0.0–0.5)
Eosinophils Relative: 1 %
HCT: 24.5 % — ABNORMAL LOW (ref 39.0–52.0)
Hemoglobin: 7.9 g/dL — ABNORMAL LOW (ref 13.0–17.0)
Immature Granulocytes: 2 %
Lymphocytes Relative: 17 %
Lymphs Abs: 1.4 10*3/uL (ref 0.7–4.0)
MCH: 28.8 pg (ref 26.0–34.0)
MCHC: 32.2 g/dL (ref 30.0–36.0)
MCV: 89.4 fL (ref 80.0–100.0)
Monocytes Absolute: 0.7 10*3/uL (ref 0.1–1.0)
Monocytes Relative: 9 %
Neutro Abs: 5.8 10*3/uL (ref 1.7–7.7)
Neutrophils Relative %: 71 %
Platelets: 264 10*3/uL (ref 150–400)
RBC: 2.74 MIL/uL — ABNORMAL LOW (ref 4.22–5.81)
RDW: 15.4 % (ref 11.5–15.5)
WBC: 8.1 10*3/uL (ref 4.0–10.5)
nRBC: 0 % (ref 0.0–0.2)

## 2020-07-12 LAB — BASIC METABOLIC PANEL
Anion gap: 9 (ref 5–15)
BUN: 32 mg/dL — ABNORMAL HIGH (ref 8–23)
CO2: 18 mmol/L — ABNORMAL LOW (ref 22–32)
Calcium: 8.6 mg/dL — ABNORMAL LOW (ref 8.9–10.3)
Chloride: 123 mmol/L — ABNORMAL HIGH (ref 98–111)
Creatinine, Ser: 1.22 mg/dL (ref 0.61–1.24)
GFR calc Af Amer: 60 mL/min — ABNORMAL LOW (ref >60)
GFR calc non Af Amer: 52 mL/min — ABNORMAL LOW (ref >60)
Glucose, Bld: 105 mg/dL — ABNORMAL HIGH (ref 70–99)
Potassium: 3.2 mmol/L — ABNORMAL LOW (ref 3.5–5.1)
Sodium: 150 mmol/L — ABNORMAL HIGH (ref 135–145)

## 2020-07-12 LAB — GLUCOSE, CAPILLARY
Glucose-Capillary: 103 mg/dL — ABNORMAL HIGH (ref 70–99)
Glucose-Capillary: 103 mg/dL — ABNORMAL HIGH (ref 70–99)
Glucose-Capillary: 89 mg/dL (ref 70–99)
Glucose-Capillary: 96 mg/dL (ref 70–99)

## 2020-07-12 MED ORDER — DEXTROSE 5 % IV SOLN: INTRAVENOUS | Status: DC

## 2020-07-12 MED ORDER — POTASSIUM CL IN DEXTROSE 5% 20 MEQ/L IV SOLN
20.0000 meq | INTRAVENOUS | Status: DC
  Administered 2020-07-12: 20 meq via INTRAVENOUS
  Filled 2020-07-12: qty 1000

## 2020-07-12 NOTE — Progress Notes (Signed)
3 Days Post-Op   Subjective/Chief Complaint: Out of the unit. Arouses to voice and states he doesn't feel well but is unable to describe. Asking to drink liquids  MBS scheduled for 0900  Afebrile, Hr and BP are normal, leukocytosis resolved (8.6) Creatinine 1.58 from 1.66 Objective: Vital signs in last 24 hours: Temp:  [97.7 F (36.5 C)-98.6 F (37 C)] 98.6 F (37 C) (10/01 0526) Pulse Rate:  [81-83] 81 (10/01 0526) Resp:  [16-22] 22 (10/01 0526) BP: (110-131)/(61-75) 131/70 (10/01 0526) SpO2:  [96 %-100 %] 96 % (10/01 0526) Weight:  [58.7 kg] 58.7 kg (09/30 2058) Last BM Date:  (PTA)  Intake/Output from previous day: 09/30 0701 - 10/01 0700 In: 2809.7 [P.O.:300; I.V.:1909.7; IV Piggyback:600] Out: 4081 [Urine:1375] Intake/Output this shift: No intake/output data recorded.  General: chronically ill appearing white male laying in bed, NAD HEENT: head -normocephalic, atraumatic;  Neck- Trachea is midline CV- RRR Pulm- breathing is non-labored Abd- soft, NT/ND GU- see below -- slight increase in fibrinopurulent exudate under left testes and left perineum. No necrosis. No induration, cellulitis improved.     MSK- UE/LE symmetrical  Lab Results:  Recent Labs    07/10/20 0039 07/11/20 0505  WBC 6.1 8.6  HGB 7.3* 7.6*  HCT 23.0* 24.1*  PLT 208 268   BMET Recent Labs    07/10/20 0227 07/11/20 0505  NA 144 147*  K 3.7 3.6  CL 119* 122*  CO2 17* 16*  GLUCOSE 79 80  BUN 66* 46*  CREATININE 1.66* 1.52*  CALCIUM 8.4* 8.8*   PT/INR No results for input(s): LABPROT, INR in the last 72 hours. ABG No results for input(s): PHART, HCO3 in the last 72 hours.  Invalid input(s): PCO2, PO2  Studies/Results: DG Swallowing Func-Speech Pathology  Result Date: 07/11/2020 Objective Swallowing Evaluation: Type of Study: MBS-Modified Barium Swallow Study  Patient Details Name: Wayne [name] MRN: [id number] Date of Birth: [birth year] Today's Date: 07/11/2020 Time: SLP Start  Time (ACUTE ONLY): 0901 -SLP Stop Time (ACUTE ONLY): 0917 SLP Time Calculation (min) (ACUTE ONLY): 16 min Past Medical History: Past Medical History: Diagnosis Date . Anxiety   takes Atrivan daily . Back pain   chronic with neck pain . Cancer ([place])   skin . CHF (congestive heart failure) ([place])  . Constipation   related to medication . Dementia ([place])   takes Aricept nightly . Enlarged prostate   self caths once every 1-2wks . GERD (gastroesophageal reflux disease)   doesn't require medication . Hx of seasonal allergies  . Hyperlipidemia   takes Simvastatin daily . Hypertension   takes atenolol daily . Hypothyroidism   takes Synthroid daily . Insomnia   related to pain . MRSA (methicillin resistant staph aureus) culture positive   Per patient tested on 10/07/11. . Osteoporosis  . Peripheral edema   takes Torsemide daily . Scoliosis  Past Surgical History: Past Surgical History: Procedure Laterality Date . abdominal cyst    removed-2012;MRSA done by Dr.Gross . CATARACT EXTRACTION   . cataracts    bilateral . CERVICAL DISC SURGERY    x1 . COLONOSCOPY   . ESOPHAGOGASTRODUODENOSCOPY   . HERNIA REPAIR    Right inguinal . INCISION AND DRAINAGE OF WOUND N/A 07/09/2020  Procedure: IRRIGATION AND DEBRIDEMENT SCROTUM AND PERINEUM;  Surgeon: Rolm Bookbinder, MD;  Location: [place];  Service: General;  Laterality: N/A; . INCISION AND DRAINAGE OF WOUND N/A 07/09/2020  Procedure: DEBRIDEMENT OF SCROTUM;  Surgeon: Robley Fries, MD;  Location: [place];  Service:  Urology;  Laterality: N/A; . INTRAMEDULLARY (IM) NAIL INTERTROCHANTERIC Left 10/12/2018 . INTRAMEDULLARY (IM) NAIL INTERTROCHANTERIC Left 10/12/2018  Procedure: INTRAMEDULLARY (IM) NAIL INTERTROCHANTRIC;  Surgeon: Renette Butters, MD;  Location: [place];  Service: Orthopedics;  Laterality: Left; . LUMBAR DISC SURGERY    x2 . TOE SURGERY    right great toe . TRANSURETHRAL RESECTION OF PROSTATE    15+yrs ago HPI: Wayne [name] is a 86 y.o. male with medical history significant  of cervical fusion, anxiety, dementia, chronic back and neck pain, unspecified skin cancer, chronic diastolic CHF, constipation, BPH, GERD, hyperlipidemia, hypertension, hypothyroidism, peripheral edema, scoliosis, admitted with scrotal edema and erythema. Underwent debridement due to severe perineal infection (necrotizing sof tissue infection) with postoperative shock. Suspected to have chronic aspiration. Palliative care meeting with son 9/28. CXR No acute disease  No data recorded Assessment / Plan / Recommendation CHL IP CLINICAL IMPRESSIONS 07/11/2020 Clinical Impression Pt has acute on chronic oropharyngeal dysphagia which he has managed well until past several weeks leading up to current illness. Overall, general deconditioning and evidence of cervical fusion contribute to worsening dysphagia. Impairments observed in timing of laryngeal closure with delayed epiglottic deflection resulting in penetration before the swallow, during falling slightly below vocal cords with thin via straw (above cords with cup). Residue retained in vallecule mild, increased with solid. Esophageal scan unremarkable. Recommend nectar thick liquids, water protocol, Dys 2 texture, straws allowed, 2 swallows and full supervision and assist with meals. Pt may wish to return to regular diet/thin liquids if condiiton contiues to deteriorate. He is followed by Palliative care. ST will follow.     SLP Visit Diagnosis Dysphagia, pharyngeal phase (R13.13) Attention and concentration deficit following -- Frontal lobe and executive function deficit following -- Impact on safety and function Moderate aspiration risk;Severe aspiration risk   CHL IP TREATMENT RECOMMENDATION 07/11/2020 Treatment Recommendations Therapy as outlined in treatment plan below   Prognosis 07/11/2020 Prognosis for Safe Diet Advancement Fair Barriers to Reach Goals Cognitive deficits Barriers/Prognosis Comment -- CHL IP DIET RECOMMENDATION 07/11/2020 SLP Diet Recommendations  Dysphagia 2 (Fine chop) solids;Nectar thick liquid Liquid Administration via Cup;Straw Medication Administration Whole meds with puree Compensations Slow rate;Small sips/bites;Clear throat intermittently;Multiple dry swallows after each bite/sip Postural Changes Seated upright at 90 degrees   CHL IP OTHER RECOMMENDATIONS 07/11/2020 Recommended Consults -- Oral Care Recommendations Oral care BID Other Recommendations Order thickener from pharmacy   CHL IP FOLLOW UP RECOMMENDATIONS 07/11/2020 Follow up Recommendations Skilled Nursing facility   [facility] IP FREQUENCY AND DURATION 07/11/2020 Speech Therapy Frequency (ACUTE ONLY) min 2x/week Treatment Duration 2 weeks      CHL IP ORAL PHASE 07/11/2020 Oral Phase Impaired Oral - Pudding Teaspoon -- Oral - Pudding Cup -- Oral - Honey Teaspoon -- Oral - Honey Cup -- Oral - Nectar Teaspoon -- Oral - Nectar Cup Right pocketing in lateral sulci;Left pocketing in lateral sulci Oral - Nectar Straw -- Oral - Thin Teaspoon -- Oral - Thin Cup WFL Oral - Thin Straw WFL Oral - Puree -- Oral - Mech Soft Lingual/palatal residue Oral - Regular -- Oral - Multi-Consistency -- Oral - Pill -- Oral Phase - Comment --  CHL IP PHARYNGEAL PHASE 07/11/2020 Pharyngeal Phase Impaired Pharyngeal- Pudding Teaspoon -- Pharyngeal -- Pharyngeal- Pudding Cup -- Pharyngeal -- Pharyngeal- Honey Teaspoon -- Pharyngeal -- Pharyngeal- Honey Cup -- Pharyngeal -- Pharyngeal- Nectar Teaspoon -- Pharyngeal -- Pharyngeal- Nectar Cup Pharyngeal residue - valleculae Pharyngeal -- Pharyngeal- Nectar Straw -- Pharyngeal -- Pharyngeal- Thin Teaspoon --  Pharyngeal -- Pharyngeal- Thin Cup Penetration/Aspiration during swallow;Pharyngeal residue - valleculae Pharyngeal Material enters airway, remains ABOVE vocal cords and not ejected out Pharyngeal- Thin Straw Penetration/Aspiration before swallow;Penetration/Aspiration during swallow Pharyngeal Material enters airway, CONTACTS cords and not ejected out;Material enters airway,  passes BELOW cords then ejected out Pharyngeal- Puree -- Pharyngeal -- Pharyngeal- Mechanical Soft Pharyngeal residue - valleculae Pharyngeal -- Pharyngeal- Regular -- Pharyngeal -- Pharyngeal- Multi-consistency -- Pharyngeal -- Pharyngeal- Pill -- Pharyngeal -- Pharyngeal Comment --  CHL IP CERVICAL ESOPHAGEAL PHASE 07/11/2020 Cervical Esophageal Phase WFL Pudding Teaspoon -- Pudding Cup -- Honey Teaspoon -- Honey Cup -- Nectar Teaspoon -- Nectar Cup -- Nectar Straw -- Thin Teaspoon -- Thin Cup -- Thin Straw -- Puree -- Mechanical Soft -- Regular -- Multi-consistency -- Pill -- Cervical Esophageal Comment -- [name] 07/11/2020, 10:37 AM [name] Colvin Caroli.Ed Actor Pager [phone] Office [phone]               Anti-infectives: Anti-infectives (From admission, onward)   Start     Dose/Rate Route Frequency Ordered Stop   07/10/20 1700  vancomycin (VANCOREADY) IVPB 750 mg/150 mL        750 mg 150 mL/hr over 60 Minutes Intravenous Every 24 hours 07/10/20 1605     07/10/20 1500  vancomycin (VANCOREADY) IVPB 750 mg/150 mL  Status:  Discontinued        750 mg 150 mL/hr over 60 Minutes Intravenous  Once 07/10/20 1358 07/10/20 1605   07/09/20 2000  ceFEPIme (MAXIPIME) 2 g in sodium chloride 0.9 % 100 mL IVPB        2 g 200 mL/hr over 30 Minutes Intravenous Every 24 hours 07/08/20 1955     07/09/20 0600  metroNIDAZOLE (FLAGYL) IVPB 500 mg        500 mg 100 mL/hr over 60 Minutes Intravenous Every 8 hours 07/09/20 0332     07/08/20 2309  vancomycin variable dose per unstable renal function (pharmacist dosing)  Status:  Discontinued         Does not apply See admin instructions 07/08/20 2309 07/10/20 1605   07/08/20 2215  metroNIDAZOLE (FLAGYL) IVPB 500 mg        500 mg 100 mL/hr over 60 Minutes Intravenous  Once 07/08/20 2207 07/08/20 2327   07/08/20 1945  ceFEPIme (MAXIPIME) 2 g in sodium chloride 0.9 % 100 mL IVPB        2 g 200 mL/hr over 30 Minutes  Intravenous  Once 07/08/20 1933 07/08/20 2033   07/08/20 1900  vancomycin (VANCOCIN) IVPB 1000 mg/200 mL premix        1,000 mg 200 mL/hr over 60 Minutes Intravenous  Once 07/08/20 1853 07/08/20 2220      Assessment/Plan: STI scrotum -afebrile, leukocytosis resolved (WBC 8.6 yesterday), hemodynamically stable - no acute general surgery needs,  I do not think further debridement of the perineam is warranted at this time. Urology following - plastics consulted, planning for application of wound matrix next week -cont abx - follow cultures (proteus mirabilis, group B strep) -cont dressing changes, right now written for moist-to-dry daily in AM. Perineum would benefit from more frequent dressing changes (see picture above with purulent exudate, particularly in left perineum/scrotal area). Will increase to BID.   Jill Alexanders 07/12/2020

## 2020-07-12 NOTE — Progress Notes (Signed)
PROGRESS NOTE    Wayne [name]  FGH:[id number] DOB: 21-Feb-1926 DOA: 07/08/2020 PCP: Isaac Bliss, Rayford Halsted, MD  Brief Narrative: Frail elderly 89/M from [facility], SNF w/ cognitive deficits, chronic diastolic CHF, severe protein calorie malnutrition, failure to thrive was admitted to the ICU on 9/27 with septic shock secondary to perineal necrotizing fasciitis. -Underwent extensive debridement in the OR by urology and general surgery, remained hypotensive in PACU, admitted to the ICU on vasopressors -Blood pressure improved, transferred to [facility] service  9/30   Assessment & Plan:   Septic shock  Necrotizing fasciitis of scrotum/perineum -Treated with broad-spectrum antibiotics, pressors, IV fluids and albumin -Remains on broad-spectrum antibiotics, day 5 today -Blood pressure has stabilized off pressors -Blood cultures are negative, Intra-Op cultures with rare group B strep, rare Proteus, will discontinue Flagyl today, continue IV vancomycin and cefepime, still with considerable purulent exudate -Discussed with urology and general surgery 9/30,  -Plastic surgery also following, plan for wound matrix mesh early next week -Palliative care also following, we are considering hospice at the time of discharge to SNF  Severe protein calorie malnutrition Adult failure to thrive Severe debility -Overall prognosis is poor -Palliative care following -Hospice also being considered at the time of discharge to SNF  Dysphagia -Acute on chronic -Discussed with SLP, will remain high aspiration risk, now on dysphagia 2 diet  -Oral intake remains very poor -Discussed poor prognosis with patient's son and recommended symptom/comfort focused care, hospice at discharge seems reasonable  Acute kidney injury -In the setting of sepsis and shock, improving -Decrease fluids to LR at 75 mL today  Moderate bilateral hydronephrosis Chronic bladder outlet obstruction Now urinating without issues,  creatinine improving,  -Creatinine down to 1.2 now -Continue Foley catheter  Indeterminate right lower lobe level lesion -Unclear etiology, not appropriate for additional work-up at this time  Chronic anemia -Monitor, not appropriate for extensive work-up  DVT prophylaxis: Heparin subcutaneous Code Status: DNR Family Communication: Discussed with son [name] at bedside 9/30 Disposition Plan:  Status is: Inpatient  Remains inpatient appropriate because:Inpatient level of care appropriate due to severity of illness   Dispo: The patient is from: SNF              Anticipated d/c is to: SNF              Anticipated d/c date is: > 3 days              Patient currently is not medically stable to d/c.  Consultants:   Urology, general surgery, PCCM  Plastic surgery   Procedures: Exploration, washout and debridement of necrotic scrotum for necrotizing fasciitis on 9/28  Antimicrobials:    Subjective: -Sleeping most of the time, very poor oral intake  Objective: Vitals:   07/11/20 1015 07/11/20 2058 07/12/20 0526 07/12/20 1008  BP: 112/61 110/75 131/70 (!) 146/83  Pulse: 83 81 81 78  Resp: 16 20 (!) 22 18  Temp: 97.7 F (36.5 C) 97.7 F (36.5 C) 98.6 F (37 C) (!) 97.5 F (36.4 C)  TempSrc: Oral Oral Oral Oral  SpO2: 97% 100% 96% 96%  Weight:  58.7 kg    Height:        Intake/Output Summary (Last 24 hours) at 07/12/2020 1425 Last data filed at 07/12/2020 0619 Gross per 24 hour  Intake 1412.03 ml  Output 1375 ml  Net 37.03 ml   Filed Weights   07/08/20 1900 07/11/20 0304 07/11/20 2058  Weight: 51.7 kg 87.6 kg 58.7 kg  Examination:  General exam: Frail elderly chronically ill male sitting up in bed, awake alert, oriented to self only and partly to place CVS: S1-S2, regular rate rhythm Lungs: Decreased breath sounds to bases, otherwise clear Abdomen: Soft, nontender, GU with scrotal dressing noted Neuro: Awake alert, pleasantly confused, able to move all  extremities, very deconditioned Extremities: 1+ edema in the ankles and feet  Skin: As above Psychiatry: Poor insight and judgment    Data Reviewed:   CBC: Recent Labs  Lab 07/08/20 1528 07/09/20 0412 07/10/20 0039 07/11/20 0505 07/12/20 1219  WBC 13.9* 12.0* 6.1 8.6 8.1  NEUTROABS 12.6* 9.6* 4.8 6.0 5.8  HGB 10.6* 8.7* 7.3* 7.6* 7.9*  HCT 33.3* 27.6* 23.0* 24.1* 24.5*  MCV 89.8 90.2 89.5 89.9 89.4  PLT 380 281 208 268 366   Basic Metabolic Panel: Recent Labs  Lab 07/08/20 1528 07/10/20 0227 07/11/20 0505 07/12/20 1219  NA 134* 144 147* 150*  K 5.2* 3.7 3.6 3.2*  CL 106 119* 122* 123*  CO2 16* 17* 16* 18*  GLUCOSE 114* 79 80 105*  BUN 126* 66* 46* 32*  CREATININE 3.23* 1.66* 1.52* 1.22  CALCIUM 8.9 8.4* 8.8* 8.6*  MG  --  2.0  --   --    GFR: Estimated Creatinine Clearance: 32.7 mL/min (by C-G formula based on SCr of 1.22 mg/dL). Liver Function Tests: Recent Labs  Lab 07/08/20 1528 07/09/20 0412 07/10/20 0227 07/11/20 0505  AST 44* 23 14* 12*  ALT 45* 33 18 17  ALKPHOS 213* 155* 92 89  BILITOT 0.6 0.7 1.0 1.3*  PROT 6.6 5.8* 5.3* 5.5*  ALBUMIN 2.0* 1.7* 2.3* 2.5*   No results for input(s): LIPASE, AMYLASE in the last 168 hours. No results for input(s): AMMONIA in the last 168 hours. Coagulation Profile: Recent Labs  Lab 07/08/20 1528 07/09/20 0412  INR 1.9* 1.9*   Cardiac Enzymes: No results for input(s): CKTOTAL, CKMB, CKMBINDEX, TROPONINI in the last 168 hours. BNP (last 3 results) No results for input(s): PROBNP in the last 8760 hours. HbA1C: No results for input(s): HGBA1C in the last 72 hours. CBG: Recent Labs  Lab 07/11/20 1130 07/11/20 1631 07/11/20 2100 07/12/20 0040 07/12/20 0752  GLUCAP 91 94 104* 103* 89   Lipid Profile: No results for input(s): CHOL, HDL, LDLCALC, TRIG, CHOLHDL, LDLDIRECT in the last 72 hours. Thyroid Function Tests: No results for input(s): TSH, T4TOTAL, FREET4, T3FREE, THYROIDAB in the last 72  hours. Anemia Panel: No results for input(s): VITAMINB12, FOLATE, FERRITIN, TIBC, IRON, RETICCTPCT in the last 72 hours. Urine analysis:    Component Value Date/Time   COLORURINE BROWN (A) 07/08/2020 2232   APPEARANCEUR TURBID (A) 07/08/2020 2232   LABSPEC 1.011 07/08/2020 2232   PHURINE 6.0 07/08/2020 2232   GLUCOSEU NEGATIVE 07/08/2020 2232   HGBUR LARGE (A) 07/08/2020 2232   BILIRUBINUR NEGATIVE 07/08/2020 2232   BILIRUBINUR n 11/11/2015 1716   KETONESUR NEGATIVE 07/08/2020 2232   PROTEINUR 100 (A) 07/08/2020 2232   UROBILINOGEN 0.2 11/11/2015 1716   UROBILINOGEN 0.2 02/15/2012 1335   NITRITE NEGATIVE 07/08/2020 2232   LEUKOCYTESUR MODERATE (A) 07/08/2020 2232   Sepsis Labs: @LABRCNTIP (procalcitonin:4,lacticidven:4)  ) Recent Results (from the past 240 hour(s))  Culture, blood (Routine x 2)     Status: None (Preliminary result)   Collection Time: 07/08/20  3:33 PM   Specimen: BLOOD  Result Value Ref Range Status   Specimen Description BLOOD RIGHT ANTECUBITAL  Final   Special Requests   Final  BOTTLES DRAWN AEROBIC AND ANAEROBIC Blood Culture adequate volume   Culture   Final    NO GROWTH 4 DAYS Performed at [facility], [place] [address], [place], [place] [address]    Report Status PENDING  Incomplete  Culture, blood (Routine x 2)     Status: None (Preliminary result)   Collection Time: 07/08/20  7:39 PM   Specimen: BLOOD  Result Value Ref Range Status   Specimen Description BLOOD LEFT ANTECUBITAL  Final   Special Requests   Final    BOTTLES DRAWN AEROBIC AND ANAEROBIC Blood Culture adequate volume   Culture   Final    NO GROWTH 4 DAYS Performed at [facility], [place] [address], [place],  [address]    Report Status PENDING  Incomplete  Respiratory Panel by RT PCR (Flu A&B, Covid) - Nasopharyngeal Swab     Status: None   Collection Time: 07/08/20 11:30 PM   Specimen: Nasopharyngeal Swab  Result Value Ref Range Status   SARS Coronavirus 2 by RT  PCR NEGATIVE NEGATIVE Final    Comment: (NOTE) SARS-CoV-2 target nucleic acids are NOT DETECTED.  The SARS-CoV-2 RNA is generally detectable in upper respiratoy specimens during the acute phase of infection. The lowest concentration of SARS-CoV-2 viral copies this assay can detect is 131 copies/mL. A negative result does not preclude SARS-Cov-2 infection and should not be used as the sole basis for treatment or other patient management decisions. A negative result may occur with  improper specimen collection/handling, submission of specimen other than nasopharyngeal swab, presence of viral mutation(s) within the areas targeted by this assay, and inadequate number of viral copies (<131 copies/mL). A negative result must be combined with clinical observations, patient history, and epidemiological information. The expected result is Negative.  Fact Sheet for Patients:  PinkCheek.be  Fact Sheet for Healthcare Providers:  GravelBags.it  This test is no t yet approved or cleared by the [place] FDA and  has been authorized for detection and/or diagnosis of SARS-CoV-2 by FDA under an Emergency Use Authorization (EUA). This EUA will remain  in effect (meaning this test can be used) for the duration of the COVID-19 declaration under Section 564(b)(1) of the Act, 21 U.S.C. section 360bbb-3(b)(1), unless the authorization is terminated or revoked sooner.     Influenza A by PCR NEGATIVE NEGATIVE Final   Influenza B by PCR NEGATIVE NEGATIVE Final    Comment: (NOTE) The Xpert Xpress SARS-CoV-2/FLU/RSV assay is intended as an aid in  the diagnosis of influenza from Nasopharyngeal swab specimens and  should not be used as a sole basis for treatment. Nasal washings and  aspirates are unacceptable for Xpert Xpress SARS-CoV-2/FLU/RSV  testing.  Fact Sheet for Patients: PinkCheek.be  Fact Sheet for  Healthcare Providers: GravelBags.it  This test is not yet approved or cleared by the [place] FDA and  has been authorized for detection and/or diagnosis of SARS-CoV-2 by  FDA under an Emergency Use Authorization (EUA). This EUA will remain  in effect (meaning this test can be used) for the duration of the  Covid-19 declaration under Section 564(b)(1) of the Act, 21  U.S.C. section 360bbb-3(b)(1), unless the authorization is  terminated or revoked. Performed at [facility], [place] [address], [place],  [address]   Aerobic/Anaerobic Culture (surgical/deep wound)     Status: None (Preliminary result)   Collection Time: 07/09/20  9:21 AM   Specimen: Abscess  Result Value Ref Range Status   Specimen  Description ABSCESS  Final   Special Requests NONE  Final   Gram Stain   Final    RARE WBC PRESENT,BOTH PMN AND MONONUCLEAR RARE GRAM POSITIVE COCCI IN PAIRS IN CLUSTERS Performed at [facility], [address], [place], [place] [address]    Culture   Final    RARE GROUP B STREP(S.AGALACTIAE)ISOLATED TESTING AGAINST S. AGALACTIAE NOT ROUTINELY PERFORMED DUE TO PREDICTABILITY OF AMP/PEN/VAN SUSCEPTIBILITY. RARE PROTEUS MIRABILIS NO ANAEROBES ISOLATED; CULTURE IN PROGRESS FOR 5 DAYS    Report Status PENDING  Incomplete   Organism ID, Bacteria PROTEUS MIRABILIS  Final      Susceptibility   Proteus mirabilis - MIC*    AMPICILLIN <=2 SENSITIVE Sensitive     CEFAZOLIN <=4 SENSITIVE Sensitive     CEFEPIME <=0.12 SENSITIVE Sensitive     CEFTAZIDIME <=1 SENSITIVE Sensitive     CEFTRIAXONE <=0.25 SENSITIVE Sensitive     CIPROFLOXACIN <=0.25 SENSITIVE Sensitive     GENTAMICIN <=1 SENSITIVE Sensitive     IMIPENEM 1 SENSITIVE Sensitive     TRIMETH/SULFA <=20 SENSITIVE Sensitive     AMPICILLIN/SULBACTAM <=2 SENSITIVE Sensitive     PIP/TAZO <=4 SENSITIVE Sensitive     * RARE PROTEUS MIRABILIS  MRSA PCR Screening     Status: None   Collection  Time: 07/10/20  3:08 PM   Specimen: Nasal Mucosa; Nasopharyngeal  Result Value Ref Range Status   MRSA by PCR NEGATIVE NEGATIVE Final    Comment:        The GeneXpert MRSA Assay (FDA approved for NASAL specimens only), is one component of a comprehensive MRSA colonization surveillance program. It is not intended to diagnose MRSA infection nor to guide or monitor treatment for MRSA infections. Performed at [facility], [place] [address], [place], [place] [address]          Radiology Studies: DG Swallowing Func-Speech Pathology  Result Date: 07/11/2020 Objective Swallowing Evaluation: Type of Study: MBS-Modified Barium Swallow Study  Patient Details Name: Wayne [name] MRN: [id number] Date of Birth: [birth year] Today's Date: 07/11/2020 Time: SLP Start Time (ACUTE ONLY): 0901 -SLP Stop Time (ACUTE ONLY): 0917 SLP Time Calculation (min) (ACUTE ONLY): 16 min Past Medical History: Past Medical History: Diagnosis Date . Anxiety   takes Atrivan daily . Back pain   chronic with neck pain . Cancer ([place])   skin . CHF (congestive heart failure) ([place])  . Constipation   related to medication . Dementia ([place])   takes Aricept nightly . Enlarged prostate   self caths once every 1-2wks . GERD (gastroesophageal reflux disease)   doesn't require medication . Hx of seasonal allergies  . Hyperlipidemia   takes Simvastatin daily . Hypertension   takes atenolol daily . Hypothyroidism   takes Synthroid daily . Insomnia   related to pain . MRSA (methicillin resistant staph aureus) culture positive   Per patient tested on 10/07/11. . Osteoporosis  . Peripheral edema   takes Torsemide daily . Scoliosis  Past Surgical History: Past Surgical History: Procedure Laterality Date . abdominal cyst    removed-2012;MRSA done by Dr.Gross . CATARACT EXTRACTION   . cataracts    bilateral . CERVICAL DISC SURGERY    x1 . COLONOSCOPY   . ESOPHAGOGASTRODUODENOSCOPY   . HERNIA REPAIR    Right inguinal . INCISION AND DRAINAGE OF WOUND  N/A 07/09/2020  Procedure: IRRIGATION AND DEBRIDEMENT SCROTUM AND PERINEUM;  Surgeon: Rolm Bookbinder, MD;  Location: [facility];  Service: General;  Laterality: N/A; . INCISION  AND DRAINAGE OF WOUND N/A 07/09/2020  Procedure: DEBRIDEMENT OF SCROTUM;  Surgeon: Robley Fries, MD;  Location: [facility];  Service: Urology;  Laterality: N/A; . INTRAMEDULLARY (IM) NAIL INTERTROCHANTERIC Left 10/12/2018 . INTRAMEDULLARY (IM) NAIL INTERTROCHANTERIC Left 10/12/2018  Procedure: INTRAMEDULLARY (IM) NAIL INTERTROCHANTRIC;  Surgeon: Renette Butters, MD;  Location: [facility];  Service: Orthopedics;  Laterality: Left; . LUMBAR DISC SURGERY    x2 . TOE SURGERY    right great toe . TRANSURETHRAL RESECTION OF PROSTATE    15+yrs ago HPI: Wayne [name] is a 86 y.o. male with medical history significant of cervical fusion, anxiety, dementia, chronic back and neck pain, unspecified skin cancer, chronic diastolic CHF, constipation, BPH, GERD, hyperlipidemia, hypertension, hypothyroidism, peripheral edema, scoliosis, admitted with scrotal edema and erythema. Underwent debridement due to severe perineal infection (necrotizing sof tissue infection) with postoperative shock. Suspected to have chronic aspiration. Palliative care meeting with son 9/28. CXR No acute disease  No data recorded Assessment / Plan / Recommendation CHL IP CLINICAL IMPRESSIONS 07/11/2020 Clinical Impression Pt has acute on chronic oropharyngeal dysphagia which he has managed well until past several weeks leading up to current illness. Overall, general deconditioning and evidence of cervical fusion contribute to worsening dysphagia. Impairments observed in timing of laryngeal closure with delayed epiglottic deflection resulting in penetration before the swallow, during falling slightly below vocal cords with thin via straw (above cords with cup). Residue retained in vallecule mild, increased with solid. Esophageal scan unremarkable. Recommend nectar thick liquids, water  protocol, Dys 2 texture, straws allowed, 2 swallows and full supervision and assist with meals. Pt may wish to return to regular diet/thin liquids if condiiton contiues to deteriorate. He is followed by Palliative care. ST will follow.     SLP Visit Diagnosis Dysphagia, pharyngeal phase (R13.13) Attention and concentration deficit following -- Frontal lobe and executive function deficit following -- Impact on safety and function Moderate aspiration risk;Severe aspiration risk   CHL IP TREATMENT RECOMMENDATION 07/11/2020 Treatment Recommendations Therapy as outlined in treatment plan below   Prognosis 07/11/2020 Prognosis for Safe Diet Advancement Fair Barriers to Reach Goals Cognitive deficits Barriers/Prognosis Comment -- CHL IP DIET RECOMMENDATION 07/11/2020 SLP Diet Recommendations Dysphagia 2 (Fine chop) solids;Nectar thick liquid Liquid Administration via Cup;Straw Medication Administration Whole meds with puree Compensations Slow rate;Small sips/bites;Clear throat intermittently;Multiple dry swallows after each bite/sip Postural Changes Seated upright at 90 degrees   CHL IP OTHER RECOMMENDATIONS 07/11/2020 Recommended Consults -- Oral Care Recommendations Oral care BID Other Recommendations Order thickener from pharmacy   CHL IP FOLLOW UP RECOMMENDATIONS 07/11/2020 Follow up Recommendations Skilled Nursing facility   [facility] IP FREQUENCY AND DURATION 07/11/2020 Speech Therapy Frequency (ACUTE ONLY) min 2x/week Treatment Duration 2 weeks      CHL IP ORAL PHASE 07/11/2020 Oral Phase Impaired Oral - Pudding Teaspoon -- Oral - Pudding Cup -- Oral - Honey Teaspoon -- Oral - Honey Cup -- Oral - Nectar Teaspoon -- Oral - Nectar Cup Right pocketing in lateral sulci;Left pocketing in lateral sulci Oral - Nectar Straw -- Oral - Thin Teaspoon -- Oral - Thin Cup WFL Oral - Thin Straw WFL Oral - Puree -- Oral - Mech Soft Lingual/palatal residue Oral - Regular -- Oral - Multi-Consistency -- Oral - Pill -- Oral Phase - Comment --  CHL  IP PHARYNGEAL PHASE 07/11/2020 Pharyngeal Phase Impaired Pharyngeal- Pudding Teaspoon -- Pharyngeal -- Pharyngeal- Pudding Cup -- Pharyngeal -- Pharyngeal- Honey Teaspoon -- Pharyngeal -- Pharyngeal- Honey Cup -- Pharyngeal -- Pharyngeal- Nectar  Teaspoon -- Pharyngeal -- Pharyngeal- Nectar Cup Pharyngeal residue - valleculae Pharyngeal -- Pharyngeal- Nectar Straw -- Pharyngeal -- Pharyngeal- Thin Teaspoon -- Pharyngeal -- Pharyngeal- Thin Cup Penetration/Aspiration during swallow;Pharyngeal residue - valleculae Pharyngeal Material enters airway, remains ABOVE vocal cords and not ejected out Pharyngeal- Thin Straw Penetration/Aspiration before swallow;Penetration/Aspiration during swallow Pharyngeal Material enters airway, CONTACTS cords and not ejected out;Material enters airway, passes BELOW cords then ejected out Pharyngeal- Puree -- Pharyngeal -- Pharyngeal- Mechanical Soft Pharyngeal residue - valleculae Pharyngeal -- Pharyngeal- Regular -- Pharyngeal -- Pharyngeal- Multi-consistency -- Pharyngeal -- Pharyngeal- Pill -- Pharyngeal -- Pharyngeal Comment --  CHL IP CERVICAL ESOPHAGEAL PHASE 07/11/2020 Cervical Esophageal Phase WFL Pudding Teaspoon -- Pudding Cup -- Honey Teaspoon -- Honey Cup -- Nectar Teaspoon -- Nectar Cup -- Nectar Straw -- Thin Teaspoon -- Thin Cup -- Thin Straw -- Puree -- Mechanical Soft -- Regular -- Multi-consistency -- Pill -- Cervical Esophageal Comment -- [name] 07/11/2020, 10:37 AM [name] Colvin Caroli.Ed Actor Pager [phone] Office [phone]                   Scheduled Meds: . Chlorhexidine Gluconate Cloth  6 each Topical Daily  . heparin injection (subcutaneous)  5,000 Units Subcutaneous Q8H  . levothyroxine  50 mcg Oral QAC breakfast  . liver oil-zinc oxide   Topical TID  . magic mouthwash  5 mL Oral BID  . mouth rinse  15 mL Mouth Rinse BID  . mirtazapine  15 mg Oral QHS  . multivitamin with minerals  1 tablet Oral Daily  .  sertraline  75 mg Oral Daily   Continuous Infusions: . ceFEPime (MAXIPIME) IV 2 g (07/12/20 1037)  . metronidazole 500 mg (07/12/20 6754)  . vancomycin 750 mg (07/11/20 1633)     LOS: 4 days    Time spent: 24min  Domenic Polite, MD [facility] 07/12/2020, 2:25 PM

## 2020-07-12 NOTE — Progress Notes (Signed)
Urology Inpatient Progress Report    Intv/Subj: No acute events overnight.  Patient just had second dressing change and is sitting up in bed comfortably.  His son is at the bedside.  Principal Problem:   Cellulitis of scrotum Active Problems:   Hypothyroidism   Hyperlipidemia   Essential hypertension   Dementia ([place])   Stercoral colitis   Obstructive uropathy   Bilateral hydronephrosis   Sepsis due to undetermined organism ([place])   AKI (acute kidney injury) ([place])   Acute UTI (urinary tract infection)   Hyperkalemia   Pressure injury of skin   Goals of care, counseling/discussion   Palliative care by specialist  Current Facility-Administered Medications  Medication Dose Route Frequency Provider Last Rate Last Admin  . acetaminophen (TYLENOL) tablet 650 mg  650 mg Oral Q6H PRN Norm Parcel, PA-C   650 mg at 07/12/20 1012   Or  . acetaminophen (TYLENOL) suppository 650 mg  650 mg Rectal Q6H PRN Norm Parcel, PA-C      . ceFEPIme (MAXIPIME) 2 g in sodium chloride 0.9 % 100 mL IVPB  2 g Intravenous Q24H Barkley Boards R, PA-C 200 mL/hr at 07/12/20 1037 2 g at 07/12/20 1037  . Chlorhexidine Gluconate Cloth 2 % PADS 6 each  6 each Topical Daily Kipp Brood, MD   6 each at 07/12/20 0600  . dextrose 5 % with KCl 20 mEq / L  infusion  20 mEq Intravenous Continuous Domenic Polite, MD 50 mL/hr at 07/12/20 1712 20 mEq at 07/12/20 1712  . heparin injection 5,000 Units  5,000 Units Subcutaneous Q8H Kipp Brood, MD   5,000 Units at 07/12/20 1459  . levothyroxine (SYNTHROID) tablet 50 mcg  50 mcg Oral QAC breakfast Norm Parcel, PA-C   50 mcg at 07/12/20 2202  . liver oil-zinc oxide (DESITIN) 40 % ointment   Topical TID Norm Parcel, [place]   Given at 07/12/20 1715  . magic mouthwash  5 mL Oral BID Norm Parcel, PA-C   5 mL at 07/12/20 1023  . MEDLINE mouth rinse  15 mL Mouth Rinse BID Agarwala, Ravi, MD   15 mL at 07/12/20 1413  . mirtazapine (REMERON) tablet 15 mg  15  mg Oral QHS Norm Parcel, PA-C   15 mg at 07/11/20 2216  . multivitamin with minerals tablet 1 tablet  1 tablet Oral Daily Domenic Polite, MD   1 tablet at 07/12/20 1013  . ondansetron (ZOFRAN) tablet 4 mg  4 mg Oral Q6H PRN Norm Parcel, PA-C       Or  . ondansetron [facility]) injection 4 mg  4 mg Intravenous Q6H PRN Norm Parcel, PA-C      . polyethylene glycol (MIRALAX / GLYCOLAX) packet 17 g  17 g Oral Daily PRN Candee Furbish, MD      . Resource ThickenUp Clear   Oral PRN Domenic Polite, MD      . senna-docusate (Senokot-S) tablet 1 tablet  1 tablet Oral QHS PRN Norm Parcel, PA-C   1 tablet at 07/11/20 2216  . sertraline (ZOLOFT) tablet 75 mg  75 mg Oral Daily Norm Parcel, PA-C   75 mg at 07/12/20 1013  . traMADol (ULTRAM) tablet 50 mg  50 mg Oral TID PRN Norm Parcel, PA-C   50 mg at 07/11/20 2216  . vancomycin (VANCOREADY) IVPB 750 mg/150 mL  750 mg Intravenous Q24H Thea Gist, RPH 150 mL/hr at 07/12/20 1652 750 mg at  07/12/20 1652     Objective: Vital: Vitals:   07/11/20 1015 07/11/20 2058 07/12/20 0526 07/12/20 1008  BP: 112/61 110/75 131/70 (!) 146/83  Pulse: 83 81 81 78  Resp: 16 20 (!) 22 18  Temp: 97.7 F (36.5 C) 97.7 F (36.5 C) 98.6 F (37 C) (!) 97.5 F (36.4 C)  TempSrc: Oral Oral Oral Oral  SpO2: 97% 100% 96% 96%  Weight:  58.7 kg    Height:       I/Os: I/O last 3 completed shifts: In: 3072.3 [P.O.:300; I.V.:2172.3; IV Piggyback:600] Out: 2325 [Urine:2325]  Physical Exam:  General: Patient is in no apparent distress Lungs: Normal respiratory effort, chest expands symmetrically. GU: Scrotal/perineal dressing was just changed prior to my arrival, pictures reviewed in chart Foley: Draining clear yellow urine Ext: lower extremities symmetric  Lab Results: Recent Labs    07/10/20 0039 07/11/20 0505 07/12/20 1219  WBC 6.1 8.6 8.1  HGB 7.3* 7.6* 7.9*  HCT 23.0* 24.1* 24.5*   Recent Labs    07/10/20 0227 07/11/20 0505  07/12/20 1219  NA 144 147* 150*  K 3.7 3.6 3.2*  CL 119* 122* 123*  CO2 17* 16* 18*  GLUCOSE 79 80 105*  BUN 66* 46* 32*  CREATININE 1.66* 1.52* 1.22  CALCIUM 8.4* 8.8* 8.6*   No results for input(s): LABPT, INR in the last 72 hours. No results for input(s): LABURIN in the last 72 hours. Results for orders placed or performed during the hospital encounter of 07/08/20  Culture, blood (Routine x 2)     Status: None (Preliminary result)   Collection Time: 07/08/20  3:33 PM   Specimen: BLOOD  Result Value Ref Range Status   Specimen Description BLOOD RIGHT ANTECUBITAL  Final   Special Requests   Final    BOTTLES DRAWN AEROBIC AND ANAEROBIC Blood Culture adequate volume   Culture   Final    NO GROWTH 4 DAYS Performed at [facility], [place] [address], [place], [place] [address]    Report Status PENDING  Incomplete  Culture, blood (Routine x 2)     Status: None (Preliminary result)   Collection Time: 07/08/20  7:39 PM   Specimen: BLOOD  Result Value Ref Range Status   Specimen Description BLOOD LEFT ANTECUBITAL  Final   Special Requests   Final    BOTTLES DRAWN AEROBIC AND ANAEROBIC Blood Culture adequate volume   Culture   Final    NO GROWTH 4 DAYS Performed at [facility], [place] [address], [place], [place] [address]    Report Status PENDING  Incomplete  Respiratory Panel by RT PCR (Flu A&B, Covid) - Nasopharyngeal Swab     Status: None   Collection Time: 07/08/20 11:30 PM   Specimen: Nasopharyngeal Swab  Result Value Ref Range Status   SARS Coronavirus 2 by RT PCR NEGATIVE NEGATIVE Final    Comment: (NOTE) SARS-CoV-2 target nucleic acids are NOT DETECTED.  The SARS-CoV-2 RNA is generally detectable in upper respiratoy specimens during the acute phase of infection. The lowest concentration of SARS-CoV-2 viral copies this assay can detect is 131 copies/mL. A negative result does not preclude SARS-Cov-2 infection and should not be used as the sole basis for  treatment or other patient management decisions. A negative result may occur with  improper specimen collection/handling, submission of specimen other than nasopharyngeal swab, presence of viral mutation(s) within the areas targeted by this assay, and inadequate number of viral copies (<131 copies/mL). A negative  result must be combined with clinical observations, patient history, and epidemiological information. The expected result is Negative.  Fact Sheet for Patients:  PinkCheek.be  Fact Sheet for Healthcare Providers:  GravelBags.it  This test is no t yet approved or cleared by the [place] FDA and  has been authorized for detection and/or diagnosis of SARS-CoV-2 by FDA under an Emergency Use Authorization (EUA). This EUA will remain  in effect (meaning this test can be used) for the duration of the COVID-19 declaration under Section 564(b)(1) of the Act, 21 U.S.C. section 360bbb-3(b)(1), unless the authorization is terminated or revoked sooner.     Influenza A by PCR NEGATIVE NEGATIVE Final   Influenza B by PCR NEGATIVE NEGATIVE Final    Comment: (NOTE) The Xpert Xpress SARS-CoV-2/FLU/RSV assay is intended as an aid in  the diagnosis of influenza from Nasopharyngeal swab specimens and  should not be used as a sole basis for treatment. Nasal washings and  aspirates are unacceptable for Xpert Xpress SARS-CoV-2/FLU/RSV  testing.  Fact Sheet for Patients: PinkCheek.be  Fact Sheet for Healthcare Providers: GravelBags.it  This test is not yet approved or cleared by the [place] FDA and  has been authorized for detection and/or diagnosis of SARS-CoV-2 by  FDA under an Emergency Use Authorization (EUA). This EUA will remain  in effect (meaning this test can be used) for the duration of the  Covid-19 declaration under Section 564(b)(1) of the Act, 21   U.S.C. section 360bbb-3(b)(1), unless the authorization is  terminated or revoked. Performed at [facility], [place] [address], [place], [place] [address]   Aerobic/Anaerobic Culture (surgical/deep wound)     Status: None (Preliminary result)   Collection Time: 07/09/20  9:21 AM   Specimen: Abscess  Result Value Ref Range Status   Specimen Description ABSCESS  Final   Special Requests NONE  Final   Gram Stain   Final    RARE WBC PRESENT,BOTH PMN AND MONONUCLEAR RARE GRAM POSITIVE COCCI IN PAIRS IN CLUSTERS Performed at [facility], [place] [address] [place], [place] [address]    Culture   Final    RARE GROUP B STREP(S.AGALACTIAE)ISOLATED TESTING AGAINST S. AGALACTIAE NOT ROUTINELY PERFORMED DUE TO PREDICTABILITY OF AMP/PEN/VAN SUSCEPTIBILITY. RARE PROTEUS MIRABILIS NO ANAEROBES ISOLATED; CULTURE IN PROGRESS FOR 5 DAYS    Report Status PENDING  Incomplete   Organism ID, Bacteria PROTEUS MIRABILIS  Final      Susceptibility   Proteus mirabilis - MIC*    AMPICILLIN <=2 SENSITIVE Sensitive     CEFAZOLIN <=4 SENSITIVE Sensitive     CEFEPIME <=0.12 SENSITIVE Sensitive     CEFTAZIDIME <=1 SENSITIVE Sensitive     CEFTRIAXONE <=0.25 SENSITIVE Sensitive     CIPROFLOXACIN <=0.25 SENSITIVE Sensitive     GENTAMICIN <=1 SENSITIVE Sensitive     IMIPENEM 1 SENSITIVE Sensitive     TRIMETH/SULFA <=20 SENSITIVE Sensitive     AMPICILLIN/SULBACTAM <=2 SENSITIVE Sensitive     PIP/TAZO <=4 SENSITIVE Sensitive     * RARE PROTEUS MIRABILIS  MRSA PCR Screening     Status: None   Collection Time: 07/10/20  3:08 PM   Specimen: Nasal Mucosa; Nasopharyngeal  Result Value Ref Range Status   MRSA by PCR NEGATIVE NEGATIVE Final    Comment:        The GeneXpert MRSA Assay (FDA approved for NASAL specimens only), is one component of a comprehensive MRSA colonization surveillance program. It is not intended to diagnose MRSA infection nor to  guide or monitor treatment for MRSA  infections. Performed at [facility], [place] [address], [place], [place] [address]     Studies/Results: DG Swallowing Func-Speech Pathology  Result Date: 07/11/2020 Objective Swallowing Evaluation: Type of Study: MBS-Modified Barium Swallow Study  Patient Details Name: Wayne [name] MRN: [id number] Date of Birth: 1926-02-25 Today's Date: 07/11/2020 Time: SLP Start Time (ACUTE ONLY): 0901 -SLP Stop Time (ACUTE ONLY): 0917 SLP Time Calculation (min) (ACUTE ONLY): 16 min Past Medical History: Past Medical History: Diagnosis Date . Anxiety   takes Atrivan daily . Back pain   chronic with neck pain . Cancer ([place])   skin . CHF (congestive heart failure) ([place])  . Constipation   related to medication . Dementia ([place])   takes Aricept nightly . Enlarged prostate   self caths once every 1-2wks . GERD (gastroesophageal reflux disease)   doesn't require medication . Hx of seasonal allergies  . Hyperlipidemia   takes Simvastatin daily . Hypertension   takes atenolol daily . Hypothyroidism   takes Synthroid daily . Insomnia   related to pain . MRSA (methicillin resistant staph aureus) culture positive   Per patient tested on 10/07/11. . Osteoporosis  . Peripheral edema   takes Torsemide daily . Scoliosis  Past Surgical History: Past Surgical History: Procedure Laterality Date . abdominal cyst    removed-2012;MRSA done by Dr.Gross . CATARACT EXTRACTION   . cataracts    bilateral . CERVICAL DISC SURGERY    x1 . COLONOSCOPY   . ESOPHAGOGASTRODUODENOSCOPY   . HERNIA REPAIR    Right inguinal . INCISION AND DRAINAGE OF WOUND N/A 07/09/2020  Procedure: IRRIGATION AND DEBRIDEMENT SCROTUM AND PERINEUM;  Surgeon: Rolm Bookbinder, MD;  Location: [place];  Service: General;  Laterality: N/A; . INCISION AND DRAINAGE OF WOUND N/A 07/09/2020  Procedure: DEBRIDEMENT OF SCROTUM;  Surgeon: Robley Fries, MD;  Location: [place] [facility];  Service: Urology;  Laterality: N/A; . INTRAMEDULLARY (IM) NAIL INTERTROCHANTERIC Left 10/12/2018 .  INTRAMEDULLARY (IM) NAIL INTERTROCHANTERIC Left 10/12/2018  Procedure: INTRAMEDULLARY (IM) NAIL INTERTROCHANTRIC;  Surgeon: Renette Butters, MD;  Location: [facility];  Service: Orthopedics;  Laterality: Left; . LUMBAR DISC SURGERY    x2 . TOE SURGERY    right great toe . TRANSURETHRAL RESECTION OF PROSTATE    15+yrs ago HPI: Wayne [name] is a 86 y.o. male with medical history significant of cervical fusion, anxiety, dementia, chronic back and neck pain, unspecified skin cancer, chronic diastolic CHF, constipation, BPH, GERD, hyperlipidemia, hypertension, hypothyroidism, peripheral edema, scoliosis, admitted with scrotal edema and erythema. Underwent debridement due to severe perineal infection (necrotizing sof tissue infection) with postoperative shock. Suspected to have chronic aspiration. Palliative care meeting with son 9/28. CXR No acute disease  No data recorded Assessment / Plan / Recommendation CHL IP CLINICAL IMPRESSIONS 07/11/2020 Clinical Impression Pt has acute on chronic oropharyngeal dysphagia which he has managed well until past several weeks leading up to current illness. Overall, general deconditioning and evidence of cervical fusion contribute to worsening dysphagia. Impairments observed in timing of laryngeal closure with delayed epiglottic deflection resulting in penetration before the swallow, during falling slightly below vocal cords with thin via straw (above cords with cup). Residue retained in vallecule mild, increased with solid. Esophageal scan unremarkable. Recommend nectar thick liquids, water protocol, Dys 2 texture, straws allowed, 2 swallows and full supervision and assist with meals. Pt may wish to return to regular diet/thin liquids if condiiton contiues to deteriorate. He is followed by Palliative care. ST will  follow.     SLP Visit Diagnosis Dysphagia, pharyngeal phase (R13.13) Attention and concentration deficit following -- Frontal lobe and executive function deficit following --  Impact on safety and function Moderate aspiration risk;Severe aspiration risk   CHL IP TREATMENT RECOMMENDATION 07/11/2020 Treatment Recommendations Therapy as outlined in treatment plan below   Prognosis 07/11/2020 Prognosis for Safe Diet Advancement Fair Barriers to Reach Goals Cognitive deficits Barriers/Prognosis Comment -- CHL IP DIET RECOMMENDATION 07/11/2020 SLP Diet Recommendations Dysphagia 2 (Fine chop) solids;Nectar thick liquid Liquid Administration via Cup;Straw Medication Administration Whole meds with puree Compensations Slow rate;Small sips/bites;Clear throat intermittently;Multiple dry swallows after each bite/sip Postural Changes Seated upright at 90 degrees   CHL IP OTHER RECOMMENDATIONS 07/11/2020 Recommended Consults -- Oral Care Recommendations Oral care BID Other Recommendations Order thickener from pharmacy   CHL IP FOLLOW UP RECOMMENDATIONS 07/11/2020 Follow up Recommendations Skilled Nursing facility   [facility] IP FREQUENCY AND DURATION 07/11/2020 Speech Therapy Frequency (ACUTE ONLY) min 2x/week Treatment Duration 2 weeks      CHL IP ORAL PHASE 07/11/2020 Oral Phase Impaired Oral - Pudding Teaspoon -- Oral - Pudding Cup -- Oral - Honey Teaspoon -- Oral - Honey Cup -- Oral - Nectar Teaspoon -- Oral - Nectar Cup Right pocketing in lateral sulci;Left pocketing in lateral sulci Oral - Nectar Straw -- Oral - Thin Teaspoon -- Oral - Thin Cup WFL Oral - Thin Straw WFL Oral - Puree -- Oral - Mech Soft Lingual/palatal residue Oral - Regular -- Oral - Multi-Consistency -- Oral - Pill -- Oral Phase - Comment --  CHL IP PHARYNGEAL PHASE 07/11/2020 Pharyngeal Phase Impaired Pharyngeal- Pudding Teaspoon -- Pharyngeal -- Pharyngeal- Pudding Cup -- Pharyngeal -- Pharyngeal- Honey Teaspoon -- Pharyngeal -- Pharyngeal- Honey Cup -- Pharyngeal -- Pharyngeal- Nectar Teaspoon -- Pharyngeal -- Pharyngeal- Nectar Cup Pharyngeal residue - valleculae Pharyngeal -- Pharyngeal- Nectar Straw -- Pharyngeal -- Pharyngeal- Thin  Teaspoon -- Pharyngeal -- Pharyngeal- Thin Cup Penetration/Aspiration during swallow;Pharyngeal residue - valleculae Pharyngeal Material enters airway, remains ABOVE vocal cords and not ejected out Pharyngeal- Thin Straw Penetration/Aspiration before swallow;Penetration/Aspiration during swallow Pharyngeal Material enters airway, CONTACTS cords and not ejected out;Material enters airway, passes BELOW cords then ejected out Pharyngeal- Puree -- Pharyngeal -- Pharyngeal- Mechanical Soft Pharyngeal residue - valleculae Pharyngeal -- Pharyngeal- Regular -- Pharyngeal -- Pharyngeal- Multi-consistency -- Pharyngeal -- Pharyngeal- Pill -- Pharyngeal -- Pharyngeal Comment --  CHL IP CERVICAL ESOPHAGEAL PHASE 07/11/2020 Cervical Esophageal Phase WFL Pudding Teaspoon -- Pudding Cup -- Honey Teaspoon -- Honey Cup -- Nectar Teaspoon -- Nectar Cup -- Nectar Straw -- Thin Teaspoon -- Thin Cup -- Thin Straw -- Puree -- Mechanical Soft -- Regular -- Multi-consistency -- Pill -- Cervical Esophageal Comment -- [name] 07/11/2020, 10:37 AM [name] Colvin Caroli.Ed Actor Pager [phone] Office [phone]               Assessment: 86 yo man presented to ED with scrotal swelling with scrotal/perineal NSTI now POD3 s/p debridement clinically improving.  Will increase from daily to twice daily dressing changes given residual drainage/exudate in left perineum.  Patient is much more interactive this evening and sitting up eating in bed.  Plan: -Continue twice daily dressing changes -continue antibiotics -continue foley for urinary retention and while wound heals -Plastic surgery plans to place matrix next week -According to son, they are considering SNF versus hospice care at time of discharge  Jacalyn Lefevre, MD Urology 07/12/2020, 6:17 PM

## 2020-07-13 DIAGNOSIS — N1330 Unspecified hydronephrosis: Secondary | ICD-10-CM | POA: Diagnosis not present

## 2020-07-13 DIAGNOSIS — N492 Inflammatory disorders of scrotum: Secondary | ICD-10-CM | POA: Diagnosis not present

## 2020-07-13 DIAGNOSIS — N390 Urinary tract infection, site not specified: Secondary | ICD-10-CM | POA: Diagnosis not present

## 2020-07-13 DIAGNOSIS — Z66 Do not resuscitate: Secondary | ICD-10-CM

## 2020-07-13 DIAGNOSIS — N179 Acute kidney failure, unspecified: Secondary | ICD-10-CM | POA: Diagnosis not present

## 2020-07-13 DIAGNOSIS — Z789 Other specified health status: Secondary | ICD-10-CM

## 2020-07-13 LAB — CULTURE, BLOOD (ROUTINE X 2)
Culture: NO GROWTH
Culture: NO GROWTH
Special Requests: ADEQUATE
Special Requests: ADEQUATE

## 2020-07-13 LAB — BASIC METABOLIC PANEL
Anion gap: 9 (ref 5–15)
BUN: 24 mg/dL — ABNORMAL HIGH (ref 8–23)
CO2: 17 mmol/L — ABNORMAL LOW (ref 22–32)
Calcium: 8.5 mg/dL — ABNORMAL LOW (ref 8.9–10.3)
Chloride: 121 mmol/L — ABNORMAL HIGH (ref 98–111)
Creatinine, Ser: 1.19 mg/dL (ref 0.61–1.24)
GFR calc Af Amer: >60 mL/min (ref >60)
GFR calc non Af Amer: 53 mL/min — ABNORMAL LOW (ref >60)
Glucose, Bld: 99 mg/dL (ref 70–99)
Potassium: 2.9 mmol/L — ABNORMAL LOW (ref 3.5–5.1)
Sodium: 147 mmol/L — ABNORMAL HIGH (ref 135–145)

## 2020-07-13 LAB — CBC WITH DIFFERENTIAL/PLATELET
Abs Immature Granulocytes: 0.16 10*3/uL — ABNORMAL HIGH (ref 0.00–0.07)
Basophils Absolute: 0 10*3/uL (ref 0.0–0.1)
Basophils Relative: 0 %
Eosinophils Absolute: 0.1 10*3/uL (ref 0.0–0.5)
Eosinophils Relative: 2 %
HCT: 26.3 % — ABNORMAL LOW (ref 39.0–52.0)
Hemoglobin: 8.5 g/dL — ABNORMAL LOW (ref 13.0–17.0)
Immature Granulocytes: 2 %
Lymphocytes Relative: 22 %
Lymphs Abs: 1.8 10*3/uL (ref 0.7–4.0)
MCH: 29.1 pg (ref 26.0–34.0)
MCHC: 32.3 g/dL (ref 30.0–36.0)
MCV: 90.1 fL (ref 80.0–100.0)
Monocytes Absolute: 0.7 10*3/uL (ref 0.1–1.0)
Monocytes Relative: 8 %
Neutro Abs: 5.4 10*3/uL (ref 1.7–7.7)
Neutrophils Relative %: 66 %
Platelets: 245 10*3/uL (ref 150–400)
RBC: 2.92 MIL/uL — ABNORMAL LOW (ref 4.22–5.81)
RDW: 15.7 % — ABNORMAL HIGH (ref 11.5–15.5)
WBC: 8.2 10*3/uL (ref 4.0–10.5)
nRBC: 0 % (ref 0.0–0.2)

## 2020-07-13 LAB — GLUCOSE, CAPILLARY
Glucose-Capillary: 102 mg/dL — ABNORMAL HIGH (ref 70–99)
Glucose-Capillary: 108 mg/dL — ABNORMAL HIGH (ref 70–99)
Glucose-Capillary: 114 mg/dL — ABNORMAL HIGH (ref 70–99)
Glucose-Capillary: 88 mg/dL (ref 70–99)
Glucose-Capillary: 91 mg/dL (ref 70–99)
Glucose-Capillary: 93 mg/dL (ref 70–99)

## 2020-07-13 MED ORDER — POTASSIUM CHLORIDE 20 MEQ/15ML (10%) PO SOLN
40.0000 meq | Freq: Two times a day (BID) | ORAL | Status: AC
  Administered 2020-07-13 (×2): 40 meq via ORAL
  Filled 2020-07-13 (×2): qty 30

## 2020-07-13 NOTE — Progress Notes (Signed)
Pharmacy Antibiotic Note  Wayne [name] is a 86 y.o. male admitted on 07/08/2020 with cellulitis.  Pharmacy has been consulted for cefepime and vancomycin dosing.   Patient remains afebrile with WBC down to 6.1 and lactic acid down to 1.6. Since admission, patient's renal function has improved with Scr down to 1.66 with CrCl ~21. Blood cultures remain negative to date. There appears to be no further surgery or debridement recommended at this time. Abscess cultures are showing rare group B strep and proteus.  MD notes plan to continue broad spectrum antibiotics, patient remains on vancomycin cefepime. SCr impoved 1.6 > 1.1.  Plan:  Cefepime 2g IV every 24 hours Vancomycin 750 mg IV q24h Obtain vancomycin level on Sunday Monitor renal function and UOP closely Follow up with cultures, antibiotic de-escalation and LOT Would consider discontinuing vancomycin and changing antibiotics (many options) to remain broad spectrum; but would be beneficial to patient to remove vancomycin   Height: 5' 7.5" (171.5 cm) Weight: 58.7 kg (129 lb 6.6 oz) IBW/kg (Calculated) : 67.25  Temp (24hrs), Avg:97.6 F (36.4 C), Min:97 F (36.1 C), Max:97.9 F (36.6 C)  Recent Labs  Lab 07/08/20 1519 07/08/20 1528 07/08/20 1528 07/08/20 1935 07/09/20 0412 07/10/20 0039 07/10/20 0227 07/10/20 1205 07/11/20 0505 07/12/20 1219 07/13/20 0452  WBC  --  13.9*   < >  --  12.0* 6.1  --   --  8.6 8.1 8.2  CREATININE  --  3.23*  --   --   --   --  1.66*  --  1.52* 1.22 1.19  LATICACIDVEN 2.0*  --   --  1.6  --   --   --   --   --   --   --   VANCORANDOM  --   --   --   --   --   --   --  6  --   --   --    < > = values in this interval not displayed.     Antimicrobials this admission: Cefepime 9/27 >> Vancomycin 9/27>> Flagyl 9/27>>  Microbiology results: 9/28 Abscess - rare GPC in pairs and clusters 9/27 BCx x2 - ngtd  9/27 COVID/Flu - negative   Harvel Quale 07/13/2020 10:08 AM

## 2020-07-13 NOTE — Progress Notes (Signed)
Daily Progress Note   Patient Name: Wayne [name]       Date: 07/13/2020 DOB: [birth year]  Age: 86 y.o. MRN#: [id number] Attending Physician: Domenic Polite, MD Primary Care Physician: Isaac Bliss, Rayford Halsted, MD Admit Date: 07/08/2020  Reason for Consultation/Follow-up: Establishing goals of care  Subjective: Chart review performed. Received report from primary RN - no acute concerns. RN states that patient has had improved PO intake and is more awake and alert today.  Went to visit patient at bedside - daughter/Beth present. Patient was lying in bed awake and alert. He denies pain. No signs or non-verbal gestures of pain or discomfort noted. No respiratory distress, increased work of breathing, or secretions noted. He was wearing mitts - per [facility] it was because he was pulling at telemetry leads.   Received updates from [facility] - she states the patient ate 100% of his breakfast and has been able to tolerate drinking 4 cups of water so far today with her assistance getting it to his mouth. They are planning to proceed with the wound matrix mesh procedure on Monday. Eustaquio Maize explains that she and [name] have had conversations about [place]. At this time, if the patient continues to have improved oral intake and tolerates the procedure well Monday, they are interested in the patient returning to [facility] with outpatient Palliative Care. If the patient does not tolerate the procedure well Monday and/or is not able to continue eating enough to sustain adequate nutritional needs, they are open to hospice at discharge.   All questions and concerns addressed. Encouraged to call with questions and/or concerns. PMT card provided.  After visit with patient and Beth, attempted to call David/son/HCPOA - no answer - left  voicemail.  Length of Stay: 5  Current Medications: Scheduled Meds:  . Chlorhexidine Gluconate Cloth  6 each Topical Daily  . heparin injection (subcutaneous)  5,000 Units Subcutaneous Q8H  . levothyroxine  50 mcg Oral QAC breakfast  . liver oil-zinc oxide   Topical TID  . magic mouthwash  5 mL Oral BID  . mouth rinse  15 mL Mouth Rinse BID  . mirtazapine  15 mg Oral QHS  . multivitamin with minerals  1 tablet Oral Daily  . potassium chloride  40 mEq Oral BID  . sertraline  75 mg Oral Daily  Continuous Infusions: . ceFEPime (MAXIPIME) IV 2 g (07/13/20 1108)  . vancomycin 750 mg (07/12/20 1652)    PRN Meds: acetaminophen **OR** acetaminophen, ondansetron **OR** ondansetron (ZOFRAN) IV, polyethylene glycol, Resource ThickenUp Clear, senna-docusate, traMADol  Physical Exam Vitals and nursing note reviewed.  Constitutional:      General: He is not in acute distress.    Comments: Frail appearing  Pulmonary:     Effort: No respiratory distress.  Skin:    General: Skin is warm and dry.  Neurological:     Mental Status: He is alert.     Motor: Weakness present.  Psychiatric:        Attention and Perception: Attention normal.        Behavior: Behavior is cooperative.             Vital Signs: BP 130/72 (BP Location: Right Arm)   Pulse 73   Temp 97.8 F (36.6 C) (Oral)   Resp 18   Ht 5' 7.5" (1.715 m)   Wt 58.7 kg   SpO2 100%   BMI 19.97 kg/m  SpO2: SpO2: 100 % O2 Device: O2 Device: Room Air O2 Flow Rate:    Intake/output summary:   Intake/Output Summary (Last 24 hours) at 07/13/2020 1216 Last data filed at 07/13/2020 1000 Gross per 24 hour  Intake 1198.76 ml  Output 1100 ml  Net 98.76 ml   LBM: Last BM Date:  (PTA) Baseline Weight: Weight: 54.4 kg Most recent weight: Weight: 58.7 kg       Palliative Assessment/Data: PPS 30%      Patient Active Problem List   Diagnosis Date Noted  . Goals of care, counseling/discussion   . Palliative care by  specialist   . Pressure injury of skin 07/09/2020  . Cellulitis of scrotum 07/08/2020  . Dementia ([place])   . Stercoral colitis   . Obstructive uropathy   . Bilateral hydronephrosis   . Sepsis due to undetermined organism ([place])   . AKI (acute kidney injury) ([place])   . Acute UTI (urinary tract infection)   . Hyperkalemia   . Hip fracture, unspecified laterality, closed, initial encounter ([place]) 10/11/2018  . Hip fracture ([place]) 10/11/2018  . Closed intertrochanteric fracture of left femur ([place]) 10/11/2018  . Viral URI with cough 10/20/2017  . Pneumonia of left lower lobe due to infectious organism 10/20/2017  . Renal insufficiency 01/25/2012  . Insomnia 12/21/2011  . Venous insufficiency 08/24/2011  . ALLERGIC RHINITIS 12/10/2008  . DERMATITIS, CONTACT, NOS 08/11/2007  . Short-term memory loss 08/02/2007  . Hypothyroidism 04/07/2007  . Hyperlipidemia 04/07/2007  . Essential hypertension 04/07/2007    Palliative Care Assessment & Plan   Patient Profile: 86 y.o.malewith past medical history of hypertension, hyperlipidemia, dementia, chronic back pain, BPH, and [facility]with complaint of scrotal erythema and edema. In the ED, CT abdomen/pelvis with extensive phlegmon from the left gluteal cleft all the way to the base of the penile corpora. Moderate bilateral hydro-retronephrosis without obstructing calculus. Possible superimposed cystitis. Scrotal ultrasound shows extensive soft tissue edema.  Patient went to the OR on 9/28 for excisional debridement for NSTI of scrotum and perineum 9/28.   Assessment: Septic shock Necrotizing fasciitis of scrotum and perineum Cellulitis of scrotum Severe protein calorie malnutrition Severe debility Dysphagia AKI Bilateral hydronephrosis Chronic anemia Dementia Acute UTI Pressure injury of skin   Recommendations/Plan:  Continue current medical treatment with watchful  waiting  Continue DNR/DNI as previously documented  Plan is to proceed  with the wound matrix mesh procedure on Monday  If the patient continues to have improvement in oral intake and tolerates the procedure well, family would like the patient discharged back to [facility] with outpatient Palliative Care. If the patient's intake is not enough to sustain adequate nutritional needs and/or he does not tolerate the procedure well, the family is open to hospice at discharge  PMT will continue to follow holistically  Goals of Care and Additional Recommendations:  Limitations on Scope of Treatment: Full Scope Treatment  Code Status:    Code Status Orders  (From admission, onward)         Start     Ordered   07/09/20 1827  Do not attempt resuscitation (DNR)  Continuous       Question Answer Comment  In the event of cardiac or respiratory ARREST Do not call a "code blue"   In the event of cardiac or respiratory ARREST Do not perform Intubation, CPR, defibrillation or ACLS   In the event of cardiac or respiratory ARREST Use medication by any route, position, wound care, and other measures to relive pain and suffering. May use oxygen, suction and manual treatment of airway obstruction as needed for comfort.      07/09/20 1826        Code Status History    Date Active Date Inactive Code Status Order ID Comments User Context   07/08/2020 2306 07/09/2020 1826 Full Code [id number]  Reubin Milan, MD ED   10/11/2018 1448 10/15/2018 1940 Full Code [id number]  Thurnell Lose, MD Inpatient   Advance Care Planning Activity       Prognosis:   Unable to determine  Discharge Planning:  To Be Determined  Care plan was discussed with primary RN, NT, daughter/Beth  Thank you for allowing the Palliative Medicine Team to assist in the care of this patient.   Total Time 20 minutes Prolonged Time Billed  no       Greater than 50%  of this time was spent counseling and coordinating care  related to the above assessment and plan.  Lin Landsman, NP  Please contact Palliative Medicine Team phone at [phone] for questions and concerns.

## 2020-07-13 NOTE — Progress Notes (Signed)
PROGRESS NOTE    Wayne [name]  OJJ:[id number] DOB: [birth year] DOA: 07/08/2020 PCP: Isaac Bliss, Rayford Halsted, MD  Brief Narrative: Frail elderly 89/M from [facility], SNF w/ cognitive deficits, chronic diastolic CHF, severe protein calorie malnutrition, failure to thrive was admitted to the ICU on 9/27 with septic shock secondary to perineal necrotizing fasciitis. -Underwent extensive debridement in the OR by urology and general surgery, remained hypotensive in PACU, admitted to the ICU on vasopressors -Blood pressure improved, transferred to [facility] service  9/30   Assessment & Plan:   Septic shock  Necrotizing fasciitis of scrotum/perineum -Treated with broad-spectrum antibiotics, pressors, IV fluids and albumin -Remains on broad-spectrum antibiotics, day 6 today -Blood pressure has stabilized off pressors -Blood cultures are negative, Intra-Op cultures with rare group B strep, rare Proteus, Flagyl discontinued, day 6 of IV vancomycin and cefepime, still with considerable purulent exudate -Urology following -Plastic surgery also following, plan for wound matrix mesh early next week -Palliative care also following, considering hospice at the time of discharge to SNF  Severe protein calorie malnutrition Adult failure to thrive Severe debility -Overall prognosis is poor -Oral intake remains extremely poor -Palliative care following -Hospice also being considered at the time of discharge to SNF  Dysphagia -Acute on chronic -Discussed with SLP, will remain high aspiration risk, now on dysphagia 2 diet  -Oral intake remains very poor -Discussed poor prognosis with patient's son and recommended symptom/comfort focused care, hospice at discharge seems reasonable  Acute kidney injury -In the setting of sepsis and shock, improving -Fluids were changed to D5 with potassium given Hyponatremia -Cut down Fluids with Worsening Edema  Bilateral hydronephrosis Chronic bladder outlet  obstruction Now urinating without issues, creatinine improving,  -Creatinine down to 1.2 now -Continue Foley catheter  Indeterminate right lower lobe level lesion -Unclear etiology, not appropriate for additional work-up at this time  Chronic anemia -Monitor, not appropriate for extensive work-up  DVT prophylaxis: Heparin subcutaneous Code Status: DNR Family Communication: Discussed with son [name] at bedside 9/30 Disposition Plan:  Status is: Inpatient  Remains inpatient appropriate because:Inpatient level of care appropriate due to severity of illness   Dispo: The patient is from: SNF              Anticipated d/c is to: SNF versus hospice, extensive discussion with son 9/30              Anticipated d/c date is: > 3 days              Patient currently is not medically stable to d/c.  Consultants:   Urology, general surgery, PCCM  Plastic surgery   Procedures: Exploration, washout and debridement of necrotic scrotum for necrotizing fasciitis on 9/28  Antimicrobials:    Subjective: -Very poor p.o. intake, sleeping most of the time  Objective: Vitals:   07/12/20 1008 07/12/20 2034 07/13/20 0424 07/13/20 0818  BP: (!) 146/83 (!) 148/71 (!) 153/75 130/72  Pulse: 78 76 77 73  Resp: 18 18 18 18   Temp: (!) 97.5 F (36.4 C) 97.9 F (36.6 C) (!) 97 F (36.1 C) 97.8 F (36.6 C)  TempSrc: Oral Oral  Oral  SpO2: 96% 99% 100% 100%  Weight:      Height:        Intake/Output Summary (Last 24 hours) at 07/13/2020 1200 Last data filed at 07/13/2020 1000 Gross per 24 hour  Intake 1198.76 ml  Output 1100 ml  Net 98.76 ml   Filed Weights   07/08/20 1900 07/11/20 0304  07/11/20 2058  Weight: 51.7 kg 87.6 kg 58.7 kg    Examination:  General exam: Cachectic elderly male sitting up in bed, awake alert oriented to self only, pleasantly confused CVS: S1-S2, regular rate rhythm Lungs: Decreased breath sounds to bases, otherwise clear Abdomen: Soft, nontender, bowel sounds  present, GU GU with scrotal dressing noted Extremities: 2+ edema in the ankles/feet Skin: As above Psychiatry: Poor insight and judgment    Data Reviewed:   CBC: Recent Labs  Lab 07/09/20 0412 07/10/20 0039 07/11/20 0505 07/12/20 1219 07/13/20 0452  WBC 12.0* 6.1 8.6 8.1 8.2  NEUTROABS 9.6* 4.8 6.0 5.8 5.4  HGB 8.7* 7.3* 7.6* 7.9* 8.5*  HCT 27.6* 23.0* 24.1* 24.5* 26.3*  MCV 90.2 89.5 89.9 89.4 90.1  PLT 281 208 268 264 300   Basic Metabolic Panel: Recent Labs  Lab 07/08/20 1528 07/10/20 0227 07/11/20 0505 07/12/20 1219 07/13/20 0452  NA 134* 144 147* 150* 147*  K 5.2* 3.7 3.6 3.2* 2.9*  CL 106 119* 122* 123* 121*  CO2 16* 17* 16* 18* 17*  GLUCOSE 114* 79 80 105* 99  BUN 126* 66* 46* 32* 24*  CREATININE 3.23* 1.66* 1.52* 1.22 1.19  CALCIUM 8.9 8.4* 8.8* 8.6* 8.5*  MG  --  2.0  --   --   --    GFR: Estimated Creatinine Clearance: 33.6 mL/min (by C-G formula based on SCr of 1.19 mg/dL). Liver Function Tests: Recent Labs  Lab 07/08/20 1528 07/09/20 0412 07/10/20 0227 07/11/20 0505  AST 44* 23 14* 12*  ALT 45* 33 18 17  ALKPHOS 213* 155* 92 89  BILITOT 0.6 0.7 1.0 1.3*  PROT 6.6 5.8* 5.3* 5.5*  ALBUMIN 2.0* 1.7* 2.3* 2.5*   No results for input(s): LIPASE, AMYLASE in the last 168 hours. No results for input(s): AMMONIA in the last 168 hours. Coagulation Profile: Recent Labs  Lab 07/08/20 1528 07/09/20 0412  INR 1.9* 1.9*   Cardiac Enzymes: No results for input(s): CKTOTAL, CKMB, CKMBINDEX, TROPONINI in the last 168 hours. BNP (last 3 results) No results for input(s): PROBNP in the last 8760 hours. HbA1C: No results for input(s): HGBA1C in the last 72 hours. CBG: Recent Labs  Lab 07/12/20 2032 07/13/20 0027 07/13/20 0424 07/13/20 0733 07/13/20 1146  GLUCAP 103* 102* 88 91 114*   Lipid Profile: No results for input(s): CHOL, HDL, LDLCALC, TRIG, CHOLHDL, LDLDIRECT in the last 72 hours. Thyroid Function Tests: No results for input(s): TSH,  T4TOTAL, FREET4, T3FREE, THYROIDAB in the last 72 hours. Anemia Panel: No results for input(s): VITAMINB12, FOLATE, FERRITIN, TIBC, IRON, RETICCTPCT in the last 72 hours. Urine analysis:    Component Value Date/Time   COLORURINE BROWN (A) 07/08/2020 2232   APPEARANCEUR TURBID (A) 07/08/2020 2232   LABSPEC 1.011 07/08/2020 2232   PHURINE 6.0 07/08/2020 2232   GLUCOSEU NEGATIVE 07/08/2020 2232   HGBUR LARGE (A) 07/08/2020 2232   BILIRUBINUR NEGATIVE 07/08/2020 2232   BILIRUBINUR n 11/11/2015 1716   KETONESUR NEGATIVE 07/08/2020 2232   PROTEINUR 100 (A) 07/08/2020 2232   UROBILINOGEN 0.2 11/11/2015 1716   UROBILINOGEN 0.2 02/15/2012 1335   NITRITE NEGATIVE 07/08/2020 2232   LEUKOCYTESUR MODERATE (A) 07/08/2020 2232   Sepsis Labs: @LABRCNTIP (procalcitonin:4,lacticidven:4)  ) Recent Results (from the past 240 hour(s))  Culture, blood (Routine x 2)     Status: None   Collection Time: 07/08/20  3:33 PM   Specimen: BLOOD  Result Value Ref Range Status   Specimen Description BLOOD RIGHT ANTECUBITAL  Final  Special Requests   Final    BOTTLES DRAWN AEROBIC AND ANAEROBIC Blood Culture adequate volume   Culture   Final    NO GROWTH 5 DAYS Performed at [facility], [place] [address] [place], [place] [address]    Report Status 07/13/2020 FINAL  Final  Culture, blood (Routine x 2)     Status: None   Collection Time: 07/08/20  7:39 PM   Specimen: BLOOD  Result Value Ref Range Status   Specimen Description BLOOD LEFT ANTECUBITAL  Final   Special Requests   Final    BOTTLES DRAWN AEROBIC AND ANAEROBIC Blood Culture adequate volume   Culture   Final    NO GROWTH 5 DAYS Performed at [facility], [place] [address], [place], [place] [address]    Report Status 07/13/2020 FINAL  Final  Respiratory Panel by RT PCR (Flu A&B, Covid) - Nasopharyngeal Swab     Status: None   Collection Time: 07/08/20 11:30 PM   Specimen: Nasopharyngeal Swab  Result Value Ref Range Status   SARS  Coronavirus 2 by RT PCR NEGATIVE NEGATIVE Final    Comment: (NOTE) SARS-CoV-2 target nucleic acids are NOT DETECTED.  The SARS-CoV-2 RNA is generally detectable in upper respiratoy specimens during the acute phase of infection. The lowest concentration of SARS-CoV-2 viral copies this assay can detect is 131 copies/mL. A negative result does not preclude SARS-Cov-2 infection and should not be used as the sole basis for treatment or other patient management decisions. A negative result may occur with  improper specimen collection/handling, submission of specimen other than nasopharyngeal swab, presence of viral mutation(s) within the areas targeted by this assay, and inadequate number of viral copies (<131 copies/mL). A negative result must be combined with clinical observations, patient history, and epidemiological information. The expected result is Negative.  Fact Sheet for Patients:  PinkCheek.be  Fact Sheet for Healthcare Providers:  GravelBags.it  This test is no t yet approved or cleared by the [place] FDA and  has been authorized for detection and/or diagnosis of SARS-CoV-2 by FDA under an Emergency Use Authorization (EUA). This EUA will remain  in effect (meaning this test can be used) for the duration of the COVID-19 declaration under Section 564(b)(1) of the Act, 21 U.S.C. section 360bbb-3(b)(1), unless the authorization is terminated or revoked sooner.     Influenza A by PCR NEGATIVE NEGATIVE Final   Influenza B by PCR NEGATIVE NEGATIVE Final    Comment: (NOTE) The Xpert Xpress SARS-CoV-2/FLU/RSV assay is intended as an aid in  the diagnosis of influenza from Nasopharyngeal swab specimens and  should not be used as a sole basis for treatment. Nasal washings and  aspirates are unacceptable for Xpert Xpress SARS-CoV-2/FLU/RSV  testing.  Fact Sheet for  Patients: PinkCheek.be  Fact Sheet for Healthcare Providers: GravelBags.it  This test is not yet approved or cleared by the [place] FDA and  has been authorized for detection and/or diagnosis of SARS-CoV-2 by  FDA under an Emergency Use Authorization (EUA). This EUA will remain  in effect (meaning this test can be used) for the duration of the  Covid-19 declaration under Section 564(b)(1) of the Act, 21  U.S.C. section 360bbb-3(b)(1), unless the authorization is  terminated or revoked. Performed at [facility], [place] [address] [place], [place] [address]   Aerobic/Anaerobic Culture (surgical/deep wound)     Status: None (Preliminary result)   Collection Time: 07/09/20  9:21 AM   Specimen: Abscess  Result Value Ref Range Status   Specimen Description ABSCESS  Final   Special Requests NONE  Final   Gram Stain   Final    RARE WBC PRESENT,BOTH PMN AND MONONUCLEAR RARE GRAM POSITIVE COCCI IN PAIRS IN CLUSTERS Performed at [facility], [address], [place], [place] [address]    Culture   Final    RARE GROUP B STREP(S.AGALACTIAE)ISOLATED TESTING AGAINST S. AGALACTIAE NOT ROUTINELY PERFORMED DUE TO PREDICTABILITY OF AMP/PEN/VAN SUSCEPTIBILITY. RARE PROTEUS MIRABILIS NO ANAEROBES ISOLATED; CULTURE IN PROGRESS FOR 5 DAYS    Report Status PENDING  Incomplete   Organism ID, Bacteria PROTEUS MIRABILIS  Final      Susceptibility   Proteus mirabilis - MIC*    AMPICILLIN <=2 SENSITIVE Sensitive     CEFAZOLIN <=4 SENSITIVE Sensitive     CEFEPIME <=0.12 SENSITIVE Sensitive     CEFTAZIDIME <=1 SENSITIVE Sensitive     CEFTRIAXONE <=0.25 SENSITIVE Sensitive     CIPROFLOXACIN <=0.25 SENSITIVE Sensitive     GENTAMICIN <=1 SENSITIVE Sensitive     IMIPENEM 1 SENSITIVE Sensitive     TRIMETH/SULFA <=20 SENSITIVE Sensitive     AMPICILLIN/SULBACTAM <=2 SENSITIVE Sensitive     PIP/TAZO <=4 SENSITIVE Sensitive     * RARE  PROTEUS MIRABILIS  MRSA PCR Screening     Status: None   Collection Time: 07/10/20  3:08 PM   Specimen: Nasal Mucosa; Nasopharyngeal  Result Value Ref Range Status   MRSA by PCR NEGATIVE NEGATIVE Final    Comment:        The GeneXpert MRSA Assay (FDA approved for NASAL specimens only), is one component of a comprehensive MRSA colonization surveillance program. It is not intended to diagnose MRSA infection nor to guide or monitor treatment for MRSA infections. Performed at [facility], [place] [address], [place], [place] [address]         Scheduled Meds: . Chlorhexidine Gluconate Cloth  6 each Topical Daily  . heparin injection (subcutaneous)  5,000 Units Subcutaneous Q8H  . levothyroxine  50 mcg Oral QAC breakfast  . liver oil-zinc oxide   Topical TID  . magic mouthwash  5 mL Oral BID  . mouth rinse  15 mL Mouth Rinse BID  . mirtazapine  15 mg Oral QHS  . multivitamin with minerals  1 tablet Oral Daily  . potassium chloride  40 mEq Oral BID  . sertraline  75 mg Oral Daily   Continuous Infusions: . ceFEPime (MAXIPIME) IV 2 g (07/13/20 1108)  . vancomycin 750 mg (07/12/20 1652)     LOS: 5 days    Time spent: 60min  Domenic Polite, MD [facility] 07/13/2020, 12:00 PM

## 2020-07-14 DIAGNOSIS — N492 Inflammatory disorders of scrotum: Secondary | ICD-10-CM | POA: Diagnosis not present

## 2020-07-14 LAB — CBC WITH DIFFERENTIAL/PLATELET
Abs Immature Granulocytes: 0.12 10*3/uL — ABNORMAL HIGH (ref 0.00–0.07)
Basophils Absolute: 0 10*3/uL (ref 0.0–0.1)
Basophils Relative: 0 %
Eosinophils Absolute: 0.1 10*3/uL (ref 0.0–0.5)
Eosinophils Relative: 2 %
HCT: 26.7 % — ABNORMAL LOW (ref 39.0–52.0)
Hemoglobin: 8.6 g/dL — ABNORMAL LOW (ref 13.0–17.0)
Immature Granulocytes: 1 %
Lymphocytes Relative: 25 %
Lymphs Abs: 2.4 10*3/uL (ref 0.7–4.0)
MCH: 28.9 pg (ref 26.0–34.0)
MCHC: 32.2 g/dL (ref 30.0–36.0)
MCV: 89.6 fL (ref 80.0–100.0)
Monocytes Absolute: 0.7 10*3/uL (ref 0.1–1.0)
Monocytes Relative: 8 %
Neutro Abs: 6.1 10*3/uL (ref 1.7–7.7)
Neutrophils Relative %: 64 %
Platelets: 228 10*3/uL (ref 150–400)
RBC: 2.98 MIL/uL — ABNORMAL LOW (ref 4.22–5.81)
RDW: 16.3 % — ABNORMAL HIGH (ref 11.5–15.5)
WBC: 9.5 10*3/uL (ref 4.0–10.5)
nRBC: 0 % (ref 0.0–0.2)

## 2020-07-14 LAB — BASIC METABOLIC PANEL
Anion gap: 8 (ref 5–15)
BUN: 27 mg/dL — ABNORMAL HIGH (ref 8–23)
CO2: 17 mmol/L — ABNORMAL LOW (ref 22–32)
Calcium: 8.3 mg/dL — ABNORMAL LOW (ref 8.9–10.3)
Chloride: 117 mmol/L — ABNORMAL HIGH (ref 98–111)
Creatinine, Ser: 1.24 mg/dL (ref 0.61–1.24)
GFR calc Af Amer: 59 mL/min — ABNORMAL LOW (ref >60)
GFR calc non Af Amer: 51 mL/min — ABNORMAL LOW (ref >60)
Glucose, Bld: 112 mg/dL — ABNORMAL HIGH (ref 70–99)
Potassium: 4.1 mmol/L (ref 3.5–5.1)
Sodium: 142 mmol/L (ref 135–145)

## 2020-07-14 LAB — GLUCOSE, CAPILLARY
Glucose-Capillary: 105 mg/dL — ABNORMAL HIGH (ref 70–99)
Glucose-Capillary: 93 mg/dL (ref 70–99)
Glucose-Capillary: 96 mg/dL (ref 70–99)
Glucose-Capillary: 92 mg/dL (ref 70–99)
Glucose-Capillary: 84 mg/dL (ref 70–99)

## 2020-07-14 LAB — AEROBIC/ANAEROBIC CULTURE W GRAM STAIN (SURGICAL/DEEP WOUND)

## 2020-07-14 MED ORDER — SODIUM CHLORIDE 0.9 % IV SOLN
2.0000 g | Freq: Two times a day (BID) | INTRAVENOUS | Status: DC
  Administered 2020-07-14 – 2020-07-15 (×3): 2 g via INTRAVENOUS
  Filled 2020-07-14 (×3): qty 2

## 2020-07-14 NOTE — Progress Notes (Addendum)
Urology Inpatient Progress Report   Intv/Subj: No acute events overnight.  Patient more interactive and eating has improved.   Principal Problem:   Cellulitis of scrotum Active Problems:   Hypothyroidism   Hyperlipidemia   Essential hypertension   Dementia (HCC)   Stercoral colitis   Obstructive uropathy   Bilateral hydronephrosis   Sepsis due to undetermined organism ([place])   AKI (acute kidney injury) ([place])   Acute UTI (urinary tract infection)   Hyperkalemia   Pressure injury of skin   Goals of care, counseling/discussion   Palliative care by specialist   Scrotal infection   DNR (do not resuscitate)   DNI (do not intubate)  Current Facility-Administered Medications  Medication Dose Route Frequency Provider Last Rate Last Admin  . acetaminophen (TYLENOL) tablet 650 mg  650 mg Oral Q6H PRN Norm Parcel, PA-C   650 mg at 07/12/20 1012   Or  . acetaminophen (TYLENOL) suppository 650 mg  650 mg Rectal Q6H PRN Norm Parcel, PA-C      . ceFEPIme (MAXIPIME) 2 g in sodium chloride 0.9 % 100 mL IVPB  2 g Intravenous Q24H Barkley Boards R, PA-C 200 mL/hr at 07/13/20 1108 2 g at 07/13/20 1108  . Chlorhexidine Gluconate Cloth 2 % PADS 6 each  6 each Topical Daily Kipp Brood, MD   6 each at 07/13/20 1113  . heparin injection 5,000 Units  5,000 Units Subcutaneous Q8H Kipp Brood, MD   5,000 Units at 07/14/20 0622  . levothyroxine (SYNTHROID) tablet 50 mcg  50 mcg Oral QAC breakfast Norm Parcel, PA-C   50 mcg at 07/14/20 2585  . liver oil-zinc oxide (DESITIN) 40 % ointment   Topical TID Norm Parcel, [place]   Given at 07/13/20 2342  . magic mouthwash  5 mL Oral BID Norm Parcel, PA-C   5 mL at 07/13/20 1553  . MEDLINE mouth rinse  15 mL Mouth Rinse BID Kipp Brood, MD   15 mL at 07/13/20 2343  . mirtazapine (REMERON) tablet 15 mg  15 mg Oral QHS Norm Parcel, PA-C   15 mg at 07/13/20 2342  . multivitamin with minerals tablet 1 tablet  1 tablet Oral Daily  Domenic Polite, MD   1 tablet at 07/13/20 1113  . ondansetron (ZOFRAN) tablet 4 mg  4 mg Oral Q6H PRN Norm Parcel, PA-C       Or  . ondansetron [facility]) injection 4 mg  4 mg Intravenous Q6H PRN Norm Parcel, PA-C      . polyethylene glycol (MIRALAX / GLYCOLAX) packet 17 g  17 g Oral Daily PRN Candee Furbish, MD      . Resource ThickenUp Clear   Oral PRN Domenic Polite, MD      . senna-docusate (Senokot-S) tablet 1 tablet  1 tablet Oral QHS PRN Norm Parcel, PA-C   1 tablet at 07/11/20 2216  . sertraline (ZOLOFT) tablet 75 mg  75 mg Oral Daily Norm Parcel, PA-C   75 mg at 07/13/20 1114  . traMADol (ULTRAM) tablet 50 mg  50 mg Oral TID PRN Norm Parcel, PA-C   50 mg at 07/12/20 2250  . vancomycin (VANCOREADY) IVPB 750 mg/150 mL  750 mg Intravenous Q24H Thea Gist, RPH 150 mL/hr at 07/13/20 1717 750 mg at 07/13/20 1717     Objective: Vital: Vitals:   07/13/20 1635 07/13/20 2007 07/14/20 0326 07/14/20 0533  BP: 111/84 103/74 (!) 119/52 122/65  Pulse:  72 75 80 74  Resp: 17 20 20  (!) 22  Temp: 97.9 F (36.6 C) 98.4 F (36.9 C) 97.8 F (36.6 C) 98.4 F (36.9 C)  TempSrc:  Oral Oral Oral  SpO2: 99% 98% 99% 98%  Weight:  63.3 kg    Height:       I/Os: I/O last 3 completed shifts: In: 1518.8 [P.O.:1080; I.V.:438.8] Out: 1900 [Urine:1900]  Physical Exam:  General: Patient is in no apparent distress Lungs: Normal respiratory effort, chest expands symmetrically. GI:  The abdomen is soft and nontender without mass. GU: packing removed, fibrinous exduate still present over right testis, previous area of concern under left testis appears improved; no significant purulent drainage Foley: draining clear yellow urine Ext: lower extremities symmetric  Lab Results: Recent Labs    07/12/20 1219 07/13/20 0452 07/14/20 0237  WBC 8.1 8.2 9.5  HGB 7.9* 8.5* 8.6*  HCT 24.5* 26.3* 26.7*   Recent Labs    07/12/20 1219 07/13/20 0452  NA 150* 147*  K 3.2* 2.9*   CL 123* 121*  CO2 18* 17*  GLUCOSE 105* 99  BUN 32* 24*  CREATININE 1.22 1.19  CALCIUM 8.6* 8.5*   No results for input(s): LABPT, INR in the last 72 hours. No results for input(s): LABURIN in the last 72 hours. Results for orders placed or performed during the hospital encounter of 07/08/20  Culture, blood (Routine x 2)     Status: None   Collection Time: 07/08/20  3:33 PM   Specimen: BLOOD  Result Value Ref Range Status   Specimen Description BLOOD RIGHT ANTECUBITAL  Final   Special Requests   Final    BOTTLES DRAWN AEROBIC AND ANAEROBIC Blood Culture adequate volume   Culture   Final    NO GROWTH 5 DAYS Performed at [facility], [place] [address], [place], [place] [address]    Report Status 07/13/2020 FINAL  Final  Culture, blood (Routine x 2)     Status: None   Collection Time: 07/08/20  7:39 PM   Specimen: BLOOD  Result Value Ref Range Status   Specimen Description BLOOD LEFT ANTECUBITAL  Final   Special Requests   Final    BOTTLES DRAWN AEROBIC AND ANAEROBIC Blood Culture adequate volume   Culture   Final    NO GROWTH 5 DAYS Performed at [facility], [place] [address], [place], [place] [address]    Report Status 07/13/2020 FINAL  Final  Respiratory Panel by RT PCR (Flu A&B, Covid) - Nasopharyngeal Swab     Status: None   Collection Time: 07/08/20 11:30 PM   Specimen: Nasopharyngeal Swab  Result Value Ref Range Status   SARS Coronavirus 2 by RT PCR NEGATIVE NEGATIVE Final    Comment: (NOTE) SARS-CoV-2 target nucleic acids are NOT DETECTED.  The SARS-CoV-2 RNA is generally detectable in upper respiratoy specimens during the acute phase of infection. The lowest concentration of SARS-CoV-2 viral copies this assay can detect is 131 copies/mL. A negative result does not preclude SARS-Cov-2 infection and should not be used as the sole basis for treatment or other patient management decisions. A negative result may occur with  improper specimen  collection/handling, submission of specimen other than nasopharyngeal swab, presence of viral mutation(s) within the areas targeted by this assay, and inadequate number of viral copies (<131 copies/mL). A negative result must be combined with clinical observations, patient history, and epidemiological information. The expected result is Negative.  Fact Sheet for Patients:  PinkCheek.be  Fact Sheet for Healthcare Providers:  GravelBags.it  This test is no t yet approved or cleared by the [place] FDA and  has been authorized for detection and/or diagnosis of SARS-CoV-2 by FDA under an Emergency Use Authorization (EUA). This EUA will remain  in effect (meaning this test can be used) for the duration of the COVID-19 declaration under Section 564(b)(1) of the Act, 21 U.S.C. section 360bbb-3(b)(1), unless the authorization is terminated or revoked sooner.     Influenza A by PCR NEGATIVE NEGATIVE Final   Influenza B by PCR NEGATIVE NEGATIVE Final    Comment: (NOTE) The Xpert Xpress SARS-CoV-2/FLU/RSV assay is intended as an aid in  the diagnosis of influenza from Nasopharyngeal swab specimens and  should not be used as a sole basis for treatment. Nasal washings and  aspirates are unacceptable for Xpert Xpress SARS-CoV-2/FLU/RSV  testing.  Fact Sheet for Patients: PinkCheek.be  Fact Sheet for Healthcare Providers: GravelBags.it  This test is not yet approved or cleared by the [place] FDA and  has been authorized for detection and/or diagnosis of SARS-CoV-2 by  FDA under an Emergency Use Authorization (EUA). This EUA will remain  in effect (meaning this test can be used) for the duration of the  Covid-19 declaration under Section 564(b)(1) of the Act, 21  U.S.C. section 360bbb-3(b)(1), unless the authorization is  terminated or revoked. Performed at [facility], [place] [address], [place], [place] [address]   Aerobic/Anaerobic Culture (surgical/deep wound)     Status: None (Preliminary result)   Collection Time: 07/09/20  9:21 AM   Specimen: Abscess  Result Value Ref Range Status   Specimen Description ABSCESS  Final   Special Requests NONE  Final   Gram Stain   Final    RARE WBC PRESENT,BOTH PMN AND MONONUCLEAR RARE GRAM POSITIVE COCCI IN PAIRS IN CLUSTERS Performed at [facility], [place] [address], [place], [place] [address]    Culture   Final    RARE GROUP B STREP(S.AGALACTIAE)ISOLATED TESTING AGAINST S. AGALACTIAE NOT ROUTINELY PERFORMED DUE TO PREDICTABILITY OF AMP/PEN/VAN SUSCEPTIBILITY. RARE PROTEUS MIRABILIS NO ANAEROBES ISOLATED; CULTURE IN PROGRESS FOR 5 DAYS    Report Status PENDING  Incomplete   Organism ID, Bacteria PROTEUS MIRABILIS  Final      Susceptibility   Proteus mirabilis - MIC*    AMPICILLIN <=2 SENSITIVE Sensitive     CEFAZOLIN <=4 SENSITIVE Sensitive     CEFEPIME <=0.12 SENSITIVE Sensitive     CEFTAZIDIME <=1 SENSITIVE Sensitive     CEFTRIAXONE <=0.25 SENSITIVE Sensitive     CIPROFLOXACIN <=0.25 SENSITIVE Sensitive     GENTAMICIN <=1 SENSITIVE Sensitive     IMIPENEM 1 SENSITIVE Sensitive     TRIMETH/SULFA <=20 SENSITIVE Sensitive     AMPICILLIN/SULBACTAM <=2 SENSITIVE Sensitive     PIP/TAZO <=4 SENSITIVE Sensitive     * RARE PROTEUS MIRABILIS  MRSA PCR Screening     Status: None   Collection Time: 07/10/20  3:08 PM   Specimen: Nasal Mucosa; Nasopharyngeal  Result Value Ref Range Status   MRSA by PCR NEGATIVE NEGATIVE Final    Comment:        The GeneXpert MRSA Assay (FDA approved for NASAL specimens only), is one component of a comprehensive MRSA colonization surveillance program. It is not intended to diagnose MRSA infection nor to guide or monitor treatment for MRSA infections. Performed at [facility], [place] [address], [place],  [address]     Studies/Results:  No  results found.  Assessment: 86 yo man presented to ED with scrotal swelling with scrotal/perinealNSTInow POD5 s/p debridement clinically improving.    Plan: -Continue twice daily dressing changes -continue antibiotics -continue foley for urinary retention and while wound heals -Discussed next steps with plastic surgery who have recommended placement of matrix to help decrease exposed wound and help with overall dressing changes and wound healing; however, patient would need to undergo general anesthesia for this procedure -Wil contact patient's family so that they may discuss this and come up with a decision whether or not they would like to proceed (discussed with patient's son who is leaning towards proceeding with matrix on Tuesday, he will discuss with siblings and have an answer later today or tomorrow) -pt plans to dc to SNF with outpatient hospice but overall has been more interactive and eating better  Jacalyn Lefevre, MD Urology 07/14/2020, 8:38 AM

## 2020-07-14 NOTE — Progress Notes (Signed)
Pharmacy Antibiotic Note  Wayne [name] is a 86 y.o. male admitted on 07/08/2020 with cellulitis.  Pharmacy has been consulted for cefepime and vancomycin dosing.   Patient remains afebrile with WBC wnl at 9.5. Since admission, patient's renal function has improved with Scr improved to what appears to be his baseline at 1.24, eCrCl ~34 ml/min. Blood cultures remain negative to date. Abscess cultures are showing rare group B strep and pan-sensitive proteus.  Today is day 7 of vancomycin. Discussed with MD that we can discontinue vancomycin due to susceptibility of growing pathogens to cefepime and LOT of vancomycin.   Plan:  Change cefepime to 2 grams IV every 12 hours d/t improved renal function  Discontinue vancomycin Monitor renal function and UOP  Follow up with cultures, antibiotic de-escalation and LOT   Height: 5' 7.5" (171.5 cm) Weight: 63.3 kg (139 lb 8.8 oz) IBW/kg (Calculated) : 67.25  Temp (24hrs), Avg:98.2 F (36.8 [name]), Min:97.8 F (36.6 [name]), Max:98.4 F (36.9 [name])  Recent Labs  Lab 07/08/20 1519 07/08/20 1528 07/08/20 1935 07/09/20 0412 07/10/20 0039 07/10/20 0227 07/10/20 1205 07/11/20 0505 07/12/20 1219 07/13/20 0452 07/14/20 0237 07/14/20 0249  WBC  --    < >  --    < > 6.1  --   --  8.6 8.1 8.2 9.5  --   CREATININE  --    < >  --   --   --  1.66*  --  1.52* 1.22 1.19  --  1.24  LATICACIDVEN 2.0*  --  1.6  --   --   --   --   --   --   --   --   --   VANCORANDOM  --   --   --   --   --   --  6  --   --   --   --   --    < > = values in this interval not displayed.     Antimicrobials this admission: Cefepime 9/27 >> Vancomycin 9/27>> 10/3 Flagyl 9/27>>  Microbiology results: 9/28 Abscess - pan sensitive proteus mirabilis and rare GBS 9/27 BCx x2 - ngtd  9/27 COVID/Flu - negative   Wayne [name] Wayne [name]. PharmD 07/14/2020 11:17 AM

## 2020-07-14 NOTE — Progress Notes (Signed)
PROGRESS NOTE    Wayne [name]  EHU:[id number] DOB: 1926-09-02 DOA: 07/08/2020 PCP: Isaac Bliss, Rayford Halsted, MD  Brief Narrative: Frail elderly 89/M from [facility], SNF w/ cognitive deficits, chronic diastolic CHF, severe protein calorie malnutrition, failure to thrive was admitted to the ICU on 9/27 with septic shock secondary to perineal necrotizing fasciitis. -Underwent extensive debridement in the OR by urology and general surgery, remained hypotensive in PACU, admitted to the ICU on vasopressors -Blood pressure improved, transferred to [facility] service  9/30   Assessment & Plan:   Septic shock  Necrotizing fasciitis of scrotum/perineum -Treated with broad-spectrum antibiotics, pressors, IV fluids and albumin -Sepsis resolved -Blood cultures are negative, Intra-Op cultures with rare group B strep, rare Proteus, Flagyl discontinued, day 7 of IV vancomycin and cefepime, has purulent exudate, will stop vancomycin and continue cefepime -Urology following -Plastic surgery also following, plan for wound matrix mesh in the upcoming days -Palliative care also following, plan for hospice versus palliative care at the time of discharge to SNF based on clinical progress  Severe protein calorie malnutrition Adult failure to thrive Severe debility -Overall prognosis is poor -Oral intake overall poor with some good days -Palliative care following -Plan for hospice versus palliative care at SNF  Dysphagia -Acute on chronic -Discussed with SLP, will remain high aspiration risk, now on dysphagia 2 diet  -Oral intake a little better since yesterday -Discussed poor prognosis with patient's son and conservative management recommended  Acute kidney injury -In the setting of sepsis and shock, improving -Fluids were changed to D5 with potassium given Hyponatremia -IV fluids discontinued, sodium has improved today, continue to encourage p.o. intake  Bilateral hydronephrosis Chronic bladder outlet  obstruction Now urinating without issues, creatinine improving,  -Creatinine down to 1.2 now -Continue Foley catheter  Indeterminate right lower lobe level lesion -Unclear etiology, not appropriate for additional work-up at this time  Chronic anemia -Monitor, not appropriate for extensive work-up  DVT prophylaxis: Heparin subcutaneous Code Status: DNR Family Communication: Discussed with son [name] at bedside 9/30 Disposition Plan:  Status is: Inpatient  Remains inpatient appropriate because:Inpatient level of care appropriate due to severity of illness   Dispo: The patient is from: SNF              Anticipated d/c is to: SNF versus hospice, extensive discussion with son 9/30              Anticipated d/c date is: > 3 days              Patient currently is not medically stable to d/c.  Consultants:   Urology, general surgery, PCCM  Plastic surgery   Procedures: Exploration, washout and debridement of necrotic scrotum for necrotizing fasciitis on 9/28  Antimicrobials:    Subjective: -Was able to eat a little bit more yesterday, sleeping most of the time  Objective: Vitals:   07/13/20 2007 07/14/20 0326 07/14/20 0533 07/14/20 0915  BP: 103/74 (!) 119/52 122/65 114/69  Pulse: 75 80 74 65  Resp: 20 20 (!) 22 18  Temp: 98.4 F (36.9 C) 97.8 F (36.6 C) 98.4 F (36.9 C) 98.4 F (36.9 C)  TempSrc: Oral Oral Oral   SpO2: 98% 99% 98% 98%  Weight: 63.3 kg     Height:        Intake/Output Summary (Last 24 hours) at 07/14/2020 1235 Last data filed at 07/14/2020 0938 Gross per 24 hour  Intake 960 ml  Output 1000 ml  Net -40 ml   Filed  Weights   07/11/20 0304 07/11/20 2058 07/13/20 2007  Weight: 87.6 kg 58.7 kg 63.3 kg    Examination:  General exam: Frail elderly male sitting up in bed, awake alert oriented to self and partly to place, no distress CVS: S1-S2, regular rate rhythm Lungs: Decreased breath sounds at bases, otherwise clear Abdomen: Soft, mildly  distended, nontender, extensive scrotal dressing noted Extremities: 2+ edema in both feet  Skin: As above Psychiatry: Poor insight and judgment    Data Reviewed:   CBC: Recent Labs  Lab 07/10/20 0039 07/11/20 0505 07/12/20 1219 07/13/20 0452 07/14/20 0237  WBC 6.1 8.6 8.1 8.2 9.5  NEUTROABS 4.8 6.0 5.8 5.4 6.1  HGB 7.3* 7.6* 7.9* 8.5* 8.6*  HCT 23.0* 24.1* 24.5* 26.3* 26.7*  MCV 89.5 89.9 89.4 90.1 89.6  PLT 208 268 264 245 253   Basic Metabolic Panel: Recent Labs  Lab 07/10/20 0227 07/11/20 0505 07/12/20 1219 07/13/20 0452 07/14/20 0249  NA 144 147* 150* 147* 142  K 3.7 3.6 3.2* 2.9* 4.1  CL 119* 122* 123* 121* 117*  CO2 17* 16* 18* 17* 17*  GLUCOSE 79 80 105* 99 112*  BUN 66* 46* 32* 24* 27*  CREATININE 1.66* 1.52* 1.22 1.19 1.24  CALCIUM 8.4* 8.8* 8.6* 8.5* 8.3*  MG 2.0  --   --   --   --    GFR: Estimated Creatinine Clearance: 34.7 mL/min (by C-G formula based on SCr of 1.24 mg/dL). Liver Function Tests: Recent Labs  Lab 07/08/20 1528 07/09/20 0412 07/10/20 0227 07/11/20 0505  AST 44* 23 14* 12*  ALT 45* 33 18 17  ALKPHOS 213* 155* 92 89  BILITOT 0.6 0.7 1.0 1.3*  PROT 6.6 5.8* 5.3* 5.5*  ALBUMIN 2.0* 1.7* 2.3* 2.5*   No results for input(s): LIPASE, AMYLASE in the last 168 hours. No results for input(s): AMMONIA in the last 168 hours. Coagulation Profile: Recent Labs  Lab 07/08/20 1528 07/09/20 0412  INR 1.9* 1.9*   Cardiac Enzymes: No results for input(s): CKTOTAL, CKMB, CKMBINDEX, TROPONINI in the last 168 hours. BNP (last 3 results) No results for input(s): PROBNP in the last 8760 hours. HbA1C: No results for input(s): HGBA1C in the last 72 hours. CBG: Recent Labs  Lab 07/13/20 1636 07/13/20 2008 07/14/20 0105 07/14/20 0649 07/14/20 1147  GLUCAP 93 108* 105* 93 96   Lipid Profile: No results for input(s): CHOL, HDL, LDLCALC, TRIG, CHOLHDL, LDLDIRECT in the last 72 hours. Thyroid Function Tests: No results for input(s): TSH,  T4TOTAL, FREET4, T3FREE, THYROIDAB in the last 72 hours. Anemia Panel: No results for input(s): VITAMINB12, FOLATE, FERRITIN, TIBC, IRON, RETICCTPCT in the last 72 hours. Urine analysis:    Component Value Date/Time   COLORURINE BROWN (A) 07/08/2020 2232   APPEARANCEUR TURBID (A) 07/08/2020 2232   LABSPEC 1.011 07/08/2020 2232   PHURINE 6.0 07/08/2020 2232   GLUCOSEU NEGATIVE 07/08/2020 2232   HGBUR LARGE (A) 07/08/2020 2232   BILIRUBINUR NEGATIVE 07/08/2020 2232   BILIRUBINUR n 11/11/2015 1716   KETONESUR NEGATIVE 07/08/2020 2232   PROTEINUR 100 (A) 07/08/2020 2232   UROBILINOGEN 0.2 11/11/2015 1716   UROBILINOGEN 0.2 02/15/2012 1335   NITRITE NEGATIVE 07/08/2020 2232   LEUKOCYTESUR MODERATE (A) 07/08/2020 2232   Sepsis Labs: @LABRCNTIP (procalcitonin:4,lacticidven:4)  ) Recent Results (from the past 240 hour(s))  Culture, blood (Routine x 2)     Status: None   Collection Time: 07/08/20  3:33 PM   Specimen: BLOOD  Result Value Ref Range Status  Specimen Description BLOOD RIGHT ANTECUBITAL  Final   Special Requests   Final    BOTTLES DRAWN AEROBIC AND ANAEROBIC Blood Culture adequate volume   Culture   Final    NO GROWTH 5 DAYS Performed at [facility], [address] [place], [place] [address]    Report Status 07/13/2020 FINAL  Final  Culture, blood (Routine x 2)     Status: None   Collection Time: 07/08/20  7:39 PM   Specimen: BLOOD  Result Value Ref Range Status   Specimen Description BLOOD LEFT ANTECUBITAL  Final   Special Requests   Final    BOTTLES DRAWN AEROBIC AND ANAEROBIC Blood Culture adequate volume   Culture   Final    NO GROWTH 5 DAYS Performed at [facility], [place] [address], [place], [place] [address]    Report Status 07/13/2020 FINAL  Final  Respiratory Panel by RT PCR (Flu A&B, Covid) - Nasopharyngeal Swab     Status: None   Collection Time: 07/08/20 11:30 PM   Specimen: Nasopharyngeal Swab  Result Value Ref Range Status   SARS  Coronavirus 2 by RT PCR NEGATIVE NEGATIVE Final    Comment: (NOTE) SARS-CoV-2 target nucleic acids are NOT DETECTED.  The SARS-CoV-2 RNA is generally detectable in upper respiratoy specimens during the acute phase of infection. The lowest concentration of SARS-CoV-2 viral copies this assay can detect is 131 copies/mL. A negative result does not preclude SARS-Cov-2 infection and should not be used as the sole basis for treatment or other patient management decisions. A negative result may occur with  improper specimen collection/handling, submission of specimen other than nasopharyngeal swab, presence of viral mutation(s) within the areas targeted by this assay, and inadequate number of viral copies (<131 copies/mL). A negative result must be combined with clinical observations, patient history, and epidemiological information. The expected result is Negative.  Fact Sheet for Patients:  PinkCheek.be  Fact Sheet for Healthcare Providers:  GravelBags.it  This test is no t yet approved or cleared by the [place] FDA and  has been authorized for detection and/or diagnosis of SARS-CoV-2 by FDA under an Emergency Use Authorization (EUA). This EUA will remain  in effect (meaning this test can be used) for the duration of the COVID-19 declaration under Section 564(b)(1) of the Act, 21 U.S.C. section 360bbb-3(b)(1), unless the authorization is terminated or revoked sooner.     Influenza A by PCR NEGATIVE NEGATIVE Final   Influenza B by PCR NEGATIVE NEGATIVE Final    Comment: (NOTE) The Xpert Xpress SARS-CoV-2/FLU/RSV assay is intended as an aid in  the diagnosis of influenza from Nasopharyngeal swab specimens and  should not be used as a sole basis for treatment. Nasal washings and  aspirates are unacceptable for Xpert Xpress SARS-CoV-2/FLU/RSV  testing.  Fact Sheet for  Patients: PinkCheek.be  Fact Sheet for Healthcare Providers: GravelBags.it  This test is not yet approved or cleared by the [place] FDA and  has been authorized for detection and/or diagnosis of SARS-CoV-2 by  FDA under an Emergency Use Authorization (EUA). This EUA will remain  in effect (meaning this test can be used) for the duration of the  Covid-19 declaration under Section 564(b)(1) of the Act, 21  U.S.C. section 360bbb-3(b)(1), unless the authorization is  terminated or revoked. Performed at [facility], [place] [address], [place], [place] [address]   Aerobic/Anaerobic Culture (surgical/deep wound)     Status: None   Collection Time: 07/09/20  9:21 AM   Specimen: Abscess  Result Value Ref Range Status   Specimen Description ABSCESS  Final   Special Requests NONE  Final   Gram Stain   Final    RARE WBC PRESENT,BOTH PMN AND MONONUCLEAR RARE GRAM POSITIVE COCCI IN PAIRS IN CLUSTERS    Culture   Final    RARE GROUP B STREP(S.AGALACTIAE)ISOLATED TESTING AGAINST S. AGALACTIAE NOT ROUTINELY PERFORMED DUE TO PREDICTABILITY OF AMP/PEN/VAN SUSCEPTIBILITY. RARE PROTEUS MIRABILIS NO ANAEROBES ISOLATED Performed at [facility], [place] [address], [place], [place] [address]    Report Status 07/14/2020 FINAL  Final   Organism ID, Bacteria PROTEUS MIRABILIS  Final      Susceptibility   Proteus mirabilis - MIC*    AMPICILLIN <=2 SENSITIVE Sensitive     CEFAZOLIN <=4 SENSITIVE Sensitive     CEFEPIME <=0.12 SENSITIVE Sensitive     CEFTAZIDIME <=1 SENSITIVE Sensitive     CEFTRIAXONE <=0.25 SENSITIVE Sensitive     CIPROFLOXACIN <=0.25 SENSITIVE Sensitive     GENTAMICIN <=1 SENSITIVE Sensitive     IMIPENEM 1 SENSITIVE Sensitive     TRIMETH/SULFA <=20 SENSITIVE Sensitive     AMPICILLIN/SULBACTAM <=2 SENSITIVE Sensitive     PIP/TAZO <=4 SENSITIVE Sensitive     * RARE PROTEUS MIRABILIS  MRSA PCR Screening      Status: None   Collection Time: 07/10/20  3:08 PM   Specimen: Nasal Mucosa; Nasopharyngeal  Result Value Ref Range Status   MRSA by PCR NEGATIVE NEGATIVE Final    Comment:        The GeneXpert MRSA Assay (FDA approved for NASAL specimens only), is one component of a comprehensive MRSA colonization surveillance program. It is not intended to diagnose MRSA infection nor to guide or monitor treatment for MRSA infections. Performed at [facility], [place] [address], [place], [place] [address]         Scheduled Meds: . Chlorhexidine Gluconate Cloth  6 each Topical Daily  . heparin injection (subcutaneous)  5,000 Units Subcutaneous Q8H  . levothyroxine  50 mcg Oral QAC breakfast  . liver oil-zinc oxide   Topical TID  . magic mouthwash  5 mL Oral BID  . mouth rinse  15 mL Mouth Rinse BID  . mirtazapine  15 mg Oral QHS  . multivitamin with minerals  1 tablet Oral Daily  . sertraline  75 mg Oral Daily   Continuous Infusions: . ceFEPime (MAXIPIME) IV       LOS: 6 days    Time spent: 29min  Domenic Polite, MD [facility] 07/14/2020, 12:35 PM

## 2020-07-15 DIAGNOSIS — Z789 Other specified health status: Secondary | ICD-10-CM

## 2020-07-15 DIAGNOSIS — N1330 Unspecified hydronephrosis: Secondary | ICD-10-CM | POA: Diagnosis not present

## 2020-07-15 DIAGNOSIS — N390 Urinary tract infection, site not specified: Secondary | ICD-10-CM | POA: Diagnosis not present

## 2020-07-15 DIAGNOSIS — N179 Acute kidney failure, unspecified: Secondary | ICD-10-CM | POA: Diagnosis not present

## 2020-07-15 DIAGNOSIS — N492 Inflammatory disorders of scrotum: Secondary | ICD-10-CM | POA: Diagnosis not present

## 2020-07-15 LAB — CBC
HCT: 26 % — ABNORMAL LOW (ref 39.0–52.0)
Hemoglobin: 8.1 g/dL — ABNORMAL LOW (ref 13.0–17.0)
MCH: 27.8 pg (ref 26.0–34.0)
MCHC: 31.2 g/dL (ref 30.0–36.0)
MCV: 89.3 fL (ref 80.0–100.0)
Platelets: 171 10*3/uL (ref 150–400)
RBC: 2.91 MIL/uL — ABNORMAL LOW (ref 4.22–5.81)
RDW: 16.1 % — ABNORMAL HIGH (ref 11.5–15.5)
WBC: 7 10*3/uL (ref 4.0–10.5)
nRBC: 0 % (ref 0.0–0.2)

## 2020-07-15 LAB — BASIC METABOLIC PANEL
Anion gap: 5 (ref 5–15)
BUN: 26 mg/dL — ABNORMAL HIGH (ref 8–23)
CO2: 18 mmol/L — ABNORMAL LOW (ref 22–32)
Calcium: 8.2 mg/dL — ABNORMAL LOW (ref 8.9–10.3)
Chloride: 119 mmol/L — ABNORMAL HIGH (ref 98–111)
Creatinine, Ser: 1.15 mg/dL (ref 0.61–1.24)
GFR calc Af Amer: >60 mL/min (ref >60)
GFR calc non Af Amer: 55 mL/min — ABNORMAL LOW (ref >60)
Glucose, Bld: 90 mg/dL (ref 70–99)
Potassium: 3.7 mmol/L (ref 3.5–5.1)
Sodium: 142 mmol/L (ref 135–145)

## 2020-07-15 LAB — GLUCOSE, CAPILLARY
Glucose-Capillary: 81 mg/dL (ref 70–99)
Glucose-Capillary: 86 mg/dL (ref 70–99)
Glucose-Capillary: 86 mg/dL (ref 70–99)
Glucose-Capillary: 87 mg/dL (ref 70–99)
Glucose-Capillary: 89 mg/dL (ref 70–99)
Glucose-Capillary: 89 mg/dL (ref 70–99)
Glucose-Capillary: 93 mg/dL (ref 70–99)

## 2020-07-15 MED ORDER — SODIUM CHLORIDE 0.9 % IV SOLN
INTRAVENOUS | Status: DC | PRN
  Administered 2020-07-15 – 2020-07-17 (×3): 250 mL via INTRAVENOUS

## 2020-07-15 MED ORDER — DOCUSATE SODIUM 100 MG PO CAPS
100.0000 mg | ORAL_CAPSULE | Freq: Two times a day (BID) | ORAL | Status: DC
  Filled 2020-07-15: qty 1

## 2020-07-15 MED ORDER — POLYETHYLENE GLYCOL 3350 17 G PO PACK
17.0000 g | PACK | Freq: Every day | ORAL | Status: DC
  Administered 2020-07-15 – 2020-07-19 (×4): 17 g via ORAL
  Filled 2020-07-15 (×5): qty 1

## 2020-07-15 MED ORDER — BISACODYL 10 MG RE SUPP: 10.0000 mg | Freq: Every day | RECTAL | Status: DC | PRN

## 2020-07-15 MED ORDER — DOCUSATE SODIUM 50 MG/5ML PO LIQD
100.0000 mg | Freq: Two times a day (BID) | ORAL | Status: DC
  Administered 2020-07-15 – 2020-07-19 (×8): 100 mg via ORAL
  Filled 2020-07-15 (×11): qty 10

## 2020-07-15 NOTE — Progress Notes (Signed)
°  Speech Language Pathology Treatment: Dysphagia  Patient Details Name: Wayne [name] MRN: [id number] DOB: 12-Mar-1926 Today's Date: 07/15/2020 Time: 0955-1006 SLP Time Calculation (min) (ACUTE ONLY): 11 min  Assessment / Plan / Recommendation Clinical Impression  Pt seen for swallow intervention (no family present). Minimal verbal cues needed with pt self administering sips thin water after oral care followed by nectar thick juice and peach. Stated swallow precautions of eating in upright position and with slight prompt, small sips. Delayed cough after water as expected. At baseline he is not congested and vocal quality not wet during this session. Overall, prognosis of chronic dysphagia for significant improvements, is small. Having procedure in OR tomorrow which will likely determine treatment plan/course of care per Palliative care note. Continue Dys 2, nectar and water protocol.     HPI HPI: Wayne [name] is a 86 y.o. male with medical history significant of cervical fusion, anxiety, dementia, chronic back and neck pain, unspecified skin cancer, chronic diastolic CHF, constipation, BPH, GERD, hyperlipidemia, hypertension, hypothyroidism, peripheral edema, scoliosis, admitted with scrotal edema and erythema. Underwent debridement due to severe perineal infection (necrotizing sof tissue infection) with postoperative shock. Suspected to have chronic aspiration. Palliative care meeting with son 9/28. CXR No acute disease      SLP Plan  Continue with current plan of care       Recommendations  Diet recommendations: Dysphagia 2 (fine chop);Nectar-thick liquid;Other(comment) (water protocol) Liquids provided via: Cup;Straw (water via cup- no straw) Medication Administration: Crushed with puree Supervision: Patient able to self feed;Staff to assist with self feeding;Full supervision/cueing for compensatory strategies Compensations: Slow rate;Small sips/bites;Lingual sweep for clearance of  pocketing Postural Changes and/or Swallow Maneuvers: Seated upright 90 degrees                Oral Care Recommendations: Oral care BID Follow up Recommendations: Skilled Nursing facility SLP Visit Diagnosis: Dysphagia, pharyngeal phase (R13.13) Plan: Continue with current plan of care                       [name] 07/15/2020, 10:14 AM [name] Colvin Caroli.Ed Risk analyst [phone] Office [phone]

## 2020-07-15 NOTE — Plan of Care (Signed)
°  Problem: Nutrition: Goal: Adequate nutrition will be maintained Outcome: Progressing  Patient was able to feed himself some lunch today.

## 2020-07-15 NOTE — Progress Notes (Signed)
[facility] Progress Note  6 Days Post-Op  Subjective: CC-  Alert and oriented this morning. Tolerating diet but not eating much. No BM since admission. Tolerating dressing changes well. Note from urology yesterday states they are discussing with family whether or not they want patient to undergo anesthesia again. WBC 7, afebrile  Objective: Vital signs in last 24 hours: Temp:  [97.8 F (36.6 C)-98.7 F (37.1 C)] 98.7 F (37.1 C) (10/04 0539) Pulse Rate:  [66-76] 70 (10/04 0539) Resp:  [17-20] 20 (10/04 0539) BP: (119-125)/(65-76) 122/74 (10/04 0539) SpO2:  [97 %-99 %] 97 % (10/04 0539) Last BM Date:  (PTA)  Intake/Output from previous day: 10/03 0701 - 10/04 0700 In: 940 [P.O.:840; IV Piggyback:100] Out: 1200 [Urine:1200] Intake/Output this shift: Total I/O In: 420 [P.O.:420] Out: 550 [Urine:550]  PE: Gen:  Frail, alert, NAD, pleasant Pulm:  rate and effort normal Abd: Soft, NT/ND GU: foley in place. Persistent fibrinous exudate on testes R>L, no induration or significant cellulitis noted, no necrosis     Lab Results:  Recent Labs    07/14/20 0237 07/15/20 0504  WBC 9.5 7.0  HGB 8.6* 8.1*  HCT 26.7* 26.0*  PLT 228 171   BMET Recent Labs    07/14/20 0249 07/15/20 0504  NA 142 142  K 4.1 3.7  CL 117* 119*  CO2 17* 18*  GLUCOSE 112* 90  BUN 27* 26*  CREATININE 1.24 1.15  CALCIUM 8.3* 8.2*   PT/INR No results for input(s): LABPROT, INR in the last 72 hours. CMP     Component Value Date/Time   NA 142 07/15/2020 0504   K 3.7 07/15/2020 0504   CL 119 (H) 07/15/2020 0504   CO2 18 (L) 07/15/2020 0504   GLUCOSE 90 07/15/2020 0504   GLUCOSE 102 (H) 07/27/2006 1325   BUN 26 (H) 07/15/2020 0504   CREATININE 1.15 07/15/2020 0504   CALCIUM 8.2 (L) 07/15/2020 0504   PROT 5.5 (L) 07/11/2020 0505   ALBUMIN 2.5 (L) 07/11/2020 0505   AST 12 (L) 07/11/2020 0505   ALT 17 07/11/2020 0505   ALKPHOS 89 07/11/2020 0505   BILITOT 1.3 (H)  07/11/2020 0505   GFRNONAA 55 (L) 07/15/2020 0504   GFRAA >60 07/15/2020 0504   Lipase  No results found for: LIPASE     Studies/Results: No results found.  Anti-infectives: Anti-infectives (From admission, onward)   Start     Dose/Rate Route Frequency Ordered Stop   07/14/20 2200  ceFEPIme (MAXIPIME) 2 g in sodium chloride 0.9 % 100 mL IVPB        2 g 200 mL/hr over 30 Minutes Intravenous Every 12 hours 07/14/20 1118     07/10/20 1700  vancomycin (VANCOREADY) IVPB 750 mg/150 mL  Status:  Discontinued        750 mg 150 mL/hr over 60 Minutes Intravenous Every 24 hours 07/10/20 1605 07/14/20 1113   07/10/20 1500  vancomycin (VANCOREADY) IVPB 750 mg/150 mL  Status:  Discontinued        750 mg 150 mL/hr over 60 Minutes Intravenous  Once 07/10/20 1358 07/10/20 1605   07/09/20 2000  ceFEPIme (MAXIPIME) 2 g in sodium chloride 0.9 % 100 mL IVPB  Status:  Discontinued        2 g 200 mL/hr over 30 Minutes Intravenous Every 24 hours 07/08/20 1955 07/14/20 1118   07/09/20 0600  metroNIDAZOLE (FLAGYL) IVPB 500 mg  Status:  Discontinued        500 mg 100 mL/hr  over 60 Minutes Intravenous Every 8 hours 07/09/20 0332 07/12/20 1439   07/08/20 2309  vancomycin variable dose per unstable renal function (pharmacist dosing)  Status:  Discontinued         Does not apply See admin instructions 07/08/20 2309 07/10/20 1605   07/08/20 2215  metroNIDAZOLE (FLAGYL) IVPB 500 mg        500 mg 100 mL/hr over 60 Minutes Intravenous  Once 07/08/20 2207 07/08/20 2327   07/08/20 1945  ceFEPIme (MAXIPIME) 2 g in sodium chloride 0.9 % 100 mL IVPB        2 g 200 mL/hr over 30 Minutes Intravenous  Once 07/08/20 1933 07/08/20 2033   07/08/20 1900  vancomycin (VANCOCIN) IVPB 1000 mg/200 mL premix        1,000 mg 200 mL/hr over 60 Minutes Intravenous  Once 07/08/20 1853 07/08/20 2220       Assessment/Plan Malnutrition FTT Dysphagia AKI Chronic bladder outlet obstruction - foley Chronic anemia Code status  DNR  NSTI scrotum and perineum S/p debridement perineum 9/28 Dr. Donne Hazel and Dr. Claudia Desanctis - POD#6 - cultures PROTEUS MIRABILIS pan-sensitive - WBC 7, afebrile - Plastics/urology following, planning for application of wound matrix this week if patient/family decide to proceed - continue BID wet to dry dressing changes  ID - maxipime 9/27>> FEN - D2 diet. Add miralax/colace VTE - SCDs, sq heparin Foley - in place Follow up - TBD    LOS: 7 days    Wellington Hampshire, [facility] 07/15/2020, 9:18 AM Please see Amion for pager number during day hours 7:00am-4:30pm

## 2020-07-15 NOTE — Progress Notes (Signed)
PROGRESS NOTE    Wayne [name]  CWC:[id number] DOB: 26-Mar-1926 DOA: 07/08/2020 PCP: Isaac Bliss, Rayford Halsted, MD  Brief Narrative: Frail elderly 89/M from [facility], SNF w/ cognitive deficits, chronic diastolic CHF, severe protein calorie malnutrition, failure to thrive was admitted to the ICU on 9/27 with septic shock secondary to perineal necrotizing fasciitis. -Underwent extensive debridement in the OR by urology and general surgery, remained hypotensive in PACU, admitted to the ICU on vasopressors -Blood pressure improved, transferred to [facility] service  9/30  Assessment & Plan:   Septic shock  Necrotizing fasciitis of scrotum/perineum -Treated with broad-spectrum antibiotics, pressors, IV fluids and albumin -Sepsis resolved -Blood cultures are negative, Intra-Op cultures with rare group B strep, rare Proteus, vancomycin and Flagyl discontinued after 7 days, continue cefepime -Urology following -Plastic surgery also following, plan for wound matrix mesh this week -Palliative care also following, plan for hospice versus palliative care at the time of discharge to SNF based on clinical progress  Severe protein calorie malnutrition Adult failure to thrive Severe debility Third spacing and edema -Overall prognosis is poor -Oral intake overall poor with some good days -Palliative care following -Plan for hospice versus palliative care at SNF  Dysphagia -Acute on chronic -Discussed with SLP, will remain high aspiration risk, now on dysphagia 2 diet  -Oral intake a little better since yesterday -Discussed poor prognosis with patient's son and conservative management recommended  Acute kidney injury -In the setting of sepsis and shock, improving -Fluids were changed to D5 with potassium in the setting of hyponatremia -IV fluids discontinued, sodium improved, continue to encourage p.o. intake  Bilateral hydronephrosis Chronic bladder outlet obstruction Now urinating without issues,  creatinine improving,  -Creatinine down to 1.2 now -Continue Foley catheter  Indeterminate right lower lobe level lesion -Unclear etiology, not appropriate for additional work-up at this time  Chronic anemia -Monitor, not appropriate for extensive work-up  Lower extremity edema -Due to third spacing, from fluid resuscitation in the setting of shock and sepsis -Diuretics deferred due to poor oral intake and hypernatremia  DVT prophylaxis: Heparin subcutaneous Code Status: DNR Family Communication: Discussed with son [name] at bedside 10/1 Disposition Plan:  Status is: Inpatient  Remains inpatient appropriate because:Inpatient level of care appropriate due to severity of illness   Dispo: The patient is from: SNF              Anticipated d/c is to: SNF versus hospice, extensive discussion with son 9/30              Anticipated d/c date is: > 3 days              Patient currently is not medically stable to d/c.  Consultants:   Urology, general surgery, PCCM  Plastic surgery   Procedures: Exploration, washout and debridement of necrotic scrotum for necrotizing fasciitis on 9/28  Antimicrobials:    Subjective: -No events overnight, oral intake marginally better, sleeps and rests most of the time  Objective: Vitals:   07/14/20 1639 07/14/20 2041 07/15/20 0539 07/15/20 0939  BP: 119/76 125/65 122/74 110/64  Pulse: 76 66 70 71  Resp: 17  20 16   Temp: 98.2 F (36.8 C) 97.8 F (36.6 C) 98.7 F (37.1 C) 97.6 F (36.4 C)  TempSrc:  Oral Oral Oral  SpO2: 97% 99% 97% 100%  Weight:      Height:        Intake/Output Summary (Last 24 hours) at 07/15/2020 1347 Last data filed at 07/15/2020 1200 Gross per  24 hour  Intake 1540 ml  Output 1550 ml  Net -10 ml   Filed Weights   07/11/20 0304 07/11/20 2058 07/13/20 2007  Weight: 87.6 kg 58.7 kg 63.3 kg    Examination:  General exam: Frail elderly male sitting up in bed, awake alert oriented to self and partly to place,  no distress CVS: S1-S2, regular rate rhythm Lungs: Decreased breath sounds to bases otherwise clear Abdomen: Soft, slightly distended, nontender, extensive scrotal dressing noted Extremities: 2+ edema in both feet R>L Skin: As above Psychiatry: Poor insight and judgment    Data Reviewed:   CBC: Recent Labs  Lab 07/10/20 0039 07/10/20 0039 07/11/20 0505 07/12/20 1219 07/13/20 0452 07/14/20 0237 07/15/20 0504  WBC 6.1   < > 8.6 8.1 8.2 9.5 7.0  NEUTROABS 4.8  --  6.0 5.8 5.4 6.1  --   HGB 7.3*   < > 7.6* 7.9* 8.5* 8.6* 8.1*  HCT 23.0*   < > 24.1* 24.5* 26.3* 26.7* 26.0*  MCV 89.5   < > 89.9 89.4 90.1 89.6 89.3  PLT 208   < > 268 264 245 228 171   < > = values in this interval not displayed.   Basic Metabolic Panel: Recent Labs  Lab 07/10/20 0227 07/10/20 0227 07/11/20 0505 07/12/20 1219 07/13/20 0452 07/14/20 0249 07/15/20 0504  NA 144   < > 147* 150* 147* 142 142  K 3.7   < > 3.6 3.2* 2.9* 4.1 3.7  CL 119*   < > 122* 123* 121* 117* 119*  CO2 17*   < > 16* 18* 17* 17* 18*  GLUCOSE 79   < > 80 105* 99 112* 90  BUN 66*   < > 46* 32* 24* 27* 26*  CREATININE 1.66*   < > 1.52* 1.22 1.19 1.24 1.15  CALCIUM 8.4*   < > 8.8* 8.6* 8.5* 8.3* 8.2*  MG 2.0  --   --   --   --   --   --    < > = values in this interval not displayed.   GFR: Estimated Creatinine Clearance: 37.5 mL/min (by C-G formula based on SCr of 1.15 mg/dL). Liver Function Tests: Recent Labs  Lab 07/08/20 1528 07/09/20 0412 07/10/20 0227 07/11/20 0505  AST 44* 23 14* 12*  ALT 45* 33 18 17  ALKPHOS 213* 155* 92 89  BILITOT 0.6 0.7 1.0 1.3*  PROT 6.6 5.8* 5.3* 5.5*  ALBUMIN 2.0* 1.7* 2.3* 2.5*   No results for input(s): LIPASE, AMYLASE in the last 168 hours. No results for input(s): AMMONIA in the last 168 hours. Coagulation Profile: Recent Labs  Lab 07/08/20 1528 07/09/20 0412  INR 1.9* 1.9*   Cardiac Enzymes: No results for input(s): CKTOTAL, CKMB, CKMBINDEX, TROPONINI in the last 168  hours. BNP (last 3 results) No results for input(s): PROBNP in the last 8760 hours. HbA1C: No results for input(s): HGBA1C in the last 72 hours. CBG: Recent Labs  Lab 07/14/20 2038 07/15/20 0022 07/15/20 0535 07/15/20 0736 07/15/20 1112  GLUCAP 84 86 86 81 93   Lipid Profile: No results for input(s): CHOL, HDL, LDLCALC, TRIG, CHOLHDL, LDLDIRECT in the last 72 hours. Thyroid Function Tests: No results for input(s): TSH, T4TOTAL, FREET4, T3FREE, THYROIDAB in the last 72 hours. Anemia Panel: No results for input(s): VITAMINB12, FOLATE, FERRITIN, TIBC, IRON, RETICCTPCT in the last 72 hours. Urine analysis:    Component Value Date/Time   COLORURINE BROWN (A) 07/08/2020 2232   APPEARANCEUR TURBID (  A) 07/08/2020 2232   LABSPEC 1.011 07/08/2020 2232   PHURINE 6.0 07/08/2020 2232   GLUCOSEU NEGATIVE 07/08/2020 2232   HGBUR LARGE (A) 07/08/2020 2232   BILIRUBINUR NEGATIVE 07/08/2020 2232   BILIRUBINUR n 11/11/2015 1716   KETONESUR NEGATIVE 07/08/2020 2232   PROTEINUR 100 (A) 07/08/2020 2232   UROBILINOGEN 0.2 11/11/2015 1716   UROBILINOGEN 0.2 02/15/2012 1335   NITRITE NEGATIVE 07/08/2020 2232   LEUKOCYTESUR MODERATE (A) 07/08/2020 2232   Sepsis Labs: @LABRCNTIP (procalcitonin:4,lacticidven:4)  ) Recent Results (from the past 240 hour(s))  Culture, blood (Routine x 2)     Status: None   Collection Time: 07/08/20  3:33 PM   Specimen: BLOOD  Result Value Ref Range Status   Specimen Description BLOOD RIGHT ANTECUBITAL  Final   Special Requests   Final    BOTTLES DRAWN AEROBIC AND ANAEROBIC Blood Culture adequate volume   Culture   Final    NO GROWTH 5 DAYS Performed at [facility], [place] [address] [place], [place] [address]    Report Status 07/13/2020 FINAL  Final  Culture, blood (Routine x 2)     Status: None   Collection Time: 07/08/20  7:39 PM   Specimen: BLOOD  Result Value Ref Range Status   Specimen Description BLOOD LEFT ANTECUBITAL  Final   Special  Requests   Final    BOTTLES DRAWN AEROBIC AND ANAEROBIC Blood Culture adequate volume   Culture   Final    NO GROWTH 5 DAYS Performed at [facility], [place] [address], [place], [place] [address]    Report Status 07/13/2020 FINAL  Final  Respiratory Panel by RT PCR (Flu A&B, Covid) - Nasopharyngeal Swab     Status: None   Collection Time: 07/08/20 11:30 PM   Specimen: Nasopharyngeal Swab  Result Value Ref Range Status   SARS Coronavirus 2 by RT PCR NEGATIVE NEGATIVE Final    Comment: (NOTE) SARS-CoV-2 target nucleic acids are NOT DETECTED.  The SARS-CoV-2 RNA is generally detectable in upper respiratoy specimens during the acute phase of infection. The lowest concentration of SARS-CoV-2 viral copies this assay can detect is 131 copies/mL. A negative result does not preclude SARS-Cov-2 infection and should not be used as the sole basis for treatment or other patient management decisions. A negative result may occur with  improper specimen collection/handling, submission of specimen other than nasopharyngeal swab, presence of viral mutation(s) within the areas targeted by this assay, and inadequate number of viral copies (<131 copies/mL). A negative result must be combined with clinical observations, patient history, and epidemiological information. The expected result is Negative.  Fact Sheet for Patients:  PinkCheek.be  Fact Sheet for Healthcare Providers:  GravelBags.it  This test is no t yet approved or cleared by the [place] FDA and  has been authorized for detection and/or diagnosis of SARS-CoV-2 by FDA under an Emergency Use Authorization (EUA). This EUA will remain  in effect (meaning this test can be used) for the duration of the COVID-19 declaration under Section 564(b)(1) of the Act, 21 U.S.C. section 360bbb-3(b)(1), unless the authorization is terminated or revoked sooner.     Influenza A by PCR  NEGATIVE NEGATIVE Final   Influenza B by PCR NEGATIVE NEGATIVE Final    Comment: (NOTE) The Xpert Xpress SARS-CoV-2/FLU/RSV assay is intended as an aid in  the diagnosis of influenza from Nasopharyngeal swab specimens and  should not be used as a sole basis for treatment. Nasal washings and  aspirates are unacceptable  for Xpert Xpress SARS-CoV-2/FLU/RSV  testing.  Fact Sheet for Patients: PinkCheek.be  Fact Sheet for Healthcare Providers: GravelBags.it  This test is not yet approved or cleared by the [place] FDA and  has been authorized for detection and/or diagnosis of SARS-CoV-2 by  FDA under an Emergency Use Authorization (EUA). This EUA will remain  in effect (meaning this test can be used) for the duration of the  Covid-19 declaration under Section 564(b)(1) of the Act, 21  U.S.C. section 360bbb-3(b)(1), unless the authorization is  terminated or revoked. Performed at [facility], [place] [address], [place], [place] [address]   Aerobic/Anaerobic Culture (surgical/deep wound)     Status: None   Collection Time: 07/09/20  9:21 AM   Specimen: Abscess  Result Value Ref Range Status   Specimen Description ABSCESS  Final   Special Requests NONE  Final   Gram Stain   Final    RARE WBC PRESENT,BOTH PMN AND MONONUCLEAR RARE GRAM POSITIVE COCCI IN PAIRS IN CLUSTERS    Culture   Final    RARE GROUP B STREP(S.AGALACTIAE)ISOLATED TESTING AGAINST S. AGALACTIAE NOT ROUTINELY PERFORMED DUE TO PREDICTABILITY OF AMP/PEN/VAN SUSCEPTIBILITY. RARE PROTEUS MIRABILIS NO ANAEROBES ISOLATED Performed at [facility], [place] [address], [place], [place] [address]    Report Status 07/14/2020 FINAL  Final   Organism ID, Bacteria PROTEUS MIRABILIS  Final      Susceptibility   Proteus mirabilis - MIC*    AMPICILLIN <=2 SENSITIVE Sensitive     CEFAZOLIN <=4 SENSITIVE Sensitive     CEFEPIME <=0.12 SENSITIVE Sensitive      CEFTAZIDIME <=1 SENSITIVE Sensitive     CEFTRIAXONE <=0.25 SENSITIVE Sensitive     CIPROFLOXACIN <=0.25 SENSITIVE Sensitive     GENTAMICIN <=1 SENSITIVE Sensitive     IMIPENEM 1 SENSITIVE Sensitive     TRIMETH/SULFA <=20 SENSITIVE Sensitive     AMPICILLIN/SULBACTAM <=2 SENSITIVE Sensitive     PIP/TAZO <=4 SENSITIVE Sensitive     * RARE PROTEUS MIRABILIS  MRSA PCR Screening     Status: None   Collection Time: 07/10/20  3:08 PM   Specimen: Nasal Mucosa; Nasopharyngeal  Result Value Ref Range Status   MRSA by PCR NEGATIVE NEGATIVE Final    Comment:        The GeneXpert MRSA Assay (FDA approved for NASAL specimens only), is one component of a comprehensive MRSA colonization surveillance program. It is not intended to diagnose MRSA infection nor to guide or monitor treatment for MRSA infections. Performed at [facility], [place] [address], [place],  [address]     Scheduled Meds: . Chlorhexidine Gluconate Cloth  6 each Topical Daily  . docusate  100 mg Oral BID  . heparin injection (subcutaneous)  5,000 Units Subcutaneous Q8H  . levothyroxine  50 mcg Oral QAC breakfast  . liver oil-zinc oxide   Topical TID  . magic mouthwash  5 mL Oral BID  . mouth rinse  15 mL Mouth Rinse BID  . mirtazapine  15 mg Oral QHS  . multivitamin with minerals  1 tablet Oral Daily  . polyethylene glycol  17 g Oral Daily  . sertraline  75 mg Oral Daily   Continuous Infusions: . ceFEPime (MAXIPIME) IV 2 g (07/15/20 1142)     LOS: 7 days    Time spent: 37min  Domenic Polite, MD [facility] 07/15/2020, 1:47 PM

## 2020-07-16 ENCOUNTER — Inpatient Hospital Stay (HOSPITAL_COMMUNITY): Payer: Medicare Other | Admitting: Anesthesiology

## 2020-07-16 ENCOUNTER — Encounter (HOSPITAL_COMMUNITY): Payer: Self-pay | Admitting: Internal Medicine

## 2020-07-16 ENCOUNTER — Encounter (HOSPITAL_COMMUNITY)
Admission: EM | Disposition: A | Discharge status: Skilled Nursing Facility | Payer: Self-pay | Source: Home / Self Care | Attending: Internal Medicine

## 2020-07-16 DIAGNOSIS — Z20822 Contact with and (suspected) exposure to covid-19: Secondary | ICD-10-CM | POA: Diagnosis not present

## 2020-07-16 DIAGNOSIS — R6521 Severe sepsis with septic shock: Secondary | ICD-10-CM | POA: Diagnosis not present

## 2020-07-16 DIAGNOSIS — N492 Inflammatory disorders of scrotum: Secondary | ICD-10-CM

## 2020-07-16 DIAGNOSIS — A419 Sepsis, unspecified organism: Secondary | ICD-10-CM | POA: Diagnosis not present

## 2020-07-16 DIAGNOSIS — M726 Necrotizing fasciitis: Secondary | ICD-10-CM | POA: Diagnosis not present

## 2020-07-16 HISTORY — PX: APPLICATION OF A-CELL OF EXTREMITY: SHX6303

## 2020-07-16 HISTORY — PX: INCISION AND DRAINAGE OF WOUND: SHX1803

## 2020-07-16 LAB — CBC
HCT: 25.8 % — ABNORMAL LOW (ref 39.0–52.0)
Hemoglobin: 8 g/dL — ABNORMAL LOW (ref 13.0–17.0)
MCH: 28.3 pg (ref 26.0–34.0)
MCHC: 31 g/dL (ref 30.0–36.0)
MCV: 91.2 fL (ref 80.0–100.0)
Platelets: 155 10*3/uL (ref 150–400)
RBC: 2.83 MIL/uL — ABNORMAL LOW (ref 4.22–5.81)
RDW: 16.6 % — ABNORMAL HIGH (ref 11.5–15.5)
WBC: 8.2 10*3/uL (ref 4.0–10.5)
nRBC: 0 % (ref 0.0–0.2)

## 2020-07-16 LAB — BASIC METABOLIC PANEL
Anion gap: 8 (ref 5–15)
BUN: 26 mg/dL — ABNORMAL HIGH (ref 8–23)
CO2: 16 mmol/L — ABNORMAL LOW (ref 22–32)
Calcium: 8.2 mg/dL — ABNORMAL LOW (ref 8.9–10.3)
Chloride: 117 mmol/L — ABNORMAL HIGH (ref 98–111)
Creatinine, Ser: 1.09 mg/dL (ref 0.61–1.24)
GFR calc Af Amer: >60 mL/min (ref >60)
GFR calc non Af Amer: 59 mL/min — ABNORMAL LOW (ref >60)
Glucose, Bld: 87 mg/dL (ref 70–99)
Potassium: 4 mmol/L (ref 3.5–5.1)
Sodium: 141 mmol/L (ref 135–145)

## 2020-07-16 LAB — GLUCOSE, CAPILLARY
Glucose-Capillary: 76 mg/dL (ref 70–99)
Glucose-Capillary: 76 mg/dL (ref 70–99)
Glucose-Capillary: 79 mg/dL (ref 70–99)
Glucose-Capillary: 89 mg/dL (ref 70–99)
Glucose-Capillary: 97 mg/dL (ref 70–99)
Glucose-Capillary: 99 mg/dL (ref 70–99)

## 2020-07-16 SURGERY — IRRIGATION AND DEBRIDEMENT WOUND
Anesthesia: General | Site: Scrotum

## 2020-07-16 MED ORDER — LIDOCAINE-EPINEPHRINE 1 %-1:100000 IJ SOLN
INTRAMUSCULAR | Status: AC
  Filled 2020-07-16: qty 1

## 2020-07-16 MED ORDER — 0.9 % SODIUM CHLORIDE (POUR BTL) OPTIME
TOPICAL | Status: DC | PRN
  Administered 2020-07-16: 1000 mL

## 2020-07-16 MED ORDER — PHENYLEPHRINE HCL-NACL 10-0.9 MG/250ML-% IV SOLN
INTRAVENOUS | Status: DC | PRN
  Administered 2020-07-16: 25 ug/min via INTRAVENOUS

## 2020-07-16 MED ORDER — EPHEDRINE SULFATE-NACL 50-0.9 MG/10ML-% IV SOSY
PREFILLED_SYRINGE | INTRAVENOUS | Status: DC | PRN
  Administered 2020-07-16: 10 mg via INTRAVENOUS

## 2020-07-16 MED ORDER — FENTANYL CITRATE (PF) 100 MCG/2ML IJ SOLN
INTRAMUSCULAR | Status: DC | PRN
  Administered 2020-07-16 (×2): 25 ug via INTRAVENOUS

## 2020-07-16 MED ORDER — ONDANSETRON HCL 4 MG/2ML IJ SOLN
INTRAMUSCULAR | Status: DC | PRN
  Administered 2020-07-16: 4 mg via INTRAVENOUS

## 2020-07-16 MED ORDER — ONDANSETRON HCL 4 MG/2ML IJ SOLN
INTRAMUSCULAR | Status: AC
  Filled 2020-07-16: qty 2

## 2020-07-16 MED ORDER — FENTANYL CITRATE (PF) 100 MCG/2ML IJ SOLN: 25.0000 ug | INTRAMUSCULAR | Status: DC | PRN

## 2020-07-16 MED ORDER — PHENYLEPHRINE 40 MCG/ML (10ML) SYRINGE FOR IV PUSH (FOR BLOOD PRESSURE SUPPORT)
PREFILLED_SYRINGE | INTRAVENOUS | Status: AC
  Filled 2020-07-16: qty 10

## 2020-07-16 MED ORDER — CEFAZOLIN SODIUM-DEXTROSE 2-4 GM/100ML-% IV SOLN
2.0000 g | Freq: Three times a day (TID) | INTRAVENOUS | Status: DC
  Administered 2020-07-16 – 2020-07-19 (×9): 2 g via INTRAVENOUS
  Filled 2020-07-16 (×9): qty 100

## 2020-07-16 MED ORDER — LIDOCAINE 2% (20 MG/ML) 5 ML SYRINGE
INTRAMUSCULAR | Status: AC
  Filled 2020-07-16: qty 5

## 2020-07-16 MED ORDER — LIDOCAINE-EPINEPHRINE 1 %-1:100000 IJ SOLN
INTRAMUSCULAR | Status: DC | PRN
  Administered 2020-07-16: 20 mL

## 2020-07-16 MED ORDER — LACTATED RINGERS IV SOLN: INTRAVENOUS | Status: DC

## 2020-07-16 MED ORDER — LIDOCAINE HCL (CARDIAC) PF 100 MG/5ML IV SOSY
PREFILLED_SYRINGE | INTRAVENOUS | Status: DC | PRN
  Administered 2020-07-16: 60 mg via INTRAVENOUS

## 2020-07-16 MED ORDER — ALBUMIN HUMAN 5 % IV SOLN: INTRAVENOUS | Status: DC | PRN

## 2020-07-16 MED ORDER — PROPOFOL 10 MG/ML IV BOLUS
INTRAVENOUS | Status: DC | PRN
  Administered 2020-07-16: 90 mg via INTRAVENOUS

## 2020-07-16 MED ORDER — FENTANYL CITRATE (PF) 250 MCG/5ML IJ SOLN
INTRAMUSCULAR | Status: AC
  Filled 2020-07-16: qty 5

## 2020-07-16 MED ORDER — PHENYLEPHRINE 40 MCG/ML (10ML) SYRINGE FOR IV PUSH (FOR BLOOD PRESSURE SUPPORT)
PREFILLED_SYRINGE | INTRAVENOUS | Status: DC | PRN
  Administered 2020-07-16 (×2): 40 ug via INTRAVENOUS
  Administered 2020-07-16: 80 ug via INTRAVENOUS
  Administered 2020-07-16: 120 ug via INTRAVENOUS
  Administered 2020-07-16: 40 ug via INTRAVENOUS
  Administered 2020-07-16: 80 ug via INTRAVENOUS

## 2020-07-16 MED ORDER — EPHEDRINE 5 MG/ML INJ
INTRAVENOUS | Status: AC
  Filled 2020-07-16: qty 10

## 2020-07-16 MED ORDER — SODIUM CHLORIDE 0.9 % IR SOLN
Status: DC | PRN
  Administered 2020-07-16: 3000 mL

## 2020-07-16 SURGICAL SUPPLY — 55 items
APL PRP STRL LF DISP 70% ISPRP (MISCELLANEOUS)
BAG URO CATCHER STRL LF (MISCELLANEOUS) ×2 IMPLANT
BLADE SURG 15 STRL LF DISP TIS (BLADE) ×1 IMPLANT
BLADE SURG 15 STRL SS (BLADE) ×3
CANISTER SUCT 1200ML W/VALVE (MISCELLANEOUS) ×3 IMPLANT
CANISTER WOUND CARE 500ML ATS (WOUND CARE) IMPLANT
CHLORAPREP W/TINT 26 (MISCELLANEOUS) IMPLANT
COVER SURGICAL LIGHT HANDLE (MISCELLANEOUS) ×3 IMPLANT
COVER WAND RF STERILE (DRAPES) ×3 IMPLANT
DRAPE INCISE IOBAN 66X45 STRL (DRAPES) IMPLANT
DRAPE LAPAROTOMY T 98X78 PEDS (DRAPES) IMPLANT
DRSG EMULSION OIL 3X3 NADH (GAUZE/BANDAGES/DRESSINGS) IMPLANT
DRSG MEPITEL 4X7.2 (GAUZE/BANDAGES/DRESSINGS) ×2 IMPLANT
DRSG PAD ABDOMINAL 8X10 ST (GAUZE/BANDAGES/DRESSINGS) ×2 IMPLANT
DRSG TELFA 3X8 NADH (GAUZE/BANDAGES/DRESSINGS) IMPLANT
DRSG VAC ATS LRG SENSATRAC (GAUZE/BANDAGES/DRESSINGS) IMPLANT
DRSG VAC ATS MED SENSATRAC (GAUZE/BANDAGES/DRESSINGS) IMPLANT
ELECT CAUTERY BLADE 6.4 (BLADE) ×3 IMPLANT
ELECT REM PT RETURN 9FT ADLT (ELECTROSURGICAL) ×3
ELECTRODE REM PT RTRN 9FT ADLT (ELECTROSURGICAL) ×1 IMPLANT
GAUZE SPONGE 4X4 12PLY STRL (GAUZE/BANDAGES/DRESSINGS) ×3 IMPLANT
GLOVE BIO SURGEON STRL SZ 6.5 (GLOVE) ×1 IMPLANT
GLOVE BIO SURGEON STRL SZ7.5 (GLOVE) ×2 IMPLANT
GLOVE BIO SURGEONS STRL SZ 6.5 (GLOVE) ×1
GLOVE BIOGEL M STRL SZ7.5 (GLOVE) ×6 IMPLANT
GLOVE BIOGEL PI IND STRL 6.5 (GLOVE) IMPLANT
GLOVE BIOGEL PI IND STRL 7.0 (GLOVE) IMPLANT
GLOVE BIOGEL PI IND STRL 7.5 (GLOVE) IMPLANT
GLOVE BIOGEL PI IND STRL 8 (GLOVE) ×1 IMPLANT
GLOVE BIOGEL PI INDICATOR 6.5 (GLOVE) ×2
GLOVE BIOGEL PI INDICATOR 7.0 (GLOVE) ×2
GLOVE BIOGEL PI INDICATOR 7.5 (GLOVE) ×2
GLOVE BIOGEL PI INDICATOR 8 (GLOVE) ×2
GLOVE ECLIPSE 7.0 STRL STRAW (GLOVE) ×2 IMPLANT
GOWN STRL REUS W/ TWL LRG LVL3 (GOWN DISPOSABLE) ×2 IMPLANT
GOWN STRL REUS W/TWL LRG LVL3 (GOWN DISPOSABLE) ×6
HANDPIECE INTERPULSE COAX TIP (DISPOSABLE) ×3
KIT BASIN OR (CUSTOM PROCEDURE TRAY) ×3 IMPLANT
KIT TURNOVER KIT A (KITS) ×3 IMPLANT
LEGGING LITHOTOMY PAIR STRL (DRAPES) ×2 IMPLANT
NDL HYPO 25GX1X1/2 BEV (NEEDLE) IMPLANT
NEEDLE HYPO 25GX1X1/2 BEV (NEEDLE) ×3 IMPLANT
NS IRRIG 1000ML POUR BTL (IV SOLUTION) ×3 IMPLANT
PACK GENERAL/GYN (CUSTOM PROCEDURE TRAY) ×3 IMPLANT
PAD DRESSING TELFA 3X8 NADH (GAUZE/BANDAGES/DRESSINGS) IMPLANT
PRIMATRIX AG 8X8 (Miscellaneous) ×2 IMPLANT
SET HNDPC FAN SPRY TIP SCT (DISPOSABLE) IMPLANT
SOLUTION BETADINE 4OZ (MISCELLANEOUS) ×3 IMPLANT
SUT PDS AB 3-0 SH 27 (SUTURE) ×4 IMPLANT
SUT VIC AB 3-0 SH 27 (SUTURE) ×15
SUT VIC AB 3-0 SH 27X BRD (SUTURE) IMPLANT
SWAB COLLECTION DEVICE MRSA (MISCELLANEOUS) IMPLANT
SYR CONTROL 10ML LL (SYRINGE) ×5 IMPLANT
TOWEL GREEN STERILE (TOWEL DISPOSABLE) ×3 IMPLANT
TOWEL GREEN STERILE FF (TOWEL DISPOSABLE) ×3 IMPLANT

## 2020-07-16 NOTE — Anesthesia Preprocedure Evaluation (Addendum)
Anesthesia Evaluation  Patient identified by MRN, date of birth, ID band  Reviewed: Allergy & Precautions, NPO status , Patient's Chart, lab work & pertinent test results  Airway Mallampati: I  TM Distance: >3 FB Neck ROM: Full    Dental  (+) Poor Dentition, Missing, Loose,    Pulmonary    Pulmonary exam normal        Cardiovascular hypertension, Pt. on home beta blockers +CHF   Rhythm:Regular Rate:Normal     Neuro/Psych PSYCHIATRIC DISORDERS Anxiety Dementia negative neurological ROS     GI/Hepatic Neg liver ROS, GERD  ,  Endo/Other  Hypothyroidism   Renal/GU Renal disease  negative genitourinary   Musculoskeletal   Abdominal Normal abdominal exam  (+)   Peds  Hematology negative hematology ROS (+)   Anesthesia Other Findings   Reproductive/Obstetrics                            Anesthesia Physical Anesthesia Plan  ASA: III  Anesthesia Plan: General   Post-op Pain Management:    Induction: Intravenous  PONV Risk Score and Plan: 3 and Ondansetron and Treatment may vary due to age or medical condition  Airway Management Planned: LMA  Additional Equipment: None  Intra-op Plan:   Post-operative Plan: Extubation in OR  Informed Consent: I have reviewed the patients History and Physical, chart, labs and discussed the procedure including the risks, benefits and alternatives for the proposed anesthesia with the patient or authorized representative who has indicated his/her understanding and acceptance.       Plan Discussed with: CRNA  Anesthesia Plan Comments:        Anesthesia Quick Evaluation

## 2020-07-16 NOTE — Anesthesia Postprocedure Evaluation (Signed)
Anesthesia Post Note  Patient: Wayne [name]  Procedure(s) Performed: debridement of scrotal wound (N/A Scrotum) placement of primatrix ag mesh (N/A Scrotum)     Patient location during evaluation: PACU Anesthesia Type: General Level of consciousness: awake and alert Pain management: pain level controlled Vital Signs Assessment: post-procedure vital signs reviewed and stable Respiratory status: spontaneous breathing, nonlabored ventilation, respiratory function stable and patient connected to nasal cannula oxygen Cardiovascular status: blood pressure returned to baseline and stable Postop Assessment: no apparent nausea or vomiting Anesthetic complications: no   No complications documented.  Last Vitals:  Vitals:   07/16/20 1615 07/16/20 1633  BP: (!) 115/52 114/60  Pulse: 71 69  Resp: 16 14  Temp: (!) 36.3 C (!) 36.3 C  SpO2: 100% 96%    Last Pain:  Vitals:   07/16/20 1633  TempSrc: Oral  PainSc:                  Effie Berkshire

## 2020-07-16 NOTE — Op Note (Signed)
Operative Note   DATE OF OPERATION: 07/16/2020  SURGICAL DEPARTMENT: Plastic Surgery  PREOPERATIVE DIAGNOSES: Scrotal wound  POSTOPERATIVE DIAGNOSES:  same  PROCEDURE: 1.  Debridement of scrotal wound including skin and subcutaneous tissue totaling 15 x 15 cm 2.  Partial complex closure of scrotal wound totaling 10 cm 3.  Application of prime matrix AG mesh to scrotal wound totaling 8 x 8 cm  SURGEON: Talmadge Coventry, MD  ASSISTANT: Phoebe Sharps, PA The advanced practice practitioner (APP) assisted throughout the case.  The APP was essential in retraction and counter traction when needed to make the case progress smoothly.  This retraction and assistance made it possible to see the tissue plans for the procedure.  The assistance was needed for blood control, tissue re-approximation and assisted with closure of the incision site.  ANESTHESIA:  General.   COMPLICATIONS: None.   INDICATIONS FOR PROCEDURE:  The patient, Wayne [name] is a 86 y.o. male born on 1925/11/29, is here for treatment of complex closure wound. MRN: [id number]  CONSENT:  Informed consent was obtained directly from the patient. Risks, benefits and alternatives were fully discussed. Specific risks including but not limited to bleeding, infection, hematoma, seroma, scarring, pain, contracture, asymmetry, wound healing problems, and need for further surgery were all discussed. The patient did have an ample opportunity to have questions answered to satisfaction.   DESCRIPTION OF PROCEDURE:  The patient was taken to the operating room. SCDs were placed and antibiotics were given.  General anesthesia was administered.  The patient's operative site was prepped and draped in a sterile fashion. A time out was performed and all information was confirmed to be correct.  Started by evaluating the wound.  There is a few areas of fibrinous exudate and nonviable tissue that I debrided with pickups and scissors for an excisional  debridement.  This covered an area of about 15 x 15 cm.  Meticulous hemostasis was then obtained.  No purulence was encountered.  Pulse lavage was then used to irrigate this wound with around 2 L of saline solution.  At this point I was able to undermined nearly circumferentially and advance for a partial closure of the wound which was performed in layers with a combination of 3-0 Vicryl and 3-0 PDS sutures.  This made the wound approximately 8 x 8 cm in size.  At this point an 8 x 8 cm piece of pry matrix AG mesh was brought onto the field and soaked in saline.  It was then placed as a type of underlay to add additional coverage and protection of the testicles and this was secured with 3-0 Vicryl sutures..  A piece of Mepitel was then sewn over this with 3-0 Vicryl sutures.  Dressing was then applied with K-Y jelly, 4 x 4's, ABD and mesh underwear.  The patient tolerated the procedure well.  There were no complications. The patient was allowed to wake from anesthesia, extubated and taken to the recovery room in satisfactory condition.

## 2020-07-16 NOTE — Interval H&P Note (Signed)
Patient seen and examined. Risks and benefits discussed. Proceed with surgery today.

## 2020-07-16 NOTE — Progress Notes (Signed)
Wayne [name] is a 86 year old male who underwent debridement of scrotal wound with application of Primatrix AG Mesh today with Dr. Claudia Desanctis. Patient tolerated procedure well.   Do daily dressing changes consisting of KY jelly applied to mepitel mesh. Cover with gauze, ABD, and secure with mesh underwear.

## 2020-07-16 NOTE — Anesthesia Procedure Notes (Addendum)
Procedure Name: LMA Insertion Date/Time: 07/16/2020 1:19 PM Performed by: Jenne Campus, CRNA Pre-anesthesia Checklist: Patient identified, Emergency Drugs available, Suction available and Patient being monitored Patient Re-evaluated:Patient Re-evaluated prior to induction Oxygen Delivery Method: Circle System Utilized Preoxygenation: Pre-oxygenation with 100% oxygen Induction Type: IV induction Ventilation: Mask ventilation without difficulty LMA: LMA inserted LMA Size: 4.0 Number of attempts: 1 Placement Confirmation: positive ETCO2 and breath sounds checked- equal and bilateral Tube secured with: Tape Dental Injury: Teeth and Oropharynx as per pre-operative assessment

## 2020-07-16 NOTE — Progress Notes (Signed)
Pharmacy Antibiotic Note  Wayne [name] is a 86 y.o. male admitted on 07/08/2020 with cellulitis.  Pharmacy has been consulted for cefepime.  Patient remains afebrile with WBC wnl at 8.2. Since admission, patient's renal function has improved with Scr improved to what appears to be his baseline at 1.24, eCrCl ~39 ml/min. Blood cultures remain negative to date. Abscess cultures are showing rare group B strep and pan-sensitive proteus.  D/W Dr. Broadus John. Will deescalate to cefazolin to cover GBS and pan-S proteus.   Plan:  Change to Cefazolin 2gm IV q8h Monitor renal function and UOP     Height: 5' 7.5" (171.5 cm) Weight: 63.3 kg (139 lb 8.8 oz) IBW/kg (Calculated) : 67.25  Temp (24hrs), Avg:97.6 F (36.4 C), Min:97.3 F (36.3 C), Max:97.9 F (36.6 C)  Recent Labs  Lab 07/10/20 1205 07/11/20 0505 07/12/20 1219 07/13/20 0452 07/14/20 0237 07/14/20 0249 07/15/20 0504 07/16/20 0450  WBC  --    < > 8.1 8.2 9.5  --  7.0 8.2  CREATININE  --    < > 1.22 1.19  --  1.24 1.15 1.09  VANCORANDOM 6  --   --   --   --   --   --   --    < > = values in this interval not displayed.     Antimicrobials this admission: Cefepime 9/27 >>10/5 Cefazolin 10/5>> Vancomycin 9/27>> 10/3 Flagyl 9/27>>10/1  Microbiology results: 9/28 Abscess - pan sensitive proteus mirabilis and rare GBS 9/27 BCx x2 - ngtd  9/27 COVID/Flu - negative   Dare Sanger A. Levada Dy, PharmD, BCPS, FNKF Clinical Pharmacist [place] Please utilize Amion for appropriate phone number to reach the unit pharmacist ([place])   07/16/2020 9:04 AM

## 2020-07-16 NOTE — Brief Op Note (Signed)
07/16/2020  4:39 PM  PATIENT:  Wayne [name]  86 y.o. male  PRE-OPERATIVE DIAGNOSIS:  scrotal infection  POST-OPERATIVE DIAGNOSIS:  Scarotal Infection.  PROCEDURE:  Procedure(s): debridement of scrotal wound (N/A) placement of primatrix ag mesh (N/A)  SURGEON:  Surgeon(s) and Role:    * Niaomi Cartaya, Steffanie Dunn, MD - Primary  PHYSICIAN ASSISTANT: Phoebe Sharps, PA  ASSISTANTS: none   ANESTHESIA:   general  EBL:  10 mL   BLOOD ADMINISTERED:none  DRAINS: none   LOCAL MEDICATIONS USED:  LIDOCAINE   SPECIMEN:  No Specimen  DISPOSITION OF SPECIMEN:  N/A  COUNTS:  YES  TOURNIQUET:  * No tourniquets in log *  DICTATION: .Dragon Dictation  PLAN OF CARE: Admit  PATIENT DISPOSITION:  PACU - hemodynamically stable.   Delay start of Pharmacological VTE agent (>24hrs) due to surgical blood loss or risk of bleeding: not applicable

## 2020-07-16 NOTE — Progress Notes (Signed)
PROGRESS NOTE    Wayne [name]  PPJ:[id number] DOB: 31-Aug-1926 DOA: 07/08/2020 PCP: Isaac Bliss, Rayford Halsted, MD  Brief Narrative: Frail elderly 89/M from [facility], SNF w/ cognitive deficits, chronic diastolic CHF, severe protein calorie malnutrition, failure to thrive was admitted to the ICU on 9/27 with septic shock secondary to perineal necrotizing fasciitis. -Underwent extensive debridement in the OR by urology and general surgery, remained hypotensive in PACU, admitted to the ICU on vasopressors -Blood pressure improved, transferred to [facility] service  9/30  Assessment & Plan:   Septic shock  Necrotizing fasciitis of scrotum/perineum -Treated with broad-spectrum antibiotics, pressors, IV fluids and albumin in ICU -Sepsis physiology has resolved -Blood cultures are negative, Intra-Op cultures with rare group B strep, rare Proteus, vancomycin and Flagyl discontinued after 7 days, discontinued cefepime after 9 days, now continue IV Ancef -Urology input appreciated -Plastic surgery following, plan for wound matrix mesh today -Palliative care also following, palliative care follow-up at discharge to SNF with plan for hospice if he declines further  Severe protein calorie malnutrition Adult failure to thrive Severe debility Third spacing and edema -Overall prognosis is poor -Oral intake overall poor with some good days -Palliative care following -Plan for palliative care at SNF  Dysphagia -Acute on chronic -Discussed with SLP, will remain high aspiration risk, now on dysphagia 2 diet  -Oral intake marginally better -Discussed poor prognosis with patient's son and conservative management recommended  Acute kidney injury -In the setting of sepsis and shock, improving -Fluids were changed to D5 with potassium in the setting of hyponatremia -IV fluids discontinued, sodium improved, continue to encourage p.o. intake -Has third spacing and edema in both feet however due to poor oral  intake and recurrent hypernatremia avoided diuresis  Bilateral hydronephrosis Chronic bladder outlet obstruction Now urinating without issues, creatinine improving,  -Creatinine down to 1.2 now -Continue Foley catheter at Discharge in the setting of extensive perineal wound  Indeterminate right lower lobe level lesion -Unclear etiology, not appropriate for additional work-up at this time  Chronic anemia -Monitor, not appropriate for extensive work-up  Lower extremity edema -Due to third spacing, from fluid resuscitation in the setting of shock and sepsis -Diuretics deferred due to poor oral intake and hypernatremia  DVT prophylaxis: Heparin subcutaneous Code Status: DNR Family Communication: No family at bedside, discussed with son [name] at bedside 10/1 Disposition Plan:  Status is: Inpatient  Remains inpatient appropriate because:Inpatient level of care appropriate due to severity of illness   Dispo: The patient is from: SNF              Anticipated d/c is to: SNF versus hospice, extensive discussion with son 9/30              Anticipated d/c date is: > 3 days              Patient currently is not medically stable to d/c.  Consultants:   Urology, general surgery, PCCM  Plastic surgery   Procedures: Exploration, washout and debridement of necrotic scrotum for necrotizing fasciitis on 9/28  Antimicrobials:    Subjective: -Feels okay, no events overnight, oral intake is marginally better, denies any dyspnea  Objective: Vitals:   07/15/20 1621 07/15/20 2034 07/16/20 0449 07/16/20 0843  BP: 124/77 118/64 (!) 147/66 117/60  Pulse: 70 65 64 60  Resp: 16 17 17 18   Temp: 97.9 F (36.6 C) (!) 97.3 F (36.3 C) 97.6 F (36.4 C) 97.8 F (36.6 C)  TempSrc: Oral Oral Oral Axillary  SpO2: 98% 100% 100% 100%  Weight:      Height:        Intake/Output Summary (Last 24 hours) at 07/16/2020 1416 Last data filed at 07/16/2020 1350 Gross per 24 hour  Intake 547.45 ml   Output 1025 ml  Net -477.55 ml   Filed Weights   07/11/20 0304 07/11/20 2058 07/13/20 2007  Weight: 87.6 kg 58.7 kg 63.3 kg    Examination:  General exam: Elderly frail male laying in bed, awake alert oriented to self and place only, no distress CVS: S1-S2, regular rate rhythm Lungs: Decreased breath sounds at the bases Abdomen: Soft, slightly distended, nontender, extensive scrotal dressing noted Extremities: 2+ edema in both feet  R>L Skin: As above Psychiatry: Poor insight and judgment    Data Reviewed:   CBC: Recent Labs  Lab 07/10/20 0039 07/10/20 0039 07/11/20 0505 07/11/20 0505 07/12/20 1219 07/13/20 0452 07/14/20 0237 07/15/20 0504 07/16/20 0450  WBC 6.1   < > 8.6   < > 8.1 8.2 9.5 7.0 8.2  NEUTROABS 4.8  --  6.0  --  5.8 5.4 6.1  --   --   HGB 7.3*   < > 7.6*   < > 7.9* 8.5* 8.6* 8.1* 8.0*  HCT 23.0*   < > 24.1*   < > 24.5* 26.3* 26.7* 26.0* 25.8*  MCV 89.5   < > 89.9   < > 89.4 90.1 89.6 89.3 91.2  PLT 208   < > 268   < > 264 245 228 171 155   < > = values in this interval not displayed.   Basic Metabolic Panel: Recent Labs  Lab 07/10/20 0227 07/11/20 0505 07/12/20 1219 07/13/20 0452 07/14/20 0249 07/15/20 0504 07/16/20 0450  NA 144   < > 150* 147* 142 142 141  K 3.7   < > 3.2* 2.9* 4.1 3.7 4.0  CL 119*   < > 123* 121* 117* 119* 117*  CO2 17*   < > 18* 17* 17* 18* 16*  GLUCOSE 79   < > 105* 99 112* 90 87  BUN 66*   < > 32* 24* 27* 26* 26*  CREATININE 1.66*   < > 1.22 1.19 1.24 1.15 1.09  CALCIUM 8.4*   < > 8.6* 8.5* 8.3* 8.2* 8.2*  MG 2.0  --   --   --   --   --   --    < > = values in this interval not displayed.   GFR: Estimated Creatinine Clearance: 39.5 mL/min (by C-G formula based on SCr of 1.09 mg/dL). Liver Function Tests: Recent Labs  Lab 07/10/20 0227 07/11/20 0505  AST 14* 12*  ALT 18 17  ALKPHOS 92 89  BILITOT 1.0 1.3*  PROT 5.3* 5.5*  ALBUMIN 2.3* 2.5*   No results for input(s): LIPASE, AMYLASE in the last 168  hours. No results for input(s): AMMONIA in the last 168 hours. Coagulation Profile: No results for input(s): INR, PROTIME in the last 168 hours. Cardiac Enzymes: No results for input(s): CKTOTAL, CKMB, CKMBINDEX, TROPONINI in the last 168 hours. BNP (last 3 results) No results for input(s): PROBNP in the last 8760 hours. HbA1C: No results for input(s): HGBA1C in the last 72 hours. CBG: Recent Labs  Lab 07/15/20 2017 07/15/20 2346 07/16/20 0445 07/16/20 0737 07/16/20 1150  GLUCAP 87 89 76 76 79   Lipid Profile: No results for input(s): CHOL, HDL, LDLCALC, TRIG, CHOLHDL, LDLDIRECT in the last 72 hours. Thyroid Function Tests:  No results for input(s): TSH, T4TOTAL, FREET4, T3FREE, THYROIDAB in the last 72 hours. Anemia Panel: No results for input(s): VITAMINB12, FOLATE, FERRITIN, TIBC, IRON, RETICCTPCT in the last 72 hours. Urine analysis:    Component Value Date/Time   COLORURINE BROWN (A) 07/08/2020 2232   APPEARANCEUR TURBID (A) 07/08/2020 2232   LABSPEC 1.011 07/08/2020 2232   PHURINE 6.0 07/08/2020 2232   GLUCOSEU NEGATIVE 07/08/2020 2232   HGBUR LARGE (A) 07/08/2020 2232   BILIRUBINUR NEGATIVE 07/08/2020 2232   BILIRUBINUR n 11/11/2015 1716   KETONESUR NEGATIVE 07/08/2020 2232   PROTEINUR 100 (A) 07/08/2020 2232   UROBILINOGEN 0.2 11/11/2015 1716   UROBILINOGEN 0.2 02/15/2012 1335   NITRITE NEGATIVE 07/08/2020 2232   LEUKOCYTESUR MODERATE (A) 07/08/2020 2232   Sepsis Labs: @LABRCNTIP (procalcitonin:4,lacticidven:4)  ) Recent Results (from the past 240 hour(s))  Culture, blood (Routine x 2)     Status: None   Collection Time: 07/08/20  3:33 PM   Specimen: BLOOD  Result Value Ref Range Status   Specimen Description BLOOD RIGHT ANTECUBITAL  Final   Special Requests   Final    BOTTLES DRAWN AEROBIC AND ANAEROBIC Blood Culture adequate volume   Culture   Final    NO GROWTH 5 DAYS Performed at [facility], [place] [address], [place], [place] [address]     Report Status 07/13/2020 FINAL  Final  Culture, blood (Routine x 2)     Status: None   Collection Time: 07/08/20  7:39 PM   Specimen: BLOOD  Result Value Ref Range Status   Specimen Description BLOOD LEFT ANTECUBITAL  Final   Special Requests   Final    BOTTLES DRAWN AEROBIC AND ANAEROBIC Blood Culture adequate volume   Culture   Final    NO GROWTH 5 DAYS Performed at [facility], [place] [address], [place],  [address]    Report Status 07/13/2020 FINAL  Final  Respiratory Panel by RT PCR (Flu A&B, Covid) - Nasopharyngeal Swab     Status: None   Collection Time: 07/08/20 11:30 PM   Specimen: Nasopharyngeal Swab  Result Value Ref Range Status   SARS Coronavirus 2 by RT PCR NEGATIVE NEGATIVE Final    Comment: (NOTE) SARS-CoV-2 target nucleic acids are NOT DETECTED.  The SARS-CoV-2 RNA is generally detectable in upper respiratoy specimens during the acute phase of infection. The lowest concentration of SARS-CoV-2 viral copies this assay can detect is 131 copies/mL. A negative result does not preclude SARS-Cov-2 infection and should not be used as the sole basis for treatment or other patient management decisions. A negative result may occur with  improper specimen collection/handling, submission of specimen other than nasopharyngeal swab, presence of viral mutation(s) within the areas targeted by this assay, and inadequate number of viral copies (<131 copies/mL). A negative result must be combined with clinical observations, patient history, and epidemiological information. The expected result is Negative.  Fact Sheet for Patients:  PinkCheek.be  Fact Sheet for Healthcare Providers:  GravelBags.it  This test is no t yet approved or cleared by the [place] FDA and  has been authorized for detection and/or diagnosis of SARS-CoV-2 by FDA under an Emergency Use Authorization (EUA). This EUA will remain  in  effect (meaning this test can be used) for the duration of the COVID-19 declaration under Section 564(b)(1) of the Act, 21 U.S.C. section 360bbb-3(b)(1), unless the authorization is terminated or revoked sooner.     Influenza A by PCR NEGATIVE NEGATIVE Final   Influenza  B by PCR NEGATIVE NEGATIVE Final    Comment: (NOTE) The Xpert Xpress SARS-CoV-2/FLU/RSV assay is intended as an aid in  the diagnosis of influenza from Nasopharyngeal swab specimens and  should not be used as a sole basis for treatment. Nasal washings and  aspirates are unacceptable for Xpert Xpress SARS-CoV-2/FLU/RSV  testing.  Fact Sheet for Patients: PinkCheek.be  Fact Sheet for Healthcare Providers: GravelBags.it  This test is not yet approved or cleared by the [place] FDA and  has been authorized for detection and/or diagnosis of SARS-CoV-2 by  FDA under an Emergency Use Authorization (EUA). This EUA will remain  in effect (meaning this test can be used) for the duration of the  Covid-19 declaration under Section 564(b)(1) of the Act, 21  U.S.C. section 360bbb-3(b)(1), unless the authorization is  terminated or revoked. Performed at [facility], [place] [address], [place], [place] [address]   Aerobic/Anaerobic Culture (surgical/deep wound)     Status: None   Collection Time: 07/09/20  9:21 AM   Specimen: Abscess  Result Value Ref Range Status   Specimen Description ABSCESS  Final   Special Requests NONE  Final   Gram Stain   Final    RARE WBC PRESENT,BOTH PMN AND MONONUCLEAR RARE GRAM POSITIVE COCCI IN PAIRS IN CLUSTERS    Culture   Final    RARE GROUP B STREP(S.AGALACTIAE)ISOLATED TESTING AGAINST S. AGALACTIAE NOT ROUTINELY PERFORMED DUE TO PREDICTABILITY OF AMP/PEN/VAN SUSCEPTIBILITY. RARE PROTEUS MIRABILIS NO ANAEROBES ISOLATED Performed at [facility], [place] [address], [place], [place] [address]    Report Status 07/14/2020  FINAL  Final   Organism ID, Bacteria PROTEUS MIRABILIS  Final      Susceptibility   Proteus mirabilis - MIC*    AMPICILLIN <=2 SENSITIVE Sensitive     CEFAZOLIN <=4 SENSITIVE Sensitive     CEFEPIME <=0.12 SENSITIVE Sensitive     CEFTAZIDIME <=1 SENSITIVE Sensitive     CEFTRIAXONE <=0.25 SENSITIVE Sensitive     CIPROFLOXACIN <=0.25 SENSITIVE Sensitive     GENTAMICIN <=1 SENSITIVE Sensitive     IMIPENEM 1 SENSITIVE Sensitive     TRIMETH/SULFA <=20 SENSITIVE Sensitive     AMPICILLIN/SULBACTAM <=2 SENSITIVE Sensitive     PIP/TAZO <=4 SENSITIVE Sensitive     * RARE PROTEUS MIRABILIS  MRSA PCR Screening     Status: None   Collection Time: 07/10/20  3:08 PM   Specimen: Nasal Mucosa; Nasopharyngeal  Result Value Ref Range Status   MRSA by PCR NEGATIVE NEGATIVE Final    Comment:        The GeneXpert MRSA Assay (FDA approved for NASAL specimens only), is one component of a comprehensive MRSA colonization surveillance program. It is not intended to diagnose MRSA infection nor to guide or monitor treatment for MRSA infections. Performed at [facility], [place] [address] [place], [place] [address]     Scheduled Meds: . [MAR Hold] Chlorhexidine Gluconate Cloth  6 each Topical Daily  . [MAR Hold] docusate  100 mg Oral BID  . [MAR Hold] heparin injection (subcutaneous)  5,000 Units Subcutaneous Q8H  . [MAR Hold] levothyroxine  50 mcg Oral QAC breakfast  . [MAR Hold] liver oil-zinc oxide   Topical TID  . [MAR Hold] magic mouthwash  5 mL Oral BID  . [MAR Hold] mouth rinse  15 mL Mouth Rinse BID  . [MAR Hold] mirtazapine  15 mg Oral QHS  . [MAR Hold] multivitamin with minerals  1 tablet Oral  Daily  . [MAR Hold] polyethylene glycol  17 g Oral Daily  . [MAR Hold] sertraline  75 mg Oral Daily   Continuous Infusions: . [MAR Hold] sodium chloride Stopped (07/15/20 2358)  . [MAR Hold]  ceFAZolin (ANCEF) IV 2 g (07/16/20 1132)  . lactated ringers 10 mL/hr at 07/16/20 1304     LOS: 8  days    Time spent: 14min  Domenic Polite, MD [facility] 07/16/2020, 2:16 PM

## 2020-07-16 NOTE — Transfer of Care (Signed)
Immediate Anesthesia Transfer of Care Note  Patient: Wayne [name]  Procedure(s) Performed: debridement of scrotal wound (N/A Scrotum) placement of primatrix ag mesh (N/A Scrotum)  Patient Location: PACU  Anesthesia Type:General  Level of Consciousness: oriented, drowsy and patient cooperative  Airway & Oxygen Therapy: Patient Spontanous Breathing and Patient connected to face mask oxygen  Post-op Assessment: Report given to RN and Post -op Vital signs reviewed and stable  Post vital signs: Reviewed  Last Vitals:  Vitals Value Taken Time  BP 121/61 07/16/20 1502  Temp 36.2 C 07/16/20 1502  Pulse 71 07/16/20 1509  Resp 27 07/16/20 1509  SpO2 100 % 07/16/20 1509  Vitals shown include unvalidated device data.  Last Pain:  Vitals:   07/16/20 1502  TempSrc:   PainSc: 0-No pain      Patients Stated Pain Goal: 2 (08/07/24 3664)  Complications: No complications documented.

## 2020-07-17 ENCOUNTER — Encounter (HOSPITAL_COMMUNITY): Payer: Self-pay | Admitting: Plastic Surgery

## 2020-07-17 DIAGNOSIS — Z515 Encounter for palliative care: Secondary | ICD-10-CM | POA: Diagnosis not present

## 2020-07-17 DIAGNOSIS — N492 Inflammatory disorders of scrotum: Secondary | ICD-10-CM | POA: Diagnosis not present

## 2020-07-17 DIAGNOSIS — Z66 Do not resuscitate: Secondary | ICD-10-CM | POA: Diagnosis not present

## 2020-07-17 DIAGNOSIS — Z7189 Other specified counseling: Secondary | ICD-10-CM | POA: Diagnosis not present

## 2020-07-17 LAB — BASIC METABOLIC PANEL
Anion gap: 8 (ref 5–15)
BUN: 21 mg/dL (ref 8–23)
CO2: 18 mmol/L — ABNORMAL LOW (ref 22–32)
Calcium: 8 mg/dL — ABNORMAL LOW (ref 8.9–10.3)
Chloride: 115 mmol/L — ABNORMAL HIGH (ref 98–111)
Creatinine, Ser: 1.06 mg/dL (ref 0.61–1.24)
GFR calc non Af Amer: >60 mL/min (ref >60)
Glucose, Bld: 105 mg/dL — ABNORMAL HIGH (ref 70–99)
Potassium: 3.8 mmol/L (ref 3.5–5.1)
Sodium: 141 mmol/L (ref 135–145)

## 2020-07-17 LAB — CBC
HCT: 24.9 % — ABNORMAL LOW (ref 39.0–52.0)
Hemoglobin: 7.8 g/dL — ABNORMAL LOW (ref 13.0–17.0)
MCH: 29.1 pg (ref 26.0–34.0)
MCHC: 31.3 g/dL (ref 30.0–36.0)
MCV: 92.9 fL (ref 80.0–100.0)
Platelets: 153 10*3/uL (ref 150–400)
RBC: 2.68 MIL/uL — ABNORMAL LOW (ref 4.22–5.81)
RDW: 16.9 % — ABNORMAL HIGH (ref 11.5–15.5)
WBC: 9.9 10*3/uL (ref 4.0–10.5)
nRBC: 0 % (ref 0.0–0.2)

## 2020-07-17 LAB — GLUCOSE, CAPILLARY
Glucose-Capillary: 107 mg/dL — ABNORMAL HIGH (ref 70–99)
Glucose-Capillary: 110 mg/dL — ABNORMAL HIGH (ref 70–99)
Glucose-Capillary: 84 mg/dL (ref 70–99)
Glucose-Capillary: 86 mg/dL (ref 70–99)
Glucose-Capillary: 87 mg/dL (ref 70–99)
Glucose-Capillary: 87 mg/dL (ref 70–99)

## 2020-07-17 NOTE — Progress Notes (Signed)
PROGRESS NOTE    Wayne [name]  ZRA:[id number] DOB: 1925-12-18 DOA: 07/08/2020 PCP: Isaac Bliss, Rayford Halsted, MD   Brief Narrative: 86 year old from skilled nursing facility with cognitive deficit, chronic diastolic heart failure, severe protein caloric malnutrition, failure to thrive was admitted to the ICU on 9/27 with septic shock secondary to perineal necrotizing fasciitis. -Underwent extensive debridement in the OR by urology and general surgery, remained hypotensive in PACU, admitted to ICU on vasopressors. -Blood pressure improved, transferred to [facility] service 9/30.   Assessment & Plan:   Principal Problem:   Cellulitis of scrotum Active Problems:   Hypothyroidism   Hyperlipidemia   Essential hypertension   Dementia (HCC)   Stercoral colitis   Obstructive uropathy   Bilateral hydronephrosis   Sepsis due to undetermined organism ([place])   AKI (acute kidney injury) ([place])   Acute UTI (urinary tract infection)   Hyperkalemia   Pressure injury of skin   Goals of care, counseling/discussion   Palliative care by specialist   Scrotal infection   DNR (do not resuscitate)   DNI (do not intubate)   1-Septic shock, necrotizing fasciitis of his scrotum/perineum; -Treated with broad-spectrum antibiotics, IV pressors, IV fluids and albumin in the ICU -Sepsis physiology has resolved. -Blood cultures negative.  Intraoperative cultures with rare group B strep, rare Proteus, vancomycin and Flagyl discontinued after 7 days, discontinue cefepime after 9 days, now on IV Ancef. -Urology input appreciated. -Plastic surgery following, patient underwent wound matrix mesh on 10/05 -Palliative care also following, palliative to follow at a skilled nursing facility with plans for hospice if he declined further.  2-Severe protein caloric malnutrition, adult failure to thrive, severe debility, third spacing: -Prognosis is poor .  Encourage oral intake -Differential care follow-up at a skilled  nursing facility  Dysphagia: Acute on chronic: Continue with dysphagia two diet  Acute kidney injury: in thesetting of sepsis and shock improved. Has third spacing and edema, holding on IV fluids.  Bilateral hydronephrosis, chronic bladder outlet obstruction: Continue with Foley catheter at discharge in the setting of extensive perineal wound  Indeterminate right lower lobe level lesion: Follow-up as an outpatient.  Chronic anemia; monitor hb.  LE; due to third spacing, hypoalbuminemia.      Pressure Injury 10/11/18 Stage II -  Partial thickness loss of dermis presenting as a shallow open ulcer with a red, pink wound bed without slough. open areas to coccyx (Active)  10/11/18 1500  Location: Coccyx  Location Orientation:   Staging: Stage II -  Partial thickness loss of dermis presenting as a shallow open ulcer with a red, pink wound bed without slough.  Wound Description (Comments): open areas to coccyx  Present on Admission: Yes     Pressure Injury 07/09/20 Lumbar Right Stage 1 -  Intact skin with non-blanchable redness of a localized area usually over a bony prominence. (Active)  07/09/20 0815  Location: Lumbar  Location Orientation: Right  Staging: Stage 1 -  Intact skin with non-blanchable redness of a localized area usually over a bony prominence.  Wound Description (Comments):   Present on Admission: Yes     Nutrition Problem: Increased nutrient needs Etiology: wound healing    Signs/Symptoms: estimated needs    Interventions: Refer to RD note for recommendations  Estimated body mass index is 21.53 kg/m as calculated from the following:   Height as of this encounter: 5' 7.5" (1.715 m).   Weight as of this encounter: 63.3 kg.   DVT prophylaxis:  Code Status: DNR Family  Communication: Disposition Plan:  Status is: Inpatient  Remains inpatient appropriate because:IV treatments appropriate due to intensity of illness or inability to take PO   Dispo: The  patient is from: SNF              Anticipated d/c is to: SNF              Anticipated d/c date is: 1 day              Patient currently is not medically stable to d/c.  Awaiting clearance by plastic surgery        Consultants:   Urology, surgery, plastic surgery  Procedures:   Exploratory washout and debridement of necrotic scrotum for necrotizing fasciitis on 9/28  Debridement of the scrotal wound including the skin of subcutaneous tissues 15 x 15 cm, partial complex closure of the scrotal wound totaling 10 cm, application of prime Metrix Ag mesh to scrotal wound 8 x 10 cm on 10/5  Antimicrobials:    Subjective: Patient is alert, he reports pain has improved since yesterday.  Objective: Vitals:   07/16/20 2141 07/17/20 0600 07/17/20 0839 07/17/20 1615  BP: 111/60 129/64 106/69 (!) 102/56  Pulse: 68 69 70 86  Resp: 17 18 17 18   Temp: (!) 97.5 F (36.4 C) 97.8 F (36.6 C) 98 F (36.7 C) 97.9 F (36.6 C)  TempSrc: Oral Oral    SpO2: 97% 100% 100% 98%  Weight:      Height:        Intake/Output Summary (Last 24 hours) at 07/17/2020 1731 Last data filed at 07/17/2020 1559 Gross per 24 hour  Intake 1298.92 ml  Output 1100 ml  Net 198.92 ml   Filed Weights   07/11/20 0304 07/11/20 2058 07/13/20 2007  Weight: 87.6 kg 58.7 kg 63.3 kg    Examination:  General exam: Appears calm and comfortable  Respiratory system: Clear to auscultation. Respiratory effort normal. Cardiovascular system: S1 & S2 heard, RRR. No JVD, murmurs, rubs, gallops or clicks. No pedal edema. Gastrointestinal system: Abdomen is nondistended, soft and nontender. No organomegaly or masses felt. Normal bowel sounds heard. Central nervous system: Alert and oriented. Extremities: edema GU; dressing in place   Data Reviewed: I have personally reviewed following labs and imaging studies  CBC: Recent Labs  Lab 07/11/20 0505 07/11/20 0505 07/12/20 1219 07/12/20 1219 07/13/20 0452  07/14/20 0237 07/15/20 0504 07/16/20 0450 07/17/20 0048  WBC 8.6   < > 8.1   < > 8.2 9.5 7.0 8.2 9.9  NEUTROABS 6.0  --  5.8  --  5.4 6.1  --   --   --   HGB 7.6*   < > 7.9*   < > 8.5* 8.6* 8.1* 8.0* 7.8*  HCT 24.1*   < > 24.5*   < > 26.3* 26.7* 26.0* 25.8* 24.9*  MCV 89.9   < > 89.4   < > 90.1 89.6 89.3 91.2 92.9  PLT 268   < > 264   < > 245 228 171 155 153   < > = values in this interval not displayed.   Basic Metabolic Panel: Recent Labs  Lab 07/13/20 0452 07/14/20 0249 07/15/20 0504 07/16/20 0450 07/17/20 0048  NA 147* 142 142 141 141  K 2.9* 4.1 3.7 4.0 3.8  CL 121* 117* 119* 117* 115*  CO2 17* 17* 18* 16* 18*  GLUCOSE 99 112* 90 87 105*  BUN 24* 27* 26* 26* 21  CREATININE 1.19 1.24 1.15 1.09  1.06  CALCIUM 8.5* 8.3* 8.2* 8.2* 8.0*   GFR: Estimated Creatinine Clearance: 40.6 mL/min (by C-G formula based on SCr of 1.06 mg/dL). Liver Function Tests: Recent Labs  Lab 07/11/20 0505  AST 12*  ALT 17  ALKPHOS 89  BILITOT 1.3*  PROT 5.5*  ALBUMIN 2.5*   No results for input(s): LIPASE, AMYLASE in the last 168 hours. No results for input(s): AMMONIA in the last 168 hours. Coagulation Profile: No results for input(s): INR, PROTIME in the last 168 hours. Cardiac Enzymes: No results for input(s): CKTOTAL, CKMB, CKMBINDEX, TROPONINI in the last 168 hours. BNP (last 3 results) No results for input(s): PROBNP in the last 8760 hours. HbA1C: No results for input(s): HGBA1C in the last 72 hours. CBG: Recent Labs  Lab 07/17/20 0012 07/17/20 0404 07/17/20 0809 07/17/20 1204 07/17/20 1612  GLUCAP 107* 84 87 87 110*   Lipid Profile: No results for input(s): CHOL, HDL, LDLCALC, TRIG, CHOLHDL, LDLDIRECT in the last 72 hours. Thyroid Function Tests: No results for input(s): TSH, T4TOTAL, FREET4, T3FREE, THYROIDAB in the last 72 hours. Anemia Panel: No results for input(s): VITAMINB12, FOLATE, FERRITIN, TIBC, IRON, RETICCTPCT in the last 72 hours. Sepsis Labs: No  results for input(s): PROCALCITON, LATICACIDVEN in the last 168 hours.  Recent Results (from the past 240 hour(s))  Culture, blood (Routine x 2)     Status: None   Collection Time: 07/08/20  3:33 PM   Specimen: BLOOD  Result Value Ref Range Status   Specimen Description BLOOD RIGHT ANTECUBITAL  Final   Special Requests   Final    BOTTLES DRAWN AEROBIC AND ANAEROBIC Blood Culture adequate volume   Culture   Final    NO GROWTH 5 DAYS Performed at [facility], [address] [place], [place] [address]    Report Status 07/13/2020 FINAL  Final  Culture, blood (Routine x 2)     Status: None   Collection Time: 07/08/20  7:39 PM   Specimen: BLOOD  Result Value Ref Range Status   Specimen Description BLOOD LEFT ANTECUBITAL  Final   Special Requests   Final    BOTTLES DRAWN AEROBIC AND ANAEROBIC Blood Culture adequate volume   Culture   Final    NO GROWTH 5 DAYS Performed at [facility], [place] [address], [place], [place] [address]    Report Status 07/13/2020 FINAL  Final  Respiratory Panel by RT PCR (Flu A&B, Covid) - Nasopharyngeal Swab     Status: None   Collection Time: 07/08/20 11:30 PM   Specimen: Nasopharyngeal Swab  Result Value Ref Range Status   SARS Coronavirus 2 by RT PCR NEGATIVE NEGATIVE Final    Comment: (NOTE) SARS-CoV-2 target nucleic acids are NOT DETECTED.  The SARS-CoV-2 RNA is generally detectable in upper respiratoy specimens during the acute phase of infection. The lowest concentration of SARS-CoV-2 viral copies this assay can detect is 131 copies/mL. A negative result does not preclude SARS-Cov-2 infection and should not be used as the sole basis for treatment or other patient management decisions. A negative result may occur with  improper specimen collection/handling, submission of specimen other than nasopharyngeal swab, presence of viral mutation(s) within the areas targeted by this assay, and inadequate number of viral copies (<131  copies/mL). A negative result must be combined with clinical observations, patient history, and epidemiological information. The expected result is Negative.  Fact Sheet for Patients:  PinkCheek.be  Fact Sheet for Healthcare Providers:  GravelBags.it  This test is  no t yet approved or cleared by the [place] and  has been authorized for detection and/or diagnosis of SARS-CoV-2 by FDA under an Emergency Use Authorization (EUA). This EUA will remain  in effect (meaning this test can be used) for the duration of the COVID-19 declaration under Section 564(b)(1) of the Act, 21 U.S.C. section 360bbb-3(b)(1), unless the authorization is terminated or revoked sooner.     Influenza A by PCR NEGATIVE NEGATIVE Final   Influenza B by PCR NEGATIVE NEGATIVE Final    Comment: (NOTE) The Xpert Xpress SARS-CoV-2/FLU/RSV assay is intended as an aid in  the diagnosis of influenza from Nasopharyngeal swab specimens and  should not be used as a sole basis for treatment. Nasal washings and  aspirates are unacceptable for Xpert Xpress SARS-CoV-2/FLU/RSV  testing.  Fact Sheet for Patients: PinkCheek.be  Fact Sheet for Healthcare Providers: GravelBags.it  This test is not yet approved or cleared by the [place] FDA and  has been authorized for detection and/or diagnosis of SARS-CoV-2 by  FDA under an Emergency Use Authorization (EUA). This EUA will remain  in effect (meaning this test can be used) for the duration of the  Covid-19 declaration under Section 564(b)(1) of the Act, 21  U.S.C. section 360bbb-3(b)(1), unless the authorization is  terminated or revoked. Performed at [facility], [place] [address] [place], [place] [address]   Aerobic/Anaerobic Culture (surgical/deep wound)     Status: None   Collection Time: 07/09/20  9:21 AM   Specimen: Abscess  Result  Value Ref Range Status   Specimen Description ABSCESS  Final   Special Requests NONE  Final   Gram Stain   Final    RARE WBC PRESENT,BOTH PMN AND MONONUCLEAR RARE GRAM POSITIVE COCCI IN PAIRS IN CLUSTERS    Culture   Final    RARE GROUP B STREP(S.AGALACTIAE)ISOLATED TESTING AGAINST S. AGALACTIAE NOT ROUTINELY PERFORMED DUE TO PREDICTABILITY OF AMP/PEN/VAN SUSCEPTIBILITY. RARE PROTEUS MIRABILIS NO ANAEROBES ISOLATED Performed at [facility], [name] [address] [place], [place] [address]    Report Status 07/14/2020 FINAL  Final   Organism ID, Bacteria PROTEUS MIRABILIS  Final      Susceptibility   Proteus mirabilis - MIC*    AMPICILLIN <=2 SENSITIVE Sensitive     CEFAZOLIN <=4 SENSITIVE Sensitive     CEFEPIME <=0.12 SENSITIVE Sensitive     CEFTAZIDIME <=1 SENSITIVE Sensitive     CEFTRIAXONE <=0.25 SENSITIVE Sensitive     CIPROFLOXACIN <=0.25 SENSITIVE Sensitive     GENTAMICIN <=1 SENSITIVE Sensitive     IMIPENEM 1 SENSITIVE Sensitive     TRIMETH/SULFA <=20 SENSITIVE Sensitive     AMPICILLIN/SULBACTAM <=2 SENSITIVE Sensitive     PIP/TAZO <=4 SENSITIVE Sensitive     * RARE PROTEUS MIRABILIS  MRSA PCR Screening     Status: None   Collection Time: 07/10/20  3:08 PM   Specimen: Nasal Mucosa; Nasopharyngeal  Result Value Ref Range Status   MRSA by PCR NEGATIVE NEGATIVE Final    Comment:        The GeneXpert MRSA Assay (FDA approved for NASAL specimens only), is one component of a comprehensive MRSA colonization surveillance program. It is not intended to diagnose MRSA infection nor to guide or monitor treatment for MRSA infections. Performed at [facility], [place] [address], [place], [place] [address]          Radiology Studies: No results found.      Scheduled Meds: . Chlorhexidine  Gluconate Cloth  6 each Topical Daily  . docusate  100 mg Oral BID  . heparin injection (subcutaneous)  5,000 Units Subcutaneous Q8H  . levothyroxine  50 mcg Oral QAC  breakfast  . liver oil-zinc oxide   Topical TID  . magic mouthwash  5 mL Oral BID  . mouth rinse  15 mL Mouth Rinse BID  . mirtazapine  15 mg Oral QHS  . multivitamin with minerals  1 tablet Oral Daily  . polyethylene glycol  17 g Oral Daily  . sertraline  75 mg Oral Daily   Continuous Infusions: . sodium chloride 250 mL (07/17/20 0607)  .  ceFAZolin (ANCEF) IV 2 g (07/17/20 1503)     LOS: 9 days    Time spent: 35 minutes.     Elmarie Shiley, MD [facility]   If 7PM-7AM, please contact night-coverage [email or web address]  07/17/2020, 5:31 PM

## 2020-07-17 NOTE — Plan of Care (Signed)
  Problem: Pain Managment: Goal: General experience of comfort will improve Outcome: Progressing   

## 2020-07-17 NOTE — Progress Notes (Signed)
Palliative:   Wayne [name] is resting quietly in bed.  He greets me, making and keeping eye contact.  He is alert and oriented, able to make hs basic needs known. There is no family at bedside a this time.  I ask how he is doing after surgery and her tells me "good", he has no complaints.   Plan:  Continue to treat the treatable, but no CPR or intubation. Anticipate need for STR.    15 minutes  Quinn Axe, NP Palliative Medicine Team Team Phone [phone] Greater than 50% of this time was spent counseling and coordinating care related to the above assessment and plan.

## 2020-07-17 NOTE — Progress Notes (Addendum)
Nutrition Follow-up  DOCUMENTATION CODES:   Not applicable  INTERVENTION:  Continue Vital Cuisine shake po TID with meals, each supplement provides 500 kcal and 22 grams of protein  Continue MVI daily   NUTRITION DIAGNOSIS:   Increased nutrient needs related to wound healing as evidenced by estimated needs.  Ongoing  GOAL:   Patient will meet greater than or equal to 90% of their needs  Progressing  MONITOR:   Weight trends, Labs, Skin, I & O's  REASON FOR ASSESSMENT:   Malnutrition Screening Tool    ASSESSMENT:   Pt admitted with necrotizing fasciitis of scrotum and perineum. Pt also with UTI. PMH includes anxiety, dementia, skin cancer, CHF, BPH, GERD, HLD, HTN, hypothyroidism, osteoporosis, and scoliosis.  9/28 s/p exploration, washout and debridement of necrotic scrotum and perineum 10/5 s/p debridement of scrotal wound including skin and subcutaneous tissue, partial complex closure of scotal wound, application of prime matrix AG mesh to scrotal wound  Pt unavailable at time of RD visit.  Palliative care is to follow-up at discharge to SNF with plan for hospice if pt further declines.   Pt noted to be third spacing.   Pt with very poor po intake. Charted as 0-25%. Pt previously stated he would not want a feeding tube. Will continue with current nutrition plan of care.   UOP: 1459ml x24 hours I/O: +5,285.52ml since admit  Labs reviewed. Medications: colace, remeron, mvi, miralax  Diet Order:   Diet Order            DIET DYS 2 Room service appropriate? Yes; Fluid consistency: Nectar Thick  Diet effective now                 EDUCATION NEEDS:   No education needs have been identified at this time  Skin:  Skin Assessment: Skin Integrity Issues: Skin Integrity Issues:: Stage I, Incisions, Other (Comment) Stage I: R lumbar Incisions: perineum; groin Other: non-pressure wound R great toe  Last BM:  PTA  Height:   Ht Readings from Last 1  Encounters:  07/11/20 5' 7.5" (1.715 m)    Weight:   Wt Readings from Last 1 Encounters:  07/13/20 63.3 kg    BMI:  Body mass index is 21.53 kg/m.  Estimated Nutritional Needs:   Kcal:  2000-2200  Protein:  100-110 grams  Fluid:  >/=2L/d    Larkin Ina, MS, RD, LDN RD pager number and weekend/on-call pager number located in [place].

## 2020-07-17 NOTE — Progress Notes (Signed)
Wound care per plastic surgery. Please call if we can be of further assistance.   Wellington Hampshire, [facility] 07/17/2020, 9:24 AM Please see Amion for pager number during day hours 7:00am-4:30pm

## 2020-07-18 DIAGNOSIS — N492 Inflammatory disorders of scrotum: Secondary | ICD-10-CM | POA: Diagnosis not present

## 2020-07-18 LAB — CBC
HCT: 23.1 % — ABNORMAL LOW (ref 39.0–52.0)
Hemoglobin: 7.1 g/dL — ABNORMAL LOW (ref 13.0–17.0)
MCH: 28.5 pg (ref 26.0–34.0)
MCHC: 30.7 g/dL (ref 30.0–36.0)
MCV: 92.8 fL (ref 80.0–100.0)
Platelets: 141 10*3/uL — ABNORMAL LOW (ref 150–400)
RBC: 2.49 MIL/uL — ABNORMAL LOW (ref 4.22–5.81)
RDW: 16.7 % — ABNORMAL HIGH (ref 11.5–15.5)
WBC: 6.6 10*3/uL (ref 4.0–10.5)
nRBC: 0 % (ref 0.0–0.2)

## 2020-07-18 LAB — RETICULOCYTES
Immature Retic Fract: 16.5 % — ABNORMAL HIGH (ref 2.3–15.9)
RBC.: 3.02 MIL/uL — ABNORMAL LOW (ref 4.22–5.81)
Retic Count, Absolute: 99 10*3/uL (ref 19.0–186.0)
Retic Ct Pct: 2.5 % (ref 0.4–3.1)

## 2020-07-18 LAB — IRON AND TIBC
Iron: 46 ug/dL (ref 45–182)
Saturation Ratios: 35 % (ref 17.9–39.5)
TIBC: 130 ug/dL — ABNORMAL LOW (ref 250–450)
UIBC: 84 ug/dL

## 2020-07-18 LAB — GLUCOSE, CAPILLARY
Glucose-Capillary: 104 mg/dL — ABNORMAL HIGH (ref 70–99)
Glucose-Capillary: 70 mg/dL (ref 70–99)
Glucose-Capillary: 76 mg/dL (ref 70–99)
Glucose-Capillary: 81 mg/dL (ref 70–99)
Glucose-Capillary: 88 mg/dL (ref 70–99)
Glucose-Capillary: 91 mg/dL (ref 70–99)

## 2020-07-18 LAB — VITAMIN B12: Vitamin B-12: 770 pg/mL (ref 180–914)

## 2020-07-18 LAB — FOLATE: Folate: 10.1 ng/mL (ref >5.9)

## 2020-07-18 LAB — FERRITIN: Ferritin: 535 ng/mL — ABNORMAL HIGH (ref 24–336)

## 2020-07-18 MED ORDER — WHITE PETROLATUM EX OINT
TOPICAL_OINTMENT | CUTANEOUS | Status: AC
  Filled 2020-07-18: qty 28.35

## 2020-07-18 MED ORDER — FERROUS SULFATE 325 (65 FE) MG PO TABS
325.0000 mg | ORAL_TABLET | Freq: Every day | ORAL | Status: DC
  Administered 2020-07-19: 325 mg via ORAL
  Filled 2020-07-18: qty 1

## 2020-07-18 NOTE — Progress Notes (Signed)
Applied prevalon boots.

## 2020-07-18 NOTE — Care Management Important Message (Signed)
Important Message  Patient Details  Name: ANGLE KAREL MRN: [id number] Date of Birth: 1926/05/27   Medicare Important Message Given:  Yes - Important Message mailed due to current National Emergency  Verbal consent obtained due to current National Emergency  Relationship to patient: Child Contact Name: Orel Cooler Call Date: 07/18/20  Time: 1030 Phone: [phone] Outcome: No Answer/Busy Important Message mailed to: Patient address on file    [place] 07/18/2020, 10:30 AM

## 2020-07-18 NOTE — Evaluation (Signed)
Physical Therapy Evaluation Patient Details Name: Wayne [name] MRN: [id number] DOB: [birth year] Today's Date: 07/18/2020   History of Present Illness  86yo male admitted 9/27 for perineal necrotizing fasciitis. Received I&D of scrotum 9/28, then further debridement of scrotum 10/5. PMH scoliosis, HTN, HLD, dementia, CHF, CA, back pain, toe surgery, lumbar and cervical surgery, L hip IM Nail  Clinical Impression   Patient received in bed, very pleasantly and cooperative with therapy today. Requires heavy levels of physical assistance for all mobility today, and demonstrates poor sitting and standing balance due to heavy posterior lean today. Able to stand with totalA but unable to maintain and needed totalA for safe controlled lower back to bed. He then attempted to lay straight back and needed totalA to pivot around to correct position in the bed, but was able to assist me with scooting up higher in bed. Definite STM deficits- tells me he will be OK if only he can get his hands on his personal walker, also quite a bit of trouble with problem solving today. Will need +2 assist to progress. Left in bed with all needs met, bed alarm active. Recommend return to SNF once medically ready.     Follow Up Recommendations SNF;Supervision/Assistance - 24 hour    Equipment Recommendations  Other (comment) (defer to next venue)    Recommendations for Other Services       Precautions / Restrictions Precautions Precautions: Fall;Other (comment) Precaution Comments: scrotal wound- would limit scooting and length of time sitting without specialized cushion Restrictions Weight Bearing Restrictions: No      Mobility  Bed Mobility Overal bed mobility: Needs Assistance Bed Mobility: Supine to Sit;Sit to Supine     Supine to sit: Mod assist Sit to supine: Total assist   General bed mobility comments: ModA to get BLEs off side of bed and he was then able to bring trunk up to sitting but needed Min  guard-ModA to maintain upright; when returning to bed attempted to lay straight back and needed totalA to pivot into correct position in bed  Transfers Overall transfer level: Needs assistance Equipment used: Rolling walker (2 wheeled) Transfers: Sit to/from Stand Sit to Stand: Total assist;From elevated surface         General transfer comment: strong posterior lean and unable to maintain standing with one person due to  being so unsteady  Ambulation/Gait             General Gait Details: unable  Stairs            Wheelchair Mobility    Modified Rankin (Stroke Patients Only)       Balance Overall balance assessment: Needs assistance;History of Falls Sitting-balance support: Bilateral upper extremity supported;Feet supported Sitting balance-Leahy Scale: Poor Sitting balance - Comments: min guard to [place] fluctuating based on fatigue and attention Postural control: Posterior lean Standing balance support: Bilateral upper extremity supported;During functional activity Standing balance-Leahy Scale: Zero Standing balance comment: strong posterior lean and needed totalA to maintain upright                             Pertinent Vitals/Pain Pain Assessment: Faces Pain Score: 0-No pain Faces Pain Scale: No hurt Pain Intervention(s): Limited activity within patient's tolerance;Monitored during session;Repositioned    Home Living Family/patient expects to be discharged to:: Skilled nursing facility                 Additional Comments: per chart, has  been bed bound since fall over a year ago but he reports he walks about 63f with someone's help at [employer]    Prior Function           Comments: poor historian- uanble to get accurate or specific details     Hand Dominance        Extremity/Trunk Assessment   Upper Extremity Assessment Upper Extremity Assessment: Generalized weakness    Lower Extremity Assessment Lower Extremity  Assessment: Generalized weakness    Cervical / Trunk Assessment Cervical / Trunk Assessment: Kyphotic (and scoliotic)  Communication   Communication: No difficulties  Cognition Arousal/Alertness: Awake/alert Behavior During Therapy: Flat affect Overall Cognitive Status: History of cognitive impairments - at baseline                                 General Comments: dementia at baseline- poor historian and cannot tell me accurate PLOF; increased processing time and perseverates on telling me if he had his person walker he'd be OK      General Comments      Exercises     Assessment/Plan    PT Assessment Patient needs continued PT services  PT Problem List Decreased strength;Decreased cognition;Decreased knowledge of use of DME;Decreased activity tolerance;Decreased safety awareness;Decreased balance;Decreased mobility;Decreased coordination       PT Treatment Interventions      PT Goals (Current goals can be found in the Care Plan section)  Acute Rehab PT Goals Patient Stated Goal: get his own walker to be OK PT Goal Formulation: With patient Time For Goal Achievement: 08/01/20 Potential to Achieve Goals: Fair    Frequency Min 2X/week   Barriers to discharge        Co-evaluation               AM-PAC PT "6 Clicks" Mobility  Outcome Measure Help needed turning from your back to your side while in a flat bed without using bedrails?: A Lot Help needed moving from lying on your back to sitting on the side of a flat bed without using bedrails?: A Lot Help needed moving to and from a bed to a chair (including a wheelchair)?: Total Help needed standing up from a chair using your arms (e.g., wheelchair or bedside chair)?: Total Help needed to walk in hospital room?: Total Help needed climbing 3-5 steps with a railing? : Total 6 Click Score: 8    End of Session Equipment Utilized During Treatment: Gait belt Activity Tolerance: Patient tolerated  treatment well Patient left: in bed;with call bell/phone within reach;with bed alarm set Nurse Communication: Mobility status PT Visit Diagnosis: Unsteadiness on feet (R26.81);Muscle weakness (generalized) (M62.81);Other abnormalities of gait and mobility (R26.89);History of falling (Z91.81);Difficulty in walking, not elsewhere classified (R26.2)    Time: 19675-9163PT Time Calculation (min) (ACUTE ONLY): 27 min   Charges:   PT Evaluation $PT Eval Moderate Complexity: 1 Mod PT Treatments $Therapeutic Activity: 8-22 mins       KWindell [place] DPT, PN1   Supplemental Physical Therapist [place]   Pager [phone] Rehab Office [phone]

## 2020-07-18 NOTE — Progress Notes (Signed)
PROGRESS NOTE    Wayne [name]  GLO:[id number] DOB: 01-Sep-1926 DOA: 07/08/2020 PCP: Isaac Bliss, Rayford Halsted, MD   Brief Narrative: 86 year old from skilled nursing facility with cognitive deficit, chronic diastolic heart failure, severe protein caloric malnutrition, failure to thrive was admitted to the ICU on 9/27 with septic shock secondary to perineal necrotizing fasciitis. -Underwent extensive debridement in the OR by urology and general surgery, remained hypotensive in PACU, admitted to ICU on vasopressors. -Blood pressure improved, transferred to [facility] service 9/30. -Underwent wound matrix mesh on 10/05.   Assessment & Plan:   Principal Problem:   Cellulitis of scrotum Active Problems:   Hypothyroidism   Hyperlipidemia   Essential hypertension   Dementia (HCC)   Stercoral colitis   Obstructive uropathy   Bilateral hydronephrosis   Sepsis due to undetermined organism ([place])   AKI (acute kidney injury) ([place])   Acute UTI (urinary tract infection)   Hyperkalemia   Pressure injury of skin   Goals of care, counseling/discussion   Palliative care by specialist   Scrotal infection   DNR (do not resuscitate)   DNI (do not intubate)   1-Septic shock, necrotizing fasciitis of his scrotum/perineum; -Treated with broad-spectrum antibiotics, IV pressors, IV fluids and albumin in the ICU -Sepsis physiology has resolved. -Blood cultures negative.  Intraoperative cultures with rare group B strep, rare Proteus, vancomycin and Flagyl discontinued after 7 days, discontinue cefepime after 9 days, now on IV Ancef. -Urology input appreciated. -Plastic surgery following, patient underwent wound matrix mesh on 10/05 -Palliative care also following, palliative to follow at a skilled nursing facility with plans for hospice if he declined further. -Vitals stables.  -Awaiting plastic surgery recommendation for wound care at discharge and if patient is clear for discharge.   2-Severe protein  caloric malnutrition, adult failure to thrive, severe debility, third spacing: -Prognosis is poor -Continue to encourage oral intake.  -Palliative care follow-up at a skilled nursing facility.  Dysphagia: Acute on chronic: Continue with dysphagia 2 diet  Acute kidney injury: in thesetting of sepsis and shock improved. Has third spacing and edema, holding on IV fluids.  Bilateral hydronephrosis, chronic bladder outlet obstruction: Continue with Foley catheter at discharge in the setting of extensive perineal wound Needs to follow up with urology  Indeterminate right lower lobe level lesion: Follow-up as an outpatient.  Chronic anemia; monitor hb. Anemia panel consistent with anemia of chronic diseases.  LE; due to third spacing, hypoalbuminemia.      Pressure Injury 10/11/18 Stage II -  Partial thickness loss of dermis presenting as a shallow open ulcer with a red, pink wound bed without slough. open areas to coccyx (Active)  10/11/18 1500  Location: Coccyx  Location Orientation:   Staging: Stage II -  Partial thickness loss of dermis presenting as a shallow open ulcer with a red, pink wound bed without slough.  Wound Description (Comments): open areas to coccyx  Present on Admission: Yes     Pressure Injury 07/09/20 Lumbar Right Stage 1 -  Intact skin with non-blanchable redness of a localized area usually over a bony prominence. (Active)  07/09/20 0815  Location: Lumbar  Location Orientation: Right  Staging: Stage 1 -  Intact skin with non-blanchable redness of a localized area usually over a bony prominence.  Wound Description (Comments):   Present on Admission: Yes     Nutrition Problem: Increased nutrient needs Etiology: wound healing    Signs/Symptoms: estimated needs    Interventions: Refer to RD note for recommendations  Estimated body mass index is 21.53 kg/m as calculated from the following:   Height as of this encounter: 5' 7.5" (1.715 m).   Weight as of  this encounter: 63.3 kg.   DVT prophylaxis: Heparin  Code Status: DNR Family Communication: Disposition Plan:  Status is: Inpatient  Remains inpatient appropriate because:IV treatments appropriate due to intensity of illness or inability to take PO   Dispo: The patient is from: SNF              Anticipated d/c is to: SNF              Anticipated d/c date is: 1 day              Patient currently is not medically stable to d/c.  Awaiting clearance by plastic surgery        Consultants:   Urology, surgery, plastic surgery  Procedures:   Exploratory washout and debridement of necrotic scrotum for necrotizing fasciitis on 9/28  Debridement of the scrotal wound including the skin of subcutaneous tissues 15 x 15 cm, partial complex closure of the scrotal wound totaling 10 cm, application of prime Metrix Ag mesh to scrotal wound 8 x 10 cm on 10/5  Antimicrobials:    Subjective: He is alert, denies pelvis pain.  He has been drinking ensure.   Objective: Vitals:   07/17/20 1615 07/17/20 2130 07/18/20 0418 07/18/20 0859  BP: (!) 102/56 (!) 106/52 (!) 116/56 100/62  Pulse: 86 77 64 78  Resp: 18 18 17 17   Temp: 97.9 F (36.6 C) (!) 97.5 F (36.4 C) 97.8 F (36.6 C) 97.9 F (36.6 C)  TempSrc:  Oral Oral   SpO2: 98% 99% 100% 100%  Weight:      Height:        Intake/Output Summary (Last 24 hours) at 07/18/2020 1210 Last data filed at 07/18/2020 0935 Gross per 24 hour  Intake 580 ml  Output 900 ml  Net -320 ml   Filed Weights   07/11/20 0304 07/11/20 2058 07/13/20 2007  Weight: 87.6 kg 58.7 kg 63.3 kg    Examination:  General exam: NAD Respiratory system:  CTA Cardiovascular system: S 1, S 2 RRR. Gastrointestinal system: BS present, soft, nt Central nervous system: alert Extremities:  Trace edema GU; foley catheter, dressing in place.    Data Reviewed: I have personally reviewed following labs and imaging studies  CBC: Recent Labs  Lab 07/12/20 1219  07/12/20 1219 07/13/20 0452 07/13/20 0452 07/14/20 0237 07/15/20 0504 07/16/20 0450 07/17/20 0048 07/18/20 0149  WBC 8.1   < > 8.2   < > 9.5 7.0 8.2 9.9 6.6  NEUTROABS 5.8  --  5.4  --  6.1  --   --   --   --   HGB 7.9*   < > 8.5*   < > 8.6* 8.1* 8.0* 7.8* 7.1*  HCT 24.5*   < > 26.3*   < > 26.7* 26.0* 25.8* 24.9* 23.1*  MCV 89.4   < > 90.1   < > 89.6 89.3 91.2 92.9 92.8  PLT 264   < > 245   < > 228 171 155 153 141*   < > = values in this interval not displayed.   Basic Metabolic Panel: Recent Labs  Lab 07/13/20 0452 07/14/20 0249 07/15/20 0504 07/16/20 0450 07/17/20 0048  NA 147* 142 142 141 141  K 2.9* 4.1 3.7 4.0 3.8  CL 121* 117* 119* 117* 115*  CO2 17* 17*  18* 16* 18*  GLUCOSE 99 112* 90 87 105*  BUN 24* 27* 26* 26* 21  CREATININE 1.19 1.24 1.15 1.09 1.06  CALCIUM 8.5* 8.3* 8.2* 8.2* 8.0*   GFR: Estimated Creatinine Clearance: 40.6 mL/min (by C-G formula based on SCr of 1.06 mg/dL). Liver Function Tests: No results for input(s): AST, ALT, ALKPHOS, BILITOT, PROT, ALBUMIN in the last 168 hours. No results for input(s): LIPASE, AMYLASE in the last 168 hours. No results for input(s): AMMONIA in the last 168 hours. Coagulation Profile: No results for input(s): INR, PROTIME in the last 168 hours. Cardiac Enzymes: No results for input(s): CKTOTAL, CKMB, CKMBINDEX, TROPONINI in the last 168 hours. BNP (last 3 results) No results for input(s): PROBNP in the last 8760 hours. HbA1C: No results for input(s): HGBA1C in the last 72 hours. CBG: Recent Labs  Lab 07/17/20 2127 07/18/20 0012 07/18/20 0415 07/18/20 0751 07/18/20 1129  GLUCAP 86 81 70 76 91   Lipid Profile: No results for input(s): CHOL, HDL, LDLCALC, TRIG, CHOLHDL, LDLDIRECT in the last 72 hours. Thyroid Function Tests: No results for input(s): TSH, T4TOTAL, FREET4, T3FREE, THYROIDAB in the last 72 hours. Anemia Panel: Recent Labs    07/18/20 0826  VITAMINB12 770  FOLATE 10.1  FERRITIN 535*  TIBC  130*  IRON 46  RETICCTPCT 2.5   Sepsis Labs: No results for input(s): PROCALCITON, LATICACIDVEN in the last 168 hours.  Recent Results (from the past 240 hour(s))  Culture, blood (Routine x 2)     Status: None   Collection Time: 07/08/20  3:33 PM   Specimen: BLOOD  Result Value Ref Range Status   Specimen Description BLOOD RIGHT ANTECUBITAL  Final   Special Requests   Final    BOTTLES DRAWN AEROBIC AND ANAEROBIC Blood Culture adequate volume   Culture   Final    NO GROWTH 5 DAYS Performed at [facility], [address] [place], [place] [address]    Report Status 07/13/2020 FINAL  Final  Culture, blood (Routine x 2)     Status: None   Collection Time: 07/08/20  7:39 PM   Specimen: BLOOD  Result Value Ref Range Status   Specimen Description BLOOD LEFT ANTECUBITAL  Final   Special Requests   Final    BOTTLES DRAWN AEROBIC AND ANAEROBIC Blood Culture adequate volume   Culture   Final    NO GROWTH 5 DAYS Performed at [facility], [place] [address], [place], [place] [address]    Report Status 07/13/2020 FINAL  Final  Respiratory Panel by RT PCR (Flu A&B, Covid) - Nasopharyngeal Swab     Status: None   Collection Time: 07/08/20 11:30 PM   Specimen: Nasopharyngeal Swab  Result Value Ref Range Status   SARS Coronavirus 2 by RT PCR NEGATIVE NEGATIVE Final    Comment: (NOTE) SARS-CoV-2 target nucleic acids are NOT DETECTED.  The SARS-CoV-2 RNA is generally detectable in upper respiratoy specimens during the acute phase of infection. The lowest concentration of SARS-CoV-2 viral copies this assay can detect is 131 copies/mL. A negative result does not preclude SARS-Cov-2 infection and should not be used as the sole basis for treatment or other patient management decisions. A negative result may occur with  improper specimen collection/handling, submission of specimen other than nasopharyngeal swab, presence of viral mutation(s) within the areas targeted by this assay,  and inadequate number of viral copies (<131 copies/mL). A negative result must be combined with clinical observations, patient history, and epidemiological information.  The expected result is Negative.  Fact Sheet for Patients:  PinkCheek.be  Fact Sheet for Healthcare Providers:  GravelBags.it  This test is no t yet approved or cleared by the [place] FDA and  has been authorized for detection and/or diagnosis of SARS-CoV-2 by FDA under an Emergency Use Authorization (EUA). This EUA will remain  in effect (meaning this test can be used) for the duration of the COVID-19 declaration under Section 564(b)(1) of the Act, 21 U.S.C. section 360bbb-3(b)(1), unless the authorization is terminated or revoked sooner.     Influenza A by PCR NEGATIVE NEGATIVE Final   Influenza B by PCR NEGATIVE NEGATIVE Final    Comment: (NOTE) The Xpert Xpress SARS-CoV-2/FLU/RSV assay is intended as an aid in  the diagnosis of influenza from Nasopharyngeal swab specimens and  should not be used as a sole basis for treatment. Nasal washings and  aspirates are unacceptable for Xpert Xpress SARS-CoV-2/FLU/RSV  testing.  Fact Sheet for Patients: PinkCheek.be  Fact Sheet for Healthcare Providers: GravelBags.it  This test is not yet approved or cleared by the [place] FDA and  has been authorized for detection and/or diagnosis of SARS-CoV-2 by  FDA under an Emergency Use Authorization (EUA). This EUA will remain  in effect (meaning this test can be used) for the duration of the  Covid-19 declaration under Section 564(b)(1) of the Act, 21  U.S.C. section 360bbb-3(b)(1), unless the authorization is  terminated or revoked. Performed at [facility], [place] [address], [place], [place] [address]   Aerobic/Anaerobic Culture (surgical/deep wound)     Status: None   Collection Time:  07/09/20  9:21 AM   Specimen: Abscess  Result Value Ref Range Status   Specimen Description ABSCESS  Final   Special Requests NONE  Final   Gram Stain   Final    RARE WBC PRESENT,BOTH PMN AND MONONUCLEAR RARE GRAM POSITIVE COCCI IN PAIRS IN CLUSTERS    Culture   Final    RARE GROUP B STREP(S.AGALACTIAE)ISOLATED TESTING AGAINST S. AGALACTIAE NOT ROUTINELY PERFORMED DUE TO PREDICTABILITY OF AMP/PEN/VAN SUSCEPTIBILITY. RARE PROTEUS MIRABILIS NO ANAEROBES ISOLATED Performed at [facility], [place] [address], [place], [place] [address]    Report Status 07/14/2020 FINAL  Final   Organism ID, Bacteria PROTEUS MIRABILIS  Final      Susceptibility   Proteus mirabilis - MIC*    AMPICILLIN <=2 SENSITIVE Sensitive     CEFAZOLIN <=4 SENSITIVE Sensitive     CEFEPIME <=0.12 SENSITIVE Sensitive     CEFTAZIDIME <=1 SENSITIVE Sensitive     CEFTRIAXONE <=0.25 SENSITIVE Sensitive     CIPROFLOXACIN <=0.25 SENSITIVE Sensitive     GENTAMICIN <=1 SENSITIVE Sensitive     IMIPENEM 1 SENSITIVE Sensitive     TRIMETH/SULFA <=20 SENSITIVE Sensitive     AMPICILLIN/SULBACTAM <=2 SENSITIVE Sensitive     PIP/TAZO <=4 SENSITIVE Sensitive     * RARE PROTEUS MIRABILIS  MRSA PCR Screening     Status: None   Collection Time: 07/10/20  3:08 PM   Specimen: Nasal Mucosa; Nasopharyngeal  Result Value Ref Range Status   MRSA by PCR NEGATIVE NEGATIVE Final    Comment:        The GeneXpert MRSA Assay (FDA approved for NASAL specimens only), is one component of a comprehensive MRSA colonization surveillance program. It is not intended to diagnose MRSA infection nor to guide or monitor treatment for MRSA infections. Performed at [facility], [place] [address], [place], [place]  [place]          Radiology Studies: No results found.      Scheduled Meds: . Chlorhexidine Gluconate Cloth  6 each Topical Daily  . docusate  100 mg Oral BID  . heparin injection (subcutaneous)  5,000 Units  Subcutaneous Q8H  . levothyroxine  50 mcg Oral QAC breakfast  . liver oil-zinc oxide   Topical TID  . magic mouthwash  5 mL Oral BID  . mouth rinse  15 mL Mouth Rinse BID  . mirtazapine  15 mg Oral QHS  . multivitamin with minerals  1 tablet Oral Daily  . polyethylene glycol  17 g Oral Daily  . sertraline  75 mg Oral Daily  . white petrolatum       Continuous Infusions: . sodium chloride 250 mL (07/17/20 0607)  .  ceFAZolin (ANCEF) IV 2 g (07/18/20 0635)     LOS: 10 days    Time spent: 35 minutes.     Elmarie Shiley, MD [facility]   If 7PM-7AM, please contact night-coverage [email or web address]  07/18/2020, 12:10 PM

## 2020-07-18 NOTE — Progress Notes (Signed)
Patient cleared for discharge from my standpoint.  Recommend daily wound care with K-Y jelly, 4 x 4's, ABD and the mesh underwear.  He should follow up with me in my office in 2 weeks.

## 2020-07-19 DIAGNOSIS — N492 Inflammatory disorders of scrotum: Secondary | ICD-10-CM | POA: Diagnosis not present

## 2020-07-19 DIAGNOSIS — M542 Cervicalgia: Secondary | ICD-10-CM | POA: Diagnosis not present

## 2020-07-19 DIAGNOSIS — I739 Peripheral vascular disease, unspecified: Secondary | ICD-10-CM | POA: Diagnosis not present

## 2020-07-19 DIAGNOSIS — L89102 Pressure ulcer of unspecified part of back, stage 2: Secondary | ICD-10-CM | POA: Diagnosis not present

## 2020-07-19 DIAGNOSIS — F419 Anxiety disorder, unspecified: Secondary | ICD-10-CM | POA: Diagnosis not present

## 2020-07-19 DIAGNOSIS — F329 Major depressive disorder, single episode, unspecified: Secondary | ICD-10-CM | POA: Diagnosis not present

## 2020-07-19 DIAGNOSIS — M5459 Other low back pain: Secondary | ICD-10-CM | POA: Diagnosis not present

## 2020-07-19 DIAGNOSIS — L89610 Pressure ulcer of right heel, unstageable: Secondary | ICD-10-CM | POA: Diagnosis not present

## 2020-07-19 DIAGNOSIS — R1312 Dysphagia, oropharyngeal phase: Secondary | ICD-10-CM | POA: Diagnosis not present

## 2020-07-19 DIAGNOSIS — G309 Alzheimer's disease, unspecified: Secondary | ICD-10-CM | POA: Diagnosis not present

## 2020-07-19 DIAGNOSIS — Z7401 Bed confinement status: Secondary | ICD-10-CM | POA: Diagnosis not present

## 2020-07-19 DIAGNOSIS — R52 Pain, unspecified: Secondary | ICD-10-CM | POA: Diagnosis not present

## 2020-07-19 DIAGNOSIS — M2550 Pain in unspecified joint: Secondary | ICD-10-CM | POA: Diagnosis not present

## 2020-07-19 DIAGNOSIS — N390 Urinary tract infection, site not specified: Secondary | ICD-10-CM | POA: Diagnosis not present

## 2020-07-19 DIAGNOSIS — D649 Anemia, unspecified: Secondary | ICD-10-CM | POA: Diagnosis not present

## 2020-07-19 DIAGNOSIS — G8929 Other chronic pain: Secondary | ICD-10-CM | POA: Diagnosis not present

## 2020-07-19 DIAGNOSIS — M726 Necrotizing fasciitis: Secondary | ICD-10-CM | POA: Diagnosis not present

## 2020-07-19 DIAGNOSIS — K5900 Constipation, unspecified: Secondary | ICD-10-CM | POA: Diagnosis not present

## 2020-07-19 DIAGNOSIS — G9009 Other idiopathic peripheral autonomic neuropathy: Secondary | ICD-10-CM | POA: Diagnosis not present

## 2020-07-19 DIAGNOSIS — N400 Enlarged prostate without lower urinary tract symptoms: Secondary | ICD-10-CM | POA: Diagnosis not present

## 2020-07-19 DIAGNOSIS — E46 Unspecified protein-calorie malnutrition: Secondary | ICD-10-CM | POA: Diagnosis not present

## 2020-07-19 DIAGNOSIS — K219 Gastro-esophageal reflux disease without esophagitis: Secondary | ICD-10-CM | POA: Diagnosis not present

## 2020-07-19 DIAGNOSIS — F32A Depression, unspecified: Secondary | ICD-10-CM | POA: Diagnosis not present

## 2020-07-19 DIAGNOSIS — Z8781 Personal history of (healed) traumatic fracture: Secondary | ICD-10-CM | POA: Diagnosis not present

## 2020-07-19 DIAGNOSIS — F331 Major depressive disorder, recurrent, moderate: Secondary | ICD-10-CM | POA: Diagnosis not present

## 2020-07-19 DIAGNOSIS — R5381 Other malaise: Secondary | ICD-10-CM | POA: Diagnosis not present

## 2020-07-19 DIAGNOSIS — G609 Hereditary and idiopathic neuropathy, unspecified: Secondary | ICD-10-CM | POA: Diagnosis not present

## 2020-07-19 DIAGNOSIS — L602 Onychogryphosis: Secondary | ICD-10-CM | POA: Diagnosis not present

## 2020-07-19 DIAGNOSIS — R293 Abnormal posture: Secondary | ICD-10-CM | POA: Diagnosis not present

## 2020-07-19 DIAGNOSIS — E785 Hyperlipidemia, unspecified: Secondary | ICD-10-CM | POA: Diagnosis not present

## 2020-07-19 DIAGNOSIS — E039 Hypothyroidism, unspecified: Secondary | ICD-10-CM | POA: Diagnosis not present

## 2020-07-19 DIAGNOSIS — R2681 Unsteadiness on feet: Secondary | ICD-10-CM | POA: Diagnosis not present

## 2020-07-19 DIAGNOSIS — I83015 Varicose veins of right lower extremity with ulcer other part of foot: Secondary | ICD-10-CM | POA: Diagnosis not present

## 2020-07-19 DIAGNOSIS — F0390 Unspecified dementia without behavioral disturbance: Secondary | ICD-10-CM | POA: Diagnosis not present

## 2020-07-19 LAB — CBC
HCT: 23.2 % — ABNORMAL LOW (ref 39.0–52.0)
Hemoglobin: 7.3 g/dL — ABNORMAL LOW (ref 13.0–17.0)
MCH: 29.1 pg (ref 26.0–34.0)
MCHC: 31.5 g/dL (ref 30.0–36.0)
MCV: 92.4 fL (ref 80.0–100.0)
Platelets: 160 10*3/uL (ref 150–400)
RBC: 2.51 MIL/uL — ABNORMAL LOW (ref 4.22–5.81)
RDW: 17.2 % — ABNORMAL HIGH (ref 11.5–15.5)
WBC: 6.2 10*3/uL (ref 4.0–10.5)
nRBC: 0 % (ref 0.0–0.2)

## 2020-07-19 LAB — GLUCOSE, CAPILLARY
Glucose-Capillary: 110 mg/dL — ABNORMAL HIGH (ref 70–99)
Glucose-Capillary: 80 mg/dL (ref 70–99)
Glucose-Capillary: 85 mg/dL (ref 70–99)

## 2020-07-19 LAB — RESPIRATORY PANEL BY RT PCR (FLU A&B, COVID)
Influenza A by PCR: NEGATIVE
Influenza B by PCR: NEGATIVE
SARS Coronavirus 2 by RT PCR: NEGATIVE

## 2020-07-19 MED ORDER — TRAMADOL HCL 50 MG PO TABS
50.0000 mg | ORAL_TABLET | Freq: Three times a day (TID) | ORAL | 0 refills | Status: DC | PRN
Start: 2020-07-19 — End: 2020-10-03

## 2020-07-19 MED ORDER — FERROUS SULFATE 325 (65 FE) MG PO TABS: 325.0000 mg | ORAL_TABLET | Freq: Every day | ORAL | 3 refills | Status: AC

## 2020-07-19 MED ORDER — ZINC OXIDE 40 % EX OINT: TOPICAL_OINTMENT | Freq: Three times a day (TID) | CUTANEOUS | 0 refills | Status: AC

## 2020-07-19 NOTE — Evaluation (Addendum)
Occupational Therapy Evaluation Patient Details Name: Wayne [name] MRN: [id number] DOB: 11/22/1925 Today's Date: 07/19/2020    History of Present Illness 86yo male admitted 9/27 for perineal necrotizing fasciitis. Received I&D of scrotum 9/28, then further debridement of scrotum 10/5. PMH scoliosis, HTN, HLD, dementia, CHF, CA, back pain, toe surgery, lumbar and cervical surgery, L hip IM Nail   Clinical Impression   PTA patient reports needing assist for most ADLs and mobility, using RW, but able to feed and groom self; he reports being at [facility] receiving limited therapy--but noted poor historian with hx of dementia.  Patient admitted for above and limited by problem list below, including generalized weakness, decreased activity tolerance, impaired balance, and impaired cognition.  He is oriented to self, place and year (but not month), follows commands with increased time but poor problem solving, attention and sequencing. Patient requires max-total assist for bed mobility, at best min guard for static sitting at EOB and overall min-total assist for self care at EOB/supine level. Believe he will benefit from further OT services while admitted and after dc at SNF level to decrease burden of care and assist patient to return to PLOF. Will follow acutely.     Follow Up Recommendations  SNF;Supervision/Assistance - 24 hour    Equipment Recommendations  Other (comment) (TBD at next venue of care )    Recommendations for Other Services       Precautions / Restrictions Precautions Precautions: Fall;Other (comment) Precaution Comments: scrotal wound- would limit scooting and length of time sitting without specialized cushion Restrictions Weight Bearing Restrictions: No      Mobility Bed Mobility Overal bed mobility: Needs Assistance Bed Mobility: Supine to Sit;Sit to Supine     Supine to sit: Max assist;HOB elevated Sit to supine: Total assist   General bed mobility comments:  max assist to transition to EOB with cueing for initation and sequencing, pt able to intiate BLEs and trunk but overall needing max assist; total assist to reposition back to supine   Transfers                 General transfer comment: deferred     Balance Overall balance assessment: Needs assistance;History of Falls Sitting-balance support: No upper extremity supported;Feet supported;Single extremity supported Sitting balance-Leahy Scale: Poor Sitting balance - Comments: min guard at best, increases with fatigue and 0 hand support; L lateral lean on elbow preference Postural control: Posterior lean;Left lateral lean                                 ADL either performed or assessed with clinical judgement   ADL Overall ADL's : Needs assistance/impaired     Grooming: Minimal assistance;Sitting   Upper Body Bathing: Moderate assistance;Sitting   Lower Body Bathing: Maximal assistance;Sitting/lateral leans;Bed level Lower Body Bathing Details (indicate cue type and reason): requires assist to reach feet, poor balance requiring min guard at best statically  Upper Body Dressing : Moderate assistance;Sitting   Lower Body Dressing: Total assistance;Bed level;Sitting/lateral leans     Toilet Transfer Details (indicate cue type and reason): deferred         Functional mobility during ADLs: Maximal assistance;Total assistance;Cueing for safety;Cueing for sequencing General ADL Comments: pt limited by weakness, balance, cognition and tolerance     Vision         Perception     Praxis      Pertinent Vitals/Pain Pain Assessment:  Faces Faces Pain Scale: Hurts a little bit Pain Location: back Pain Descriptors / Indicators: Discomfort;Grimacing;Guarding Pain Intervention(s): Limited activity within patient's tolerance;Monitored during session;Repositioned     Hand Dominance Right   Extremity/Trunk Assessment Upper Extremity Assessment Upper Extremity  Assessment: Generalized weakness (limited L shoulder FF to 90*)   Lower Extremity Assessment Lower Extremity Assessment: Defer to PT evaluation   Cervical / Trunk Assessment Cervical / Trunk Assessment: Kyphotic   Communication Communication Communication: No difficulties   Cognition Arousal/Alertness: Awake/alert Behavior During Therapy: WFL for tasks assessed/performed Overall Cognitive Status: History of cognitive impairments - at baseline                                 General Comments: dementia at baseline, pleasant and cooperative, oriented to year but not month; follows simple commands with increased tmie    General Comments  VSS     Exercises     Shoulder Instructions      Home Living Family/patient expects to be discharged to:: Skilled nursing facility                                 Additional Comments: per chart, has been bed bound since fall over a year ago but he reports he walks about 78ft with someone's help at [facility]      Prior Functioning/Environment Level of Independence: Needs assistance    ADL's / Homemaking Assistance Needed: pt reports needing assist for most ADLs, staff at [facility] assist with toileting, bathing, and transfers --he is able to self feed and groom    Comments: poor historian        OT Problem List: Decreased strength;Decreased activity tolerance;Impaired balance (sitting and/or standing);Decreased cognition;Decreased safety awareness;Decreased knowledge of use of DME or AE;Decreased knowledge of precautions;Pain      OT Treatment/Interventions: Self-care/ADL training;Therapeutic exercise;DME and/or AE instruction;Therapeutic activities;Patient/family education;Balance training;Cognitive remediation/compensation    OT Goals(Current goals can be found in the care plan section) Acute Rehab OT Goals Patient Stated Goal: to get back to [facility]  OT Goal Formulation: With patient Time For Goal Achievement:  08/02/20 Potential to Achieve Goals: Good  OT Frequency: Min 2X/week   Barriers to D/C:            Co-evaluation              AM-PAC OT "6 Clicks" Daily Activity     Outcome Measure Help from another person eating meals?: A Little Help from another person taking care of personal grooming?: A Little Help from another person toileting, which includes using toliet, bedpan, or urinal?: Total Help from another person bathing (including washing, rinsing, drying)?: A Lot Help from another person to put on and taking off regular upper body clothing?: A Lot Help from another person to put on and taking off regular lower body clothing?: Total 6 Click Score: 12   End of Session Nurse Communication: Mobility status  Activity Tolerance: Patient tolerated treatment well Patient left: in bed;with bed alarm set;with call bell/phone within reach;with SCD's reapplied  OT Visit Diagnosis: Other abnormalities of gait and mobility (R26.89);Muscle weakness (generalized) (M62.81);Pain;History of falling (Z91.81) Pain - part of body:  (back)                Time: 4401-0272 OT Time Calculation (min): 19 min Charges:  OT General Charges $OT Visit: 1 Visit OT Evaluation $  OT Eval Moderate Complexity: [place], OT Acute Rehabilitation Services Pager [phone] Office [phone]   Delight Stare 07/19/2020, 10:17 AM

## 2020-07-19 NOTE — Discharge Summary (Signed)
Physician Discharge Summary  Wayne [name] IPJ:[id number] DOB: Jul 28, 1926 DOA: 07/08/2020  PCP: Isaac Bliss, Rayford Halsted, MD  Admit date: 07/08/2020 Discharge date: 07/19/2020  Admitted From: SNF Disposition:  SNF  Recommendations for Outpatient Follow-up:  1. Follow up with PCP in 1-2 weeks 2. Please obtain BMP/CBC in one week 3. Needs to follow up with Dr Mingo Amber in 2 weeks 4. Needs to follow up with [facility]  5. Needs to be follow up by palliative care team.     Discharge Condition: Stable.  CODE STATUS:DNR Diet recommendation: Dysphagia 2 Diet, nectar Thick  Brief/Interim Summary: 86 year old from skilled nursing facility with cognitive deficit, chronic diastolic heart failure, severe protein caloric malnutrition, failure to thrive was admitted to the ICU on 9/27 with septic shock secondary to perineal necrotizing fasciitis. -Underwent extensive debridement in the OR by urology and general surgery, remained hypotensive in PACU, admitted to ICU on vasopressors. -Blood pressure improved, transferred to [facility] service 9/30. -Underwent wound matrix mesh on 10/05.   1-Septic shock, necrotizing fasciitis of his scrotum/perineum; -Treated with broad-spectrum antibiotics, IV pressors, IV fluids and albumin in the ICU -Sepsis physiology has resolved. -Blood cultures negative.  Intraoperative cultures with rare group B strep, rare Proteus, vancomycin and Flagyl discontinued after 7 days, discontinue cefepime after 9 days, Currently  on IV Ancef. -He has received 12 days of IV antibiotics. Discussed with Dr Claudia Desanctis, no further antibiotics needed at discharge.  -Urology input appreciated. -Plastic surgery following, patient underwent wound matrix mesh on 10/05 -Palliative care also following, palliative to follow at a skilled nursing facility with plans for hospice if he declined further. -Vitals stables.  -Stable for discharge, wound care daily with K-Y Jelly, 4x4's ABD  and the mesh underwear.  -Needs to follow up with Dr Mingo Amber in 2 weeks.  Needs to follow up with urology in 1 week.    2-Severe protein caloric malnutrition, adult failure to thrive, severe debility, third spacing: -Prognosis is poor -Continue to encourage oral intake.  -Palliative care follow-up at a skilled nursing facility.  Dysphagia: Acute on chronic: Continue with dysphagia 2 diet  Acute kidney injury: in thesetting of sepsis and shock improved. Has third spacing and edema, holding on IV fluids.  Bilateral hydronephrosis, chronic bladder outlet obstruction: Continue with Foley catheter at discharge in the setting of extensive perineal wound Needs to follow up with urology  Indeterminate right lower lobe level lesion: Follow-up as an outpatient.  Chronic anemia; monitor hb. Anemia panel consistent with anemia of chronic diseases.  Started on ferrous sulfate. Follow hb. LE; due to third spacing, hypoalbuminemia.     Discharge Diagnoses:  Principal Problem:   Cellulitis of scrotum Active Problems:   Hypothyroidism   Hyperlipidemia   Essential hypertension   Dementia (HCC)   Stercoral colitis   Obstructive uropathy   Bilateral hydronephrosis   Sepsis due to undetermined organism ([place])   AKI (acute kidney injury) ([place])   Acute UTI (urinary tract infection)   Hyperkalemia   Pressure injury of skin   Goals of care, counseling/discussion   Palliative care by specialist   Scrotal infection   DNR (do not resuscitate)   DNI (do not intubate)    Discharge Instructions  Discharge Instructions    Diet - low sodium heart healthy   Complete by: As directed    Discharge wound care:   Complete by: As directed    daily wound care with K-Y jelly, 4 x 4's, ABD and the mesh  underwear.   Increase activity slowly   Complete by: As directed      Allergies as of 07/19/2020      Reactions   Doxazosin Mesylate Other (See Comments)   Makes blood pressure    Propoxyphene Hcl Other (See Comments)   REACTION: upset stomach   Penicillins Rash   Tolerates cephalosporins including cefazolin  DID THE REACTION INVOLVE: Swelling of the face/tongue/throat, SOB, or low BP? No Sudden or severe rash/hives, skin peeling, or the inside of the mouth or nose? Yes Did it require medical treatment? Yes When did it last happen?50 yrs ago If all above answers are "NO", may proceed with cephalosporin use.      Medication List    STOP taking these medications   atenolol 50 MG tablet Commonly known as: TENORMIN   donepezil 5 MG tablet Commonly known as: ARICEPT   Eliquis 2.5 MG Tabs tablet Generic drug: apixaban   levofloxacin 500 MG tablet Commonly known as: LEVAQUIN   LORazepam 0.5 MG tablet Commonly known as: ATIVAN   torsemide 20 MG tablet Commonly known as: DEMADEX     TAKE these medications   acetaminophen 325 MG tablet Commonly known as: TYLENOL Take 650 mg by mouth in the morning, at noon, and at bedtime.   Dermacloud Crea Apply 1 application topically in the morning, at noon, and at bedtime.   ferrous sulfate 325 (65 FE) MG tablet Take 1 tablet (325 mg total) by mouth daily with breakfast. Start taking on: July 20, 2020   levothyroxine 25 MCG tablet Commonly known as: SYNTHROID Take 50 mcg by mouth daily before breakfast. What changed: Another medication with the same name was removed. Continue taking this medication, and follow the directions you see here.   liver oil-zinc oxide 40 % ointment Commonly known as: DESITIN Apply topically 3 (three) times daily.   magic mouthwash Soln Take 5 mLs by mouth in the morning and at bedtime.   mirtazapine 15 MG tablet Commonly known as: REMERON Take 15 mg by mouth at bedtime.   polyethylene glycol 17 g packet Commonly known as: MIRALAX / GLYCOLAX Take 17 g by mouth daily as needed for mild constipation.   PROSTAT PO Take 30 mLs by mouth in the morning and at bedtime.    senna-docusate 8.6-50 MG tablet Commonly known as: Senokot-S Take 1 tablet by mouth at bedtime as needed for mild constipation.   sertraline 25 MG tablet Commonly known as: ZOLOFT Take 75 mg by mouth daily.   simvastatin 40 MG tablet Commonly known as: ZOCOR TAKE 1 TABLET BY MOUTH EVERY DAY AT NIGHT   traMADol 50 MG tablet Commonly known as: ULTRAM Take 1 tablet (50 mg total) by mouth 3 (three) times daily as needed for moderate pain or severe pain.            Discharge Care Instructions  (From admission, onward)         Start     Ordered   07/19/20 0000  Discharge wound care:       Comments: daily wound care with K-Y jelly, 4 x 4's, ABD and the mesh underwear.   07/19/20 0954          Allergies  Allergen Reactions  . Doxazosin Mesylate Other (See Comments)    Makes blood pressure  . Propoxyphene Hcl Other (See Comments)    REACTION: upset stomach  . Penicillins Rash    Tolerates cephalosporins including cefazolin  DID THE REACTION INVOLVE: Swelling of  the face/tongue/throat, SOB, or low BP? No Sudden or severe rash/hives, skin peeling, or the inside of the mouth or nose? Yes Did it require medical treatment? Yes When did it last happen?50 yrs ago If all above answers are "NO", may proceed with cephalosporin use.     Consultations:  Plastic sx  Urology    Procedures/Studies: CT ABDOMEN PELVIS WO CONTRAST  Result Date: 07/08/2020 CLINICAL DATA:  Concern for scrotal abscess/Fournier's gangrene EXAM: CT ABDOMEN AND PELVIS WITHOUT CONTRAST TECHNIQUE: Multidetector CT imaging of the abdomen and pelvis was performed following the standard protocol without IV contrast. COMPARISON:  CT L-spine 07/20/2011, chest radiograph 07/08/2020 FINDINGS: Lower chest: Chronic appearing chest wall deformity is noted likely related to severe scoliotic curvature. There are bandlike and consolidative opacities in the bilateral lung bases centered upon thickened lower lobe  airways bilaterally which could reflect some atelectatic change and/or scarring though sequela of aspiration or infection could have a similar appearance. Normal heart size. No pericardial effusion. Three-vessel coronary artery atherosclerosis is noted. Hypoattenuation of the cardiac blood pool rib likely reflecting some mild anemia. Atherosclerotic calcification in the visible portions of the thoracic aorta. Hepatobiliary: Incompletely characterized peripherally hypoattenuating, centrally isoattenuating lesion in the inferior right lobe liver measuring approximately 3.8 x 4.8 x 5.2 cm (6/54, 3/24). No other focal concerning liver lesions. Gallbladder distention is top-normal. Partially calcified gallstone at the gallbladder neck. No pericholecystic fluid or inflammation. No biliary ductal dilatation or intraductal gallstones. Pancreas: Partial fatty replacement of the pancreas. No pancreatic ductal dilatation or surrounding inflammatory changes. Spleen: Normal in size. No concerning splenic lesions. Adrenals/Urinary Tract: Normal adrenal glands. Kidneys are symmetric in size and normally located. There are scattered subcentimeter hypoattenuating foci in both kidneys too small to fully characterize on CT imaging but statistically likely benign. Hyperdense renal pyramids left, a nonspecific finding but can be seen urinary obstruction, metabolic derangement or papillary necrosis among other etiologies. There is moderate bilateral hydroureteronephrosis without obstructing calculus. Urinary bladder is circumferentially thickened with numerous bladder diverticula. Stomach/Bowel: Distal esophagus, stomach and duodenal sweep are unremarkable. No small bowel wall thickening or dilatation. No evidence of obstruction. A normal appendix is visualized. Interposition of the hepatic flexure anterior to the liver. There is a large inspissated rectal stool ball with circumferential rectal wall thickening and inflammation as well as  stranding in the presacral space. Marked thickening is also seen along the levator plate. Feculent material noted within the gluteal cleft. Vascular/Lymphatic: Atherosclerotic calcifications within the abdominal aorta and branch vessels. Marked tortuosity of the abdominal aorta without frank aneurysm or ectasia. No enlarged abdominopelvic lymph nodes. Reproductive: Nyoka Lint defect in the region of the prostate, could reflect prior prostate intervention. Some mild thickening and heterogeneous enhancement of the prostate could reflect a concomitant prostatitis. Seminal vesicles are poorly visualized. Other: There is extensive thickening along the levator plate. Soft tissue thickening and subcutaneous phlegmon noted along the left gluteal cleft with overlying skin thickening. There is a multilobulated, thick-walled fluid collection extending from this region of phlegmon anteriorly to the base of the penile corpora the inferior extent of these collections and pudendal surfaces incompletely included within the margins of imaging. No soft tissue gas is seen though a necrotizing infection is not fully excluded on an imaging basis. Presacral fat stranding and perirectal thickening, as detailed above. Mild circumferential body wall edema. Neurostimulator battery pack in the soft tissues of the right flank entering the canal at the T11 level. Terminating above the level of imaging. Musculoskeletal: The osseous  structures appear diffusely demineralized which may limit detection of small or nondisplaced fractures. No acute or suspicious osseous lesions are evident. Severe dextrocurvature of the spine. Bony fusion across the L1-L4 levels with severe dextrocurvature at this site. Associated pelvic tilt is noted as well. There is a grade 2 anterolisthesis L5 on S1 and 7 mm of retrolisthesis L4 on L5. Bilateral L5 pars defects are noted. Severe resulting foraminal stenosis at this level. Additional moderate canal impingement C3-4,  C4-5. Few scattered benign bone islands. Prior left femoral intramedullary nail and transcervical pin placement traversing a healed deformity of a left intertrochanteric femur fracture. IMPRESSION: 1. Soft tissue thickening and subcutaneous phlegmon along the left gluteal cleft with overlying skin thickening. There is a multilobulated, thick-walled fluid collection extending from this region of phlegmon anteriorly to the base of the penile corpora. The inferior extent of these collections and pudendal surface is incompletely included within the margins of imaging. Associated thickening of the levator plate. A perianal abscess or fistula is not completely excluded. No soft tissue gas is seen though a necrotizing infection is not fully excluded on an imaging basis. Recommend correlation with direct visualization. 2. Large inspissated rectal stool ball with circumferential rectal wall thickening and inflammation as well as stranding in the presacral space. Findings are concerning for stercoral colitis. 3. Moderate bilateral hydroureteronephrosis without obstructing calculus. Circumferentially thickened urinary bladder with numerous bladder diverticula, suggestive of chronic outlet obstruction with possible superimposed cystitis. Recommend correlation with urinalysis. 4. Keyhole defect in the prostate may reflect prior prostate resection. Heterogeneous appearance of the prostate with adjacent stranding, could reflect concomitant prostatitis. 5. Hyperdense left renal pyramids , a nonspecific finding but can be seen urinary obstruction, metabolic derangement or papillary necrosis among other etiologies. 6. Indeterminate heterogeneous lesion in the inferior right lobe liver measuring approximately 3.8 x 4.8 x 5.2 cm, incompletely characterized on noncontrast CT. Recommend further evaluation with nonemergent liver protocol CT or MRI. 7. Cholelithiasis without evidence of acute cholecystitis. 8. Severe dextrocurvature of the  spine across fused L1-L4 levels. Spondylolysis and spondylolisthesis as described above. Severe bilateral foraminal narrowing L5-S1. Moderate canal narrowing L3-4, L4-5. 9. Aortic Atherosclerosis (ICD10-I70.0). These results were called by telephone at the time of interpretation on 07/08/2020 at 8:26 pm to provider DAVID YAO , who verbally acknowledged these results. Electronically Signed   By: Lovena Le M.D.   On: 07/08/2020 20:22   DG Chest 2 View  Result Date: 07/08/2020 CLINICAL DATA:  Cough for 2 weeks. EXAM: CHEST - 2 VIEW COMPARISON:  Single-view of the chest 10/11/2018. FINDINGS: The lungs are clear. Heart size is normal. Aortic atherosclerosis. No pneumothorax or pleural effusion. Cervical fusion hardware is unchanged. Inferior most screw on the left has partially backed out. Spinal stimulator noted. IMPRESSION: No acute disease. Aortic Atherosclerosis (ICD10-I70.0). Electronically Signed   By: Inge Rise M.D.   On: 07/08/2020 14:57   US Scrotum  Result Date: 07/08/2020 CLINICAL DATA:  86 year old with scrotal swelling. Rule out scrotal abscess. EXAM: ULTRASOUND OF SCROTUM TECHNIQUE: Complete ultrasound examination of the testicles, epididymis, and other scrotal structures was performed. COMPARISON:  Included portions from abdominopelvic CT earlier today. FINDINGS: Right testicle Measurements: 3.1 x 0.9 x 2.2 cm. No mass or microlithiasis visualized. Blood flow is noted. No intratesticular collection. There are extra testicular foci in the scrotum with posterior shadowing measuring up to 6 mm. No dirty shadowing to suggest air. Left testicle Measurements: 3.5 x 2.1 x 2.3 cm. No mass or microlithiasis visualized. Blood  flow is noted. No intratesticular collection. There are extratesticular foci in the scrotum with posterior shadowing measuring up to 8 mm. No dirty shadowing to suggest air. Right epididymis:  Normal in size and appearance. Left epididymis:  Normal in size and appearance.  Hydrocele:  Small on the right.  Fluid appears simple. Varicocele:  None visualized. Other: Diffuse bilateral skin and soft tissue thickening and edema measuring up to 2 cm. No discrete scrotal fluid collection. IMPRESSION: 1. Diffuse scrotal skin and soft tissue thickening measuring up to 2 cm typical cellulitis. No discrete scrotal fluid collection. 2. Small right hydrocele. 3. Extra testicular foci with posterior shadowing measuring up to 6 mm on the right and 8 mm on the left. These are most typical of calcifications and likely postinflammatory. 4. Please note the area of phlegmonous change in the perineum on CT was not evaluated on this scrotal ultrasound. Electronically Signed   By: Keith Rake M.D.   On: 07/08/2020 20:43   DG Swallowing Func-Speech Pathology  Result Date: 07/11/2020 Objective Swallowing Evaluation: Type of Study: MBS-Modified Barium Swallow Study  Patient Details Name: Wayne [name] MRN: [id number] Date of Birth: [birth year] Today's Date: 07/11/2020 Time: SLP Start Time (ACUTE ONLY): 0901 -SLP Stop Time (ACUTE ONLY): 0917 SLP Time Calculation (min) (ACUTE ONLY): 16 min Past Medical History: Past Medical History: Diagnosis Date . Anxiety   takes Atrivan daily . Back pain   chronic with neck pain . Cancer ([place])   skin . CHF (congestive heart failure) ([place])  . Constipation   related to medication . Dementia ([place])   takes Aricept nightly . Enlarged prostate   self caths once every 1-2wks . GERD (gastroesophageal reflux disease)   doesn't require medication . Hx of seasonal allergies  . Hyperlipidemia   takes Simvastatin daily . Hypertension   takes atenolol daily . Hypothyroidism   takes Synthroid daily . Insomnia   related to pain . MRSA (methicillin resistant staph aureus) culture positive   Per patient tested on 10/07/11. . Osteoporosis  . Peripheral edema   takes Torsemide daily . Scoliosis  Past Surgical History: Past Surgical History: Procedure Laterality Date . abdominal cyst     removed-2012;MRSA done by Dr.Gross . CATARACT EXTRACTION   . cataracts    bilateral . CERVICAL DISC SURGERY    x1 . COLONOSCOPY   . ESOPHAGOGASTRODUODENOSCOPY   . HERNIA REPAIR    Right inguinal . INCISION AND DRAINAGE OF WOUND N/A 07/09/2020  Procedure: IRRIGATION AND DEBRIDEMENT SCROTUM AND PERINEUM;  Surgeon: Rolm Bookbinder, MD;  Location: [place];  Service: General;  Laterality: N/A; . INCISION AND DRAINAGE OF WOUND N/A 07/09/2020  Procedure: DEBRIDEMENT OF SCROTUM;  Surgeon: Robley Fries, MD;  Location: [place];  Service: Urology;  Laterality: N/A; . INTRAMEDULLARY (IM) NAIL INTERTROCHANTERIC Left 10/12/2018 . INTRAMEDULLARY (IM) NAIL INTERTROCHANTERIC Left 10/12/2018  Procedure: INTRAMEDULLARY (IM) NAIL INTERTROCHANTRIC;  Surgeon: Renette Butters, MD;  Location: [place];  Service: Orthopedics;  Laterality: Left; . LUMBAR DISC SURGERY    x2 . TOE SURGERY    right great toe . TRANSURETHRAL RESECTION OF PROSTATE    15+yrs ago HPI: Wayne [name] is a 86 y.o. male with medical history significant of cervical fusion, anxiety, dementia, chronic back and neck pain, unspecified skin cancer, chronic diastolic CHF, constipation, BPH, GERD, hyperlipidemia, hypertension, hypothyroidism, peripheral edema, scoliosis, admitted with scrotal edema and erythema. Underwent debridement due to severe perineal infection (necrotizing sof tissue infection) with postoperative shock. Suspected to have chronic aspiration.  Palliative care meeting with son 9/28. CXR No acute disease  No data recorded Assessment / Plan / Recommendation CHL IP CLINICAL IMPRESSIONS 07/11/2020 Clinical Impression Pt has acute on chronic oropharyngeal dysphagia which he has managed well until past several weeks leading up to current illness. Overall, general deconditioning and evidence of cervical fusion contribute to worsening dysphagia. Impairments observed in timing of laryngeal closure with delayed epiglottic deflection resulting in penetration before the  swallow, during falling slightly below vocal cords with thin via straw (above cords with cup). Residue retained in vallecule mild, increased with solid. Esophageal scan unremarkable. Recommend nectar thick liquids, water protocol, Dys 2 texture, straws allowed, 2 swallows and full supervision and assist with meals. Pt may wish to return to regular diet/thin liquids if condiiton contiues to deteriorate. He is followed by Palliative care. ST will follow.     SLP Visit Diagnosis Dysphagia, pharyngeal phase (R13.13) Attention and concentration deficit following -- Frontal lobe and executive function deficit following -- Impact on safety and function Moderate aspiration risk;Severe aspiration risk   CHL IP TREATMENT RECOMMENDATION 07/11/2020 Treatment Recommendations Therapy as outlined in treatment plan below   Prognosis 07/11/2020 Prognosis for Safe Diet Advancement Fair Barriers to Reach Goals Cognitive deficits Barriers/Prognosis Comment -- CHL IP DIET RECOMMENDATION 07/11/2020 SLP Diet Recommendations Dysphagia 2 (Fine chop) solids;Nectar thick liquid Liquid Administration via Cup;Straw Medication Administration Whole meds with puree Compensations Slow rate;Small sips/bites;Clear throat intermittently;Multiple dry swallows after each bite/sip Postural Changes Seated upright at 90 degrees   CHL IP OTHER RECOMMENDATIONS 07/11/2020 Recommended Consults -- Oral Care Recommendations Oral care BID Other Recommendations Order thickener from pharmacy   CHL IP FOLLOW UP RECOMMENDATIONS 07/11/2020 Follow up Recommendations Skilled Nursing facility   [facility] IP FREQUENCY AND DURATION 07/11/2020 Speech Therapy Frequency (ACUTE ONLY) min 2x/week Treatment Duration 2 weeks      CHL IP ORAL PHASE 07/11/2020 Oral Phase Impaired Oral - Pudding Teaspoon -- Oral - Pudding Cup -- Oral - Honey Teaspoon -- Oral - Honey Cup -- Oral - Nectar Teaspoon -- Oral - Nectar Cup Right pocketing in lateral sulci;Left pocketing in lateral sulci Oral - Nectar  Straw -- Oral - Thin Teaspoon -- Oral - Thin Cup WFL Oral - Thin Straw WFL Oral - Puree -- Oral - Mech Soft Lingual/palatal residue Oral - Regular -- Oral - Multi-Consistency -- Oral - Pill -- Oral Phase - Comment --  CHL IP PHARYNGEAL PHASE 07/11/2020 Pharyngeal Phase Impaired Pharyngeal- Pudding Teaspoon -- Pharyngeal -- Pharyngeal- Pudding Cup -- Pharyngeal -- Pharyngeal- Honey Teaspoon -- Pharyngeal -- Pharyngeal- Honey Cup -- Pharyngeal -- Pharyngeal- Nectar Teaspoon -- Pharyngeal -- Pharyngeal- Nectar Cup Pharyngeal residue - valleculae Pharyngeal -- Pharyngeal- Nectar Straw -- Pharyngeal -- Pharyngeal- Thin Teaspoon -- Pharyngeal -- Pharyngeal- Thin Cup Penetration/Aspiration during swallow;Pharyngeal residue - valleculae Pharyngeal Material enters airway, remains ABOVE vocal cords and not ejected out Pharyngeal- Thin Straw Penetration/Aspiration before swallow;Penetration/Aspiration during swallow Pharyngeal Material enters airway, CONTACTS cords and not ejected out;Material enters airway, passes BELOW cords then ejected out Pharyngeal- Puree -- Pharyngeal -- Pharyngeal- Mechanical Soft Pharyngeal residue - valleculae Pharyngeal -- Pharyngeal- Regular -- Pharyngeal -- Pharyngeal- Multi-consistency -- Pharyngeal -- Pharyngeal- Pill -- Pharyngeal -- Pharyngeal Comment --  CHL IP CERVICAL ESOPHAGEAL PHASE 07/11/2020 Cervical Esophageal Phase WFL Pudding Teaspoon -- Pudding Cup -- Honey Teaspoon -- Honey Cup -- Nectar Teaspoon -- Nectar Cup -- Nectar Straw -- Thin Teaspoon -- Thin Cup -- Thin Straw -- Puree -- Mechanical Soft -- Regular -- Multi-consistency --  Pill -- Cervical Esophageal Comment -- [name] 07/11/2020, 10:37 AM [name] Colvin Caroli.Ed Actor Pager [phone] Office [phone]                 Subjective: He is alert, denies pain.   Discharge Exam: Vitals:   07/18/20 2046 07/19/20 0611  BP: 101/60 117/61  Pulse: 72 64  Resp: 20   Temp: 97.6 F (36.4  C) (!) 97.4 F (36.3 C)  SpO2: 99% 96%     General: Pt is alert, awake, not in acute distress Cardiovascular: RRR, S1/S2 +, no rubs, no gallops Respiratory: CTA bilaterally, no wheezing, no rhonchi Abdominal: Soft, NT, ND, bowel sounds + Extremities: no edema, no cyanosis    The results of significant diagnostics from this hospitalization (including imaging, microbiology, ancillary and laboratory) are listed below for reference.     Microbiology: Recent Results (from the past 240 hour(s))  MRSA PCR Screening     Status: None   Collection Time: 07/10/20  3:08 PM   Specimen: Nasal Mucosa; Nasopharyngeal  Result Value Ref Range Status   MRSA by PCR NEGATIVE NEGATIVE Final    Comment:        The GeneXpert MRSA Assay (FDA approved for NASAL specimens only), is one component of a comprehensive MRSA colonization surveillance program. It is not intended to diagnose MRSA infection nor to guide or monitor treatment for MRSA infections. Performed at [facility], [place] [address] [place], [place] [address]      Labs: BNP (last 3 results) No results for input(s): BNP in the last 8760 hours. Basic Metabolic Panel: Recent Labs  Lab 07/13/20 0452 07/14/20 0249 07/15/20 0504 07/16/20 0450 07/17/20 0048  NA 147* 142 142 141 141  K 2.9* 4.1 3.7 4.0 3.8  CL 121* 117* 119* 117* 115*  CO2 17* 17* 18* 16* 18*  GLUCOSE 99 112* 90 87 105*  BUN 24* 27* 26* 26* 21  CREATININE 1.19 1.24 1.15 1.09 1.06  CALCIUM 8.5* 8.3* 8.2* 8.2* 8.0*   Liver Function Tests: No results for input(s): AST, ALT, ALKPHOS, BILITOT, PROT, ALBUMIN in the last 168 hours. No results for input(s): LIPASE, AMYLASE in the last 168 hours. No results for input(s): AMMONIA in the last 168 hours. CBC: Recent Labs  Lab 07/12/20 1219 07/12/20 1219 07/13/20 0452 07/13/20 0452 07/14/20 0237 07/14/20 0237 07/15/20 0504 07/16/20 0450 07/17/20 0048 07/18/20 0149 07/19/20 0418  WBC 8.1   < > 8.2   < >  9.5   < > 7.0 8.2 9.9 6.6 6.2  NEUTROABS 5.8  --  5.4  --  6.1  --   --   --   --   --   --   HGB 7.9*   < > 8.5*   < > 8.6*   < > 8.1* 8.0* 7.8* 7.1* 7.3*  HCT 24.5*   < > 26.3*   < > 26.7*   < > 26.0* 25.8* 24.9* 23.1* 23.2*  MCV 89.4   < > 90.1   < > 89.6   < > 89.3 91.2 92.9 92.8 92.4  PLT 264   < > 245   < > 228   < > 171 155 153 141* 160   < > = values in this interval not displayed.   Cardiac Enzymes: No results for input(s): CKTOTAL, CKMB, CKMBINDEX, TROPONINI in the last 168 hours. BNP: Invalid input(s): POCBNP CBG: Recent Labs  Lab 07/18/20 1129 07/18/20 1652 07/18/20  2239 07/19/20 0034 07/19/20 0609  GLUCAP 91 88 104* 85 80   D-Dimer No results for input(s): DDIMER in the last 72 hours. Hgb A1c No results for input(s): HGBA1C in the last 72 hours. Lipid Profile No results for input(s): CHOL, HDL, LDLCALC, TRIG, CHOLHDL, LDLDIRECT in the last 72 hours. Thyroid function studies No results for input(s): TSH, T4TOTAL, T3FREE, THYROIDAB in the last 72 hours.  Invalid input(s): FREET3 Anemia work up Recent Labs    07/18/20 0826  VITAMINB12 770  FOLATE 10.1  FERRITIN 535*  TIBC 130*  IRON 46  RETICCTPCT 2.5   Urinalysis    Component Value Date/Time   COLORURINE BROWN (A) 07/08/2020 2232   APPEARANCEUR TURBID (A) 07/08/2020 2232   LABSPEC 1.011 07/08/2020 2232   PHURINE 6.0 07/08/2020 2232   GLUCOSEU NEGATIVE 07/08/2020 2232   HGBUR LARGE (A) 07/08/2020 2232   BILIRUBINUR NEGATIVE 07/08/2020 2232   BILIRUBINUR n 11/11/2015 1716   KETONESUR NEGATIVE 07/08/2020 2232   PROTEINUR 100 (A) 07/08/2020 2232   UROBILINOGEN 0.2 11/11/2015 1716   UROBILINOGEN 0.2 02/15/2012 1335   NITRITE NEGATIVE 07/08/2020 2232   LEUKOCYTESUR MODERATE (A) 07/08/2020 2232   Sepsis Labs Invalid input(s): PROCALCITONIN,  WBC,  LACTICIDVEN Microbiology Recent Results (from the past 240 hour(s))  MRSA PCR Screening     Status: None   Collection Time: 07/10/20  3:08 PM    Specimen: Nasal Mucosa; Nasopharyngeal  Result Value Ref Range Status   MRSA by PCR NEGATIVE NEGATIVE Final    Comment:        The GeneXpert MRSA Assay (FDA approved for NASAL specimens only), is one component of a comprehensive MRSA colonization surveillance program. It is not intended to diagnose MRSA infection nor to guide or monitor treatment for MRSA infections. Performed at [facility], [place] [address] [place], [place] [address]      Time coordinating discharge: 40 minutes  SIGNED:   Elmarie Shiley, MD  [facility]

## 2020-07-19 NOTE — Consult Note (Signed)
   [facility] CM Inpatient Consult   07/19/2020  Wayne [name] 1926/04/15 [id number]   [place] [facility] [ACO] Patient: Medicare NextGen    Patient screened for disposition.  Review of patient's medical record reveals patient is a long term resident at [facility] noted per inpatient TOC LCSW notes.  No [facility] Care Management follow up needs, as patient is a resident and needs to be met at the skilled facility.  For questions contact:   Natividad Brood, RN BSN [facility] Liaison  [phone] business mobile phone Toll free office [phone]  Fax number: [phone] [email or web address]

## 2020-07-19 NOTE — NC FL2 (Addendum)
[place] LEVEL OF CARE SCREENING TOOL     IDENTIFICATION  Patient Name: Wayne [name] Birthdate: 1925-10-31 Sex: male Admission Date (Current Location): 07/08/2020  [place] and [place] Number:  Kathleen Argue [id number] [place] and Address:  [facility], [place] [address] [place], [place] [address]      Provider Number: [id number]  Attending Physician Name and Address:  Elmarie Shiley, MD  Relative Name and Phone Number:  Dwon Sky - son; [phone]    Current Level of Care: Hospital Recommended Level of Care: [place] ([place]) Prior Approval Number:    Date Approved/Denied:   PASRR Number: [id number]  Discharge Plan: SNF    Current Diagnoses: Patient Active Problem List   Diagnosis Date Noted  . Scrotal infection   . DNR (do not resuscitate)   . DNI (do not intubate)   . Goals of care, counseling/discussion   . Palliative care by specialist   . Pressure injury of skin 07/09/2020  . Cellulitis of scrotum 07/08/2020  . Dementia ([place])   . Stercoral colitis   . Obstructive uropathy   . Bilateral hydronephrosis   . Sepsis due to undetermined organism ([place])   . AKI (acute kidney injury) ([place])   . Acute UTI (urinary tract infection)   . Hyperkalemia   . Hip fracture, unspecified laterality, closed, initial encounter ([place]) 10/11/2018  . Hip fracture ([place]) 10/11/2018  . Closed intertrochanteric fracture of left femur ([place]) 10/11/2018  . Viral URI with cough 10/20/2017  . Pneumonia of left lower lobe due to infectious organism 10/20/2017  . Renal insufficiency 01/25/2012  . Insomnia 12/21/2011  . Venous insufficiency 08/24/2011  . ALLERGIC RHINITIS 12/10/2008  . DERMATITIS, CONTACT, NOS 08/11/2007  . Short-term memory loss 08/02/2007  . Hypothyroidism 04/07/2007  . Hyperlipidemia 04/07/2007  . Essential hypertension 04/07/2007    Orientation RESPIRATION BLADDER Height & Weight     Self,  Situation, Place  Normal Continent, External catheter (Catheter placed 9/27) Weight: 139 lb 8.8 oz (63.3 kg) Height:  5' 7.5" (171.5 cm)  BEHAVIORAL SYMPTOMS/MOOD NEUROLOGICAL BOWEL NUTRITION STATUS      Incontinent Diet (DYS 2)  AMBULATORY STATUS COMMUNICATION OF NEEDS Skin   Total Care (Patient unable to ambulate with PT during evaluation) Verbally Other (Comment) (Pressure injury, stage 1-right lumbar; Non-pressure wound right great toe & second toe w/bruising/scab; closed incision perineum; closed incision groin; Ecchymosis right/left arm)                       Personal Care Assistance Level of Assistance  Bathing, Feeding, Dressing Bathing Assistance: Maximum assistance Feeding assistance: Limited assistance (Assistance with set-up) Dressing Assistance: Maximum assistance     Functional Limitations Info  Sight, Hearing, Speech Sight Info: Adequate Hearing Info: Adequate Speech Info: Adequate    SPECIAL CARE FACTORS FREQUENCY  PT (By licensed PT), OT (By licensed OT)     PT Frequency: PT evaluation 10/7 at hospital OT Frequency: OT evaluation 10/8 at hospital            Contractures Contractures Info: Not present    Additional Factors Info  Code Status, Allergies Code Status Info: DNR Allergies Info: Doxaaosin, Mesylate, Propaxyphene HCL, Penicillins           Current Medications (07/19/2020):  This is the current hospital active medication list Current Facility-Administered Medications  Medication Dose Route Frequency Provider Last Rate Last Admin  . 0.9 %  sodium chloride infusion  Intravenous PRN Domenic Polite, MD 10 mL/hr at 07/17/20 0607 250 mL at 07/17/20 0607  . acetaminophen (TYLENOL) tablet 650 mg  650 mg Oral Q6H PRN Norm Parcel, PA-C   650 mg at 07/12/20 1012   Or  . acetaminophen (TYLENOL) suppository 650 mg  650 mg Rectal Q6H PRN Norm Parcel, PA-C      . bisacodyl (DULCOLAX) suppository 10 mg  10 mg Rectal Daily PRN Meuth, Brooke A,  PA-C      . ceFAZolin (ANCEF) IVPB 2g/100 mL premix  2 g Intravenous Q8H Domenic Polite, MD 200 mL/hr at 07/19/20 0639 2 g at 07/19/20 0639  . Chlorhexidine Gluconate Cloth 2 % PADS 6 each  6 each Topical Daily Kipp Brood, MD   6 each at 07/18/20 1431  . docusate (COLACE) 50 MG/5ML liquid 100 mg  100 mg Oral BID Joselyn Glassman A, RPH   100 mg at 07/19/20 1043  . ferrous sulfate tablet 325 mg  325 mg Oral Q breakfast Regalado, Belkys A, MD   325 mg at 07/19/20 0909  . heparin injection 5,000 Units  5,000 Units Subcutaneous Q8H Kipp Brood, MD   5,000 Units at 07/19/20 8144  . levothyroxine (SYNTHROID) tablet 50 mcg  50 mcg Oral QAC breakfast Norm Parcel, PA-C   50 mcg at 07/19/20 8185  . liver oil-zinc oxide (DESITIN) 40 % ointment   Topical TID Norm Parcel, PA-C   Given at 07/19/20 1044  . magic mouthwash  5 mL Oral BID Norm Parcel, PA-C   5 mL at 07/19/20 1043  . MEDLINE mouth rinse  15 mL Mouth Rinse BID Agarwala, Ravi, MD   15 mL at 07/19/20 1045  . mirtazapine (REMERON) tablet 15 mg  15 mg Oral QHS Norm Parcel, PA-C   15 mg at 07/18/20 2127  . multivitamin with minerals tablet 1 tablet  1 tablet Oral Daily Domenic Polite, MD   1 tablet at 07/19/20 1042  . ondansetron (ZOFRAN) tablet 4 mg  4 mg Oral Q6H PRN Norm Parcel, PA-C       Or  . ondansetron [facility]) injection 4 mg  4 mg Intravenous Q6H PRN Norm Parcel, PA-C      . polyethylene glycol (MIRALAX / GLYCOLAX) packet 17 g  17 g Oral Daily Meuth, Brooke A, PA-C   17 g at 07/19/20 1043  . Resource ThickenUp Clear   Oral PRN Domenic Polite, MD      . senna-docusate (Senokot-S) tablet 1 tablet  1 tablet Oral QHS PRN Norm Parcel, PA-C   1 tablet at 07/18/20 2126  . sertraline (ZOLOFT) tablet 75 mg  75 mg Oral Daily Norm Parcel, PA-C   75 mg at 07/19/20 1042  . traMADol (ULTRAM) tablet 50 mg  50 mg Oral TID PRN Norm Parcel, PA-C   50 mg at 07/12/20 2250     Discharge Medications: Please  see discharge summary for a list of discharge medications.  Relevant Imaging Results:  Relevant Lab Results:   Additional Information [id number]  Sable Feil, LCSW

## 2020-07-19 NOTE — TOC Transition Note (Signed)
Transition of Care (TOC) - CM/SW Discharge Note *Discharged back to [facility]   Patient Details  Name: Wayne [name] MRN: [id number] Date of Birth: 06-18-1926  [facility] CM/SW Contact:  Sable Feil, LCSW Phone Number: 07/19/2020, 2:45 PM   Clinical Narrative:  Patient medically stable for discharge and is from [facility], where he is a LTC resident. Visited with patient this morning to advise patient of his readiness for discharged and confirmed with Mr. Moffatt that he is from [place]. Patient gave permission for his son Audie Stayer to be contacted. Call made to son [phone]) and informed him of patient's discharge today. Discharge clinicals transmitted to facility and negative COVID test results placed in discharge packet.     Final next level of care: [place] (Patient will d/c to skilled nursing facility for ST rehab, then back to LTC at [facility]) Barriers to Discharge: Barriers Resolved   Patient Goals and CMS Choice Patient states their goals for this hospitalization and ongoing recovery are:: Patient advised that he will return to SNF rehab and then transition back to LTC CMS Medicare.gov Compare Post Acute Care list provided to:: Other (Comment Required) (Not needed as patient from facility) Choice offered to / list presented to : NA  Discharge Placement   Existing PASRR number confirmed : 07/19/20          Patient chooses bed at: [facility] Patient to be transferred to facility by: Non-emergency ambulance transport Name of family member notified: Anda Latina, contacted by phone Patient and family notified of of transfer: 07/19/20  Discharge Plan and Services In-house Referral: Clinical Social Work                                  Social Determinants of Health ([place]) Interventions  No SDOH interventions requested or needed at  discharge.   Readmission Risk Interventions No flowsheet data found.

## 2020-07-19 NOTE — TOC Initial Note (Signed)
Transition of Care [facility]) - Initial/Assessment Note    Patient Details  Name: Wayne [name] MRN: [id number] Date of Birth: July 05, 1926  Transition of Care [facility]) CM/SW Contact:    Sable Feil, LCSW Phone Number: 07/19/2020, 11:00 AM  Clinical Narrative:  CSW talked with patient at the bedside regarding his discharge disposition. [name] was lying in bed and was alert, oriented and engaged easily with CSW. Patient confirmed he is from [facility] and he plans to return there. Son requested that CSW contact his son Wayne [name]. CSW talked with Olivia Mackie, admissions director at [facility] and informed her of patient's medical readiness for discharge.                 Expected Discharge Plan: [place] ([place]) Barriers to Discharge: Barriers Resolved   Patient Goals and CMS Choice Patient states their goals for this hospitalization and ongoing recovery are:: Patient agreeable to returning to [facility] [place] CMS Medicare.gov Compare Post Acute Care list provided to:: Other (Comment Required) (not needed as patient LTC at [facility]) Choice offered to / list presented to : NA  Expected Discharge Plan and Services Expected Discharge Plan: [place] ([place]) In-house Referral: Clinical Social Work     Living arrangements for the past 2 months: [place] Expected Discharge Date: 07/19/20                                   Prior Living Arrangements/Services Living arrangements for the past 2 months: [place] Lives with:: Facility Resident Patient language and need for interpreter reviewed:: No Do you feel safe going back to the place where you live?: Yes      Need for Family Participation in Patient Care: Yes (Comment) Care giver support system in place?: Yes (comment) (At the facility)   Criminal Activity/Legal Involvement Pertinent to Current  Situation/Hospitalization: No - Comment as needed  Activities of Daily Living   ADL Screening (condition at time of admission) Patient's cognitive ability adequate to safely complete daily activities?: Yes Is the patient deaf or have difficulty hearing?: No Does the patient have difficulty seeing, even when wearing glasses/contacts?: No Does the patient have difficulty concentrating, remembering, or making decisions?: No Patient able to express need for assistance with ADLs?: Yes Does the patient have difficulty dressing or bathing?: Yes Independently performs ADLs?: No Does the patient have difficulty walking or climbing stairs?: Yes Weakness of Legs: Both Weakness of Arms/Hands: Both  Permission Sought/Granted Permission sought to share information with : Family Supports Permission granted to share information with : Yes, Verbal Permission Granted  Share Information with NAME: Deshay Blumenfeld     Permission granted to share info w Relationship: Son  Permission granted to share info w Contact Information: [phone]  Emotional Assessment Appearance:: Appears stated age Attitude/Demeanor/Rapport: Engaged Affect (typically observed): Appropriate Orientation: : Oriented to Self, Oriented to Place, Oriented to Situation Alcohol / Substance Use: Tobacco Use, Alcohol Use, Illicit Drugs (Per H&P, patient does not smoke, drink alcohol or use illicit drugs) Psych Involvement: No (comment)  Admission diagnosis:  Cellulitis of scrotum [N49.2] Gluteal abscess [L02.31] Scrotal infection [N49.2] Patient Active Problem List   Diagnosis Date Noted  . Scrotal infection   . DNR (do not resuscitate)   . DNI (do not intubate)   . Goals of care, counseling/discussion   . Palliative care  by specialist   . Pressure injury of skin 07/09/2020  . Cellulitis of scrotum 07/08/2020  . Dementia ([place])   . Stercoral colitis   . Obstructive uropathy   . Bilateral hydronephrosis   . Sepsis due to  undetermined organism ([place])   . AKI (acute kidney injury) ([place])   . Acute UTI (urinary tract infection)   . Hyperkalemia   . Hip fracture, unspecified laterality, closed, initial encounter ([place]) 10/11/2018  . Hip fracture ([place]) 10/11/2018  . Closed intertrochanteric fracture of left femur ([place]) 10/11/2018  . Viral URI with cough 10/20/2017  . Pneumonia of left lower lobe due to infectious organism 10/20/2017  . Renal insufficiency 01/25/2012  . Insomnia 12/21/2011  . Venous insufficiency 08/24/2011  . ALLERGIC RHINITIS 12/10/2008  . DERMATITIS, CONTACT, NOS 08/11/2007  . Short-term memory loss 08/02/2007  . Hypothyroidism 04/07/2007  . Hyperlipidemia 04/07/2007  . Essential hypertension 04/07/2007   PCP:  Isaac Bliss, [place] Halsted, MD Pharmacy:   [id number] - [place], [address] AT [address] [place] [address] Phone: [phone] Fax: [phone]     Social Determinants of Health (SDOH) Interventions  No SDOH interventions requested or needed at discharge  Readmission Risk Interventions No flowsheet data found.

## 2020-07-21 DIAGNOSIS — F331 Major depressive disorder, recurrent, moderate: Secondary | ICD-10-CM | POA: Diagnosis not present

## 2020-07-21 DIAGNOSIS — D649 Anemia, unspecified: Secondary | ICD-10-CM | POA: Diagnosis not present

## 2020-07-21 DIAGNOSIS — G9009 Other idiopathic peripheral autonomic neuropathy: Secondary | ICD-10-CM | POA: Diagnosis not present

## 2020-07-21 DIAGNOSIS — G309 Alzheimer's disease, unspecified: Secondary | ICD-10-CM | POA: Diagnosis not present

## 2020-07-21 DIAGNOSIS — M726 Necrotizing fasciitis: Secondary | ICD-10-CM | POA: Diagnosis not present

## 2020-07-21 DIAGNOSIS — N390 Urinary tract infection, site not specified: Secondary | ICD-10-CM | POA: Diagnosis not present

## 2020-07-23 ENCOUNTER — Telehealth: Payer: Self-pay

## 2020-07-23 NOTE — Telephone Encounter (Signed)
Received call from [place] at [facility]. Patient had surgery on 07/16/2020 and now has mesh that is sutured into the scrotal area. However, the patient has been having bowel movements that have soiled the mesh and loosened it. Can the mesh be removed? He would like a call back to discuss options.  Office #: [phone] Olen Cordial direct #: [phone]

## 2020-07-24 DIAGNOSIS — L89610 Pressure ulcer of right heel, unstageable: Secondary | ICD-10-CM | POA: Diagnosis not present

## 2020-07-24 DIAGNOSIS — L89102 Pressure ulcer of unspecified part of back, stage 2: Secondary | ICD-10-CM | POA: Diagnosis not present

## 2020-07-24 DIAGNOSIS — I83015 Varicose veins of right lower extremity with ulcer other part of foot: Secondary | ICD-10-CM | POA: Diagnosis not present

## 2020-07-25 ENCOUNTER — Telehealth: Payer: Self-pay

## 2020-07-25 ENCOUNTER — Other Ambulatory Visit: Payer: Self-pay | Admitting: *Deleted

## 2020-07-25 NOTE — Patient Outreach (Signed)
Member screened for potential [facility] Care Management needs as a benefit of [employer] Medicare.  Per Patient Ping member resides in [facility].   Communication sent to [facility] SW to request update on transtion plans.   Will continue to follow while member resides in SNF.  Wayne Caddell, MSN-Ed, RN,BSN [facility] Post Acute Care Coordinator [phone] ( Business Mobile) [phone]  (Toll free office)  

## 2020-07-25 NOTE — Telephone Encounter (Signed)
Olen Cordial called to let us know that the mesh has come loose and the wound is really wide open.  Olen Cordial can see where the stitches were.  They will pack it the best they can, wrap with ADD pads and mesh underwear.  Patient does have a foley catheter in.  Olen Cordial would like to verify that they are still ok to use KY jelly and gauze.  Please call.

## 2020-07-26 DIAGNOSIS — N492 Inflammatory disorders of scrotum: Secondary | ICD-10-CM | POA: Diagnosis not present

## 2020-07-26 NOTE — Telephone Encounter (Signed)
Called and spoke with Wayne [name] at [facility] home on (07/23/20) and informed him per Dr. Claudia Desanctis do not remove the mesh and clean the area as best as they can.  [name] verbalized understanding and agreed.//AB/CMA

## 2020-07-26 NOTE — Telephone Encounter (Signed)
Called and spoke with Wayne [name] regarding the message below.  Informed him that I spoke with [facility] and he stated to apply Adaptic mesh in place prior to applying the KY jelly and gauze.  If no adaptic use non stick pad.    [name] verbalized understanding and agreed.//AB/CMA

## 2020-07-28 DIAGNOSIS — F32A Depression, unspecified: Secondary | ICD-10-CM | POA: Diagnosis not present

## 2020-07-28 DIAGNOSIS — E46 Unspecified protein-calorie malnutrition: Secondary | ICD-10-CM | POA: Diagnosis not present

## 2020-07-28 DIAGNOSIS — M726 Necrotizing fasciitis: Secondary | ICD-10-CM | POA: Diagnosis not present

## 2020-07-30 NOTE — Progress Notes (Signed)
   Subjective:     Patient ID: Wayne [name], male    DOB: [birth year], 86 y.o.   MRN: [id number]  Chief Complaint  Patient presents with  . Post-op Follow-up    HPI: The patient is a 86 y.o. male here for follow-up after undergoing debridement of scrotal wound with application of Primatrix AG mesh on 10/5 with Dr. Claudia Desanctis.  ~ 2 weeks  PO Patient reports he feels he is doing well today.  Denies fever/chills, nausea/vomiting, chest pain, shortness of breath, and pain.  There is no mesh visible at this time.  Suspect some Incorporated and some came off.  Wound area looks like good clean healthy tissue.  No signs of infection.  Moderate drainage present.  Review of Systems  Constitutional: Negative for chills and fever.  HENT: Negative for congestion and sore throat.   Respiratory: Negative for cough and shortness of breath.   Cardiovascular: Negative for chest pain.  Gastrointestinal: Negative for abdominal pain, nausea and vomiting.     Objective:   Vital Signs Pulse (!) 121   Temp 98.8 F (37.1 C) (Oral)   Ht 5\' 7"  (1.702 m)   Wt 121 lb (54.9 kg)   SpO2 96%   BMI 18.95 kg/m  Vital Signs and Nursing Note Reviewed  Physical Exam Constitutional:      General: He is not in acute distress.    Appearance: Normal appearance. He is underweight.  HENT:     Head: Normocephalic and atraumatic.  Eyes:     Extraocular Movements: Extraocular movements intact.  Pulmonary:     Effort: Pulmonary effort is normal.  Genitourinary:    Comments: No mesh visible at this time.  Wound has good healthy tissue visible.  No signs of infection.  Moderate drainage present. Skin:    General: Skin is warm and dry.     Coloration: Skin is not pale.     Findings: No rash.  Neurological:     Mental Status: He is alert and oriented to person, place, and time.     Gait: Gait is intact.  Psychiatric:        Mood and Affect: Mood and affect normal.        Behavior: Behavior normal. Behavior is  cooperative.        Thought Content: Thought content normal.        Cognition and Memory: Memory normal.        Judgment: Judgment normal.       Assessment/Plan:     ICD-10-CM   1. Scrotal abscess  N49.2    Patient is doing well overall today.  No Primatrix mesh is visible; suspect some Incorporated and some came off.  Wound has good healthy tissue will visible with no signs of infection.  Recommend wet-to-dry dressing changes daily.  Continue to keep area clean.  Continue with protein shakes/supplements.  No activity restrictions.  Follow-up in 3 to 4 weeks.  Call office with any questions/concerns.  Threasa Heads, PA-C 07/31/2020, 4:43 PM

## 2020-07-31 ENCOUNTER — Encounter: Payer: Self-pay | Admitting: Plastic Surgery

## 2020-07-31 ENCOUNTER — Other Ambulatory Visit: Payer: Self-pay

## 2020-07-31 ENCOUNTER — Ambulatory Visit (INDEPENDENT_AMBULATORY_CARE_PROVIDER_SITE_OTHER): Payer: Medicare Other | Admitting: Plastic Surgery

## 2020-07-31 VITALS — HR 121 | Temp 98.8°F | Ht 67.0 in | Wt 121.0 lb

## 2020-07-31 DIAGNOSIS — I83015 Varicose veins of right lower extremity with ulcer other part of foot: Secondary | ICD-10-CM | POA: Diagnosis not present

## 2020-07-31 DIAGNOSIS — N492 Inflammatory disorders of scrotum: Secondary | ICD-10-CM

## 2020-07-31 DIAGNOSIS — L89610 Pressure ulcer of right heel, unstageable: Secondary | ICD-10-CM | POA: Diagnosis not present

## 2020-07-31 DIAGNOSIS — L89102 Pressure ulcer of unspecified part of back, stage 2: Secondary | ICD-10-CM | POA: Diagnosis not present

## 2020-08-02 ENCOUNTER — Other Ambulatory Visit: Payer: Self-pay | Admitting: *Deleted

## 2020-08-02 NOTE — Patient Outreach (Signed)
[place] Coordinator follow up. Member screened for potential [facility] Care Management needs as a benefit of [place] Medicare.  Update received from [facility] SW indicating member will remain in LTC at [facility].   No identifiable [facility] Care Management needs at this time.    Marthenia Rolling, MSN-Ed, RN,BSN [place] Acute Care Coordinator [phone] [facility]) [phone]  (Toll free office)

## 2020-08-07 DIAGNOSIS — L89102 Pressure ulcer of unspecified part of back, stage 2: Secondary | ICD-10-CM | POA: Diagnosis not present

## 2020-08-07 DIAGNOSIS — L89610 Pressure ulcer of right heel, unstageable: Secondary | ICD-10-CM | POA: Diagnosis not present

## 2020-08-11 DIAGNOSIS — I70322 Atherosclerosis of unspecified type of bypass graft(s) of the extremities with rest pain, left leg: Secondary | ICD-10-CM | POA: Diagnosis not present

## 2020-08-11 DIAGNOSIS — E039 Hypothyroidism, unspecified: Secondary | ICD-10-CM | POA: Diagnosis not present

## 2020-08-11 DIAGNOSIS — E46 Unspecified protein-calorie malnutrition: Secondary | ICD-10-CM | POA: Diagnosis not present

## 2020-08-11 DIAGNOSIS — R059 Cough, unspecified: Secondary | ICD-10-CM | POA: Diagnosis not present

## 2020-08-12 DIAGNOSIS — D649 Anemia, unspecified: Secondary | ICD-10-CM | POA: Diagnosis not present

## 2020-08-12 DIAGNOSIS — R0602 Shortness of breath: Secondary | ICD-10-CM | POA: Diagnosis not present

## 2020-08-12 DIAGNOSIS — R059 Cough, unspecified: Secondary | ICD-10-CM | POA: Diagnosis not present

## 2020-08-12 DIAGNOSIS — K219 Gastro-esophageal reflux disease without esophagitis: Secondary | ICD-10-CM | POA: Diagnosis not present

## 2020-08-12 DIAGNOSIS — R1312 Dysphagia, oropharyngeal phase: Secondary | ICD-10-CM | POA: Diagnosis not present

## 2020-08-12 DIAGNOSIS — Z79899 Other long term (current) drug therapy: Secondary | ICD-10-CM | POA: Diagnosis not present

## 2020-08-12 DIAGNOSIS — I10 Essential (primary) hypertension: Secondary | ICD-10-CM | POA: Diagnosis not present

## 2020-08-14 DIAGNOSIS — L89102 Pressure ulcer of unspecified part of back, stage 2: Secondary | ICD-10-CM | POA: Diagnosis not present

## 2020-08-14 DIAGNOSIS — L89610 Pressure ulcer of right heel, unstageable: Secondary | ICD-10-CM | POA: Diagnosis not present

## 2020-08-21 ENCOUNTER — Other Ambulatory Visit: Payer: Self-pay

## 2020-08-21 ENCOUNTER — Encounter: Payer: Self-pay | Admitting: Plastic Surgery

## 2020-08-21 ENCOUNTER — Ambulatory Visit (INDEPENDENT_AMBULATORY_CARE_PROVIDER_SITE_OTHER): Payer: Medicare Other | Admitting: Plastic Surgery

## 2020-08-21 VITALS — BP 117/62 | HR 88 | Temp 98.5°F

## 2020-08-21 DIAGNOSIS — L89102 Pressure ulcer of unspecified part of back, stage 2: Secondary | ICD-10-CM | POA: Diagnosis not present

## 2020-08-21 DIAGNOSIS — L89610 Pressure ulcer of right heel, unstageable: Secondary | ICD-10-CM | POA: Diagnosis not present

## 2020-08-21 DIAGNOSIS — N492 Inflammatory disorders of scrotum: Secondary | ICD-10-CM

## 2020-08-21 NOTE — Progress Notes (Signed)
   Referring Provider Isaac Bliss, [place] Halsted, MD [place],  [place] [address]   CC:  Chief Complaint  Patient presents with  . Follow-up      Wayne [name] is an 86 y.o. male.  HPI: Patient is here with his son for follow-up of a scrotal wound.  He had a Fournier's gangrene was debrided.  I initially put on some pry matrix mesh to temporize this and he has been getting wet-to-dry wound care at his facility.  He feels like things are going fine and does not have any problems with the wound care.  Review of Systems General: Denies fevers or chills  Physical Exam Vitals with BMI 08/21/2020 07/31/2020 07/19/2020  Height - 5\' 7"  -  Weight - 121 lbs -  BMI - 60.15 -  Systolic 615 - 379  Diastolic 62 - 79  Pulse 88 121 72    General:  No acute distress,  Alert and oriented, Non-Toxic, Normal speech and affect On exam the wound looks to be granulating nicely.  It is contracting and a bit and has a smooth base with healthy granulation tissue.  Assessment/Plan Patient is doing well with wound care for his scrotal wound.  We discussed split-thickness skin graft versus continuing to do wound care as is being done.  His functional status is still limited quite a bit is not walking and getting up and around on his own very much at this time.  After spent discussing it with him and his son we are electing to continue with wound care at this time.  I explained it would take longer to heal this way but would avoid the need to undergo another surgical procedure for now.  We can always change her mind down the line if things change.  All their questions were answered and we will plan to see him again in about a month.  We will continue doing the wound care in the same way.  Cindra Presume 08/21/2020, 4:52 PM

## 2020-08-25 DIAGNOSIS — R059 Cough, unspecified: Secondary | ICD-10-CM | POA: Diagnosis not present

## 2020-08-25 DIAGNOSIS — M726 Necrotizing fasciitis: Secondary | ICD-10-CM | POA: Diagnosis not present

## 2020-08-25 DIAGNOSIS — I82401 Acute embolism and thrombosis of unspecified deep veins of right lower extremity: Secondary | ICD-10-CM | POA: Diagnosis not present

## 2020-08-25 DIAGNOSIS — K5900 Constipation, unspecified: Secondary | ICD-10-CM | POA: Diagnosis not present

## 2020-08-27 DIAGNOSIS — N35112 Postinfective bulbous urethral stricture, not elsewhere classified: Secondary | ICD-10-CM | POA: Diagnosis not present

## 2020-08-27 DIAGNOSIS — N401 Enlarged prostate with lower urinary tract symptoms: Secondary | ICD-10-CM | POA: Diagnosis not present

## 2020-08-27 DIAGNOSIS — N492 Inflammatory disorders of scrotum: Secondary | ICD-10-CM | POA: Diagnosis not present

## 2020-08-28 DIAGNOSIS — L89102 Pressure ulcer of unspecified part of back, stage 2: Secondary | ICD-10-CM | POA: Diagnosis not present

## 2020-08-28 DIAGNOSIS — L89610 Pressure ulcer of right heel, unstageable: Secondary | ICD-10-CM | POA: Diagnosis not present

## 2020-09-04 DIAGNOSIS — L89102 Pressure ulcer of unspecified part of back, stage 2: Secondary | ICD-10-CM | POA: Diagnosis not present

## 2020-09-04 DIAGNOSIS — L89610 Pressure ulcer of right heel, unstageable: Secondary | ICD-10-CM | POA: Diagnosis not present

## 2020-09-11 DIAGNOSIS — L89100 Pressure ulcer of unspecified part of back, unstageable: Secondary | ICD-10-CM | POA: Diagnosis not present

## 2020-09-12 DIAGNOSIS — R310 Gross hematuria: Secondary | ICD-10-CM | POA: Diagnosis not present

## 2020-09-13 DIAGNOSIS — I10 Essential (primary) hypertension: Secondary | ICD-10-CM | POA: Diagnosis not present

## 2020-09-18 DIAGNOSIS — L89100 Pressure ulcer of unspecified part of back, unstageable: Secondary | ICD-10-CM | POA: Diagnosis not present

## 2020-09-19 ENCOUNTER — Ambulatory Visit: Payer: Medicare Other | Admitting: Plastic Surgery

## 2020-09-23 DIAGNOSIS — F331 Major depressive disorder, recurrent, moderate: Secondary | ICD-10-CM | POA: Diagnosis not present

## 2020-09-23 DIAGNOSIS — R630 Anorexia: Secondary | ICD-10-CM | POA: Diagnosis not present

## 2020-09-23 DIAGNOSIS — F0390 Unspecified dementia without behavioral disturbance: Secondary | ICD-10-CM | POA: Diagnosis not present

## 2020-09-25 DIAGNOSIS — L89100 Pressure ulcer of unspecified part of back, unstageable: Secondary | ICD-10-CM | POA: Diagnosis not present

## 2020-09-26 ENCOUNTER — Ambulatory Visit: Payer: Medicare Other | Admitting: Plastic Surgery

## 2020-09-28 DIAGNOSIS — I10 Essential (primary) hypertension: Secondary | ICD-10-CM | POA: Diagnosis not present

## 2020-09-30 ENCOUNTER — Emergency Department (HOSPITAL_COMMUNITY): Payer: Medicare Other

## 2020-09-30 ENCOUNTER — Other Ambulatory Visit: Payer: Self-pay

## 2020-09-30 ENCOUNTER — Inpatient Hospital Stay (HOSPITAL_COMMUNITY)
Admission: EM | Admit: 2020-09-30 | Discharge: 2020-10-03 | DRG: 640 | Disposition: A | Discharge status: Skilled Nursing Facility | Payer: Medicare Other | Source: Skilled Nursing Facility | Attending: Internal Medicine | Admitting: Internal Medicine

## 2020-09-30 ENCOUNTER — Encounter (HOSPITAL_COMMUNITY): Payer: Self-pay

## 2020-09-30 DIAGNOSIS — Z66 Do not resuscitate: Secondary | ICD-10-CM | POA: Diagnosis present

## 2020-09-30 DIAGNOSIS — F0390 Unspecified dementia without behavioral disturbance: Secondary | ICD-10-CM | POA: Diagnosis present

## 2020-09-30 DIAGNOSIS — R413 Other amnesia: Secondary | ICD-10-CM | POA: Diagnosis not present

## 2020-09-30 DIAGNOSIS — M810 Age-related osteoporosis without current pathological fracture: Secondary | ICD-10-CM | POA: Diagnosis present

## 2020-09-30 DIAGNOSIS — Z7901 Long term (current) use of anticoagulants: Secondary | ICD-10-CM

## 2020-09-30 DIAGNOSIS — Z7401 Bed confinement status: Secondary | ICD-10-CM | POA: Diagnosis not present

## 2020-09-30 DIAGNOSIS — N400 Enlarged prostate without lower urinary tract symptoms: Secondary | ICD-10-CM | POA: Diagnosis present

## 2020-09-30 DIAGNOSIS — Z79899 Other long term (current) drug therapy: Secondary | ICD-10-CM | POA: Diagnosis not present

## 2020-09-30 DIAGNOSIS — E43 Unspecified severe protein-calorie malnutrition: Secondary | ICD-10-CM | POA: Diagnosis present

## 2020-09-30 DIAGNOSIS — E872 Acidosis: Secondary | ICD-10-CM | POA: Diagnosis present

## 2020-09-30 DIAGNOSIS — Z9889 Other specified postprocedural states: Secondary | ICD-10-CM | POA: Diagnosis not present

## 2020-09-30 DIAGNOSIS — Z20822 Contact with and (suspected) exposure to covid-19: Secondary | ICD-10-CM | POA: Diagnosis present

## 2020-09-30 DIAGNOSIS — G9341 Metabolic encephalopathy: Secondary | ICD-10-CM | POA: Diagnosis not present

## 2020-09-30 DIAGNOSIS — E860 Dehydration: Principal | ICD-10-CM | POA: Diagnosis present

## 2020-09-30 DIAGNOSIS — R404 Transient alteration of awareness: Secondary | ICD-10-CM | POA: Diagnosis not present

## 2020-09-30 DIAGNOSIS — K219 Gastro-esophageal reflux disease without esophagitis: Secondary | ICD-10-CM | POA: Diagnosis present

## 2020-09-30 DIAGNOSIS — E871 Hypo-osmolality and hyponatremia: Secondary | ICD-10-CM | POA: Diagnosis present

## 2020-09-30 DIAGNOSIS — R410 Disorientation, unspecified: Secondary | ICD-10-CM | POA: Diagnosis not present

## 2020-09-30 DIAGNOSIS — R627 Adult failure to thrive: Secondary | ICD-10-CM | POA: Diagnosis present

## 2020-09-30 DIAGNOSIS — Z79890 Hormone replacement therapy (postmenopausal): Secondary | ICD-10-CM

## 2020-09-30 DIAGNOSIS — E876 Hypokalemia: Secondary | ICD-10-CM | POA: Diagnosis present

## 2020-09-30 DIAGNOSIS — N179 Acute kidney failure, unspecified: Secondary | ICD-10-CM | POA: Diagnosis present

## 2020-09-30 DIAGNOSIS — L8990 Pressure ulcer of unspecified site, unspecified stage: Secondary | ICD-10-CM | POA: Diagnosis present

## 2020-09-30 DIAGNOSIS — I5032 Chronic diastolic (congestive) heart failure: Secondary | ICD-10-CM | POA: Diagnosis present

## 2020-09-30 DIAGNOSIS — E870 Hyperosmolality and hypernatremia: Secondary | ICD-10-CM | POA: Diagnosis present

## 2020-09-30 DIAGNOSIS — E039 Hypothyroidism, unspecified: Secondary | ICD-10-CM | POA: Diagnosis present

## 2020-09-30 DIAGNOSIS — R0689 Other abnormalities of breathing: Secondary | ICD-10-CM | POA: Diagnosis not present

## 2020-09-30 DIAGNOSIS — Z7189 Other specified counseling: Secondary | ICD-10-CM

## 2020-09-30 DIAGNOSIS — E785 Hyperlipidemia, unspecified: Secondary | ICD-10-CM | POA: Diagnosis present

## 2020-09-30 DIAGNOSIS — M2550 Pain in unspecified joint: Secondary | ICD-10-CM | POA: Diagnosis not present

## 2020-09-30 DIAGNOSIS — I110 Hypertensive heart disease with heart failure: Secondary | ICD-10-CM | POA: Diagnosis present

## 2020-09-30 DIAGNOSIS — L89619 Pressure ulcer of right heel, unspecified stage: Secondary | ICD-10-CM | POA: Diagnosis present

## 2020-09-30 DIAGNOSIS — R1310 Dysphagia, unspecified: Secondary | ICD-10-CM | POA: Diagnosis present

## 2020-09-30 DIAGNOSIS — N390 Urinary tract infection, site not specified: Secondary | ICD-10-CM | POA: Diagnosis not present

## 2020-09-30 DIAGNOSIS — R4182 Altered mental status, unspecified: Secondary | ICD-10-CM | POA: Diagnosis not present

## 2020-09-30 DIAGNOSIS — D638 Anemia in other chronic diseases classified elsewhere: Secondary | ICD-10-CM | POA: Diagnosis present

## 2020-09-30 DIAGNOSIS — R531 Weakness: Secondary | ICD-10-CM | POA: Diagnosis not present

## 2020-09-30 DIAGNOSIS — I700 Atherosclerosis of aorta: Secondary | ICD-10-CM | POA: Diagnosis not present

## 2020-09-30 DIAGNOSIS — I10 Essential (primary) hypertension: Secondary | ICD-10-CM | POA: Diagnosis not present

## 2020-09-30 DIAGNOSIS — R0902 Hypoxemia: Secondary | ICD-10-CM | POA: Diagnosis not present

## 2020-09-30 DIAGNOSIS — L89152 Pressure ulcer of sacral region, stage 2: Secondary | ICD-10-CM | POA: Diagnosis present

## 2020-09-30 DIAGNOSIS — R456 Violent behavior: Secondary | ICD-10-CM | POA: Diagnosis not present

## 2020-09-30 DIAGNOSIS — L89131 Pressure ulcer of right lower back, stage 1: Secondary | ICD-10-CM | POA: Diagnosis present

## 2020-09-30 LAB — URINALYSIS, ROUTINE W REFLEX MICROSCOPIC
Bilirubin Urine: NEGATIVE
Glucose, UA: NEGATIVE mg/dL
Ketones, ur: 5 mg/dL — AB
Nitrite: NEGATIVE
Protein, ur: 100 mg/dL — AB
RBC / HPF: >50 RBC/hpf — ABNORMAL HIGH (ref 0–5)
Specific Gravity, Urine: 1.015 (ref 1.005–1.030)
WBC, UA: >50 WBC/hpf — ABNORMAL HIGH (ref 0–5)
pH: 5 (ref 5.0–8.0)

## 2020-09-30 LAB — CBC WITH DIFFERENTIAL/PLATELET
Abs Immature Granulocytes: 0.16 10*3/uL — ABNORMAL HIGH (ref 0.00–0.07)
Basophils Absolute: 0 10*3/uL (ref 0.0–0.1)
Basophils Relative: 0 %
Eosinophils Absolute: 0.1 10*3/uL (ref 0.0–0.5)
Eosinophils Relative: 1 %
HCT: 33.2 % — ABNORMAL LOW (ref 39.0–52.0)
Hemoglobin: 10.6 g/dL — ABNORMAL LOW (ref 13.0–17.0)
Immature Granulocytes: 2 %
Lymphocytes Relative: 24 %
Lymphs Abs: 2.5 10*3/uL (ref 0.7–4.0)
MCH: 29.4 pg (ref 26.0–34.0)
MCHC: 31.9 g/dL (ref 30.0–36.0)
MCV: 92 fL (ref 80.0–100.0)
Monocytes Absolute: 0.6 10*3/uL (ref 0.1–1.0)
Monocytes Relative: 5 %
Neutro Abs: 7.3 10*3/uL (ref 1.7–7.7)
Neutrophils Relative %: 68 %
Platelets: 349 10*3/uL (ref 150–400)
RBC: 3.61 MIL/uL — ABNORMAL LOW (ref 4.22–5.81)
RDW: 15.3 % (ref 11.5–15.5)
WBC: 10.7 10*3/uL — ABNORMAL HIGH (ref 4.0–10.5)
nRBC: 0 % (ref 0.0–0.2)

## 2020-09-30 LAB — COMPREHENSIVE METABOLIC PANEL
ALT: 17 U/L (ref 0–44)
AST: 17 U/L (ref 15–41)
Albumin: 3.1 g/dL — ABNORMAL LOW (ref 3.5–5.0)
Alkaline Phosphatase: 100 U/L (ref 38–126)
Anion gap: 15 (ref 5–15)
BUN: 33 mg/dL — ABNORMAL HIGH (ref 8–23)
CO2: 19 mmol/L — ABNORMAL LOW (ref 22–32)
Calcium: 9.1 mg/dL (ref 8.9–10.3)
Chloride: 113 mmol/L — ABNORMAL HIGH (ref 98–111)
Creatinine, Ser: 1.34 mg/dL — ABNORMAL HIGH (ref 0.61–1.24)
GFR, Estimated: 50 mL/min — ABNORMAL LOW (ref >60)
Glucose, Bld: 76 mg/dL (ref 70–99)
Potassium: 4.1 mmol/L (ref 3.5–5.1)
Sodium: 147 mmol/L — ABNORMAL HIGH (ref 135–145)
Total Bilirubin: 0.8 mg/dL (ref 0.3–1.2)
Total Protein: 7.1 g/dL (ref 6.5–8.1)

## 2020-09-30 LAB — RESP PANEL BY RT-PCR (FLU A&B, COVID) ARPGX2
Influenza A by PCR: NEGATIVE
Influenza B by PCR: NEGATIVE
SARS Coronavirus 2 by RT PCR: NEGATIVE

## 2020-09-30 LAB — APTT: aPTT: 32 seconds (ref 24–36)

## 2020-09-30 LAB — PROTIME-INR
INR: 1.4 — ABNORMAL HIGH (ref 0.8–1.2)
Prothrombin Time: 16.8 seconds — ABNORMAL HIGH (ref 11.4–15.2)

## 2020-09-30 LAB — LACTIC ACID, PLASMA: Lactic Acid, Venous: 1.4 mmol/L (ref 0.5–1.9)

## 2020-09-30 MED ORDER — ONDANSETRON HCL 4 MG PO TABS: 4.0000 mg | ORAL_TABLET | Freq: Four times a day (QID) | ORAL | Status: DC | PRN

## 2020-09-30 MED ORDER — LEVOTHYROXINE SODIUM 50 MCG PO TABS
50.0000 ug | ORAL_TABLET | Freq: Every day | ORAL | Status: DC
  Administered 2020-10-01: 06:00:00 50 ug via ORAL
  Filled 2020-09-30: qty 1

## 2020-09-30 MED ORDER — HALOPERIDOL 5 MG PO TABS
2.5000 mg | ORAL_TABLET | Freq: Every day | ORAL | Status: DC
  Administered 2020-09-30 – 2020-10-02 (×3): 2.5 mg via ORAL
  Filled 2020-09-30: qty 5
  Filled 2020-09-30 (×2): qty 1

## 2020-09-30 MED ORDER — APIXABAN 2.5 MG PO TABS
2.5000 mg | ORAL_TABLET | Freq: Two times a day (BID) | ORAL | Status: DC
  Administered 2020-09-30 – 2020-10-02 (×4): 2.5 mg via ORAL
  Filled 2020-09-30 (×4): qty 1

## 2020-09-30 MED ORDER — POLYETHYLENE GLYCOL 3350 17 G PO PACK: 17.0000 g | PACK | Freq: Every day | ORAL | Status: DC | PRN

## 2020-09-30 MED ORDER — SODIUM CHLORIDE 0.9 % IV SOLN: INTRAVENOUS | Status: DC

## 2020-09-30 MED ORDER — DEXTROSE-NACL 5-0.45 % IV SOLN: INTRAVENOUS | Status: DC

## 2020-09-30 MED ORDER — ONDANSETRON HCL 4 MG/2ML IJ SOLN: 4.0000 mg | Freq: Four times a day (QID) | INTRAMUSCULAR | Status: DC | PRN

## 2020-09-30 MED ORDER — COLLAGENASE 250 UNIT/GM EX OINT
1.0000 "application " | TOPICAL_OINTMENT | Freq: Every day | CUTANEOUS | Status: DC
  Administered 2020-09-30 – 2020-10-03 (×4): 1 via TOPICAL
  Filled 2020-09-30: qty 30

## 2020-09-30 MED ORDER — POLYSACCHARIDE IRON COMPLEX 150 MG PO CAPS
150.0000 mg | ORAL_CAPSULE | Freq: Every day | ORAL | Status: DC
  Administered 2020-10-01 – 2020-10-03 (×3): 150 mg via ORAL
  Filled 2020-09-30 (×3): qty 1

## 2020-09-30 MED ORDER — ALBUTEROL SULFATE (2.5 MG/3ML) 0.083% IN NEBU
2.5000 mg | INHALATION_SOLUTION | Freq: Three times a day (TID) | RESPIRATORY_TRACT | Status: DC
  Administered 2020-09-30 – 2020-10-03 (×8): 2.5 mg via RESPIRATORY_TRACT
  Filled 2020-09-30 (×9): qty 3

## 2020-09-30 MED ORDER — SERTRALINE HCL 50 MG PO TABS
75.0000 mg | ORAL_TABLET | Freq: Every day | ORAL | Status: DC
  Administered 2020-09-30 – 2020-10-03 (×4): 75 mg via ORAL
  Filled 2020-09-30 (×5): qty 1

## 2020-09-30 MED ORDER — DERMACLOUD EX CREA: 1.0000 "application " | TOPICAL_CREAM | Freq: Three times a day (TID) | CUTANEOUS | Status: DC

## 2020-09-30 MED ORDER — SODIUM CHLORIDE 0.9% FLUSH
3.0000 mL | Freq: Two times a day (BID) | INTRAVENOUS | Status: DC
  Administered 2020-10-01 – 2020-10-03 (×3): 3 mL via INTRAVENOUS

## 2020-09-30 MED ORDER — ZINC OXIDE 40 % EX OINT
TOPICAL_OINTMENT | Freq: Three times a day (TID) | CUTANEOUS | Status: DC
  Filled 2020-09-30: qty 57

## 2020-09-30 MED ORDER — ACETAMINOPHEN 650 MG RE SUPP: 650.0000 mg | Freq: Four times a day (QID) | RECTAL | Status: DC | PRN

## 2020-09-30 MED ORDER — SENNOSIDES-DOCUSATE SODIUM 8.6-50 MG PO TABS: 1.0000 | ORAL_TABLET | Freq: Every evening | ORAL | Status: DC | PRN

## 2020-09-30 MED ORDER — SENNA 8.6 MG PO TABS
1.0000 | ORAL_TABLET | Freq: Every day | ORAL | Status: DC
  Administered 2020-09-30 – 2020-10-02 (×3): 8.6 mg via ORAL
  Filled 2020-09-30 (×3): qty 1

## 2020-09-30 MED ORDER — ACETAMINOPHEN 325 MG PO TABS: 650.0000 mg | ORAL_TABLET | Freq: Four times a day (QID) | ORAL | Status: DC | PRN

## 2020-09-30 MED ORDER — PRO-STAT SUGAR FREE PO LIQD
30.0000 mL | Freq: Two times a day (BID) | ORAL | Status: DC
  Administered 2020-09-30 – 2020-10-03 (×5): 30 mL via ORAL
  Filled 2020-09-30 (×6): qty 30

## 2020-09-30 MED ORDER — SODIUM CHLORIDE 0.9 % IV SOLN
2.0000 g | INTRAVENOUS | Status: DC
  Administered 2020-09-30 – 2020-10-02 (×3): 2 g via INTRAVENOUS
  Filled 2020-09-30 (×4): qty 20

## 2020-09-30 MED ORDER — HYDRALAZINE HCL 20 MG/ML IJ SOLN: 5.0000 mg | Freq: Four times a day (QID) | INTRAMUSCULAR | Status: DC | PRN

## 2020-09-30 MED ORDER — TRAMADOL HCL 50 MG PO TABS
50.0000 mg | ORAL_TABLET | Freq: Three times a day (TID) | ORAL | Status: DC | PRN
  Administered 2020-09-30 – 2020-10-02 (×4): 50 mg via ORAL
  Filled 2020-09-30 (×4): qty 1

## 2020-09-30 NOTE — ED Provider Notes (Signed)
[facility] DEPT Provider Note   CSN: [id number] Arrival date & time: 09/30/20  1512     History Chief Complaint  Patient presents with  . Code Sepsis    Wayne [name] is a 86 y.o. male.  86 year old male presents from nursing home with altered mental status.  Patient diagnosed with UTI this week and does at times become more combative and altered.  No reported cough or congestion.  Patient does have some baseline dementia.  No reported emesis.  Presents via EMS.  History is limited due to his current state        Past Medical History:  Diagnosis Date  . Anxiety    takes Atrivan daily  . Back pain    chronic with neck pain  . Cancer ([place])    skin  . CHF (congestive heart failure) ([place])   . Constipation    related to medication  . Dementia ([place])    takes Aricept nightly  . Enlarged prostate    self caths once every 1-2wks  . GERD (gastroesophageal reflux disease)    doesn't require medication  . Hx of seasonal allergies   . Hyperlipidemia    takes Simvastatin daily  . Hypertension    takes atenolol daily  . Hypothyroidism    takes Synthroid daily  . Insomnia    related to pain  . MRSA (methicillin resistant staph aureus) culture positive    Per patient tested on 10/07/11.  . Osteoporosis   . Peripheral edema    takes Torsemide daily  . Scoliosis     Patient Active Problem List   Diagnosis Date Noted  . Scrotal infection   . DNR (do not resuscitate)   . DNI (do not intubate)   . Goals of care, counseling/discussion   . Palliative care by specialist   . Pressure injury of skin 07/09/2020  . Cellulitis of scrotum 07/08/2020  . Dementia ([place])   . Stercoral colitis   . Obstructive uropathy   . Bilateral hydronephrosis   . Sepsis due to undetermined organism ([place])   . AKI (acute kidney injury) ([place])   . Acute UTI (urinary tract infection)   . Hyperkalemia   . Hip fracture, unspecified laterality, closed, initial  encounter ([place]) 10/11/2018  . Hip fracture ([place]) 10/11/2018  . Closed intertrochanteric fracture of left femur ([place]) 10/11/2018  . Viral URI with cough 10/20/2017  . Pneumonia of left lower lobe due to infectious organism 10/20/2017  . Renal insufficiency 01/25/2012  . Insomnia 12/21/2011  . Venous insufficiency 08/24/2011  . ALLERGIC RHINITIS 12/10/2008  . DERMATITIS, CONTACT, NOS 08/11/2007  . Short-term memory loss 08/02/2007  . Hypothyroidism 04/07/2007  . Hyperlipidemia 04/07/2007  . Essential hypertension 04/07/2007    Past Surgical History:  Procedure Laterality Date  . abdominal cyst     removed-2012;MRSA done by Dr.Gross  . APPLICATION OF A-CELL OF EXTREMITY N/A 07/16/2020   Procedure: placement of primatrix ag mesh;  Surgeon: Cindra Presume, MD;  Location: [facility];  Service: Plastics;  Laterality: N/A;  . CATARACT EXTRACTION    . cataracts     bilateral  . CERVICAL DISC SURGERY     x1  . COLONOSCOPY    . ESOPHAGOGASTRODUODENOSCOPY    . HERNIA REPAIR     Right inguinal  . INCISION AND DRAINAGE OF WOUND N/A 07/09/2020   Procedure: IRRIGATION AND DEBRIDEMENT SCROTUM AND PERINEUM;  Surgeon: Rolm Bookbinder, MD;  Location: [facility];  Service: General;  Laterality:  N/A;  . INCISION AND DRAINAGE OF WOUND N/A 07/09/2020   Procedure: DEBRIDEMENT OF SCROTUM;  Surgeon: Robley Fries, MD;  Location: [facility];  Service: Urology;  Laterality: N/A;  . INCISION AND DRAINAGE OF WOUND N/A 07/16/2020   Procedure: debridement of scrotal wound;  Surgeon: Cindra Presume, MD;  Location: [facility];  Service: Plastics;  Laterality: N/A;  . INTRAMEDULLARY (IM) NAIL INTERTROCHANTERIC Left 10/12/2018  . INTRAMEDULLARY (IM) NAIL INTERTROCHANTERIC Left 10/12/2018   Procedure: INTRAMEDULLARY (IM) NAIL INTERTROCHANTRIC;  Surgeon: Renette Butters, MD;  Location: [facility];  Service: Orthopedics;  Laterality: Left;  . LUMBAR DISC SURGERY     x2  . TOE SURGERY     right great toe  . TRANSURETHRAL RESECTION  OF PROSTATE     15+yrs ago       Family History  Problem Relation Age of Onset  . Heart disease Mother 27       Vague history  . Stroke Father   . Hypertension Other        Multiple family members both sides  . Diabetes Other   . Anesthesia problems Neg Hx   . Hypotension Neg Hx   . Malignant hyperthermia Neg Hx   . Pseudochol deficiency Neg Hx     Social History   Tobacco Use  . Smoking status: Never Smoker  . Smokeless tobacco: Never Used  Vaping Use  . Vaping Use: Never used  Substance Use Topics  . Alcohol use: No  . Drug use: No    Home Medications Prior to Admission medications   Medication Sig Start Date End Date Taking? Authorizing Provider  acetaminophen (TYLENOL) 325 MG tablet Take 650 mg by mouth in the morning, at noon, and at bedtime.    [provider]  ferrous sulfate 325 (65 FE) MG tablet Take 1 tablet (325 mg total) by mouth daily with breakfast. 07/20/20   Regalado, Cassie Freer, MD  Infant Care Products (DERMACLOUD) CREA Apply 1 application topically in the morning, at noon, and at bedtime.    [provider]  levothyroxine (SYNTHROID) 25 MCG tablet Take 50 mcg by mouth daily before breakfast.  06/29/20   [provider]  liver oil-zinc oxide (DESITIN) 40 % ointment Apply topically 3 (three) times daily. 07/19/20   Regalado, Belkys A, MD  magic mouthwash SOLN Take 5 mLs by mouth in the morning and at bedtime.    [provider]  mirtazapine (REMERON) 15 MG tablet Take 15 mg by mouth at bedtime. 06/11/20   [provider]  Pollen Extracts (PROSTAT PO) Take 30 mLs by mouth in the morning and at bedtime.    [provider]  polyethylene glycol (MIRALAX / GLYCOLAX) packet Take 17 g by mouth daily as needed for mild constipation. 10/15/18   Shelly Coss, MD  senna-docusate (SENOKOT-S) 8.6-50 MG tablet Take 1 tablet by mouth at bedtime as needed for mild constipation. 10/15/18   Shelly Coss, MD  sertraline  (ZOLOFT) 25 MG tablet Take 75 mg by mouth daily.  06/25/20   [provider]  simvastatin (ZOCOR) 40 MG tablet TAKE 1 TABLET BY MOUTH EVERY DAY AT NIGHT 06/08/19   Isaac Bliss, Rayford Halsted, MD  traMADol (ULTRAM) 50 MG tablet Take 1 tablet (50 mg total) by mouth 3 (three) times daily as needed for moderate pain or severe pain. 07/19/20   Regalado, Jerald Kief A, MD    Allergies    Doxazosin mesylate, Propoxyphene hcl, and Penicillins  Review  of Systems   Review of Systems  Unable to perform ROS: Dementia    Physical Exam Updated Vital Signs BP 134/82 (BP Location: Right Arm)   Pulse 84   Temp 97.7 F (36.5 C) (Rectal)   Resp 18   SpO2 99%   Physical Exam Vitals and nursing note reviewed.  Constitutional:      General: He is not in acute distress.    Appearance: Normal appearance. He is well-developed and well-nourished. He is not toxic-appearing.  HENT:     Head: Normocephalic and atraumatic.  Eyes:     General: Lids are normal.     Extraocular Movements: EOM normal.     Conjunctiva/sclera: Conjunctivae normal.     Right eye: Exudate present.     Left eye: Exudate present.     Pupils: Pupils are equal, round, and reactive to light.     Comments: Attempted to pry patient's eyes open without success  Neck:     Thyroid: No thyroid mass.     Trachea: No tracheal deviation.  Cardiovascular:     Rate and Rhythm: Normal rate and regular rhythm.     Heart sounds: Normal heart sounds. No murmur heard. No gallop.   Pulmonary:     Effort: Pulmonary effort is normal. No respiratory distress.     Breath sounds: Normal breath sounds. No stridor. No decreased breath sounds, wheezing, rhonchi or rales.  Abdominal:     General: Bowel sounds are normal. There is no distension.     Palpations: Abdomen is soft.     Tenderness: There is no abdominal tenderness. There is no CVA tenderness or rebound.  Musculoskeletal:        General: No tenderness or edema. Normal range of motion.      Cervical back: Normal range of motion and neck supple.  Skin:    General: Skin is warm and dry.     Findings: No abrasion or rash.  Neurological:     Mental Status: He is lethargic and disoriented.     GCS: GCS eye subscore is 3. GCS verbal subscore is 5. GCS motor subscore is 6.     Cranial Nerves: No cranial nerve deficit.     Sensory: No sensory deficit.     Deep Tendon Reflexes: Strength normal.     Comments: Patient moves all 4 extremities to pain.  Uncooperative with exam  Psychiatric:        Attention and Perception: He is inattentive.        Mood and Affect: Affect is angry.        Speech: Speech is delayed.        Behavior: Behavior is agitated.     ED Results / Procedures / Treatments   Labs (all labs ordered are listed, but only abnormal results are displayed) Labs Reviewed  CULTURE, BLOOD (ROUTINE X 2)  CULTURE, BLOOD (ROUTINE X 2)  URINE CULTURE  RESP PANEL BY RT-PCR (FLU A&B, COVID) ARPGX2  LACTIC ACID, PLASMA  LACTIC ACID, PLASMA  COMPREHENSIVE METABOLIC PANEL  CBC WITH DIFFERENTIAL/PLATELET  PROTIME-INR  APTT  URINALYSIS, ROUTINE W REFLEX MICROSCOPIC    EKG None  Radiology No results found.  Procedures Procedures (including critical care time)  Medications Ordered in ED Medications  0.9 %  sodium chloride infusion (has no administration in time range)    ED Course  I have reviewed the triage vital signs and the nursing notes.  Pertinent labs & imaging results that were available during my  care of the patient were reviewed by me and considered in my medical decision making (see chart for details).    MDM Rules/Calculators/A&P                          Urinalysis positive for infection here.  Patient started on IV Rocephin here.  Does have evidence of dehydration.  Will admit to the hospitalist service Final Clinical Impression(s) / ED Diagnoses Final diagnoses:  None    Rx / DC Orders ED Discharge Orders    None       Lacretia Leigh, MD 09/30/20 1732

## 2020-09-30 NOTE — ED Triage Notes (Signed)
Pt BIB EMS. Pt coming from [facility]. Pt was found to have UTI this weekend. Per SNF staff pt became confused on Friday and combative on today. Per EMS pt meets sepsis criteria.   BP-96/70 CBG-97 RR-30 O2-94% RA.

## 2020-09-30 NOTE — Progress Notes (Signed)
Code Sepsis monitoring discontinued due to cancellation.   Message sent to bedside RN and MD, no abx ordered, asked MD if he wanted to continue sepsis protocol, he replied No. I then asked for sepsis order to be DC. Elink will stop tracking sepsis at this time.

## 2020-09-30 NOTE — H&P (Signed)
History and Physical    Wayne [name] HQI:[id number] DOB: [birth year] DOA: 09/30/2020  PCP: Patient, No Pcp Per  Patient coming from: [place].  I have personally briefly reviewed patient's old medical records in [place]  Chief Complaint: Confusion/recent UTI  HPI: Wayne [name] is a 86 y.o. 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  It was noted was diagnosed with a UTI 3 days prior to admission. Patient is a poor historian due to history of dementia/cognitive deficits and as such history obtained per ED physician and review of epic chart. Patient laying on gurney eyes closed, asking to be left alone.  Patient answer some questions and does not answer others.  Denies any chest pain, no shortness of breath, no abdominal pain, no nausea, no vomiting, no diarrhea, no constipation, no melena, no hematemesis, no hematochezia, no syncope, no dizziness, no lightheadedness.  ED Course: Patient seen in the ED, comprehensive metabolic panel obtained with a sodium of 147, bicarb of 19, BUN of 33, creatinine of 1.34, albumin of 3.1 otherwise was within normal limits.  Lactic acid level of 1.4.  CBC with a white count of 10.7, hemoglobin of 10.6 otherwise was within normal limits.  0.4.  SARS coronavirus 2 PCR negative.  Influenza A and B negative.  Urinalysis cloudy, large leukocytes, nitrite negative, many bacteria, greater than 50 WBCs, greater than 50 RBCs.  Chest x-ray  negative for any acute infiltrate.  EKG with normal sinus rhythm with atypical right bundle branch block.  Patient received a dose of IV Rocephin in the ED.  Place on IV fluids.  Hospitalist were called to admit the patient for further evaluation and management.  Review of Systems: As per HPI otherwise all other systems reviewed and are negative.  Past Medical History:  Diagnosis Date  . Anxiety    takes Atrivan daily  . Back pain    chronic with neck pain  . Cancer ([place])    skin  . CHF (congestive heart failure) ([place])   . Constipation    related to medication  . Dementia ([name])    takes Aricept nightly  . Enlarged prostate    self caths once every 1-2wks  . GERD (gastroesophageal reflux disease)    doesn't require medication  . Hx of seasonal allergies   . Hyperlipidemia    takes Simvastatin daily  . Hypertension    takes atenolol daily  . Hypothyroidism    takes Synthroid daily  . Insomnia    related to pain  . MRSA (methicillin resistant staph aureus) culture positive    Per patient tested on 10/07/11.  . Osteoporosis   . Peripheral edema    takes Torsemide daily  . Scoliosis     Past Surgical History:  Procedure Laterality Date  . abdominal cyst     removed-2012;MRSA done by Dr.Gross  . APPLICATION OF A-CELL OF EXTREMITY N/A 07/16/2020   Procedure: placement of primatrix ag mesh;  Surgeon: Cindra Presume, MD;  Location: [facility];  Service: Plastics;  Laterality: N/A;  . CATARACT EXTRACTION    .  cataracts     bilateral  . CERVICAL DISC SURGERY     x1  . COLONOSCOPY    . ESOPHAGOGASTRODUODENOSCOPY    . HERNIA REPAIR     Right inguinal  . INCISION AND DRAINAGE OF WOUND N/A 07/09/2020   Procedure: IRRIGATION AND DEBRIDEMENT SCROTUM AND PERINEUM;  Surgeon: Rolm Bookbinder, MD;  Location: [facility];  Service: General;  Laterality: N/A;  . INCISION AND DRAINAGE OF WOUND N/A 07/09/2020   Procedure: DEBRIDEMENT OF SCROTUM;  Surgeon: Robley Fries, MD;  Location: [facility];   Service: Urology;  Laterality: N/A;  . INCISION AND DRAINAGE OF WOUND N/A 07/16/2020   Procedure: debridement of scrotal wound;  Surgeon: Cindra Presume, MD;  Location: [facility];  Service: Plastics;  Laterality: N/A;  . INTRAMEDULLARY (IM) NAIL INTERTROCHANTERIC Left 10/12/2018  . INTRAMEDULLARY (IM) NAIL INTERTROCHANTERIC Left 10/12/2018   Procedure: INTRAMEDULLARY (IM) NAIL INTERTROCHANTRIC;  Surgeon: Renette Butters, MD;  Location: [facility];  Service: Orthopedics;  Laterality: Left;  . LUMBAR DISC SURGERY     x2  . TOE SURGERY     right great toe  . TRANSURETHRAL RESECTION OF PROSTATE     15+yrs ago    Social History  reports that he has never smoked. He has never used smokeless tobacco. He reports that he does not drink alcohol and does not use drugs.  Allergies  Allergen Reactions  . Doxazosin Mesylate Other (See Comments)    Makes blood pressure  . Propoxyphene Hcl Other (See Comments)    REACTION: upset stomach  . Penicillins Rash    Tolerates cephalosporins including cefazolin  DID THE REACTION INVOLVE: Swelling of the face/tongue/throat, SOB, or low BP? No Sudden or severe rash/hives, skin peeling, or the inside of the mouth or nose? Yes Did it require medical treatment? Yes When did it last happen?50 yrs ago If all above answers are "NO", may proceed with cephalosporin use.     Family History  Problem Relation Age of Onset  . Heart disease Mother 40       Vague history  . Stroke Father   . Hypertension Other        Multiple family members both sides  . Diabetes Other   . Anesthesia problems Neg Hx   . Hypotension Neg Hx   . Malignant hyperthermia Neg Hx   . Pseudochol deficiency Neg Hx    As reviewed in epic.  Patient with dementia.  Patient poor historian.  Prior to Admission medications   Medication Sig Start Date End Date Taking? Authorizing Provider  Albuterol Sulfate 2.5 MG/0.5ML NEBU Inhale 2.5 mg into the lungs 3 (three) times daily.   Yes [provider]  Amino Acids-Protein Hydrolys (FEEDING SUPPLEMENT, PRO-STAT SUGAR FREE 64,) LIQD Take 30 mLs by mouth in the morning and at bedtime.   Yes [provider]  apixaban (ELIQUIS) 2.5 MG TABS tablet Take 2.5 mg by mouth 2 (two) times daily.   Yes [provider]  Ascorbic Acid (VITAMIN C) 1000 MG tablet Take 1,000 mg by mouth in the morning and at bedtime.   Yes [provider]  collagenase (SANTYL) ointment Apply 1 application topically daily.   Yes [provider]  ferrous sulfate 325 (65 FE) MG tablet Take 1 tablet (325 mg total) by mouth daily with breakfast. 07/20/20  Yes Regalado, Belkys A, MD  haloperidol (HALDOL) 5 MG tablet Take 2.5 mg by mouth at bedtime.   Yes [provider]  haloperidol  lactate (HALDOL) 5 MG/ML injection Inject 2 mg into the muscle once.   Yes [provider]  HYDROcodone-acetaminophen (NORCO/VICODIN) 5-325 MG tablet Take 1 tablet by mouth 2 (two) times daily.   Yes [provider]  Infant Care Products (DERMACLOUD) CREA Apply 1 application topically in the morning, at noon, and at bedtime. To buttocks   Yes [provider]  iron polysaccharides (NIFEREX) 150 MG capsule Take 150 mg by mouth daily.   Yes [provider]  lactose free nutrition (BOOST PLUS) LIQD Take 237 mLs by mouth 3 (three) times daily with meals.   Yes [provider]  levothyroxine (SYNTHROID) 50 MCG tablet Take 50 mcg by mouth daily before breakfast.   Yes [provider]  mirtazapine (REMERON) 15 MG tablet Take 15 mg by mouth at bedtime. 06/11/20  Yes [provider]  NON FORMULARY Inject 1 each into the skin in the morning, at noon, and at bedtime. Clysis NS @60  ml /hr for 7 days for dehydration   Yes [provider]  polyethylene glycol (MIRALAX / GLYCOLAX) packet Take 17 g by mouth daily as needed for mild constipation. Patient taking differently: Take 17 g by mouth daily.  10/15/18  Yes Shelly Coss, MD  promethazine (PHENERGAN) 25 MG tablet Take 25 mg by mouth 2 (two) times daily as needed for nausea or vomiting.   Yes [provider]  senna-docusate (SENOKOT-S) 8.6-50 MG tablet Take 1 tablet by mouth at bedtime as needed for mild constipation. Patient taking differently: Take 1 tablet by mouth daily. 10/15/18  Yes Shelly Coss, MD  sertraline (ZOLOFT) 25 MG tablet Take 75 mg by mouth daily.  06/25/20  Yes [provider]  simvastatin (ZOCOR) 40 MG tablet TAKE 1 TABLET BY MOUTH EVERY DAY AT NIGHT Patient taking differently: Take 40 mg by mouth daily. 06/08/19  Yes Isaac Bliss, Rayford Halsted, MD  sulfamethoxazole-trimethoprim (BACTRIM DS) 800-160 MG tablet Take 1 tablet by mouth 2 (two) times daily.   Yes [provider]  traMADol (ULTRAM) 50 MG tablet Take 1 tablet (50 mg total) by mouth 3 (three) times daily as needed for moderate pain or severe pain. 07/19/20  Yes Regalado, Belkys A, MD  Zinc Sulfate 220 (50 Zn) MG TABS Take 220 mg by mouth daily.   Yes [provider]  liver oil-zinc oxide (DESITIN) 40 % ointment Apply topically 3 (three) times daily. Patient not taking: Reported on 09/30/2020 07/19/20   Elmarie Shiley, MD    Physical Exam: Vitals:   09/30/20 1559 09/30/20 1630 09/30/20 1715 09/30/20 1800  BP:  (!) 114/59 108/62 (!) 131/97  Pulse:  72 71 86  Resp:  16 16 20   Temp:      TempSrc:      SpO2:  98% 98% 97%  Weight: 54.4 kg     Height: 5\' 8"  (1.727 m)       Constitutional: NAD, cachectic.  Emaciated.  Frail.  Extremely dry mucous membranes Vitals:   09/30/20 1559 09/30/20 1630 09/30/20 1715 09/30/20 1800  BP:  (!) 114/59 108/62 (!) 131/97  Pulse:  72 71 86  Resp:  16 16 20   Temp:      TempSrc:      SpO2:  98% 98% 97%  Weight: 54.4 kg     Height: 5\' 8"  (1.727 m)      Eyes: Patient refusing to open eyes.  Eyes matted.  ENMT: Mucous membranes are extremely dry.  Oropharynx dry with poor  dentition.  Neck: normal, supple, no masses, no thyromegaly Respiratory: clear to auscultation bilaterally anterior lung fields, no wheezing, no crackles. Normal respiratory effort. No accessory muscle use.  Cardiovascular: Regular rate and rhythm, no murmurs / rubs / gallops. No extremity edema. 2+ pedal pulses. No carotid bruits.  Abdomen: no tenderness, no masses palpated. No hepatosplenomegaly. Bowel sounds positive.  Musculoskeletal: no clubbing / cyanosis. No joint deformity upper and lower extremities. Good ROM, no contractures. Normal muscle tone.  Skin: Scrotal area with some granulation tissue, smooth base, moist.  No induration or ulcers.  Neurologic: Unable to fully assess due to patient's mental status.  Moving extremities spontaneously.  Sensation seems intact.  Unable to elicit reflexes.  Unable to assess strength. Psychiatric: Normal judgment and insight. Alert and oriented x 3. Normal mood.   Labs on Admission: I have personally reviewed following labs and imaging studies  CBC: Recent Labs  Lab 09/30/20 1550  WBC 10.7*  NEUTROABS 7.3  HGB 10.6*  HCT 33.2*  MCV 92.0  PLT 401    Basic Metabolic Panel: Recent Labs  Lab 09/30/20 1550  NA 147*  K 4.1  CL 113*  CO2 19*  GLUCOSE 76  BUN 33*  CREATININE 1.34*  CALCIUM 9.1    GFR: Estimated Creatinine Clearance: 27.1 mL/min (A) (by C-G formula based on SCr of 1.34 mg/dL (H)).  Liver Function Tests: Recent Labs  Lab 09/30/20 1550  AST 17  ALT 17  ALKPHOS 100  BILITOT 0.8  PROT 7.1  ALBUMIN 3.1*    Urine analysis:    Component Value Date/Time   COLORURINE YELLOW 09/30/2020 1602   APPEARANCEUR CLOUDY (A) 09/30/2020 1602   LABSPEC 1.015 09/30/2020 1602   PHURINE 5.0 09/30/2020 1602   GLUCOSEU NEGATIVE 09/30/2020 1602   HGBUR LARGE (A) 09/30/2020 1602   BILIRUBINUR NEGATIVE 09/30/2020 1602   BILIRUBINUR n 11/11/2015 1716   KETONESUR 5 (A) 09/30/2020 1602   PROTEINUR 100 (A) 09/30/2020 1602    UROBILINOGEN 0.2 11/11/2015 1716   UROBILINOGEN 0.2 02/15/2012 1335   NITRITE NEGATIVE 09/30/2020 1602   LEUKOCYTESUR LARGE (A) 09/30/2020 1602    Radiological Exams on Admission: DG Chest Port 1 View  Result Date: 09/30/2020 CLINICAL DATA:  Confusion/altered mental status EXAM: PORTABLE CHEST 1 VIEW COMPARISON:  July 08, 2020 FINDINGS: There is no edema or airspace opacity. Heart is mildly enlarged with pulmonary vascularity normal. No adenopathy. Stimulator leads in lower thoracic region, unchanged. Postoperative change in lower cervical spine. There is aortic atherosclerosis. No bone lesions. IMPRESSION: No edema or airspace opacity. Stable cardiac prominence postoperative changes noted. Aortic Atherosclerosis (ICD10-I70.0). Electronically Signed   By: Lowella Grip III M.D.   On: 09/30/2020 16:51    EKG: Independently reviewed.  Sinus rhythm with atypical right bundle branch block.  Assessment/Plan Principal Problem:   Acute metabolic encephalopathy Active Problems:   Acute UTI (urinary tract infection)   Hypothyroidism   Hyperlipidemia   Short-term memory loss   Essential hypertension   Dementia (HCC)   AKI (acute kidney injury) ([place])   Pressure injury of skin   DNR (do not resuscitate)   Dehydration   Hypernatremia   1 acute metabolic encephalopathy Likely secondary to acute UTI in the setting of significant dehydration.  Patient noted 3 to 4 days prior to admission to be diagnosed with a UTI.  Patient with worsening confusion and increased combativeness and subsequently presented to the ED.  Work-up consistent with a UTI.  Urine cultures pending.  Blood cultures  pending.  Chest x-ray negative.  Continue empiric IV Rocephin, IV fluids, supportive care.  2.  UTI Urine cultures pending.  IV Rocephin.  3.  Hypernatremia Likely secondary to severe dehydration/volume depletion.  Patient clinically dry on examination.  Patient on IV fluids.  Change IV fluids to D5  half-normal saline.  Follow.  4.  Dehydration IV fluids.  5.  Hypothyroidism Resume home regimen Synthroid.  6.  Dementia/cognitive deficits Resume home regimen Zoloft, Haldol.  7.  Severe protein calorie malnutrition/failure to thrive Noted during prior hospitalization.  Per discharge summary patient was to follow with palliative care in the outpatient setting.  Patient currently DNR.  Nutritional supplementation.  We will have palliative care reassess for goals of care.  Follow.  8.  Recent perineal necrotizing fasciitis status post extensive debridement Being followed in the outpatient setting by plastics.  Per epic patient last seen by plastic surgery November 2021 at which point in time patient was undergoing wet-to-dry dressing changes which we will continue for now.  Will need to discuss with general surgery versus urology for further recommendations for dressing changes during this hospitalization.  9.  Dysphagia Patient noted to have been discharged on a dysphagia 2 diet with nectar thick liquids during prior hospitalization.  Keep n.p.o. for now until more alert.  Once alert could place back on a dysphagia 2 diet with nectar thick liquids pending SLP evaluation.  10.  Acute kidney injury Likely secondary to prerenal azotemia secondary to dehydration.  IV fluids.  Follow.  I and O cath as needed.  11.  Hypothyroidism Resume home regimen Synthroid.   DVT prophylaxis: Eliquis Code Status:   DNR Family Communication:  No family at bedside Disposition Plan:   Patient is from:  Skilled nursing facility  Anticipated DC to:  Back to skilled nursing facility  Anticipated DC date:  3 to 4 days per  Anticipated DC barriers: Clinical improvement.  Consults called: None Admission status:  Inpatient     Irine Seal MD [facility]  How to contact the [facility] Attending or Consulting provider [place] or covering provider during after hours [place], for this patient?    1. Check the care team in [facility] and look for a) attending/consulting [facility] provider listed and b) the [facility] team listed 2. Log into [email or web address] and use [place]'s universal password to access. If you do not have the password, please contact the hospital operator. 3. Locate the [facility] provider you are looking for under [facility] and page to a number that you can be directly reached. 4. If you still have difficulty reaching the provider, please page the [facility] (Director on Call) for the Hospitalists listed on amion for assistance.  09/30/2020, 6:43 PM

## 2020-09-30 NOTE — Progress Notes (Signed)
elink tracking sepsis

## 2020-10-01 LAB — CBC
HCT: 27.9 % — ABNORMAL LOW (ref 39.0–52.0)
Hemoglobin: 8.8 g/dL — ABNORMAL LOW (ref 13.0–17.0)
MCH: 29.5 pg (ref 26.0–34.0)
MCHC: 31.5 g/dL (ref 30.0–36.0)
MCV: 93.6 fL (ref 80.0–100.0)
Platelets: 240 10*3/uL (ref 150–400)
RBC: 2.98 MIL/uL — ABNORMAL LOW (ref 4.22–5.81)
RDW: 15.4 % (ref 11.5–15.5)
WBC: 5.6 10*3/uL (ref 4.0–10.5)
nRBC: 0 % (ref 0.0–0.2)

## 2020-10-01 LAB — COMPREHENSIVE METABOLIC PANEL
ALT: 13 U/L (ref 0–44)
AST: 12 U/L — ABNORMAL LOW (ref 15–41)
Albumin: 2.5 g/dL — ABNORMAL LOW (ref 3.5–5.0)
Alkaline Phosphatase: 74 U/L (ref 38–126)
Anion gap: 10 (ref 5–15)
BUN: 29 mg/dL — ABNORMAL HIGH (ref 8–23)
CO2: 19 mmol/L — ABNORMAL LOW (ref 22–32)
Calcium: 8.3 mg/dL — ABNORMAL LOW (ref 8.9–10.3)
Chloride: 115 mmol/L — ABNORMAL HIGH (ref 98–111)
Creatinine, Ser: 1.05 mg/dL (ref 0.61–1.24)
GFR, Estimated: >60 mL/min (ref >60)
Glucose, Bld: 102 mg/dL — ABNORMAL HIGH (ref 70–99)
Potassium: 3.3 mmol/L — ABNORMAL LOW (ref 3.5–5.1)
Sodium: 144 mmol/L (ref 135–145)
Total Bilirubin: 0.2 mg/dL — ABNORMAL LOW (ref 0.3–1.2)
Total Protein: 5.6 g/dL — ABNORMAL LOW (ref 6.5–8.1)

## 2020-10-01 LAB — TSH: TSH: 20.167 u[IU]/mL — ABNORMAL HIGH (ref 0.350–4.500)

## 2020-10-01 LAB — URINE CULTURE

## 2020-10-01 LAB — PHOSPHORUS: Phosphorus: 3.1 mg/dL (ref 2.5–4.6)

## 2020-10-01 LAB — MAGNESIUM: Magnesium: 2 mg/dL (ref 1.7–2.4)

## 2020-10-01 MED ORDER — CHLORHEXIDINE GLUCONATE CLOTH 2 % EX PADS
6.0000 | MEDICATED_PAD | Freq: Every day | CUTANEOUS | Status: DC
  Administered 2020-10-01 – 2020-10-03 (×3): 6 via TOPICAL

## 2020-10-01 MED ORDER — LEVOTHYROXINE SODIUM 100 MCG/5ML IV SOLN
31.0000 ug | Freq: Every day | INTRAVENOUS | Status: DC
  Administered 2020-10-01 – 2020-10-02 (×2): 31 ug via INTRAVENOUS
  Filled 2020-10-01 (×2): qty 5

## 2020-10-01 MED ORDER — POTASSIUM CHLORIDE 20 MEQ PO PACK
40.0000 meq | PACK | Freq: Once | ORAL | Status: AC
  Administered 2020-10-01: 11:00:00 40 meq via ORAL
  Filled 2020-10-01: qty 2

## 2020-10-01 MED ORDER — LIP MEDEX EX OINT: 1.0000 "application " | TOPICAL_OINTMENT | CUTANEOUS | Status: DC | PRN

## 2020-10-01 MED ORDER — RESOURCE THICKENUP CLEAR PO POWD
ORAL | Status: DC | PRN
  Filled 2020-10-01: qty 125

## 2020-10-01 MED ORDER — POTASSIUM CHLORIDE 10 MEQ/100ML IV SOLN: 10.0000 meq | INTRAVENOUS | Status: DC

## 2020-10-01 MED ORDER — FOOD THICKENER (SIMPLYTHICK)
1.0000 | ORAL | Status: DC | PRN
  Filled 2020-10-01: qty 1

## 2020-10-01 MED ORDER — ADULT MULTIVITAMIN LIQUID CH
15.0000 mL | Freq: Every day | ORAL | Status: DC
  Administered 2020-10-02 – 2020-10-03 (×2): 15 mL via ORAL
  Filled 2020-10-01 (×2): qty 15

## 2020-10-01 NOTE — Progress Notes (Addendum)
PROGRESS NOTE    Wayne [name]  DUK:[id number] DOB: [birth year] DOA: 09/30/2020 PCP: Patient, No Pcp Per   Chief Complaint  Patient presents with  . 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  Patient admitted noted to be significantly dehydrated, urinalysis concerning for UTI, hypernatremic.  Patient pancultured, placed empirically on IV antibiotics, IV fluids, supportive care.   Assessment & Plan:   Principal Problem:   Acute metabolic encephalopathy Active Problems:   Acute UTI (urinary tract infection)   Hypothyroidism   Hyperlipidemia   Short-term memory loss   Essential hypertension   Dementia (HCC)   AKI (acute kidney injury) ([place])   Pressure injury of skin   DNR (do not resuscitate)   Dehydration   Hypernatremia  1 acute metabolic encephalopathy Likely secondary to acute UTI in the setting of significant dehydration.  Patient noted 3 to 4 days prior to admission to be diagnosed with a UTI.  Patient with worsening confusion and increased combativeness and subsequently presented to the ED.  Patient pancultured.  Clinically improved.  Continue IV fluids, IV Rocephin, supportive care.  Follow.   2.  UTI Urine cultures pending.    Continue IV Rocephin.  3.  Hypernatremia Likely secondary to severe dehydration/volume depletion.  Patient clinically dry on examination.  Patient on IV fluids.  Change IV fluids  to D5 half-normal saline.  Follow.  4.  Dehydration IV fluids.  5.  Dementia/cognitive deficits Continue home regimen Zoloft, Haldol.   6.  Severe protein calorie malnutrition/failure to thrive Noted during prior hospitalization.  Per discharge summary patient was to follow with palliative care in the outpatient setting.  Patient currently DNR.  Nutritional supplementation.  Palliative care to reassess for goals of care.  7.  Recent perineal necrotizing fasciitis status post extensive debridement Being followed in the outpatient setting by plastics.  Per epic patient last seen by plastic surgery November 2021 at which point in time patient was undergoing wet-to-dry dressing changes which we will continue for now.  Transitions of care team reached out to facility and it was noted that patient was on wet-to-dry dressing changes with ABD pad and covered with mesh underwear twice daily and as needed soiling which we will resume.  Outpatient follow-up with plastics/general surgery.   8.  Dysphagia Patient noted to have been discharged on a dysphagia 2 diet with nectar thick liquids during prior hospitalization.    Patient seen by speech therapy and patient started on a full liquid diet.  SLP following.  9.  Acute kidney injury Likely secondary to prerenal azotemia secondary to dehydration.    Renal function improving with hydration.  I and O cath prn.   10.  Hypothyroidism Home regimen Synthroid resumed.  TSH noted to be elevated at 20.167.  Speech therapy assessing patient and as such we will place patient on IV Synthroid 31 MCG's daily and when oral intake improves and patient placed on a diet will transition back to oral Synthroid at an increased dose of  62.5 MCG's daily.  11.  Hypokalemia Replete.  12.  Pressure injury Wound care consult Pressure Injury 10/11/18 Stage II -  Partial thickness loss of dermis presenting as a shallow open ulcer with a red, pink wound bed without slough.  open areas to coccyx (Active)  10/11/18 1500  Location: Coccyx  Location Orientation:   Staging: Stage II -  Partial thickness loss of dermis presenting as a shallow open ulcer with a red, pink wound bed without slough.  Wound Description (Comments): open areas to coccyx  Present on Admission: Yes     Pressure Injury 07/09/20 Lumbar Right Stage 1 -  Intact skin with non-blanchable redness of a localized area usually over a bony prominence. (Active)  07/09/20 0815  Location: Lumbar  Location Orientation: Right  Staging: Stage 1 -  Intact skin with non-blanchable redness of a localized area usually over a bony prominence.  Wound Description (Comments):   Present on Admission: Yes     Pressure Injury 07/18/20 Heel Right Deep Tissue Pressure Injury - Purple or maroon localized area of discolored intact skin or blood-filled blister due to damage of underlying soft tissue from pressure and/or shear. (Active)  07/18/20 0900  Location: Heel  Location Orientation: Right  Staging: Deep Tissue Pressure Injury - Purple or maroon localized area of discolored intact skin or blood-filled blister due to damage of underlying soft tissue from pressure and/or shear.  Wound Description (Comments):   Present on Admission: No         DVT prophylaxis: Eliquis Code Status: DNR Family Communication: Updated patient.  No family at bedside. Disposition:   Status is: Inpatient    Dispo: The patient is from: SNF              Anticipated d/c is to: SNF              Anticipated d/c date is: 2 to 3 days per              Patient currently on IV fluids, IV antibiotics.  Not stable for discharge.       Consultants:   None  Procedures:   Chest x-ray 09/30/2020    Antimicrobials:   IV Rocephin 09/30/2020>>>>>     Subjective: Patient alert.  Interactive.  Pleasant.  Denies any chest pain or shortness of breath.  States he is feeling better than on admission.  Objective: Vitals:    10/01/20 0021 10/01/20 0421 10/01/20 0753 10/01/20 0833  BP: 93/73 124/82  135/73  Pulse: 81 76  72  Resp: 18 14  14   Temp: (!) 97.4 F (36.3 C) 97.7 F (36.5 C)  98.2 F (36.8 C)  TempSrc: Oral Axillary  Oral  SpO2: 99% 99% 96% 97%  Weight:      Height:        Intake/Output Summary (Last 24 hours) at 10/01/2020 1246 Last data filed at 10/01/2020 0610 Gross per 24 hour  Intake --  Output 600 ml  Net -600 ml   Filed Weights   09/30/20 1559  Weight: 54.4 kg    Examination:  General exam: Alert.  NAD. Respiratory system: Clear to auscultation. Respiratory effort normal. Cardiovascular system: S1 & S2 heard, RRR. No JVD, murmurs, rubs, gallops or clicks. No pedal edema. Gastrointestinal system: Abdomen is nondistended, soft and nontender. No organomegaly or masses felt. Normal bowel sounds heard. Central nervous system: Alert and oriented. No focal neurological deficits. Extremities: Symmetric 5 x 5 power. Skin: No rashes, lesions or ulcers  Psychiatry: Judgement and insight appear normal. Mood & affect appropriate.     Data Reviewed: I have personally reviewed following labs and imaging studies  CBC: Recent Labs  Lab 09/30/20 1550 10/01/20 0529  WBC 10.7* 5.6  NEUTROABS 7.3  --   HGB 10.6* 8.8*  HCT 33.2* 27.9*  MCV 92.0 93.6  PLT 349 580    Basic Metabolic Panel: Recent Labs  Lab 09/30/20 1550 10/01/20 0529  NA 147* 144  K 4.1 3.3*  CL 113* 115*  CO2 19* 19*  GLUCOSE 76 102*  BUN 33* 29*  CREATININE 1.34* 1.05  CALCIUM 9.1 8.3*  MG  --  2.0  PHOS  --  3.1    GFR: Estimated Creatinine Clearance: 34.5 mL/min (by C-G formula based on SCr of 1.05 mg/dL).  Liver Function Tests: Recent Labs  Lab 09/30/20 1550 10/01/20 0529  AST 17 12*  ALT 17 13  ALKPHOS 100 74  BILITOT 0.8 0.2*  PROT 7.1 5.6*  ALBUMIN 3.1* 2.5*    CBG: No results for input(s): GLUCAP in the last 168 hours.   Recent Results (from the past 240 hour(s))  Resp Panel by  RT-PCR (Flu A&B, Covid) Nasopharyngeal Swab     Status: None   Collection Time: 09/30/20  3:30 PM   Specimen: Nasopharyngeal Swab; Nasopharyngeal(NP) swabs in vial transport medium  Result Value Ref Range Status   SARS Coronavirus 2 by RT PCR NEGATIVE NEGATIVE Final    Comment: (NOTE) SARS-CoV-2 target nucleic acids are NOT DETECTED.  The SARS-CoV-2 RNA is generally detectable in upper respiratory specimens during the acute phase of infection. The lowest concentration of SARS-CoV-2 viral copies this assay can detect is 138 copies/mL. A negative result does not preclude SARS-Cov-2 infection and should not be used as the sole basis for treatment or other patient management decisions. A negative result may occur with  improper specimen collection/handling, submission of specimen other than nasopharyngeal swab, presence of viral mutation(s) within the areas targeted by this assay, and inadequate number of viral copies(<138 copies/mL). A negative result must be combined with clinical observations, patient history, and epidemiological information. The expected result is Negative.  Fact Sheet for Patients:  EntrepreneurPulse.com.au  Fact Sheet for Healthcare Providers:  IncredibleEmployment.be  This test is no t yet approved or cleared by the [place] FDA and  has been authorized for detection and/or diagnosis of SARS-CoV-2 by FDA under an Emergency Use Authorization (EUA). This EUA will remain  in effect (meaning this test can be used) for the duration of the COVID-19 declaration under Section 564(b)(1) of the Act, 21 U.S.C.section 360bbb-3(b)(1), unless the authorization is terminated  or revoked sooner.       Influenza A by PCR NEGATIVE NEGATIVE Final   Influenza B by PCR NEGATIVE NEGATIVE Final    Comment: (NOTE) The Xpert Xpress SARS-CoV-2/FLU/RSV plus assay is intended as an aid in the diagnosis of influenza from Nasopharyngeal swab  specimens and should not be used as a sole basis for treatment. Nasal washings and aspirates are unacceptable for Xpert Xpress SARS-CoV-2/FLU/RSV testing.  Fact Sheet for Patients: EntrepreneurPulse.com.au  Fact Sheet for Healthcare Providers: IncredibleEmployment.be  This test is not yet approved or cleared by the [place] FDA and has been authorized for detection and/or diagnosis of SARS-CoV-2 by FDA under an Emergency Use Authorization (EUA). This EUA will remain in effect (meaning this test can be used) for the duration of the COVID-19 declaration under Section 564(b)(1) of the Act, 21 U.S.C. section  360bbb-3(b)(1), unless the authorization is terminated or revoked.  Performed at [facility], [place] [address] [place], [place] [address]   Blood Culture (routine x 2)     Status: None (Preliminary result)   Collection Time: 09/30/20  3:50 PM   Specimen: BLOOD RIGHT FOREARM  Result Value Ref Range Status   Specimen Description   Final    BLOOD RIGHT FOREARM Performed at [place] [address], [place], [place] [address]    Special Requests   Final    BOTTLES DRAWN AEROBIC AND ANAEROBIC Blood Culture adequate volume Performed at [place] [address] [place], [place] [address]    Culture   Final    NO GROWTH < 24 HOURS Performed at [place] [address], [place], [place] [address]    Report Status PENDING  Incomplete  Blood Culture (routine x 2)     Status: None (Preliminary result)   Collection Time: 09/30/20  3:50 PM   Specimen: BLOOD  Result Value Ref Range Status   Specimen Description   Final    BLOOD LEFT ANTECUBITAL Performed at [place] [address] [place], [place] [address]    Special Requests   Final    BOTTLES DRAWN AEROBIC AND ANAEROBIC Blood Culture adequate volume Performed at [place] [address], [place], [place] [address]    Culture   Final    NO GROWTH < 24 HOURS Performed at [place] [address], [place], [place] [address]    Report Status PENDING  Incomplete  Urine culture     Status: Abnormal   Collection Time: 09/30/20  4:02 PM   Specimen: In/Out Cath Urine  Result Value Ref Range Status   Specimen Description   Final    IN/OUT CATH URINE Performed at [place] [address], [place], [place] [address]    Special Requests   Final    NONE Performed at [facility], [place] [address], [place], [place] [address]    Culture MULTIPLE SPECIES PRESENT, SUGGEST RECOLLECTION (A)  Final   Report Status 10/01/2020 FINAL  Final         Radiology Studies: DG Chest Port 1 View  Result Date: 09/30/2020 CLINICAL DATA:  Confusion/altered mental status EXAM: PORTABLE CHEST 1 VIEW COMPARISON:  July 08, 2020 FINDINGS: There is no edema or airspace opacity. Heart is mildly enlarged with pulmonary vascularity normal. No adenopathy. Stimulator leads in lower thoracic region, unchanged. Postoperative change in lower cervical spine. There is aortic atherosclerosis. No bone lesions. IMPRESSION: No edema or airspace opacity. Stable cardiac prominence postoperative changes noted. Aortic Atherosclerosis (ICD10-I70.0). Electronically Signed   By: Lowella Grip III M.D.   On: 09/30/2020 16:51        Scheduled Meds: . albuterol  2.5 mg Inhalation TID  . apixaban  2.5 mg Oral BID  . Chlorhexidine Gluconate Cloth  6 each Topical Daily  . collagenase  1 application Topical Daily  . feeding supplement (PRO-STAT SUGAR FREE 64)  30 mL Oral BID  . haloperidol  2.5 mg Oral QHS  . iron polysaccharides  150 mg Oral Daily  . levothyroxine  31 mcg Intravenous Daily  . liver oil-zinc oxide   Topical TID  . senna  1 tablet Oral QHS  . sertraline  75 mg Oral Daily  . sodium chloride flush  3 mL Intravenous Q12H  Continuous Infusions: . cefTRIAXone (ROCEPHIN)  IV 2 g (09/30/20 1808)  . dextrose 5 % and 0.45% NaCl 125 mL/hr at 09/30/20 1945     LOS: 1 day    Time spent: 35 minutes    [place]rine Seal, MD [facility]   To contact the attending provider between 7A-7P or the covering provider during after hours 7P-7A, please log into the web site [email or web address] and access using universal [place] password for that web site. If you do not have the password, please call the hospital operator.  10/01/2020, 12:46 PM

## 2020-10-01 NOTE — Progress Notes (Signed)
Initial Nutrition Assessment  DOCUMENTATION CODES:   Underweight  INTERVENTION:   -Vital Cuisine TID each provides 500 kcals and 22g protein -Prostat liquid protein PO 30 ml BID with meals, each supplement provides 100 kcal, 15 grams protein.  NUTRITION DIAGNOSIS:   Inadequate oral intake related to lethargy/confusion,dysphagia as evidenced by  (chart review).  GOAL:   Patient will meet greater than or equal to 90% of their needs  MONITOR:   PO intake,Supplement acceptance,Labs,Weight trends,I & O's,Skin  REASON FOR ASSESSMENT:   Malnutrition Screening Tool    ASSESSMENT:   86 y.o. 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  Patient with AMS d/t dementia and acute metabolic encephalopathy. History of severe malnutrition.  SLP evaluated for dysphagia, recommends full liquids-nectar thick d/t moderate aspiration risk.  Will order Vital Cuisine which are nectar thick and high in kcals and protein.   Per weight records, pt has lost 19 lbs since 10/2 (13% wt loss x 2.5 months, significant for time frame). Suspect malnutrition, will need NFPE to be able to diagnose.  Medications: Niferex, KLOR-CON, Senokot, D5 infusion   Labs reviewed: Low K Mg/Phos WNL  NUTRITION - FOCUSED PHYSICAL EXAM:  Deferred- will attempt at follow-up  Diet Order:   Diet Order            Diet full liquid Room service appropriate? Yes; Fluid consistency: Nectar Thick  Diet effective now                  EDUCATION NEEDS:   Not appropriate for education at this time  Skin:  Skin Assessment: Skin Integrity Issues: Skin Integrity Issues:: Other (Comment) Other: medial scrotum wound  Last BM:  PTA  Height:   Ht Readings from Last 1 Encounters:  09/30/20 5\' 8"  (1.727 m)    Weight:   Wt Readings from Last 1 Encounters:  09/30/20 54.4 kg   BMI:  Body mass index is 18.25 kg/m.  Estimated Nutritional Needs:   Kcal:  1650-1850  Protein:  85-95g  Fluid:  1.8L/day  Clayton Bibles, MS, RD, LDN Inpatient Clinical Dietitian Contact information available via Amion

## 2020-10-01 NOTE — TOC Progression Note (Signed)
Transition of Care [facility]) - Progression Note    Patient Details  Name: Wayne [name] MRN: [id number] Date of Birth: April 09, 1926  [facility] CM/SW Contact  Latia Mataya, Marjie Skiff, RN Phone Number: 10/01/2020, 2:00 PM  Clinical Narrative:     This CM was asked by MD to ask SNF what the dressing change regimen had been for scrotal area. Per DON:  Wet to dry dressing changes with Abdominal pad and covered with mesh underwear BID and PRN soiling.   Expected Discharge Plan: Long Term Nursing Home Barriers to Discharge: Continued Medical Work up  Expected Discharge Plan and Services Expected Discharge Plan: [place]   Discharge Planning Services: CM Consult   Living arrangements for the past 2 months: [place]                    Social Determinants of Health (SDOH) Interventions    Readmission Risk Interventions Readmission Risk Prevention Plan 10/01/2020  Transportation Screening Complete  Medication Review Press photographer) Complete  PCP or Specialist appointment within 3-5 days of discharge Complete  PCP/Specialist Appt Not Complete comments sees doctor at [facility]  [place] or [place] Not Complete  [place] or Home Care Consult Pt Refusal Comments From SNF LTC  SW Recovery Care/Counseling Consult Complete  Palliative Care Screening Not Applicable  Skilled Nursing Facility Complete  Some recent data might be hidden

## 2020-10-01 NOTE — Evaluation (Signed)
Occupational Therapy Evaluation Patient Details Name: Wayne [name] MRN: [id number] DOB: 09-30-1926 Today's Date: 10/01/2020    History of Present Illness Patient is a 86 year old male with PMH of severe protein calorie malnutrition, cognitive deficit, chronic diastolic CHF, history of failure to thrive, hospitalized 07/08/2020-07/29/2020 for septic shock 2* to perineal necrotizing fasciitis status post extensive debridement in the OR by urology and general surgery, history of bilateral hydronephrosis with bladder outlet obstruction who presents to the ED with a 3 to 4-day history of worsening mental status, dx with UTI 3 days prior to hospitalization.   Clinical Impression   Patient reports significant assist from staff for ADLs, can "sometimes" feed himself. Also reports use of lift of significant assist x2 to transfer to w/c at SNF. Patient oriented x3. Declined attempting to sit EOB, requiring mod to total A for UB ADLs and total A for LB ADL. Patient appears at baseline, no acute OT needs, recommend D/C back to LTC SNF setting.      Follow Up Recommendations  Other (comment) (return to LTC SNF)    Equipment Recommendations  None recommended by OT       Precautions / Restrictions Precautions Precautions: Fall Restrictions Weight Bearing Restrictions: No      Mobility Bed Mobility Overal bed mobility: Needs Assistance Bed Mobility: Rolling Rolling: Max assist         General bed mobility comments: patient declined sitting up to EOB    Transfers                 General transfer comment: deferred, patient did not want to try sitting up EOB and reports staff uses a lift or heavy assist x2 to chair at baseline        ADL either performed or assessed with clinical judgement   ADL Overall ADL's : At baseline                                       General ADL Comments: patient required mod A to initiate washing his face and total A to take sips of  thickened apple juice. patient reports needing extensive assist from SNF staff for all ADLs                  Pertinent Vitals/Pain Pain Assessment: Faces Faces Pain Scale: Hurts little more Pain Location: back Pain Descriptors / Indicators: Grimacing Pain Intervention(s): Premedicated before session     Hand Dominance Right   Extremity/Trunk Assessment Upper Extremity Assessment Upper Extremity Assessment: Generalized weakness   Lower Extremity Assessment Lower Extremity Assessment: Defer to PT evaluation       Communication Communication Communication: No difficulties   Cognition Arousal/Alertness: Awake/alert Behavior During Therapy: Flat affect Overall Cognitive Status: No family/caregiver present to determine baseline cognitive functioning                                 General Comments: hx of cognitive impairments patient oriented to self, place, time              Home Living Family/patient expects to be discharged to:: Skilled nursing facility  Prior Functioning/Environment Level of Independence: Needs assistance  Gait / Transfers Assistance Needed: patient states it takes +2 or a lift to get him OOB to a wheelchair, has not walked "in a long time" ADL's / Homemaking Assistance Needed: patient reports needing help for dressing, bathing, toilets in the bed and help "sometimes" to eat            OT Problem List: Pain         OT Goals(Current goals can be found in the care plan section) Acute Rehab OT Goals Patient Stated Goal: none stated OT Goal Formulation: All assessment and education complete, DC therapy   AM-PAC OT "6 Clicks" Daily Activity     Outcome Measure Help from another person eating meals?: Total Help from another person taking care of personal grooming?: A Lot Help from another person toileting, which includes using toliet, bedpan, or urinal?: Total Help from  another person bathing (including washing, rinsing, drying)?: Total Help from another person to put on and taking off regular upper body clothing?: Total Help from another person to put on and taking off regular lower body clothing?: Total 6 Click Score: 7   End of Session  Activity Tolerance: Patient limited by pain;Patient limited by fatigue Patient left: in bed;with call bell/phone within reach;with bed alarm set  OT Visit Diagnosis: Pain Pain - part of body:  (back)                Time: 4503-8882 OT Time Calculation (min): 19 min Charges:  OT General Charges $OT Visit: 1 Visit OT Evaluation $OT Eval Low Complexity: 1 Low  Delbert Phenix OT OT pager: [place] 10/01/2020, 1:25 PM

## 2020-10-01 NOTE — Progress Notes (Signed)
PT Cancellation Note  Patient Details Name: Wayne [name] MRN: [id number] DOB: 1926-02-24   Cancelled Treatment:    Reason Eval/Treat Not Completed: PT screened, no needs identified, will sign off. Pt is total care at baseline, resident at [facility], plan is for pt to return to SNF.   [facility] 10/01/2020, 3:19 PM

## 2020-10-01 NOTE — Evaluation (Signed)
Clinical/Bedside Swallow Evaluation Patient Details  Name: Wayne [name] MRN: [id number] Date of Birth: 1926-07-01  Today's Date: 10/01/2020 Time: SLP Start Time (ACUTE ONLY): 0810 SLP Stop Time (ACUTE ONLY): 0825 SLP Time Calculation (min) (ACUTE ONLY): 15 min  Past Medical History:  Past Medical History:  Diagnosis Date  . Anxiety    takes Atrivan daily  . Back pain    chronic with neck pain  . Cancer ([place])    skin  . CHF (congestive heart failure) ([place])   . Constipation    related to medication  . Dementia ([place])    takes Aricept nightly  . Enlarged prostate    self caths once every 1-2wks  . GERD (gastroesophageal reflux disease)    doesn't require medication  . Hx of seasonal allergies   . Hyperlipidemia    takes Simvastatin daily  . Hypertension    takes atenolol daily  . Hypothyroidism    takes Synthroid daily  . Insomnia    related to pain  . MRSA (methicillin resistant staph aureus) culture positive    Per patient tested on 10/07/11.  . Osteoporosis   . Peripheral edema    takes Torsemide daily  . Scoliosis    Past Surgical History:  Past Surgical History:  Procedure Laterality Date  . abdominal cyst     removed-2012;MRSA done by Dr.Gross  . APPLICATION OF A-CELL OF EXTREMITY N/A 07/16/2020   Procedure: placement of primatrix ag mesh;  Surgeon: Cindra Presume, MD;  Location: [facility];  Service: Plastics;  Laterality: N/A;  . CATARACT EXTRACTION    . cataracts     bilateral  . CERVICAL DISC SURGERY     x1  . COLONOSCOPY    . ESOPHAGOGASTRODUODENOSCOPY    . HERNIA REPAIR     Right inguinal  . INCISION AND DRAINAGE OF WOUND N/A 07/09/2020   Procedure: IRRIGATION AND DEBRIDEMENT SCROTUM AND PERINEUM;  Surgeon: Rolm Bookbinder, MD;  Location: [facility];  Service: General;  Laterality: N/A;  . INCISION AND DRAINAGE OF WOUND N/A 07/09/2020   Procedure: DEBRIDEMENT OF SCROTUM;  Surgeon: Robley Fries, MD;  Location: [facility];  Service: Urology;  Laterality:  N/A;  . INCISION AND DRAINAGE OF WOUND N/A 07/16/2020   Procedure: debridement of scrotal wound;  Surgeon: Cindra Presume, MD;  Location: [facility];  Service: Plastics;  Laterality: N/A;  . INTRAMEDULLARY (IM) NAIL INTERTROCHANTERIC Left 10/12/2018  . INTRAMEDULLARY (IM) NAIL INTERTROCHANTERIC Left 10/12/2018   Procedure: INTRAMEDULLARY (IM) NAIL INTERTROCHANTRIC;  Surgeon: Renette Butters, MD;  Location: [facility];  Service: Orthopedics;  Laterality: Left;  . LUMBAR DISC SURGERY     x2  . TOE SURGERY     right great toe  . TRANSURETHRAL RESECTION OF PROSTATE     15+yrs ago   HPI:  86 yo male adm to [facility] with AMS - found to have UTI.  Swallow eval ordered.  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  CXR negative.   Assessment / Plan / Recommendation Clinical Impression  Patient presents with indications of exacerbation of baseline dysphagia with severe xerostomia.  Tablet located in upper anterior dentition without pt awareness, use of moisture and applesauce faciliated clearance.  His dysphagia signs include multiple swallows per bolus, suspected decreased laryngeal elevation/closure observed clinically and cough post-swallow.  Cough and congestion was most notably with thinner consistency.  Pt is weak and cued cough/expectoration is not  productive.  SLP did not test solids requiring mastication due to his gross deconditioning.  At this time, to mitigate aspiration, recommend full liquids/nectar thick and allow thin water between meals after oral care.  Recommend to re-engage palliative team to establish goals of care as pt had been seen by palliative during prior admit.  SLP will follow up for family education/compensations.  MBS could be completed, but won't change his outcomes in this situation.  If goal becomes comfort, liberalization of diet advised. SLP Visit Diagnosis: Dysphagia, oropharyngeal phase  (R13.12)    Aspiration Risk  Moderate aspiration risk;Risk for inadequate nutrition/hydration    Diet Recommendation Nectar-thick liquid;Thin liquid   Liquid Administration via: Cup;No straw Medication Administration: Crushed with puree Supervision: Full supervision/cueing for compensatory strategies Compensations: Slow rate;Small sips/bites Postural Changes: Seated upright at 90 degrees;Remain upright for at least 30 minutes after po intake    Other  Recommendations Oral Care Recommendations: Oral care BID Other Recommendations: Order thickener from pharmacy   Follow up Recommendations        Frequency and Duration min 1 x/week  1 week       Prognosis Prognosis for Safe Diet Advancement: Fair Barriers to Reach Goals: Time post onset      Swallow Study   General Date of Onset: 10/01/20 HPI: 86 yo male adm to [facility] with AMS - found to have UTI.  Swallow eval ordered.  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  CXR negative. Type of Study: Bedside Swallow Evaluation Previous Swallow Assessment: MBS 06/2020 rec dys2/nectar and water protocol Diet Prior to this Study: NPO Temperature Spikes Noted: No Respiratory Status: Room air History of Recent Intubation: No Behavior/Cognition: Alert Oral Cavity Assessment: Dry (pill eroded and lodged between anterior upper teeth on left) Oral Care Completed by SLP: Yes Oral Cavity - Dentition: Poor condition Vision: Impaired for self-feeding Self-Feeding Abilities: Total assist Patient Positioning: Upright in bed Baseline Vocal Quality: Low vocal intensity Volitional Cough: Weak Volitional Swallow: Unable to elicit    Oral/Motor/Sensory Function Overall Oral Motor/Sensory Function: Generalized oral weakness   Ice Chips Ice chips: Not tested   Thin Liquid Thin Liquid: Impaired Other Comments: moisture via toothette results in pt coughing  without expectoration, suspect secretion retention    Nectar Thick Nectar Thick Liquid: Impaired Presentation: Self Fed;Cup Oral Phase Impairments: Reduced lingual movement/coordination Pharyngeal Phase Impairments: Multiple swallows;Suspected delayed Swallow;Decreased hyoid-laryngeal movement;Throat Clearing - Immediate   Honey Thick Honey Thick Liquid: Not tested   Puree Puree: Impaired Presentation: Spoon Pharyngeal Phase Impairments: Multiple swallows;Decreased hyoid-laryngeal movement;Throat Clearing - Immediate;Cough - Delayed   Solid     Solid: Not tested Other Comments: NT due to pt's weakness, dysarthria      Macario Golds 10/01/2020,9:14 AM   Kathleen Lime, MS [place] Office [phone] Pager [phone]

## 2020-10-01 NOTE — TOC Initial Note (Addendum)
Transition of Care [facility]) - Initial/Assessment Note    Patient Details  Name: Wayne [name] MRN: [id number] Date of Birth: January 16, 1926  Transition of Care [facility]) CM/SW Contact:    Liani Caris, Marjie Skiff, RN Phone Number: 10/01/2020, 10:40 AM  Clinical Narrative:                 Pt is a long term care resident at [facility]. Spoke with Olivia Mackie from [facility] who confirms he can go back there at dc. FL2 started. TOC will continue to follow.  Expected Discharge Plan: Long Term Nursing Home Barriers to Discharge: Continued Medical Work up   Expected Discharge Plan and Services Expected Discharge Plan: [place]   Discharge Planning Services: CM Consult   Living arrangements for the past 2 months: [place]                       Prior Living Arrangements/Services Living arrangements for the past 2 months: [place] Lives with:: Facility Resident          Activities of Daily Living Home Assistive Devices/Equipment: Other (Comment) (pt unable to verbalize what equipment he uses, no notes in computer stating what he uses) ADL Screening (condition at time of admission) Patient's cognitive ability adequate to safely complete daily activities?: No Is the patient deaf or have difficulty hearing?: No Does the patient have difficulty seeing, even when wearing glasses/contacts?: No Does the patient have difficulty concentrating, remembering, or making decisions?: Yes Patient able to express need for assistance with ADLs?: No Does the patient have difficulty dressing or bathing?: Yes Independently performs ADLs?: No Communication: Independent Dressing (OT): Needs assistance Is this a change from baseline?: Pre-admission baseline Grooming: Needs assistance Is this a change from baseline?: Pre-admission baseline Feeding: Needs assistance Is this a change from baseline?: Pre-admission baseline Bathing: Needs assistance Is this a change from  baseline?: Pre-admission baseline Toileting: Needs assistance Is this a change from baseline?: Pre-admission baseline In/Out Bed: Needs assistance Is this a change from baseline?: Pre-admission baseline Walks in Home: Needs assistance Is this a change from baseline?: Pre-admission baseline Does the patient have difficulty walking or climbing stairs?: Yes Weakness of Legs: Both Weakness of Arms/Hands: Both   Admission diagnosis:  Acute metabolic encephalopathy [H41.93] Patient Active Problem List   Diagnosis Date Noted  . Acute metabolic encephalopathy 79/11/4095  . Dehydration 09/30/2020  . Hypernatremia 09/30/2020  . Scrotal infection   . DNR (do not resuscitate)   . DNI (do not intubate)   . Goals of care, counseling/discussion   . Palliative care by specialist   . Pressure injury of skin 07/09/2020  . Cellulitis of scrotum 07/08/2020  . Dementia ([place])   . Stercoral colitis   . Obstructive uropathy   . Bilateral hydronephrosis   . Sepsis due to undetermined organism ([place])   . AKI (acute kidney injury) ([place])   . Acute UTI (urinary tract infection)   . Hyperkalemia   . Hip fracture, unspecified laterality, closed, initial encounter ([place]) 10/11/2018  . Hip fracture ([place]) 10/11/2018  . Closed intertrochanteric fracture of left femur ([place]) 10/11/2018  . Viral URI with cough 10/20/2017  . Pneumonia of left lower lobe due to infectious organism 10/20/2017  . Renal insufficiency 01/25/2012  . Insomnia 12/21/2011  . Venous insufficiency 08/24/2011  . ALLERGIC RHINITIS 12/10/2008  . DERMATITIS, CONTACT, NOS 08/11/2007  . Short-term memory loss 08/02/2007  . Hypothyroidism 04/07/2007  . Hyperlipidemia 04/07/2007  .  Essential hypertension 04/07/2007   PCP:  Patient, No Pcp Per Pharmacy:   [id number] - [place], [place]. AT [address]. [place] [address] Phone: [phone] Fax:  [phone]     Social Determinants of Health (SDOH) Interventions    Readmission Risk Interventions Readmission Risk Prevention Plan 10/01/2020  Transportation Screening Complete  Medication Review Press photographer) Complete  PCP or Specialist appointment within 3-5 days of discharge Complete  PCP/Specialist Appt Not Complete comments sees doctor at [facility]  [place] or [place] Not Complete  HRI or Home Care Consult Pt Refusal Comments From SNF LTC  SW Recovery Care/Counseling Consult Complete  Palliative Care Screening Not Applicable  Skilled Nursing Facility Complete  Some recent data might be hidden

## 2020-10-02 LAB — CBC WITH DIFFERENTIAL/PLATELET
Abs Immature Granulocytes: 0.03 10*3/uL (ref 0.00–0.07)
Basophils Absolute: 0 10*3/uL (ref 0.0–0.1)
Basophils Relative: 0 %
Eosinophils Absolute: 0.1 10*3/uL (ref 0.0–0.5)
Eosinophils Relative: 1 %
HCT: 28.6 % — ABNORMAL LOW (ref 39.0–52.0)
Hemoglobin: 8.9 g/dL — ABNORMAL LOW (ref 13.0–17.0)
Immature Granulocytes: 0 %
Lymphocytes Relative: 15 %
Lymphs Abs: 1.2 10*3/uL (ref 0.7–4.0)
MCH: 29.6 pg (ref 26.0–34.0)
MCHC: 31.1 g/dL (ref 30.0–36.0)
MCV: 95 fL (ref 80.0–100.0)
Monocytes Absolute: 0.6 10*3/uL (ref 0.1–1.0)
Monocytes Relative: 7 %
Neutro Abs: 6.5 10*3/uL (ref 1.7–7.7)
Neutrophils Relative %: 77 %
Platelets: 216 10*3/uL (ref 150–400)
RBC: 3.01 MIL/uL — ABNORMAL LOW (ref 4.22–5.81)
RDW: 15.4 % (ref 11.5–15.5)
WBC: 8.5 10*3/uL (ref 4.0–10.5)
nRBC: 0 % (ref 0.0–0.2)

## 2020-10-02 LAB — RENAL FUNCTION PANEL
Albumin: 2.3 g/dL — ABNORMAL LOW (ref 3.5–5.0)
Anion gap: 5 (ref 5–15)
BUN: 22 mg/dL (ref 8–23)
CO2: 20 mmol/L — ABNORMAL LOW (ref 22–32)
Calcium: 8 mg/dL — ABNORMAL LOW (ref 8.9–10.3)
Chloride: 113 mmol/L — ABNORMAL HIGH (ref 98–111)
Creatinine, Ser: 1.11 mg/dL (ref 0.61–1.24)
GFR, Estimated: >60 mL/min (ref >60)
Glucose, Bld: 98 mg/dL (ref 70–99)
Phosphorus: 2.5 mg/dL (ref 2.5–4.6)
Potassium: 3.1 mmol/L — ABNORMAL LOW (ref 3.5–5.1)
Sodium: 138 mmol/L (ref 135–145)

## 2020-10-02 LAB — SARS CORONAVIRUS 2 BY RT PCR (HOSPITAL ORDER, PERFORMED IN ~~LOC~~ HOSPITAL LAB): SARS Coronavirus 2: NEGATIVE

## 2020-10-02 LAB — MAGNESIUM: Magnesium: 1.7 mg/dL (ref 1.7–2.4)

## 2020-10-02 MED ORDER — LEVOTHYROXINE SODIUM 50 MCG PO TABS
75.0000 ug | ORAL_TABLET | Freq: Every day | ORAL | Status: DC
  Administered 2020-10-03: 06:00:00 75 ug via ORAL
  Filled 2020-10-02: qty 1

## 2020-10-02 MED ORDER — PHENOL 1.4 % MT LIQD
1.0000 | OROMUCOSAL | Status: DC | PRN
  Filled 2020-10-02: qty 177

## 2020-10-02 MED ORDER — ENOXAPARIN SODIUM 40 MG/0.4ML ~~LOC~~ SOLN
40.0000 mg | SUBCUTANEOUS | Status: DC
  Administered 2020-10-02 – 2020-10-03 (×2): 40 mg via SUBCUTANEOUS
  Filled 2020-10-02 (×2): qty 0.4

## 2020-10-02 MED ORDER — POTASSIUM CHLORIDE 20 MEQ PO PACK
40.0000 meq | PACK | ORAL | Status: AC
  Administered 2020-10-02 (×2): 40 meq via ORAL
  Filled 2020-10-02 (×2): qty 2

## 2020-10-02 NOTE — Progress Notes (Signed)
Patient ID: Wayne [name], male   DOB: 1926/09/29, 86 y.o.   MRN: [id number]  PROGRESS NOTE    ZAEDEN LASTINGER  JFH:[id number] DOB: 01/06/1926 DOA: 09/30/2020 PCP: Patient, No Pcp Per   Brief Narrative:  86 year old gentleman from skilled nursing facility with past medical history of severe protein calorie malnutrition, cognitive deficits, chronic diastolic CHF, recently hospitalized 07/08/2020-07/29/2020 for 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  On admission, he was noted to be significantly dehydrated, hyponatremic and had UTI.  He was started on IV fluids and antibiotics.  Assessment & Plan:   Acute metabolic encephalopathy -Possibly from UTI and dehydration.  Mental status has much improved, still slow to respond. -Antibiotics and fluids as below.  UTI -Urine cultures growing multiple species.  Continue Rocephin for now and switch to oral antibiotic on discharge  Hypernatremia Dehydration Acute metabolic acidosis Acute kidney injury -Sodium 138 today.  Decrease IV fluids to 50 cc an hour.  Encourage oral intake. -Acute kidney injury has resolved  Dementia/cognitive deficits Generalized deconditioning Severe protein calorie malnutrition/failure to thrive -Continue Haldol and sertraline -Follow nutrition recommendations -Palliative care evaluation for goals of care pending -Fall precautions  Dysphagia -SLP following.  Diet as per SLP recommendations.  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 Per epic, patient was last seen by plastic surgery in November 202, at which point in time  patient was undergoing wet-to-dry dressing changes which we will continue for now - Outpatient follow-up with plastics/general surgery.  Hypothyroidism -TSH 20.167.  Currently on IV Synthroid: Switch to oral Synthroid as patient is tolerating oral intake  Hypokalemia -Replace.  Repeat a.m. labs.  Anemia of chronic disease -From chronic illnesses.  Hemoglobin stable   DVT prophylaxis: DC Eliquis.  Use Lovenox Code Status: DNR Family Communication: None at bedside Disposition Plan: Status is: Inpatient  Remains inpatient appropriate because:Inpatient level of care appropriate due to severity of illness   Dispo: The patient is from: SNF              Anticipated d/c is to: SNF              Anticipated d/c date is: 1 day              Patient currently is not medically stable to d/c.  Discharge in 1 to 2 days if clinically remained stable.  Consultants: Palliative care consultation is pending  Procedures: None  Antimicrobials: Rocephin from 09/30/2020 onwards   Subjective: Patient seen and examined at bedside.  Poor historian but awake, answers some questions very slowly.  No overnight fever, vomiting reported  Objective: Vitals:   10/01/20 2037 10/01/20 2050 10/02/20 0558 10/02/20 0804  BP: 121/65  136/75   Pulse: 62  76   Resp: 14  14   Temp: 97.7 F (36.5 C)  98.4 F (36.9 C)   TempSrc: Axillary  Oral   SpO2: 98% 94% 95% 97%  Weight:      Height:        Intake/Output Summary (Last 24 hours) at 10/02/2020 0944 Last data filed at 10/02/2020 0616 Gross per 24 hour  Intake 50 ml  Output 500 ml  Net -450 ml   Filed Weights   09/30/20 1559  Weight: 54.4 kg    Examination:  General exam: Appears calm and comfortable.  Elderly male lying in bed.  No distress. Respiratory system: Bilateral decreased breath sounds at bases Cardiovascular system: S1 & S2 heard, Rate controlled Gastrointestinal system: Abdomen is nondistended, soft and nontender. Normal bowel  sounds heard. Extremities: No cyanosis, clubbing, edema  Central nervous system: Poor historian but awake, answers some questions very slowly. No focal neurological deficits. Moving extremities Skin: No obvious ecchymosis/lesions Psychiatry: Flat affect    Data Reviewed: I have personally reviewed following labs and imaging studies  CBC: Recent Labs  Lab 09/30/20 1550 10/01/20 0529 10/02/20 0748  WBC 10.7* 5.6 8.5  NEUTROABS 7.3  --  6.5  HGB 10.6* 8.8* 8.9*  HCT 33.2* 27.9* 28.6*  MCV 92.0 93.6 95.0  PLT 349 240 604   Basic Metabolic Panel: Recent Labs  Lab 09/30/20 1550 10/01/20 0529 10/02/20 0748  NA 147* 144 138  K 4.1 3.3* 3.1*  CL 113* 115* 113*  CO2 19* 19* 20*  GLUCOSE 76 102* 98  BUN 33* 29* 22  CREATININE 1.34* 1.05 1.11  CALCIUM 9.1 8.3* 8.0*  MG  --  2.0 1.7  PHOS  --  3.1 2.5   GFR: Estimated Creatinine Clearance: 32.7 mL/min (by C-G formula based on SCr of 1.11 mg/dL). Liver Function Tests: Recent Labs  Lab 09/30/20 1550 10/01/20 0529 10/02/20 0748  AST 17 12*  --   ALT 17 13  --   ALKPHOS 100 74  --   BILITOT 0.8 0.2*  --   PROT 7.1 5.6*  --   ALBUMIN 3.1* 2.5* 2.3*   No results for input(s): LIPASE, AMYLASE in the last 168 hours. No results for input(s): AMMONIA in the last 168 hours. Coagulation Profile: Recent Labs  Lab 09/30/20 1550  INR 1.4*   Cardiac Enzymes: No results for input(s): CKTOTAL, CKMB, CKMBINDEX, TROPONINI in the last 168 hours. BNP (last 3 results) No results for input(s): PROBNP in the last 8760 hours. HbA1C: No results for input(s): HGBA1C in the last 72 hours. CBG: No results for input(s): GLUCAP in the last 168 hours. Lipid Profile: No results for input(s): CHOL, HDL, LDLCALC, TRIG, CHOLHDL, LDLDIRECT in the last 72 hours. Thyroid Function Tests: Recent Labs    10/01/20 0529  TSH 20.167*   Anemia Panel: No results for input(s): VITAMINB12, FOLATE, FERRITIN, TIBC, IRON, RETICCTPCT in the last 72  hours. Sepsis Labs: Recent Labs  Lab 09/30/20 1550  LATICACIDVEN 1.4    Recent Results (from the past 240 hour(s))  Resp Panel by RT-PCR (Flu A&B, Covid) Nasopharyngeal Swab     Status: None   Collection Time: 09/30/20  3:30 PM   Specimen: Nasopharyngeal Swab; Nasopharyngeal(NP) swabs in vial transport medium  Result Value Ref Range Status   SARS Coronavirus 2 by RT PCR NEGATIVE NEGATIVE Final    Comment: (NOTE) SARS-CoV-2 target nucleic acids are NOT DETECTED.  The SARS-CoV-2 RNA is generally detectable in upper respiratory specimens during the acute phase of infection. The lowest concentration of SARS-CoV-2 viral copies this assay can detect is 138 copies/mL. A negative result does not preclude SARS-Cov-2 infection and should not be used as the sole basis for treatment or other patient management decisions. A negative result may occur with  improper specimen collection/handling, submission of specimen other than nasopharyngeal swab, presence of viral mutation(s) within the areas targeted by this assay, and inadequate number  of viral copies(<138 copies/mL). A negative result must be combined with clinical observations, patient history, and epidemiological information. The expected result is Negative.  Fact Sheet for Patients:  EntrepreneurPulse.com.au  Fact Sheet for Healthcare Providers:  IncredibleEmployment.be  This test is no t yet approved or cleared by the [place] FDA and  has been authorized for detection and/or diagnosis of SARS-CoV-2 by FDA under an Emergency Use Authorization (EUA). This EUA will remain  in effect (meaning this test can be used) for the duration of the COVID-19 declaration under Section 564(b)(1) of the Act, 21 U.S.C.section 360bbb-3(b)(1), unless the authorization is terminated  or revoked sooner.       Influenza A by PCR NEGATIVE NEGATIVE Final   Influenza B by PCR NEGATIVE NEGATIVE Final     Comment: (NOTE) The Xpert Xpress SARS-CoV-2/FLU/RSV plus assay is intended as an aid in the diagnosis of influenza from Nasopharyngeal swab specimens and should not be used as a sole basis for treatment. Nasal washings and aspirates are unacceptable for Xpert Xpress SARS-CoV-2/FLU/RSV testing.  Fact Sheet for Patients: EntrepreneurPulse.com.au  Fact Sheet for Healthcare Providers: IncredibleEmployment.be  This test is not yet approved or cleared by the [place] FDA and has been authorized for detection and/or diagnosis of SARS-CoV-2 by FDA under an Emergency Use Authorization (EUA). This EUA will remain in effect (meaning this test can be used) for the duration of the COVID-19 declaration under Section 564(b)(1) of the Act, 21 U.S.C. section 360bbb-3(b)(1), unless the authorization is terminated or revoked.  Performed at [facility], [place] [address] [place], [place] [address]   Blood Culture (routine x 2)     Status: None (Preliminary result)   Collection Time: 09/30/20  3:50 PM   Specimen: BLOOD RIGHT FOREARM  Result Value Ref Range Status   Specimen Description   Final    BLOOD RIGHT FOREARM Performed at [facility] [address] [place], [place] [address]    Special Requests   Final    BOTTLES DRAWN AEROBIC AND ANAEROBIC Blood Culture adequate volume Performed at [facility] [address], [place], [place] [address]    Culture   Final    NO GROWTH < 24 HOURS Performed at [facility] [address], [place], [place] [address]    Report Status PENDING  Incomplete  Blood Culture (routine x 2)     Status: None (Preliminary result)   Collection Time: 09/30/20  3:50 PM   Specimen: BLOOD  Result Value Ref Range Status   Specimen Description   Final    BLOOD LEFT ANTECUBITAL Performed at [facility] [address] [place], [place] [address]     Special Requests   Final    BOTTLES DRAWN AEROBIC AND ANAEROBIC Blood Culture adequate volume Performed at [facility] [address] [place], [place] [address]    Culture   Final    NO GROWTH < 24 HOURS Performed at [facility] [address] [place], [place] [address]    Report Status PENDING  Incomplete  Urine culture     Status: Abnormal   Collection Time: 09/30/20  4:02 PM   Specimen: In/Out Cath Urine  Result Value Ref Range Status   Specimen Description   Final    IN/OUT CATH URINE Performed at [facility] [address] [place], [place] [address]    Special Requests   Final    NONE  Performed at [facility], [place] [address], [place], [place] [address]    Culture MULTIPLE SPECIES PRESENT, SUGGEST RECOLLECTION (A)  Final   Report Status 10/01/2020 FINAL  Final         Radiology Studies: DG Chest Port 1 View  Result Date: 09/30/2020 CLINICAL DATA:  Confusion/altered mental status EXAM: PORTABLE CHEST 1 VIEW COMPARISON:  July 08, 2020 FINDINGS: There is no edema or airspace opacity. Heart is mildly enlarged with pulmonary vascularity normal. No adenopathy. Stimulator leads in lower thoracic region, unchanged. Postoperative change in lower cervical spine. There is aortic atherosclerosis. No bone lesions. IMPRESSION: No edema or airspace opacity. Stable cardiac prominence postoperative changes noted. Aortic Atherosclerosis (ICD10-I70.0). Electronically Signed   By: Lowella Grip III M.D.   On: 09/30/2020 16:51        Scheduled Meds:  albuterol  2.5 mg Inhalation TID   apixaban  2.5 mg Oral BID   Chlorhexidine Gluconate Cloth  6 each Topical Daily   collagenase  1 application Topical Daily   feeding supplement (PRO-STAT SUGAR FREE 64)  30 mL Oral BID   haloperidol  2.5 mg Oral QHS   iron polysaccharides  150 mg Oral Daily   levothyroxine  31 mcg Intravenous Daily   liver  oil-zinc oxide   Topical TID   multivitamin  15 mL Oral Daily   senna  1 tablet Oral QHS   sertraline  75 mg Oral Daily   sodium chloride flush  3 mL Intravenous Q12H   Continuous Infusions:  cefTRIAXone (ROCEPHIN)  IV 2 g (10/01/20 1843)   dextrose 5 % and 0.45% NaCl 100 mL/hr at 10/02/20 0400          Aline August, MD [facility] 10/02/2020, 9:44 AM

## 2020-10-02 NOTE — Consult Note (Addendum)
WOC Nurse Consult Note: Pt had I&D surgery to scrotum performed on 10/15 and is followed as an outpatient by the urology service.  They have orderd moist gauze dressings to be performed.  Reason for Consult: Chronic full thickness post-op wound to posterior scrotum which extends to near the rectum, red, moist and healing. Small amt yellow drainage, no odor, 6.5X5X.2cm.  Pt is frequently incontinent of stool and it is difficult to keep the area from becoming soiled.  Topical treatment orders provided for bedside nurses to perform as follows: Apply moist fluffed gauze to posterior scrotum wound, then cover with gauze and ABD pad and mesh underwear.  Change Q day or PRN if soiled with stool. Pt should resume follow-up with Urologist after discharge.  Please re-consult if further assistance is needed.  Thank-you,  Julien Girt MSN, [place], [place], [place], [place]

## 2020-10-02 NOTE — Progress Notes (Signed)
  Speech Language Pathology Treatment: Dysphagia  Patient Details Name: Wayne [name] MRN: [id number] DOB: [birth year] Today's Date: 10/02/2020 Time: 1601-0932 SLP Time Calculation (min) (ACUTE ONLY): 33 min  Assessment / Plan / Recommendation Clinical Impression  Pt benefited from minimal verbal cues to take small single sips effectively preventing overt coughing with water intake.  No indications of aspiration with Cola and medications with applesauce.  Overt cough after pt took liquid vitamin - ? due to viscocity.  Cued cough and "hock" to expectorate helped to clear frothy secretions x1.  Provided pt with oral suction catheter and had him demonstrate use.  Discussed results/prior testing with pt and his son [facility]) and both determine pt would like to consume thin liquids - focusing on water during meals when more likely to aspirate.  Pt prefers to use straws due to his physical limitations with his arthritis.  Advised son and pt to functional mitigation of aspiration but not prevention given pt's advanced age, cognitive decline, h/o dysphagia, etc.  Further reviewed importance of hydration and 3 pillars of aspiration pna including aspiration, poor dentition and immunocompromise - of which pt has only one risk factor.  Will advance with MD permission.   Pt's mentation is much improved today compared to yesterday but he is hoarse and reports mild throat pain - ? pharamcologic intervention warranted?    HPI HPI: 86 yo male adm to [facility] with AMS - found to have UTI.  Swallow eval ordered.  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  CXR negative.      SLP Plan  All goals met       Recommendations  Diet recommendations: Dysphagia 3 (mechanical soft);Thin liquid (WATER with MEALS) Liquids provided via: Straw Medication Administration: Whole meds with puree Compensations: Slow rate;Small  sips/bites Postural Changes and/or Swallow Maneuvers: Seated upright 90 degrees;Upright 30-60 min after meal                Oral Care Recommendations: Oral care BID SLP Visit Diagnosis: Dysphagia, oropharyngeal phase (R13.12) Plan: All goals met       GO                Wayne [name] 10/02/2020, 11:19 AM  Wayne Lime, MS [facility] SLP Acute Rehab Services Office [phone] Pager [phone]

## 2020-10-03 DIAGNOSIS — R413 Other amnesia: Secondary | ICD-10-CM

## 2020-10-03 DIAGNOSIS — Z66 Do not resuscitate: Secondary | ICD-10-CM

## 2020-10-03 DIAGNOSIS — R531 Weakness: Secondary | ICD-10-CM

## 2020-10-03 LAB — BASIC METABOLIC PANEL
Anion gap: 8 (ref 5–15)
BUN: 18 mg/dL (ref 8–23)
CO2: 19 mmol/L — ABNORMAL LOW (ref 22–32)
Calcium: 8 mg/dL — ABNORMAL LOW (ref 8.9–10.3)
Chloride: 113 mmol/L — ABNORMAL HIGH (ref 98–111)
Creatinine, Ser: 1.05 mg/dL (ref 0.61–1.24)
GFR, Estimated: >60 mL/min (ref >60)
Glucose, Bld: 98 mg/dL (ref 70–99)
Potassium: 3.6 mmol/L (ref 3.5–5.1)
Sodium: 140 mmol/L (ref 135–145)

## 2020-10-03 LAB — CBC WITH DIFFERENTIAL/PLATELET
Abs Immature Granulocytes: 0.02 10*3/uL (ref 0.00–0.07)
Basophils Absolute: 0 10*3/uL (ref 0.0–0.1)
Basophils Relative: 1 %
Eosinophils Absolute: 0.1 10*3/uL (ref 0.0–0.5)
Eosinophils Relative: 2 %
HCT: 29.1 % — ABNORMAL LOW (ref 39.0–52.0)
Hemoglobin: 9.1 g/dL — ABNORMAL LOW (ref 13.0–17.0)
Immature Granulocytes: 0 %
Lymphocytes Relative: 33 %
Lymphs Abs: 1.8 10*3/uL (ref 0.7–4.0)
MCH: 29.4 pg (ref 26.0–34.0)
MCHC: 31.3 g/dL (ref 30.0–36.0)
MCV: 94.2 fL (ref 80.0–100.0)
Monocytes Absolute: 0.4 10*3/uL (ref 0.1–1.0)
Monocytes Relative: 8 %
Neutro Abs: 3.1 10*3/uL (ref 1.7–7.7)
Neutrophils Relative %: 56 %
Platelets: 211 10*3/uL (ref 150–400)
RBC: 3.09 MIL/uL — ABNORMAL LOW (ref 4.22–5.81)
RDW: 15.2 % (ref 11.5–15.5)
WBC: 5.5 10*3/uL (ref 4.0–10.5)
nRBC: 0 % (ref 0.0–0.2)

## 2020-10-03 LAB — MAGNESIUM: Magnesium: 1.7 mg/dL (ref 1.7–2.4)

## 2020-10-03 MED ORDER — LEVOTHYROXINE SODIUM 50 MCG PO TABS: 75.0000 ug | ORAL_TABLET | Freq: Every day | ORAL | Status: AC

## 2020-10-03 MED ORDER — CEPHALEXIN 500 MG PO CAPS: 500.0000 mg | ORAL_CAPSULE | Freq: Three times a day (TID) | ORAL | 0 refills | Status: AC

## 2020-10-03 MED ORDER — TRAMADOL HCL 50 MG PO TABS: 50.0000 mg | ORAL_TABLET | Freq: Three times a day (TID) | ORAL | 0 refills | Status: AC | PRN

## 2020-10-03 NOTE — Progress Notes (Signed)
Report called to [facility] . Pt to leave with chronic foley still in place.  Son is at [facility] now and Educated family on After visit summary.  Ready for discharge.

## 2020-10-03 NOTE — Care Management Important Message (Signed)
Important Message  Patient Details IM Letter given to the Patient. Name: Wayne [name] MRN: [id number] Date of Birth: Mar 05, 1926   Medicare Important Message Given:  Yes     Kerin Salen 10/03/2020, 12:02 PM

## 2020-10-03 NOTE — TOC Transition Note (Signed)
Transition of Care [facility]) - CM/SW Discharge Note   Patient Details  Name: Wayne [name] MRN: [id number] Date of Birth: 1926-10-04  [facility] CM/SW Contact:  Lynnell Catalan, RN Phone Number: 10/03/2020, 10:03 AM   Clinical Narrative:    Pt to dc back to [place] today. RN to call report to [phone]. [facility] called to transport. Yellow DNR on chart for dc.     Barriers to Discharge: Continued Medical Work up Discharge Plan and Services   Discharge Planning Services: CM Consult                Social Determinants of Health (SDOH) Interventions     Readmission Risk Interventions Readmission Risk Prevention Plan 10/01/2020  Transportation Screening Complete  Medication Review [employer]) Complete  PCP or Specialist appointment within 3-5 days of discharge Complete  PCP/Specialist Appt Not Complete comments sees doctor at [facility]  [place] or [place] Not Complete  [place] or Home Care Consult Pt Refusal Comments From SNF LTC  SW Recovery Care/Counseling Consult Complete  Palliative Care Screening Not Applicable  Skilled Nursing Facility Complete  Some recent data might be hidden

## 2020-10-03 NOTE — Discharge Summary (Signed)
Physician Discharge Summary  Wayne [name]:[id number] DOB: [birth year] DOA: 09/30/2020  PCP: Patient, No Pcp Per  Admit date: 09/30/2020 Discharge date: 10/03/2020  Admitted From: SNF Disposition: SNF  Recommendations for Outpatient Follow-up:  1. Follow up with PCP in 1 week with repeat CBC/BMP 2. Outpatient evaluation and follow-up by palliative care 3. Outpatient follow-up with plastic surgery/general surgery 4. Follow up in ED if symptoms worsen or new appear   Home Health: No Equipment/Devices: None  Discharge Condition: Guarded to poor CODE STATUS: DNR Diet recommendation: As per SLP recommendations Dysphagia 3 (mechanical soft);Thin liquid (WATER with MEALS) Liquids provided via: Straw Medication Administration: Whole meds with puree Compensations: Slow rate;Small sips/bites Postural Changes and/or Swallow Maneuvers: Seated upright 90 degrees;Upright 30-60 min after meal  Brief/Interim Summary: 86 year old gentleman from skilled nursing facility with past medical history of severe protein calorie malnutrition, cognitive deficits, chronic diastolic CHF, recently hospitalized 07/08/2020-07/29/2020 for 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  On admission, he was noted to be significantly dehydrated, hyponatremic and had UTI.  He was started on IV fluids and antibiotics.  During the hospitalization, wound culture grew multiple species.  His mental status has improved and possibly is back to baseline.  He is currently hemodynamically stable.  He will be discharged back to SNF on oral Keflex.  Outpatient follow-up with palliative care to continue addressing goals of care and  if condition were to worsen, recommend comfort measures.  Discharge Diagnoses:   Acute metabolic encephalopathy -Possibly from UTI and dehydration.  Mental status has much improved, still slow to respond but probably back to his baseline. -Antibiotics plan as below. -Continue fall precautions.  UTI -Urine cultures growing multiple species.    Currently on Rocephin for now and switch to oral Keflex for 4 more days upon discharge.  Hypernatremia Dehydration Acute metabolic acidosis Acute kidney injury -Sodium 140 today.    Currently on IV fluids.  DC IV fluids.  Encourage oral intake. -Acute kidney injury has resolved  Dementia/cognitive deficits Generalized deconditioning Severe protein calorie malnutrition/failure to thrive -Continue Haldol and sertraline -Follow nutrition recommendations -Palliative care evaluation for goals of care pending.  Recommend this to happen at SNF. -Fall precautions  Dysphagia -SLP following.  Diet as per SLP recommendations.  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 Per epic, patient was last seen by plastic surgery in November 202, at which point in time patient was undergoing wet-to-dry dressing changes which we will continue for now -Outpatient follow-up with plastics/general surgery.  Hypothyroidism -TSH 20.167.    Increase levothyroxine to 75 mcg daily.  Hypokalemia -Improved with replacement.  Outpatient follow-up.  Anemia of chronic disease -From chronic illnesses.  Hemoglobin stable  Discharge Instructions  Discharge Instructions    Amb Referral to Palliative Care   Complete by: As directed    Goals of care   Diet - low sodium heart healthy   Complete by: As directed    As per SLP recommendations   Discharge wound care:   Complete by: As directed    Continue prior wound care   Increase activity slowly   Complete by: As directed  Allergies as of 10/03/2020      Reactions   Doxazosin Mesylate Other (See Comments)   Makes blood pressure   Propoxyphene Hcl Other (See Comments)   REACTION: upset stomach   Penicillins Rash   Tolerates cephalosporins including cefazolin  DID THE REACTION INVOLVE: Swelling of the face/tongue/throat, SOB, or low BP? No Sudden or severe rash/hives, skin peeling, or the inside of the mouth or nose? Yes Did it require medical treatment? Yes When did it last happen?50 yrs ago If all above answers are "NO", may proceed with cephalosporin use.      Medication List    STOP taking these medications   apixaban 2.5 MG Tabs tablet Commonly known as: ELIQUIS   haloperidol lactate 5 MG/ML injection Commonly known as: HALDOL   HYDROcodone-acetaminophen 5-325 MG tablet Commonly known as: NORCO/VICODIN   NON FORMULARY   sulfamethoxazole-trimethoprim 800-160 MG tablet Commonly known as: BACTRIM DS     TAKE these medications   Albuterol Sulfate 2.5 MG/0.5ML Nebu Inhale 2.5 mg into the lungs 3 (three) times daily.   cephALEXin 500 MG capsule Commonly known as: KEFLEX Take 1 capsule (500 mg total) by mouth 3 (three) times daily for 4 days.   Dermacloud Crea Apply 1 application topically in the morning, at noon, and at bedtime. To buttocks   feeding supplement (PRO-STAT SUGAR FREE 64) Liqd Take 30 mLs by mouth in the morning and at bedtime.   ferrous sulfate 325 (65 FE) MG tablet Take 1 tablet (325 mg total) by mouth daily with breakfast.   haloperidol 5 MG tablet Commonly known as: HALDOL Take 2.5 mg by mouth at bedtime.   iron polysaccharides 150 MG capsule Commonly known as: NIFEREX Take 150 mg by mouth daily.   lactose free nutrition Liqd Take 237 mLs by mouth 3 (three) times daily with meals.   levothyroxine 50 MCG tablet Commonly known as: SYNTHROID Take 1.5 tablets (75 mcg total) by mouth daily before breakfast. What changed: how much to take   liver  oil-zinc oxide 40 % ointment Commonly known as: DESITIN Apply topically 3 (three) times daily.   mirtazapine 15 MG tablet Commonly known as: REMERON Take 15 mg by mouth at bedtime.   polyethylene glycol 17 g packet Commonly known as: MIRALAX / GLYCOLAX Take 17 g by mouth daily as needed for mild constipation. What changed: when to take this   promethazine 25 MG tablet Commonly known as: PHENERGAN Take 25 mg by mouth 2 (two) times daily as needed for nausea or vomiting.   Santyl ointment Generic drug: collagenase Apply 1 application topically daily.   senna-docusate 8.6-50 MG tablet Commonly known as: Senokot-S Take 1 tablet by mouth at bedtime as needed for mild constipation. What changed: when to take this   sertraline 25 MG tablet Commonly known as: ZOLOFT Take 75 mg by mouth daily.   simvastatin 40 MG tablet Commonly known as: ZOCOR TAKE 1 TABLET BY MOUTH EVERY DAY AT NIGHT What changed:   how much to take  how to take this  when to take this  additional instructions   traMADol 50 MG tablet Commonly known as: ULTRAM Take 1 tablet (50 mg total) by mouth 3 (three) times daily as needed for moderate pain or severe pain.   vitamin C 1000 MG tablet Take 1,000 mg by mouth in the morning and at bedtime.   Zinc Sulfate 220 (50 Zn) MG Tabs Take 220 mg by mouth daily.  Discharge Care Instructions  (From admission, onward)         Start     Ordered   10/03/20 0000  Discharge wound care:       Comments: Continue prior wound care   10/03/20 0900          Follow-up Information    pcp. Schedule an appointment as soon as possible for a visit in 1 week(s).   Why: With repeat CBC/BMP             Allergies  Allergen Reactions  . Doxazosin Mesylate Other (See Comments)    Makes blood pressure  . Propoxyphene Hcl Other (See Comments)    REACTION: upset stomach  . Penicillins Rash    Tolerates cephalosporins including cefazolin  DID THE  REACTION INVOLVE: Swelling of the face/tongue/throat, SOB, or low BP? No Sudden or severe rash/hives, skin peeling, or the inside of the mouth or nose? Yes Did it require medical treatment? Yes When did it last happen?50 yrs ago If all above answers are "NO", may proceed with cephalosporin use.     Consultations:  Palliative care evaluation pending   Procedures/Studies: DG Chest Port 1 View  Result Date: 09/30/2020 CLINICAL DATA:  Confusion/altered mental status EXAM: PORTABLE CHEST 1 VIEW COMPARISON:  July 08, 2020 FINDINGS: There is no edema or airspace opacity. Heart is mildly enlarged with pulmonary vascularity normal. No adenopathy. Stimulator leads in lower thoracic region, unchanged. Postoperative change in lower cervical spine. There is aortic atherosclerosis. No bone lesions. IMPRESSION: No edema or airspace opacity. Stable cardiac prominence postoperative changes noted. Aortic Atherosclerosis (ICD10-I70.0). Electronically Signed   By: Lowella Grip III M.D.   On: 09/30/2020 16:51       Subjective: Patient seen and examined at bedside.  Wakes up slightly, answers only a few questions and is wondering if he can go back to the facility today.  Poor historian.  No overnight fever, vomiting reported by nursing staff. Discharge Exam: Vitals:   10/03/20 0625 10/03/20 0628  BP: (!) 145/84   Pulse: 75   Resp: 17   Temp: 98.1 F (36.7 C)   SpO2: 97% 93%    General: Elderly male lying in bed.  Looks slightly, answers only few questions.  Poor historian.  Slow to respond. Cardiovascular: rate controlled, S1/S2 + Respiratory: bilateral decreased breath sounds at bases with some scattered crackles Abdominal: Soft, NT, ND, bowel sounds + Extremities: no edema, no cyanosis    The results of significant diagnostics from this hospitalization (including imaging, microbiology, ancillary and laboratory) are listed below for reference.     Microbiology: Recent  Results (from the past 240 hour(s))  Resp Panel by RT-PCR (Flu A&B, Covid) Nasopharyngeal Swab     Status: None   Collection Time: 09/30/20  3:30 PM   Specimen: Nasopharyngeal Swab; Nasopharyngeal(NP) swabs in vial transport medium  Result Value Ref Range Status   SARS Coronavirus 2 by RT PCR NEGATIVE NEGATIVE Final    Comment: (NOTE) SARS-CoV-2 target nucleic acids are NOT DETECTED.  The SARS-CoV-2 RNA is generally detectable in upper respiratory specimens during the acute phase of infection. The lowest concentration of SARS-CoV-2 viral copies this assay can detect is 138 copies/mL. A negative result does not preclude SARS-Cov-2 infection and should not be used as the sole basis for treatment or other patient management decisions. A negative result may occur with  improper specimen collection/handling, submission of specimen other than nasopharyngeal swab, presence of viral mutation(s) within the areas targeted  by this assay, and inadequate number of viral copies(<138 copies/mL). A negative result must be combined with clinical observations, patient history, and epidemiological information. The expected result is Negative.  Fact Sheet for Patients:  EntrepreneurPulse.com.au  Fact Sheet for Healthcare Providers:  IncredibleEmployment.be  This test is no t yet approved or cleared by the [place] FDA and  has been authorized for detection and/or diagnosis of SARS-CoV-2 by FDA under an Emergency Use Authorization (EUA). This EUA will remain  in effect (meaning this test can be used) for the duration of the COVID-19 declaration under Section 564(b)(1) of the Act, 21 U.S.C.section 360bbb-3(b)(1), unless the authorization is terminated  or revoked sooner.       Influenza A by PCR NEGATIVE NEGATIVE Final   Influenza B by PCR NEGATIVE NEGATIVE Final    Comment: (NOTE) The Xpert Xpress SARS-CoV-2/FLU/RSV plus assay is intended as an aid in the  diagnosis of influenza from Nasopharyngeal swab specimens and should not be used as a sole basis for treatment. Nasal washings and aspirates are unacceptable for Xpert Xpress SARS-CoV-2/FLU/RSV testing.  Fact Sheet for Patients: EntrepreneurPulse.com.au  Fact Sheet for Healthcare Providers: IncredibleEmployment.be  This test is not yet approved or cleared by the [place] FDA and has been authorized for detection and/or diagnosis of SARS-CoV-2 by FDA under an Emergency Use Authorization (EUA). This EUA will remain in effect (meaning this test can be used) for the duration of the COVID-19 declaration under Section 564(b)(1) of the Act, 21 U.S.C. section 360bbb-3(b)(1), unless the authorization is terminated or revoked.  Performed at [facility], [place] [address] [place], [place] [address]   Blood Culture (routine x 2)     Status: None (Preliminary result)   Collection Time: 09/30/20  3:50 PM   Specimen: BLOOD RIGHT FOREARM  Result Value Ref Range Status   Specimen Description   Final    BLOOD RIGHT FOREARM Performed at [facility] [address], [place], [place] [address]    Special Requests   Final    BOTTLES DRAWN AEROBIC AND ANAEROBIC Blood Culture adequate volume Performed at [facility] [address] [place], [place] [address]    Culture   Final    NO GROWTH 2 DAYS Performed at [facility] [address] [place], [place] [address]    Report Status PENDING  Incomplete  Blood Culture (routine x 2)     Status: None (Preliminary result)   Collection Time: 09/30/20  3:50 PM   Specimen: BLOOD  Result Value Ref Range Status   Specimen Description   Final    BLOOD LEFT ANTECUBITAL Performed at [facility] [address] [place], [place] [address]    Special Requests   Final    BOTTLES DRAWN AEROBIC AND ANAEROBIC Blood Culture adequate  volume Performed at [facility] [address] [place], [place] [address]    Culture   Final    NO GROWTH 2 DAYS Performed at [facility] [address], [place], [place] [address]    Report Status PENDING  Incomplete  Urine culture     Status: Abnormal   Collection Time: 09/30/20  4:02 PM   Specimen: In/Out Cath Urine  Result Value Ref Range Status   Specimen Description   Final    IN/OUT CATH URINE Performed at [facility] [address], [place], [place] [address]    Special Requests   Final  NONE Performed at [facility], [place] [address], [place], [place] [address]    Culture MULTIPLE SPECIES PRESENT, SUGGEST RECOLLECTION (A)  Final   Report Status 10/01/2020 FINAL  Final  SARS Coronavirus 2 by RT PCR (hospital order, performed in [facility] hospital lab) Nasopharyngeal Nasopharyngeal Swab     Status: None   Collection Time: 10/02/20  1:44 PM   Specimen: Nasopharyngeal Swab  Result Value Ref Range Status   SARS Coronavirus 2 NEGATIVE NEGATIVE Final    Comment: (NOTE) SARS-CoV-2 target nucleic acids are NOT DETECTED.  The SARS-CoV-2 RNA is generally detectable in upper and lower respiratory specimens during the acute phase of infection. The lowest concentration of SARS-CoV-2 viral copies this assay can detect is 250 copies / mL. A negative result does not preclude SARS-CoV-2 infection and should not be used as the sole basis for treatment or other patient management decisions.  A negative result may occur with improper specimen collection / handling, submission of specimen other than nasopharyngeal swab, presence of viral mutation(s) within the areas targeted by this assay, and inadequate number of viral copies (<250 copies / mL). A negative result must be combined with clinical observations, patient history, and epidemiological information.  Fact Sheet for Patients:    StrictlyIdeas.no  Fact Sheet for Healthcare Providers: BankingDealers.co.za  This test is not yet approved or  cleared by the [place] FDA and has been authorized for detection and/or diagnosis of SARS-CoV-2 by FDA under an Emergency Use Authorization (EUA).  This EUA will remain in effect (meaning this test can be used) for the duration of the COVID-19 declaration under Section 564(b)(1) of the Act, 21 U.S.C. section 360bbb-3(b)(1), unless the authorization is terminated or revoked sooner.  Performed at [facility], [place] [address] [place],  [address]      Labs: BNP (last 3 results) No results for input(s): BNP in the last 8760 hours. Basic Metabolic Panel: Recent Labs  Lab 09/30/20 1550 10/01/20 0529 10/02/20 0748 10/03/20 0712  NA 147* 144 138 140  K 4.1 3.3* 3.1* 3.6  CL 113* 115* 113* 113*  CO2 19* 19* 20* 19*  GLUCOSE 76 102* 98 98  BUN 33* 29* 22 18  CREATININE 1.34* 1.05 1.11 1.05  CALCIUM 9.1 8.3* 8.0* 8.0*  MG  --  2.0 1.7 1.7  PHOS  --  3.1 2.5  --    Liver Function Tests: Recent Labs  Lab 09/30/20 1550 10/01/20 0529 10/02/20 0748  AST 17 12*  --   ALT 17 13  --   ALKPHOS 100 74  --   BILITOT 0.8 0.2*  --   PROT 7.1 5.6*  --   ALBUMIN 3.1* 2.5* 2.3*   No results for input(s): LIPASE, AMYLASE in the last 168 hours. No results for input(s): AMMONIA in the last 168 hours. CBC: Recent Labs  Lab 09/30/20 1550 10/01/20 0529 10/02/20 0748 10/03/20 0712  WBC 10.7* 5.6 8.5 5.5  NEUTROABS 7.3  --  6.5 3.1  HGB 10.6* 8.8* 8.9* 9.1*  HCT 33.2* 27.9* 28.6* 29.1*  MCV 92.0 93.6 95.0 94.2  PLT 349 240 216 211   Cardiac Enzymes: No results for input(s): CKTOTAL, CKMB, CKMBINDEX, TROPONINI in the last 168 hours. BNP: Invalid input(s): POCBNP CBG: No results for input(s): GLUCAP in the last 168 hours. D-Dimer No results for input(s): DDIMER in the last 72 hours. Hgb  A1c No results for input(s): HGBA1C in the last 72 hours. Lipid Profile No  results for input(s): CHOL, HDL, LDLCALC, TRIG, CHOLHDL, LDLDIRECT in the last 72 hours. Thyroid function studies Recent Labs    10/01/20 0529  TSH 20.167*   Anemia work up No results for input(s): VITAMINB12, FOLATE, FERRITIN, TIBC, IRON, RETICCTPCT in the last 72 hours. Urinalysis    Component Value Date/Time   COLORURINE YELLOW 09/30/2020 1602   APPEARANCEUR CLOUDY (A) 09/30/2020 1602   LABSPEC 1.015 09/30/2020 1602   PHURINE 5.0 09/30/2020 1602   GLUCOSEU NEGATIVE 09/30/2020 1602   HGBUR LARGE (A) 09/30/2020 1602   BILIRUBINUR NEGATIVE 09/30/2020 1602   BILIRUBINUR n 11/11/2015 1716   KETONESUR 5 (A) 09/30/2020 1602   PROTEINUR 100 (A) 09/30/2020 1602   UROBILINOGEN 0.2 11/11/2015 1716   UROBILINOGEN 0.2 02/15/2012 1335   NITRITE NEGATIVE 09/30/2020 1602   LEUKOCYTESUR LARGE (A) 09/30/2020 1602   Sepsis Labs Invalid input(s): PROCALCITONIN,  WBC,  LACTICIDVEN Microbiology Recent Results (from the past 240 hour(s))  Resp Panel by RT-PCR (Flu A&B, Covid) Nasopharyngeal Swab     Status: None   Collection Time: 09/30/20  3:30 PM   Specimen: Nasopharyngeal Swab; Nasopharyngeal(NP) swabs in vial transport medium  Result Value Ref Range Status   SARS Coronavirus 2 by RT PCR NEGATIVE NEGATIVE Final    Comment: (NOTE) SARS-CoV-2 target nucleic acids are NOT DETECTED.  The SARS-CoV-2 RNA is generally detectable in upper respiratory specimens during the acute phase of infection. The lowest concentration of SARS-CoV-2 viral copies this assay can detect is 138 copies/mL. A negative result does not preclude SARS-Cov-2 infection and should not be used as the sole basis for treatment or other patient management decisions. A negative result may occur with  improper specimen collection/handling, submission of specimen other than nasopharyngeal swab, presence of viral mutation(s) within the areas targeted by  this assay, and inadequate number of viral copies(<138 copies/mL). A negative result must be combined with clinical observations, patient history, and epidemiological information. The expected result is Negative.  Fact Sheet for Patients:  EntrepreneurPulse.com.au  Fact Sheet for Healthcare Providers:  IncredibleEmployment.be  This test is no t yet approved or cleared by the [place] FDA and  has been authorized for detection and/or diagnosis of SARS-CoV-2 by FDA under an Emergency Use Authorization (EUA). This EUA will remain  in effect (meaning this test can be used) for the duration of the COVID-19 declaration under Section 564(b)(1) of the Act, 21 U.S.C.section 360bbb-3(b)(1), unless the authorization is terminated  or revoked sooner.       Influenza A by PCR NEGATIVE NEGATIVE Final   Influenza B by PCR NEGATIVE NEGATIVE Final    Comment: (NOTE) The Xpert Xpress SARS-CoV-2/FLU/RSV plus assay is intended as an aid in the diagnosis of influenza from Nasopharyngeal swab specimens and should not be used as a sole basis for treatment. Nasal washings and aspirates are unacceptable for Xpert Xpress SARS-CoV-2/FLU/RSV testing.  Fact Sheet for Patients: EntrepreneurPulse.com.au  Fact Sheet for Healthcare Providers: IncredibleEmployment.be  This test is not yet approved or cleared by the [place] FDA and has been authorized for detection and/or diagnosis of SARS-CoV-2 by FDA under an Emergency Use Authorization (EUA). This EUA will remain in effect (meaning this test can be used) for the duration of the COVID-19 declaration under Section 564(b)(1) of the Act, 21 U.S.C. section 360bbb-3(b)(1), unless the authorization is terminated or revoked.  Performed at [facility], [place] [address] [place], [place] [address]   Blood Culture (routine x 2)     Status: None (  Preliminary result)    Collection Time: 09/30/20  3:50 PM   Specimen: BLOOD RIGHT FOREARM  Result Value Ref Range Status   Specimen Description   Final    BLOOD RIGHT FOREARM Performed at [facility] [address] [place], [place] [address]    Special Requests   Final    BOTTLES DRAWN AEROBIC AND ANAEROBIC Blood Culture adequate volume Performed at [facility] [address], [place], [place] [address]    Culture   Final    NO GROWTH 2 DAYS Performed at [facility] [address], [place], [place] [address]    Report Status PENDING  Incomplete  Blood Culture (routine x 2)     Status: None (Preliminary result)   Collection Time: 09/30/20  3:50 PM   Specimen: BLOOD  Result Value Ref Range Status   Specimen Description   Final    BLOOD LEFT ANTECUBITAL Performed at [facility] [address], [place], [place] [address]    Special Requests   Final    BOTTLES DRAWN AEROBIC AND ANAEROBIC Blood Culture adequate volume Performed at [facility] [address], [place], [place] [address]    Culture   Final    NO GROWTH 2 DAYS Performed at [facility] [address], [place], [place] [address]    Report Status PENDING  Incomplete  Urine culture     Status: Abnormal   Collection Time: 09/30/20  4:02 PM   Specimen: In/Out Cath Urine  Result Value Ref Range Status   Specimen Description   Final    IN/OUT CATH URINE Performed at [facility] [address], [place], [place] [address]    Special Requests   Final    NONE Performed at [facility], [place] [address] [place], [place] [address]    Culture MULTIPLE SPECIES PRESENT, SUGGEST RECOLLECTION (A)  Final   Report Status 10/01/2020 FINAL  Final  SARS Coronavirus 2 by RT PCR (hospital order, performed in [facility] hospital lab) Nasopharyngeal Nasopharyngeal Swab     Status: None   Collection Time: 10/02/20  1:44  PM   Specimen: Nasopharyngeal Swab  Result Value Ref Range Status   SARS Coronavirus 2 NEGATIVE NEGATIVE Final    Comment: (NOTE) SARS-CoV-2 target nucleic acids are NOT DETECTED.  The SARS-CoV-2 RNA is generally detectable in upper and lower respiratory specimens during the acute phase of infection. The lowest concentration of SARS-CoV-2 viral copies this assay can detect is 250 copies / mL. A negative result does not preclude SARS-CoV-2 infection and should not be used as the sole basis for treatment or other patient management decisions.  A negative result may occur with improper specimen collection / handling, submission of specimen other than nasopharyngeal swab, presence of viral mutation(s) within the areas targeted by this assay, and inadequate number of viral copies (<250 copies / mL). A negative result must be combined with clinical observations, patient history, and epidemiological information.  Fact Sheet for Patients:   StrictlyIdeas.no  Fact Sheet for Healthcare Providers: BankingDealers.co.za  This test is not yet approved or  cleared by the [place] FDA and has been authorized for detection and/or diagnosis of SARS-CoV-2 by FDA under an Emergency Use Authorization (EUA).  This EUA will remain in effect (meaning this test can be used) for the duration of the COVID-19 declaration under Section 564(b)(1) of the Act, 21 U.S.C. section 360bbb-3(b)(1), unless the  authorization is terminated or revoked sooner.  Performed at [facility], [place] [address], [place], [place] [address]      Time coordinating discharge: 35 minutes  SIGNED:   Aline August, MD  [facility] 10/03/2020, 9:44 AM

## 2020-10-03 NOTE — NC FL2 (Signed)
[place] LEVEL OF CARE SCREENING TOOL     IDENTIFICATION  Patient Name: Wayne [name] Birthdate: 12/25/1925 Sex: male Admission Date (Current Location): 09/30/2020  [facility] and [place] Number:  Herbalist and Address:  [facility],  [place] [address] [place]      Provider Number: [id number]  Attending Physician Name and Address:  Aline August, MD  Relative Name and Phone Number:       Current Level of Care: Hospital Recommended Level of Care: [place] Prior Approval Number:    Date Approved/Denied:   PASRR Number: [id number]  Discharge Plan: SNF    Current Diagnoses: Patient Active Problem List   Diagnosis Date Noted  . Acute metabolic encephalopathy 62/70/3500  . Dehydration 09/30/2020  . Hypernatremia 09/30/2020  . Scrotal infection   . DNR (do not resuscitate)   . DNI (do not intubate)   . Goals of care, counseling/discussion   . Palliative care by specialist   . Pressure injury of skin 07/09/2020  . Cellulitis of scrotum 07/08/2020  . Dementia ([place])   . Stercoral colitis   . Obstructive uropathy   . Bilateral hydronephrosis   . Sepsis due to undetermined organism ([place])   . AKI (acute kidney injury) ([place])   . Acute UTI (urinary tract infection)   . Hyperkalemia   . Hip fracture, unspecified laterality, closed, initial encounter ([place]) 10/11/2018  . Hip fracture ([place]) 10/11/2018  . Closed intertrochanteric fracture of left femur ([place]) 10/11/2018  . Viral URI with cough 10/20/2017  . Pneumonia of left lower lobe due to infectious organism 10/20/2017  . Renal insufficiency 01/25/2012  . Insomnia 12/21/2011  . Venous insufficiency 08/24/2011  . ALLERGIC RHINITIS 12/10/2008  . DERMATITIS, CONTACT, NOS 08/11/2007  . Short-term memory loss 08/02/2007  . Hypothyroidism 04/07/2007  . Hyperlipidemia 04/07/2007  . Essential hypertension 04/07/2007    Orientation RESPIRATION BLADDER Height &  Weight      (Disoriented x 4)  Normal Indwelling catheter (Chronic foley) Weight: 52.6 kg Height:  5\' 8"  (172.7 cm)  BEHAVIORAL SYMPTOMS/MOOD NEUROLOGICAL BOWEL NUTRITION STATUS      Incontinent  (Speech recommending Nectar thick liquid.)  AMBULATORY STATUS COMMUNICATION OF NEEDS Skin   Extensive Assist Verbally  (necrotizing fasciitis to scrotal area.)                       Personal Care Assistance Level of Assistance  Bathing,Feeding,Dressing Bathing Assistance: Maximum assistance Feeding assistance: Maximum assistance Dressing Assistance: Maximum assistance     Functional Limitations Info             SPECIAL CARE FACTORS FREQUENCY                       Contractures      Additional Factors Info  Code Status,Allergies Code Status Info: DNR Allergies Info: Doxazosin Mesylate, Propoxyphene Hcl, Penicillins           Current Medications (10/03/2020):  This is the current hospital active medication list Current Facility-Administered Medications  Medication Dose Route Frequency Provider Last Rate Last Admin  . acetaminophen (TYLENOL) tablet 650 mg  650 mg Oral Q6H PRN Eugenie Filler, MD       Or  . acetaminophen (TYLENOL) suppository 650 mg  650 mg Rectal Q6H PRN Eugenie Filler, MD      . albuterol (PROVENTIL) (2.5 MG/3ML) 0.083% nebulizer solution 2.5 mg  2.5 mg Inhalation TID  Eugenie Filler, MD   2.5 mg at 10/03/20 1914  . cefTRIAXone (ROCEPHIN) 2 g in sodium chloride 0.9 % 100 mL IVPB  2 g Intravenous Q24H Eugenie Filler, MD 200 mL/hr at 10/02/20 1747 2 g at 10/02/20 1747  . Chlorhexidine Gluconate Cloth 2 % PADS 6 each  6 each Topical Daily Eugenie Filler, MD   6 each at 10/02/20 [phone]  . collagenase (SANTYL) ointment 1 application  1 application Topical Daily Eugenie Filler, MD   1 application at 56/21/30 [phone]  . enoxaparin (LOVENOX) injection 40 mg  40 mg Subcutaneous Q24H Starla Link, Kshitiz, MD   40 mg at 10/02/20 1336  . feeding  supplement (PRO-STAT SUGAR FREE 64) liquid 30 mL  30 mL Oral BID Eugenie Filler, MD   30 mL at 10/02/20 2222  . haloperidol (HALDOL) tablet 2.5 mg  2.5 mg Oral QHS Eugenie Filler, MD   2.5 mg at 10/02/20 2218  . hydrALAZINE (APRESOLINE) injection 5 mg  5 mg Intravenous Q6H PRN Eugenie Filler, MD      . iron polysaccharides (NIFEREX) capsule 150 mg  150 mg Oral Daily Eugenie Filler, MD   150 mg at 10/02/20 8469  . levothyroxine (SYNTHROID) tablet 75 mcg  75 mcg Oral Q0600 Aline August, MD   75 mcg at 10/03/20 [phone]  . lip balm (CARMEX) ointment 1 application  1 application Topical PRN Eugenie Filler, MD      . liver oil-zinc oxide (DESITIN) 40 % ointment   Topical TID Eugenie Filler, MD   Given at 10/02/20 2223  . multivitamin liquid 15 mL  15 mL Oral Daily Eugenie Filler, MD   15 mL at 10/02/20 0954  . ondansetron (ZOFRAN) tablet 4 mg  4 mg Oral Q6H PRN Eugenie Filler, MD       Or  . ondansetron [facility]) injection 4 mg  4 mg Intravenous Q6H PRN Eugenie Filler, MD      . phenol (CHLORASEPTIC) mouth spray 1 spray  1 spray Mouth/Throat PRN Starla Link, Kshitiz, MD      . polyethylene glycol (MIRALAX / GLYCOLAX) packet 17 g  17 g Oral Daily PRN Eugenie Filler, MD      . Resource ThickenUp Clear   Oral PRN Narda Rutherford, CCC-SLP      . senna (SENOKOT) tablet 8.6 mg  1 tablet Oral QHS Eugenie Filler, MD   8.6 mg at 10/02/20 2218  . senna-docusate (Senokot-S) tablet 1 tablet  1 tablet Oral QHS PRN Eugenie Filler, MD      . sertraline (ZOLOFT) tablet 75 mg  75 mg Oral Daily Eugenie Filler, MD   75 mg at 10/02/20 0949  . sodium chloride flush (NS) 0.9 % injection 3 mL  3 mL Intravenous Q12H Eugenie Filler, MD   3 mL at 10/02/20 0954  . traMADol (ULTRAM) tablet 50 mg  50 mg Oral TID PRN Eugenie Filler, MD   50 mg at 10/02/20 2218     Discharge Medications: Please see discharge summary for a list of discharge medications.  Relevant Imaging  Results:  Relevant Lab Results:   Additional Information [id number]  Skai Lickteig, Marjie Skiff, RN

## 2020-10-03 NOTE — Progress Notes (Signed)
[facility])  Wayne [name] is our current palliative pt in the community.  Noted that he will be discharging today.  Our palliative team will f/u with him once he is back home.  Venia Carbon RN, BSN, [facility] Liaison

## 2020-10-03 NOTE — Consult Note (Signed)
Consultation Note Date: 10/03/2020   Patient Name: Wayne [name]  DOB: 05/15/1926  MRN: [id number]  Age / Sex: 86 y.o., male  PCP: Patient, No Pcp Per Referring Physician: Aline August, MD  Reason for Consultation: Establishing goals of care and Psychosocial/spiritual support  HPI/Patient Profile: 86 y.o. male  with past medical history of protein calorie malnutrition, mild cognitive deficits, chronic diastolic CHF, recently hospitalized 07/08/2020-07/29/2020 for 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He was seen by PMT on that admission  On current admission, he was noted to be significantly dehydrated, hyponatremic and had UTI. He was started on IV fluids and antibiotics.  During the hospitalization, wound culture grew multiple species.  His mental status has improved and possibly is back to baseline.  He is currently hemodynamically stable.  He will be discharged back to SNF on oral Keflex.    Patient and family face ongoing treatment option decisions, advanced directive decisions and anticipatory care needs.  Patient is high risk for decompensation 2/2 to multiple co-morbidities   Clinical Assessment and Goals of Care:  This NP Wadie Lessen reviewed medical records, received report from team, assessed the patient and then meet at the patient's bedside along with his son to discuss diagnosis, prognosis, GOC, EOL wishes disposition and options.   Concept of Palliative Care was introduced as specialized medical care for people and their families living with serious illness.  If focuses on providing  relief from the symptoms and stress of a serious illness.  The goal is to improve quality of life for both the patient and the family.  Created space and opportunity for patient  and family to explore thoughts and feelings regarding current medical information     A  discussion was had today regarding advanced directives.  Concepts specific to code status, artifical feeding and hydration, continued IV antibiotics and rehospitalization was had.  The difference between a aggressive medical intervention path  and a palliative comfort care path for this patient at this time was had.  Values and goals of care important to patient and family were attempted to be elicited.   MOST form introduced, not completed.  Son did bring in ACP documenters, copied and placed in hard chart.   Questions and concerns addressed.  Patient  encouraged to call with questions or concerns.     PMT will continue to support holistically.      HCPOA/son    SUMMARY OF RECOMMENDATIONS    Code Status/Advance Care Planning:  DNR   Palliative Prophylaxis:   Aspiration, Bowel Regimen, Delirium Protocol, Frequent Pain Assessment and Oral Care  Additional Recommendations (Limitations, Scope, Preferences):  Avoid Hospitalization  Psycho-social/Spiritual:   Desire for further Chaplaincy support:no  Additional Recommendations: Education on Hospice  Prognosis:   Unable to determine, high risk to decompensate   Discharge Planning: [place] for rehab with Palliative care service follow-up      Primary Diagnoses: Present on Admission: . Acute metabolic encephalopathy . Hypothyroidism . Hyperlipidemia . Short-term memory loss . Essential hypertension . Dementia ([place]) . Acute UTI (urinary tract infection) . Pressure injury of skin . DNR (do not resuscitate) . Dehydration . Hypernatremia . AKI (acute kidney injury) ([place])   I have reviewed the medical record, interviewed the patient and  family, and examined the patient. The following aspects are pertinent.  Past Medical History:  Diagnosis Date  . Anxiety    takes Atrivan daily  . Back pain    chronic with neck pain  . Cancer ([place])    skin  . CHF (congestive heart failure) ([place])   . Constipation    related to medication  . Dementia ([place])    takes Aricept nightly  . Enlarged prostate    self caths once every 1-2wks  . GERD (gastroesophageal reflux disease)    doesn't require medication  . Hx of seasonal allergies   . Hyperlipidemia    takes Simvastatin daily  . Hypertension    takes atenolol daily  . Hypothyroidism    takes Synthroid daily  . Insomnia    related to pain  . MRSA (methicillin resistant staph aureus) culture positive    Per patient tested on 10/07/11.  . Osteoporosis   . Peripheral edema    takes Torsemide daily  . Scoliosis    Social History   Socioeconomic History  . Marital status: Married    Spouse name: Not on file  . Number of children: 4  . Years of education: Not on file  . Highest education level: Not on file  Occupational History  . Not on file  Tobacco Use  . Smoking status: Never Smoker  . Smokeless tobacco: Never Used  Vaping Use  . Vaping Use: Never used  Substance and Sexual Activity  . Alcohol use: No  . Drug use: No  . Sexual activity: Not Currently  Other Topics Concern  . Not on file  Social History Narrative   Lives at home with wife.   Social Determinants of Health   Financial Resource Strain: Not on file  Food Insecurity: Not on file  Transportation Needs: Not on file  Physical Activity: Not on file  Stress: Not on file  Social Connections: Not on file   Family History  Problem Relation Age of Onset  . Heart disease Mother 84       Vague history  . Stroke Father   . Hypertension Other        Multiple family members both sides  . Diabetes Other   . Anesthesia problems Neg Hx   . Hypotension Neg Hx   . Malignant hyperthermia Neg Hx   .  Pseudochol deficiency Neg Hx    Scheduled Meds: . albuterol  2.5 mg Inhalation TID  . Chlorhexidine Gluconate Cloth  6 each Topical Daily  . collagenase  1 application Topical Daily  . enoxaparin (LOVENOX) injection  40 mg Subcutaneous Q24H  . feeding supplement (PRO-STAT SUGAR FREE 64)  30 mL Oral BID  . haloperidol  2.5 mg Oral QHS  . iron polysaccharides  150 mg Oral Daily  . levothyroxine  75 mcg Oral Q0600  . liver oil-zinc oxide   Topical TID  . multivitamin  15 mL Oral Daily  . senna  1 tablet Oral QHS  . sertraline  75 mg Oral Daily  . sodium chloride flush  3 mL Intravenous Q12H   Continuous Infusions: . cefTRIAXone (ROCEPHIN)  IV 2 g (10/02/20 1747)   PRN Meds:.acetaminophen **OR** acetaminophen, hydrALAZINE, lip balm, ondansetron **OR** ondansetron (ZOFRAN) IV, phenol, polyethylene glycol, Resource ThickenUp Clear, senna-docusate, traMADol Medications Prior to Admission:  Prior to Admission medications   Medication Sig Start Date End Date Taking? Authorizing Provider  Albuterol Sulfate 2.5 MG/0.5ML NEBU Inhale 2.5 mg into the lungs 3 (three) times daily.   Yes [provider]  Amino Acids-Protein Hydrolys (FEEDING SUPPLEMENT, PRO-STAT SUGAR FREE 64,) LIQD Take 30 mLs by mouth in the morning and at bedtime.   Yes [provider]  apixaban (ELIQUIS) 2.5 MG TABS tablet Take 2.5 mg by mouth 2 (two) times daily.   Yes [provider]  Ascorbic Acid (VITAMIN C) 1000 MG tablet Take 1,000 mg by mouth in the morning and at bedtime.   Yes [provider]  collagenase (SANTYL) ointment Apply 1 application topically daily.   Yes [provider]  ferrous sulfate 325 (65 FE) MG tablet Take 1 tablet (325 mg total) by mouth daily with breakfast. 07/20/20  Yes Regalado, Belkys A, MD  haloperidol (HALDOL) 5 MG tablet Take 2.5 mg by mouth at bedtime.   Yes [provider]  haloperidol lactate (HALDOL) 5 MG/ML injection Inject 2 mg into the  muscle once.   Yes [provider]  HYDROcodone-acetaminophen (NORCO/VICODIN) 5-325 MG tablet Take 1 tablet by mouth 2 (two) times daily.   Yes [provider]  Infant Care Products (DERMACLOUD) CREA Apply 1 application topically in the morning, at noon, and at bedtime. To buttocks   Yes [provider]  iron polysaccharides (NIFEREX) 150 MG capsule Take 150 mg by mouth daily.   Yes [provider]  lactose free nutrition (BOOST PLUS) LIQD Take 237 mLs by mouth 3 (three) times daily with meals.   Yes [provider]  mirtazapine (REMERON) 15 MG tablet Take 15 mg by mouth at bedtime. 06/11/20  Yes [provider]  NON FORMULARY Inject 1 each into the skin in the morning, at noon, and at bedtime. Clysis NS @60  ml /hr for 7 days for dehydration   Yes [provider]  polyethylene glycol (MIRALAX / GLYCOLAX) packet Take 17 g by mouth daily as needed for mild constipation. Patient taking differently: Take 17 g by mouth daily. 10/15/18  Yes Shelly Coss, MD  promethazine (PHENERGAN) 25 MG tablet Take 25 mg by mouth 2 (two) times daily as needed for nausea or vomiting.   Yes [provider]  senna-docusate (SENOKOT-S) 8.6-50 MG tablet Take 1 tablet by mouth at bedtime as needed for mild constipation. Patient taking differently: Take 1 tablet by mouth daily. 10/15/18  Yes Shelly Coss, MD  sertraline (ZOLOFT) 25 MG tablet Take 75 mg by mouth daily.  06/25/20  Yes [provider]  simvastatin (ZOCOR) 40 MG tablet TAKE 1 TABLET BY MOUTH EVERY DAY AT NIGHT Patient taking differently: Take 40 mg by mouth daily. 06/08/19  Yes Isaac Bliss, Rayford Halsted, MD  sulfamethoxazole-trimethoprim (BACTRIM DS) 800-160 MG tablet Take 1 tablet by mouth 2 (two) times daily.   Yes [provider]  Zinc Sulfate 220 (50 Zn) MG TABS Take 220 mg by mouth daily.   Yes [provider]  cephALEXin (KEFLEX) 500 MG capsule Take 1 capsule  (500 mg total) by mouth 3 (three)  times daily for 4 days. 10/03/20 10/07/20  Aline August, MD  levothyroxine (SYNTHROID) 50 MCG tablet Take 1.5 tablets (75 mcg total) by mouth daily before breakfast. 10/03/20   Aline August, MD  liver oil-zinc oxide (DESITIN) 40 % ointment Apply topically 3 (three) times daily. Patient not taking: Reported on 09/30/2020 07/19/20   Regalado, Jerald Kief A, MD  traMADol (ULTRAM) 50 MG tablet Take 1 tablet (50 mg total) by mouth 3 (three) times daily as needed for moderate pain or severe pain. 10/03/20   Aline August, MD   Allergies  Allergen Reactions  . Doxazosin Mesylate Other (See Comments)    Makes blood pressure  . Propoxyphene Hcl Other (See Comments)    REACTION: upset stomach  . Penicillins Rash    Tolerates cephalosporins including cefazolin  DID THE REACTION INVOLVE: Swelling of the face/tongue/throat, SOB, or low BP? No Sudden or severe rash/hives, skin peeling, or the inside of the mouth or nose? Yes Did it require medical treatment? Yes When did it last happen?50 yrs ago If all above answers are "NO", may proceed with cephalosporin use.    Review of Systems  Neurological: Positive for weakness.    Physical Exam Constitutional:      Appearance: He is underweight. He is ill-appearing.  Cardiovascular:     Rate and Rhythm: Normal rate.  Musculoskeletal:     Comments: Generalized weakness and muscle atrophy  Skin:    General: Skin is warm and dry.  Neurological:     Mental Status: He is alert.     Vital Signs: BP (!) 145/84 (BP Location: Left Arm)   Pulse 75   Temp 98.1 F (36.7 C) (Oral)   Resp 17   Ht 5\' 8"  (1.727 m)   Wt 52.6 kg   SpO2 93%   BMI 17.63 kg/m  Pain Scale: 0-10 POSS *See Group Information*: S-Acceptable,Sleep, easy to arouse Pain Score: 0-No pain   SpO2: SpO2: 93 % O2 Device:SpO2: 93 % O2 Flow Rate: .   IO: Intake/output summary:   Intake/Output Summary (Last 24 hours) at 10/03/2020 1011 Last  data filed at 10/03/2020 7793 Gross per 24 hour  Intake 200 ml  Output 801 ml  Net -601 ml    LBM: Last BM Date: 10/02/20 Baseline Weight: Weight: 54.4 kg Most recent weight: Weight: 52.6 kg     Palliative Assessment/Data:  30 % at best   Discussed with Dr Starla Link  Time In: 1000 Time Out: 1115 Time Total: 70 minutes Greater than 50%  of this time was spent counseling and coordinating care related to the above assessment and plan.  Signed by: Wadie Lessen, NP   Please contact Palliative Medicine Team phone at [phone] for questions and concerns.  For individual provider: See Shea Evans

## 2020-10-05 LAB — CULTURE, BLOOD (ROUTINE X 2)
Culture: NO GROWTH
Culture: NO GROWTH
Special Requests: ADEQUATE
Special Requests: ADEQUATE

## 2020-10-09 DIAGNOSIS — L89100 Pressure ulcer of unspecified part of back, unstageable: Secondary | ICD-10-CM | POA: Diagnosis not present

## 2020-10-10 DIAGNOSIS — Z79899 Other long term (current) drug therapy: Secondary | ICD-10-CM | POA: Diagnosis not present

## 2020-10-11 ENCOUNTER — Non-Acute Institutional Stay: Payer: Medicare Other | Admitting: Hospice

## 2020-10-11 ENCOUNTER — Other Ambulatory Visit: Payer: Self-pay

## 2020-10-11 DIAGNOSIS — F0390 Unspecified dementia without behavioral disturbance: Secondary | ICD-10-CM

## 2020-10-11 DIAGNOSIS — Z515 Encounter for palliative care: Secondary | ICD-10-CM

## 2020-10-11 NOTE — Progress Notes (Signed)
PATIENT NAME: Wayne [name] [id number] [phone] (home)  DOB: 1926-04-12 MRN: [id number]  PRIMARY CARE PROVIDER:    Dr. Leanna [name]  REFERRING PROVIDER:   Dr. Leanna [name]  RESPONSIBLE PARTY:   Extended Emergency Contact Information Primary Emergency Contact: Wayne [name] Mobile Phone: [phone] Relation: Son Secondary Emergency Contact: Wayne [name] Mobile Phone: [phone] Relation: Son Interpreter needed? No  I met face to face with patient and family in home/facility.  ADVANCE CARE PLANNING/RECOMMENDATIONS/PLAN:    Visit at the request of Dr. Leanna [name]  for palliative consult. Visit consisted of building trust and discussions on Palliative Medicine as specialized medical care for people living with serious illness, aimed at facilitating better quality of life through symptoms relief, assisting with advance care plan and establishing complex decision making.  Patient endorsed palliative service  Discussion on the difference between Palliative and Hospice care. Palliative care and hospice have similar goals of managing symptoms, promoting comfort, improving quality of life, and maintaining a person's dignity. However, palliative care may be offered during any phase of a serious illness, while hospice care is usually offered when a person is expected to live for 6 months or less.  Visit consisted of counseling and education dealing with the complex and emotionally intense issues of symptom management and palliative care in the setting of serious and potentially life-threatening illness. NP called son-David and left him a voicemail with callback number.  Palliative care team will continue to support patient, patient's family, and medical team. NP also discussed with facility assistant director of nursing prospect of patient eligibility for hospice service in the near future.  She said facility was on board and social worker  will follow up with family  Advance Care Planning: Our advance care planning conversation included a discussion about:    The value and importance of advance care planning  Exploration of goals of care in the event of a sudden injury or illness  Identification and preparation of a healthcare agent          Review and updating or creation of an advance directive document         Discussion on decision not to resuscitate or to de-escalate disease focused treatments due to poor   prognosis.   CODE STATUS: CODE STATUS reviewed.  Patient is a DO NOT RESUSCITATE.  Signed DNR in facility record and in epic.    GOALS OF CARE: Goals of care include to maximize quality of life and symptom management.  Follow up Palliative Care Visit: Palliative care will continue to follow for goals of care clarification and symptom management.   Functional Decline/Symptom Management:  Chart review in Epic - patient with recent hospitalization 12/20-12/23/21 for acute metabolic encephalopathy likely related to UTI. He was treated and discharged to SNF with Keflex which he has completed.  Memory loss/confusion related to Dementia is ongoing and at baseline. Patient is bedbound, incontinent of bowel and bladder, total care, hoyer lift for all transfers, FLACC 0 FAST 7b hello hey Wayne [name] of care yes please fast affect up care update today Appetite is good to fair, occasional cough especially when swallowing per primary nurse - Wayne [name]. She reports ST referral in process  Pain: In mid back is chronic, Continue Tramadol as ordered.  Appetite is fair to good. Continue Mirtazapine, Prostat Palliative will continue to monitor for symptom management/decline and make recommendations as needed.   Family /Caregiver/Community Supports:  Patient lives in [place] for ongoing care  I  spent one hour and 20 minutes providing this initial consultation; time includes time spent with patient/family, chart review, provider coordination,  and  documentation. More than 50% of the time in this consultation was spent on counseling patient and coordinating communication.   CHIEF COMPLAIN/HISTORY OF PRESENT ILLNESS:  Wayne [name] is a 86 y.o. male with multiple medical problems including CHF, Dementia. History obtained from review of EMR, discussion with primary nurse, patient.   Palliative Care was asked to follow this patient by consultation request of Dr. Leanna [name]  to help address advance care planning and complex decision making. Thank you for the opportunity to participate in the care of Wayne [name]: DNR  PPS: 30%  HOSPICE ELIGIBILITY/DIAGNOSIS: TBD  PAST MEDICAL HISTORY:  Past Medical History:  Diagnosis Date  . Anxiety    takes Atrivan daily  . Back pain    chronic with neck pain  . Cancer ([place])    skin  . CHF (congestive heart failure) ([place])   . Constipation    related to medication  . Dementia ([place])    takes Aricept nightly  . Enlarged prostate    self caths once every 1-2wks  . GERD (gastroesophageal reflux disease)    doesn't require medication  . Hx of seasonal allergies   . Hyperlipidemia    takes Simvastatin daily  . Hypertension    takes atenolol daily  . Hypothyroidism    takes Synthroid daily  . Insomnia    related to pain  . MRSA (methicillin resistant staph aureus) culture positive    Per patient tested on 10/07/11.  . Osteoporosis   . Peripheral edema    takes Torsemide daily  . Scoliosis     SOCIAL HX:  Social History   Tobacco Use  . Smoking status: Never Smoker  . Smokeless tobacco: Never Used  Substance Use Topics  . Alcohol use: No   FAMILY HX:  Family History  Problem Relation Age of Onset  . Heart disease Mother 67       Vague history  . Stroke Father   . Hypertension Other        Multiple family members both sides  . Diabetes Other   . Anesthesia problems Neg Hx   . Hypotension Neg Hx   . Malignant hyperthermia Neg Hx   . Pseudochol  deficiency Neg Hx      Labs:   No results for input(s): WBC, HGB, HCT, PLT, MCV in the last 168 hours. No results for input(s): NA, K, CL, CO2, BUN, CREATININE, GLUCOSE in the last 168 hours.  ALLERGIES:  Allergies  Allergen Reactions  . Doxazosin Mesylate Other (See Comments)    Makes blood pressure  . Propoxyphene Hcl Other (See Comments)    REACTION: upset stomach  . Penicillins Rash    Tolerates cephalosporins including cefazolin  DID THE REACTION INVOLVE: Swelling of the face/tongue/throat, SOB, or low BP? No Sudden or severe rash/hives, skin peeling, or the inside of the mouth or nose? Yes Did it require medical treatment? Yes When did it last happen?50 yrs ago If all above answers are "NO", may proceed with cephalosporin use.      PERTINENT MEDICATIONS:  Outpatient Encounter Medications as of 10/11/2020  Medication Sig  . Albuterol Sulfate 2.5 MG/0.5ML NEBU Inhale 2.5 mg into the lungs 3 (three) times daily.  . Amino Acids-Protein Hydrolys (FEEDING SUPPLEMENT, PRO-STAT SUGAR FREE 64,) LIQD Take 30 mLs by mouth in the morning  and at bedtime.  . Ascorbic Acid (VITAMIN C) 1000 MG tablet Take 1,000 mg by mouth in the morning and at bedtime.  . collagenase (SANTYL) ointment Apply 1 application topically daily.  . ferrous sulfate 325 (65 FE) MG tablet Take 1 tablet (325 mg total) by mouth daily with breakfast.  . haloperidol (HALDOL) 5 MG tablet Take 2.5 mg by mouth at bedtime.  . Infant Care Products (DERMACLOUD) CREA Apply 1 application topically in the morning, at noon, and at bedtime. To buttocks  . iron polysaccharides (NIFEREX) 150 MG capsule Take 150 mg by mouth daily.  Marland Kitchen lactose free nutrition (BOOST PLUS) LIQD Take 237 mLs by mouth 3 (three) times daily with meals.  Marland Kitchen levothyroxine (SYNTHROID) 50 MCG tablet Take 1.5 tablets (75 mcg total) by mouth daily before breakfast.  . liver oil-zinc oxide (DESITIN) 40 % ointment Apply topically 3 (three) times daily.  (Patient not taking: Reported on 09/30/2020)  . mirtazapine (REMERON) 15 MG tablet Take 15 mg by mouth at bedtime.  . polyethylene glycol (MIRALAX / GLYCOLAX) packet Take 17 g by mouth daily as needed for mild constipation. (Patient taking differently: Take 17 g by mouth daily.)  . promethazine (PHENERGAN) 25 MG tablet Take 25 mg by mouth 2 (two) times daily as needed for nausea or vomiting.  . senna-docusate (SENOKOT-S) 8.6-50 MG tablet Take 1 tablet by mouth at bedtime as needed for mild constipation. (Patient taking differently: Take 1 tablet by mouth daily.)  . sertraline (ZOLOFT) 25 MG tablet Take 75 mg by mouth daily.   . simvastatin (ZOCOR) 40 MG tablet TAKE 1 TABLET BY MOUTH EVERY DAY AT NIGHT (Patient taking differently: Take 40 mg by mouth daily.)  . traMADol (ULTRAM) 50 MG tablet Take 1 tablet (50 mg total) by mouth 3 (three) times daily as needed for moderate pain or severe pain.  . Zinc Sulfate 220 (50 Zn) MG TABS Take 220 mg by mouth daily.   No facility-administered encounter medications on file as of 10/11/2020.    PHYSICAL EXAM/ROS:  General: NAD, cooperative Cardiovascular: regular rate and rhythm; denies chest pain Pulmonary: clear ant /post fields Abdomen: soft, nontender, + bowel sounds GU: no suprapubic tenderness; Foley cath in place, patent Extremities: no edema Skin: no rashes to visible skin Neurological: Weakness but otherwise nonfocal  Note: Portions of this note were generated with [employer]. Dictation errors may occur despite best attempts at proofreading.  Teodoro Spray, NP

## 2020-10-17 ENCOUNTER — Ambulatory Visit: Payer: Medicare Other | Admitting: Plastic Surgery

## 2020-11-12 DEATH — deceased

## 2021-03-23 IMAGING — DX DG CHEST 2V
2 series · 2 of 2 positions shown · non-contrast
Comparison: Single-view of the chest 10/11/2018.

CLINICAL DATA: Cough for 2 weeks.

EXAM:
CHEST - 2 VIEW

[chest lat]
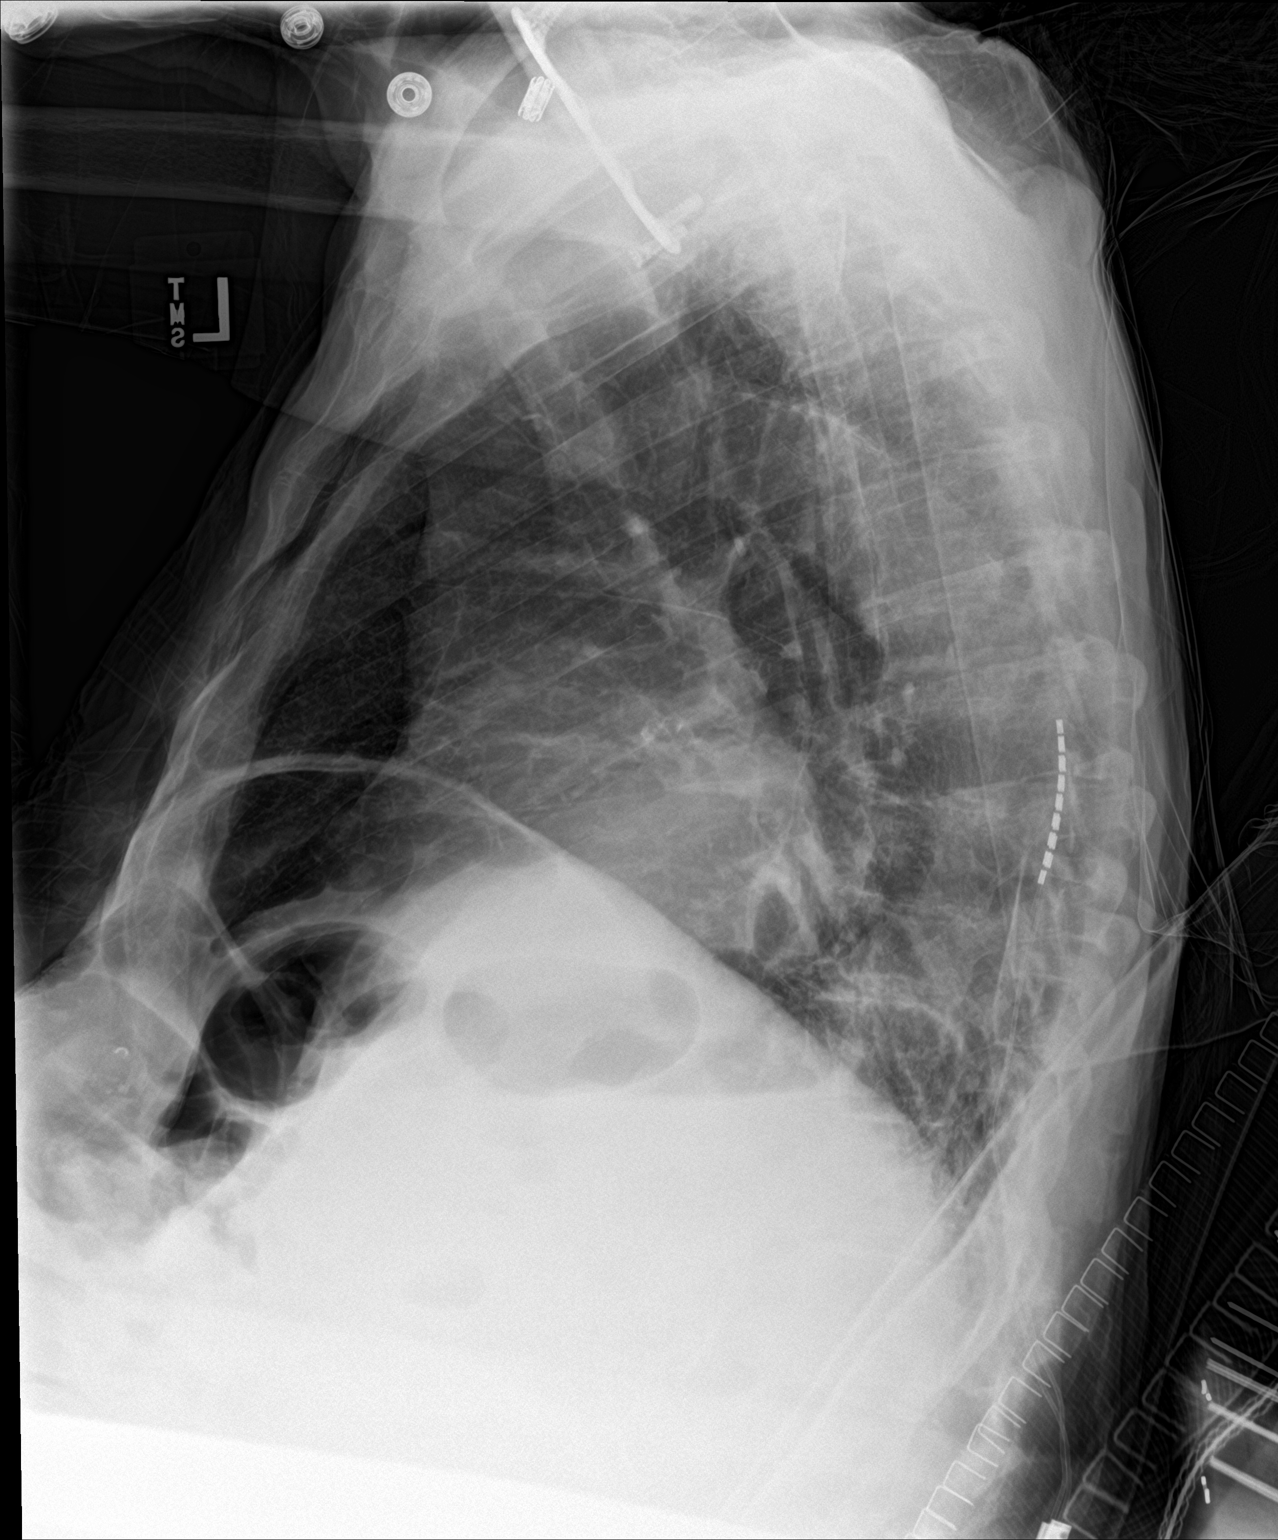

[chest ap]
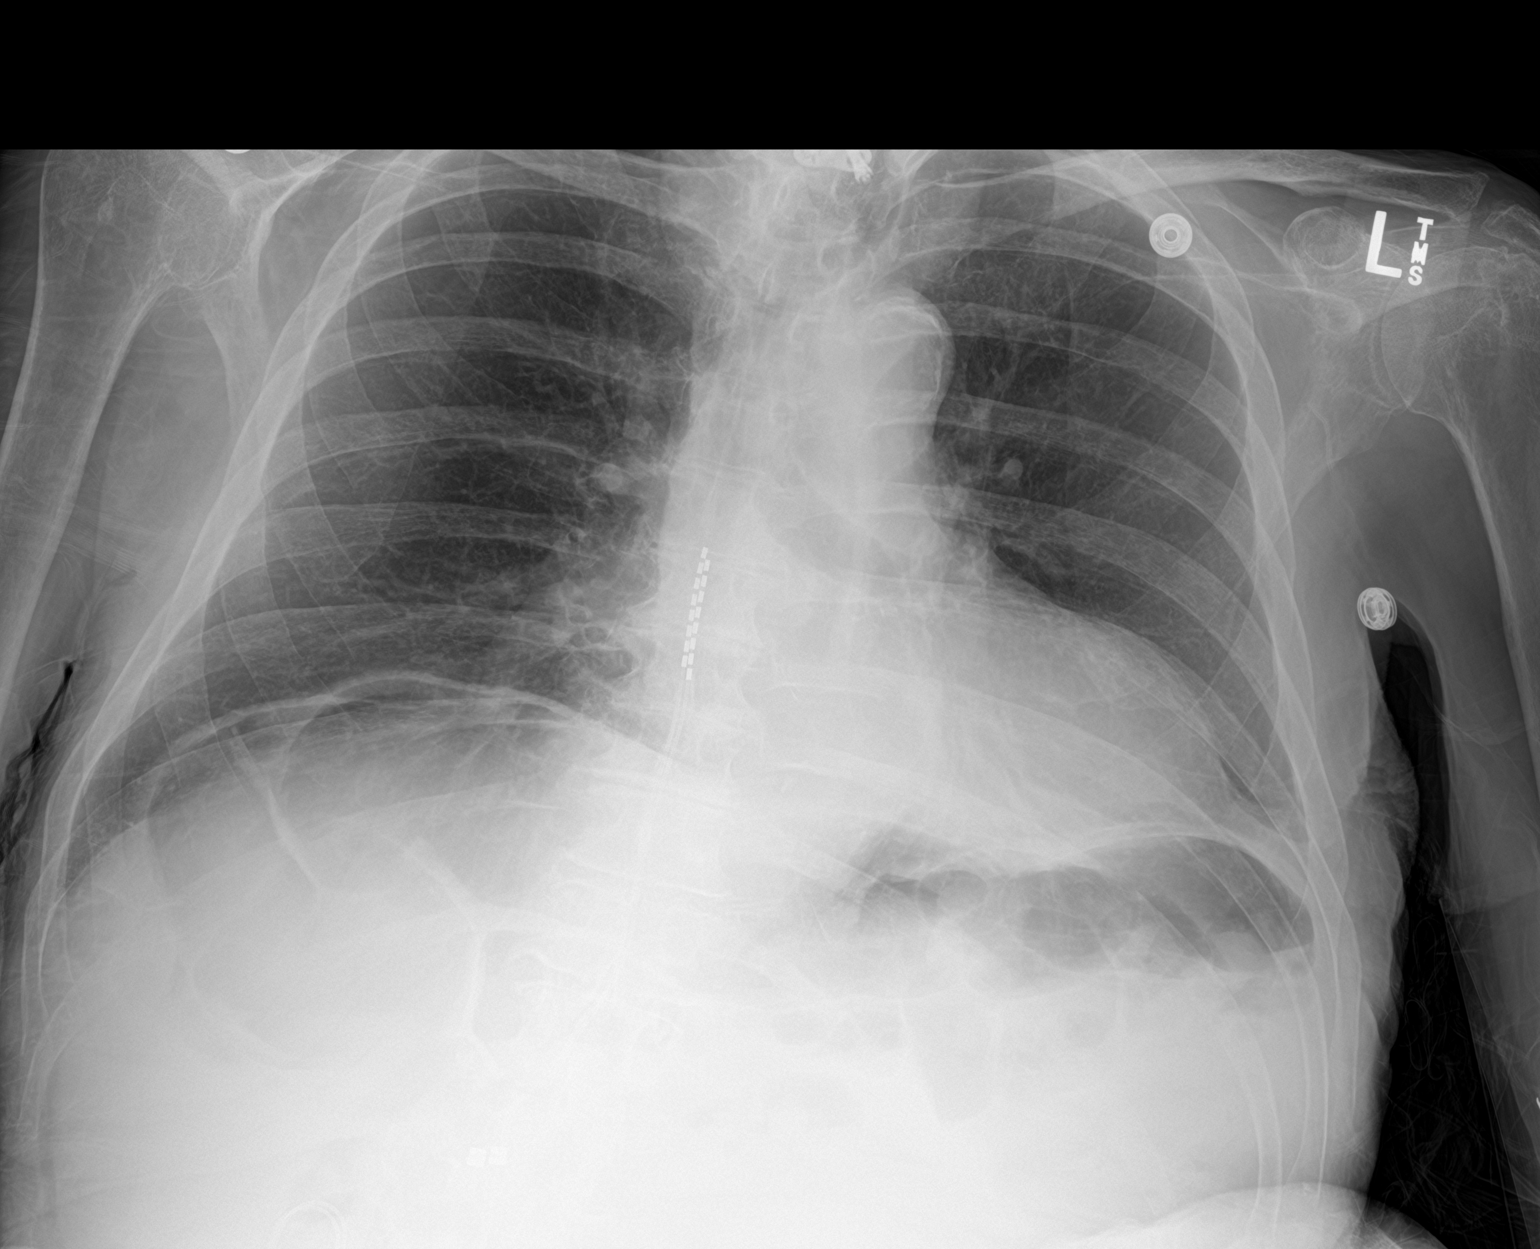

[2 of 2 positions shown; findings below may reference images not displayed]

FINDINGS: The lungs are clear. Heart size is normal. Aortic atherosclerosis.
No pneumothorax or pleural effusion. Cervical fusion hardware is
unchanged. Inferior most screw on the left has partially backed out.
Spinal stimulator noted.
IMPRESSION: No acute disease.

Aortic Atherosclerosis (LBTGI-OLW.W).

## 2021-06-15 IMAGING — DX DG CHEST 1V PORT
1 series · 1 of 1 positions shown · non-contrast
Comparison: July 08, 2020

CLINICAL DATA: Confusion/altered mental status

EXAM:
PORTABLE CHEST 1 VIEW

[chest ap]
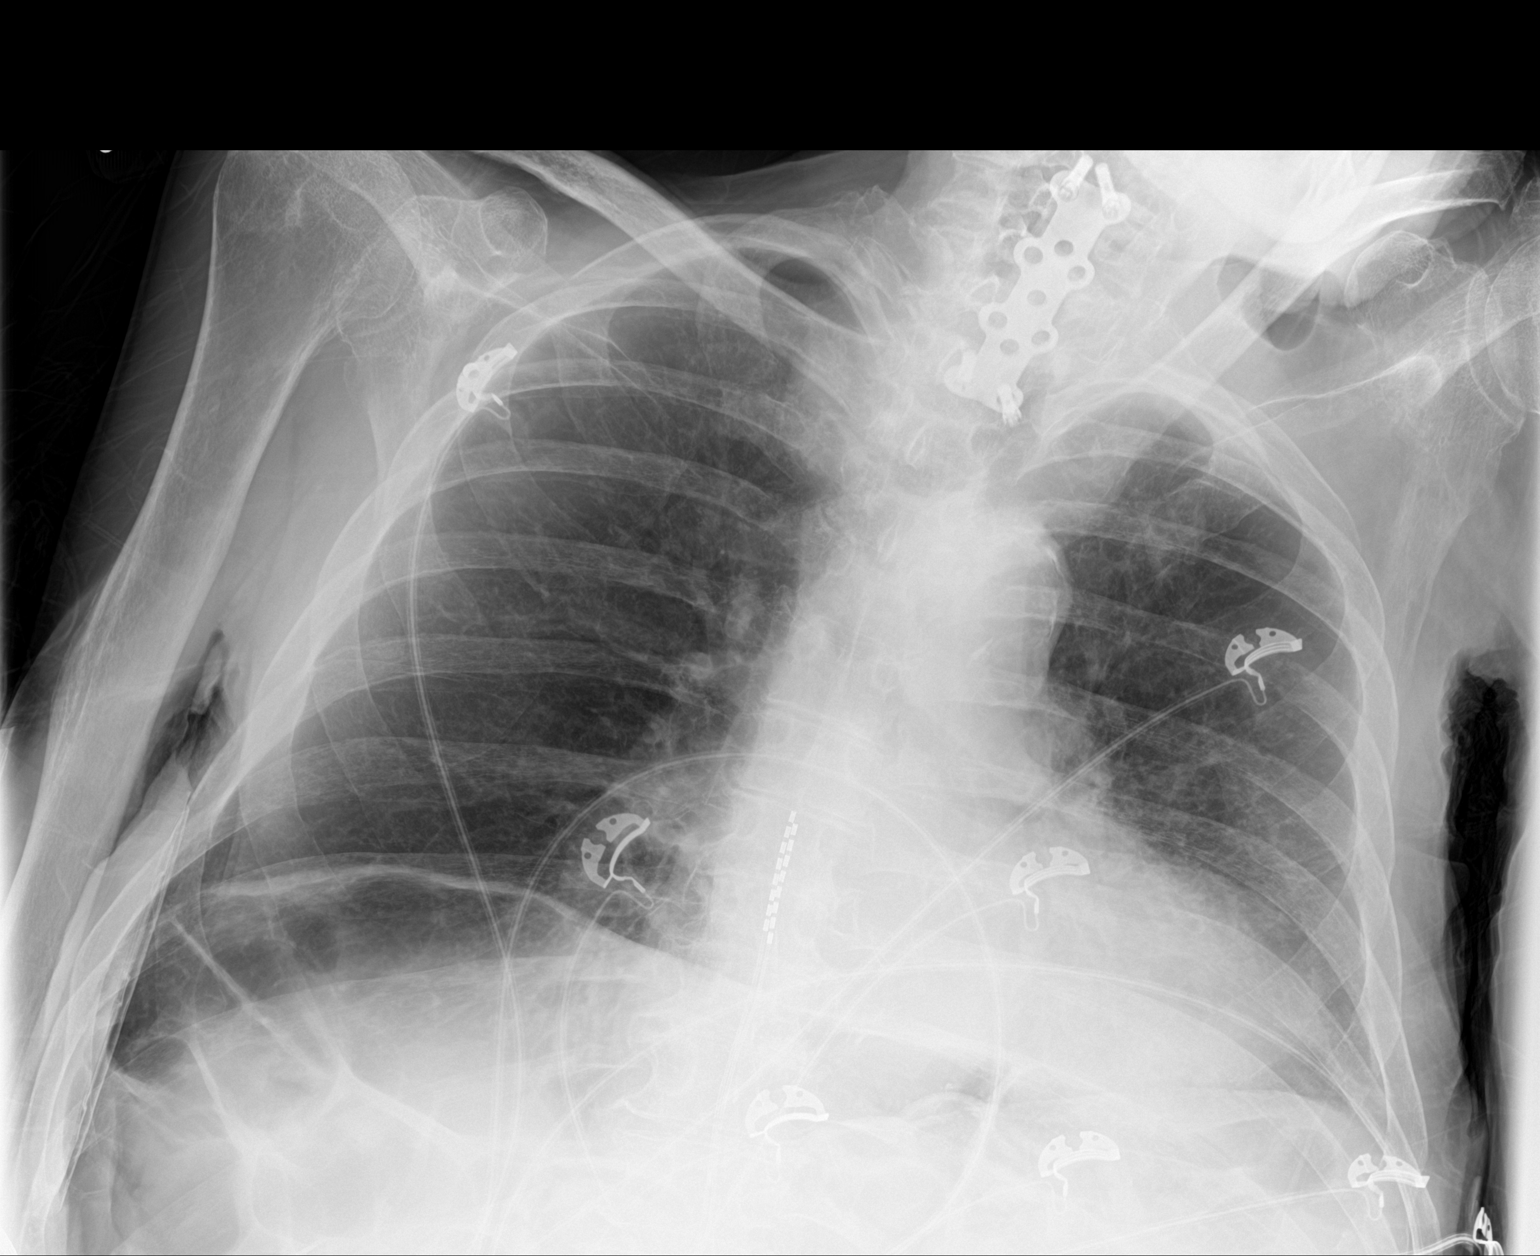

[1 of 1 positions shown; findings below may reference images not displayed]

FINDINGS: There is no edema or airspace opacity. Heart is mildly enlarged with
pulmonary vascularity normal. No adenopathy. Stimulator leads in
lower thoracic region, unchanged. Postoperative change in lower
cervical spine. There is aortic atherosclerosis. No bone lesions.
IMPRESSION: No edema or airspace opacity. Stable cardiac prominence
postoperative changes noted.

Aortic Atherosclerosis ([id number]-PHI.I).
